# Patient Record
Sex: Female | Born: 1940
Health system: Southern US, Community
[De-identification: ages and names within clinical notes are randomized; demographics above are authoritative.]

## PROBLEM LIST (undated history)

## (undated) DIAGNOSIS — K219 Gastro-esophageal reflux disease without esophagitis: Secondary | ICD-10-CM

## (undated) DIAGNOSIS — J189 Pneumonia, unspecified organism: Secondary | ICD-10-CM

## (undated) DIAGNOSIS — E785 Hyperlipidemia, unspecified: Secondary | ICD-10-CM

## (undated) DIAGNOSIS — M1990 Unspecified osteoarthritis, unspecified site: Secondary | ICD-10-CM

## (undated) DIAGNOSIS — I639 Cerebral infarction, unspecified: Secondary | ICD-10-CM

## (undated) DIAGNOSIS — G4730 Sleep apnea, unspecified: Secondary | ICD-10-CM

## (undated) DIAGNOSIS — R110 Nausea: Secondary | ICD-10-CM

## (undated) DIAGNOSIS — D649 Anemia, unspecified: Secondary | ICD-10-CM

## (undated) DIAGNOSIS — Z87442 Personal history of urinary calculi: Secondary | ICD-10-CM

## (undated) DIAGNOSIS — R1310 Dysphagia, unspecified: Secondary | ICD-10-CM

## (undated) DIAGNOSIS — Z8719 Personal history of other diseases of the digestive system: Secondary | ICD-10-CM

## (undated) DIAGNOSIS — R609 Edema, unspecified: Secondary | ICD-10-CM

## (undated) DIAGNOSIS — I10 Essential (primary) hypertension: Secondary | ICD-10-CM

## (undated) HISTORY — PX: CHOLECYSTECTOMY: SHX55

## (undated) HISTORY — PX: BREAST SURGERY: SHX581

## (undated) HISTORY — PX: CARPAL TUNNEL RELEASE: SHX101

## (undated) HISTORY — PX: ABDOMINAL HYSTERECTOMY: SHX81

## (undated) HISTORY — PX: APPENDECTOMY: SHX54

## (undated) HISTORY — PX: KYPHOPLASTY: SHX5884

## (undated) HISTORY — PX: BREAST BIOPSY: SHX20

## (undated) HISTORY — PX: REDUCTION MAMMAPLASTY: SUR839

## (undated) SURGERY — Surgical Case
Anesthesia: *Unknown

---

## 1998-12-24 ENCOUNTER — Other Ambulatory Visit: Admission: RE | Admit: 1998-12-24 | Discharge: 1998-12-24 | Payer: Self-pay | Admitting: *Deleted

## 1999-01-22 ENCOUNTER — Ambulatory Visit (HOSPITAL_COMMUNITY): Admission: RE | Admit: 1999-01-22 | Discharge: 1999-01-22 | Payer: Self-pay | Admitting: *Deleted

## 1999-04-17 ENCOUNTER — Ambulatory Visit (HOSPITAL_COMMUNITY): Admission: RE | Admit: 1999-04-17 | Discharge: 1999-04-17 | Payer: Self-pay | Admitting: Obstetrics & Gynecology

## 2000-01-21 ENCOUNTER — Other Ambulatory Visit: Admission: RE | Admit: 2000-01-21 | Discharge: 2000-01-21 | Payer: Self-pay | Admitting: *Deleted

## 2001-01-20 ENCOUNTER — Other Ambulatory Visit: Admission: RE | Admit: 2001-01-20 | Discharge: 2001-01-20 | Payer: Self-pay | Admitting: *Deleted

## 2001-01-23 ENCOUNTER — Encounter: Payer: Self-pay | Admitting: *Deleted

## 2001-01-23 ENCOUNTER — Encounter: Admission: RE | Admit: 2001-01-23 | Discharge: 2001-01-23 | Payer: Self-pay | Admitting: *Deleted

## 2002-01-24 ENCOUNTER — Encounter: Admission: RE | Admit: 2002-01-24 | Discharge: 2002-01-24 | Payer: Self-pay | Admitting: *Deleted

## 2002-01-24 ENCOUNTER — Encounter: Payer: Self-pay | Admitting: *Deleted

## 2002-01-30 ENCOUNTER — Other Ambulatory Visit: Admission: RE | Admit: 2002-01-30 | Discharge: 2002-01-30 | Payer: Self-pay | Admitting: *Deleted

## 2003-02-05 ENCOUNTER — Encounter: Admission: RE | Admit: 2003-02-05 | Discharge: 2003-02-05 | Payer: Self-pay | Admitting: Obstetrics & Gynecology

## 2003-02-05 ENCOUNTER — Encounter: Payer: Self-pay | Admitting: Obstetrics & Gynecology

## 2003-02-26 ENCOUNTER — Other Ambulatory Visit: Admission: RE | Admit: 2003-02-26 | Discharge: 2003-02-26 | Payer: Self-pay | Admitting: Obstetrics & Gynecology

## 2004-02-27 ENCOUNTER — Encounter: Admission: RE | Admit: 2004-02-27 | Discharge: 2004-02-27 | Payer: Self-pay | Admitting: Obstetrics & Gynecology

## 2004-03-06 ENCOUNTER — Other Ambulatory Visit: Admission: RE | Admit: 2004-03-06 | Discharge: 2004-03-06 | Payer: Self-pay | Admitting: Obstetrics & Gynecology

## 2005-03-04 ENCOUNTER — Encounter: Admission: RE | Admit: 2005-03-04 | Discharge: 2005-03-04 | Payer: Self-pay | Admitting: Obstetrics & Gynecology

## 2006-03-16 ENCOUNTER — Encounter: Admission: RE | Admit: 2006-03-16 | Discharge: 2006-03-16 | Payer: Self-pay | Admitting: Obstetrics & Gynecology

## 2006-04-29 ENCOUNTER — Ambulatory Visit: Payer: Self-pay | Admitting: Unknown Physician Specialty

## 2007-03-20 ENCOUNTER — Encounter: Admission: RE | Admit: 2007-03-20 | Discharge: 2007-03-20 | Payer: Self-pay | Admitting: Internal Medicine

## 2007-11-17 ENCOUNTER — Ambulatory Visit: Payer: Self-pay | Admitting: Internal Medicine

## 2007-12-14 ENCOUNTER — Ambulatory Visit: Payer: Self-pay | Admitting: Internal Medicine

## 2007-12-15 ENCOUNTER — Ambulatory Visit: Payer: Self-pay | Admitting: Internal Medicine

## 2008-04-22 ENCOUNTER — Ambulatory Visit: Payer: Self-pay | Admitting: Internal Medicine

## 2008-04-22 ENCOUNTER — Inpatient Hospital Stay: Payer: Self-pay | Admitting: Surgery

## 2008-05-07 ENCOUNTER — Encounter: Admission: RE | Admit: 2008-05-07 | Discharge: 2008-05-07 | Payer: Self-pay | Admitting: Obstetrics & Gynecology

## 2009-04-22 ENCOUNTER — Ambulatory Visit: Payer: Self-pay | Admitting: Specialist

## 2009-05-21 ENCOUNTER — Encounter: Admission: RE | Admit: 2009-05-21 | Discharge: 2009-05-21 | Payer: Self-pay | Admitting: Obstetrics & Gynecology

## 2009-06-06 ENCOUNTER — Ambulatory Visit: Payer: Self-pay | Admitting: Specialist

## 2010-04-03 ENCOUNTER — Ambulatory Visit: Payer: Self-pay | Admitting: Internal Medicine

## 2010-04-20 ENCOUNTER — Ambulatory Visit: Payer: Self-pay | Admitting: Unknown Physician Specialty

## 2010-05-13 ENCOUNTER — Ambulatory Visit: Payer: Self-pay | Admitting: Internal Medicine

## 2010-06-02 ENCOUNTER — Encounter: Admission: RE | Admit: 2010-06-02 | Discharge: 2010-06-02 | Payer: Self-pay | Admitting: Obstetrics & Gynecology

## 2010-06-04 ENCOUNTER — Ambulatory Visit: Payer: Self-pay | Admitting: Internal Medicine

## 2010-10-26 ENCOUNTER — Encounter: Payer: Self-pay | Admitting: Obstetrics & Gynecology

## 2011-03-25 ENCOUNTER — Emergency Department: Payer: Self-pay | Admitting: Internal Medicine

## 2011-04-30 ENCOUNTER — Other Ambulatory Visit: Payer: Self-pay | Admitting: Obstetrics & Gynecology

## 2011-04-30 DIAGNOSIS — Z1231 Encounter for screening mammogram for malignant neoplasm of breast: Secondary | ICD-10-CM

## 2011-06-16 ENCOUNTER — Ambulatory Visit: Payer: Self-pay

## 2011-06-18 ENCOUNTER — Ambulatory Visit: Payer: Self-pay

## 2011-07-07 ENCOUNTER — Other Ambulatory Visit: Payer: Self-pay | Admitting: Obstetrics & Gynecology

## 2011-07-07 DIAGNOSIS — N644 Mastodynia: Secondary | ICD-10-CM

## 2011-07-20 ENCOUNTER — Ambulatory Visit
Admission: RE | Admit: 2011-07-20 | Discharge: 2011-07-20 | Disposition: A | Discharge status: Home / Self Care | Payer: Medicare Other | Source: Ambulatory Visit | Attending: Obstetrics & Gynecology | Admitting: Obstetrics & Gynecology

## 2011-07-20 DIAGNOSIS — N644 Mastodynia: Secondary | ICD-10-CM

## 2012-04-25 ENCOUNTER — Ambulatory Visit: Payer: Self-pay | Admitting: Internal Medicine

## 2012-05-11 ENCOUNTER — Other Ambulatory Visit: Payer: Self-pay | Admitting: Internal Medicine

## 2012-05-11 DIAGNOSIS — N63 Unspecified lump in unspecified breast: Secondary | ICD-10-CM

## 2012-05-11 DIAGNOSIS — N6452 Nipple discharge: Secondary | ICD-10-CM

## 2012-05-22 ENCOUNTER — Other Ambulatory Visit: Payer: Medicare Other

## 2012-05-24 ENCOUNTER — Other Ambulatory Visit: Payer: Medicare Other

## 2012-05-24 ENCOUNTER — Ambulatory Visit
Admission: RE | Admit: 2012-05-24 | Discharge: 2012-05-24 | Disposition: A | Discharge status: Home / Self Care | Payer: Medicare Other | Source: Ambulatory Visit | Attending: Internal Medicine | Admitting: Internal Medicine

## 2012-05-24 DIAGNOSIS — N6452 Nipple discharge: Secondary | ICD-10-CM

## 2012-05-24 DIAGNOSIS — N63 Unspecified lump in unspecified breast: Secondary | ICD-10-CM

## 2012-06-20 ENCOUNTER — Other Ambulatory Visit: Payer: Self-pay | Admitting: Obstetrics & Gynecology

## 2012-06-20 DIAGNOSIS — Z1231 Encounter for screening mammogram for malignant neoplasm of breast: Secondary | ICD-10-CM

## 2012-08-02 ENCOUNTER — Ambulatory Visit
Admission: RE | Admit: 2012-08-02 | Discharge: 2012-08-02 | Disposition: A | Discharge status: Home / Self Care | Payer: Medicare Other | Source: Ambulatory Visit | Attending: Obstetrics & Gynecology | Admitting: Obstetrics & Gynecology

## 2012-08-02 DIAGNOSIS — Z1231 Encounter for screening mammogram for malignant neoplasm of breast: Secondary | ICD-10-CM

## 2012-08-03 ENCOUNTER — Other Ambulatory Visit: Payer: Self-pay | Admitting: Obstetrics & Gynecology

## 2012-08-03 DIAGNOSIS — R234 Changes in skin texture: Secondary | ICD-10-CM

## 2012-08-09 ENCOUNTER — Ambulatory Visit
Admission: RE | Admit: 2012-08-09 | Discharge: 2012-08-09 | Disposition: A | Discharge status: Home / Self Care | Payer: Medicare Other | Source: Ambulatory Visit | Attending: Obstetrics & Gynecology | Admitting: Obstetrics & Gynecology

## 2012-08-09 DIAGNOSIS — R234 Changes in skin texture: Secondary | ICD-10-CM

## 2013-02-17 ENCOUNTER — Emergency Department: Payer: Self-pay | Admitting: Emergency Medicine

## 2013-02-17 LAB — CBC
HGB: 13.2 g/dL (ref 12.0–16.0)
MCHC: 33.6 g/dL (ref 32.0–36.0)
Platelet: 211 10*3/uL (ref 150–440)
RDW: 14 % (ref 11.5–14.5)
WBC: 14.4 10*3/uL — ABNORMAL HIGH (ref 3.6–11.0)

## 2013-02-17 LAB — COMPREHENSIVE METABOLIC PANEL
Albumin: 3.6 g/dL (ref 3.4–5.0)
BUN: 12 mg/dL (ref 7–18)
Bilirubin,Total: 0.7 mg/dL (ref 0.2–1.0)
Co2: 27 mmol/L (ref 21–32)
Creatinine: 0.78 mg/dL (ref 0.60–1.30)
EGFR (African American): >60
Osmolality: 287 (ref 275–301)

## 2013-02-17 LAB — URINALYSIS, COMPLETE
Bilirubin,UR: NEGATIVE
Glucose,UR: NEGATIVE mg/dL (ref 0–75)
Protein: NEGATIVE
RBC,UR: 1 /HPF (ref 0–5)
Specific Gravity: 1.004 (ref 1.003–1.030)

## 2013-02-17 LAB — TROPONIN I
Troponin-I: <0.02 ng/mL
Troponin-I: <0.02 ng/mL

## 2013-07-10 ENCOUNTER — Other Ambulatory Visit: Payer: Self-pay

## 2013-07-10 DIAGNOSIS — Z1231 Encounter for screening mammogram for malignant neoplasm of breast: Secondary | ICD-10-CM

## 2013-08-13 ENCOUNTER — Ambulatory Visit
Admission: RE | Admit: 2013-08-13 | Discharge: 2013-08-13 | Disposition: A | Discharge status: Home / Self Care | Payer: Medicare Other | Source: Ambulatory Visit

## 2013-08-13 DIAGNOSIS — Z1231 Encounter for screening mammogram for malignant neoplasm of breast: Secondary | ICD-10-CM

## 2013-12-13 ENCOUNTER — Ambulatory Visit: Payer: Self-pay | Admitting: Internal Medicine

## 2014-01-28 ENCOUNTER — Ambulatory Visit: Payer: Self-pay | Admitting: Unknown Physician Specialty

## 2014-01-30 LAB — PATHOLOGY REPORT

## 2014-05-30 DIAGNOSIS — M810 Age-related osteoporosis without current pathological fracture: Secondary | ICD-10-CM | POA: Insufficient documentation

## 2014-10-21 ENCOUNTER — Other Ambulatory Visit: Payer: Self-pay

## 2014-10-21 DIAGNOSIS — Z1231 Encounter for screening mammogram for malignant neoplasm of breast: Secondary | ICD-10-CM

## 2014-10-25 ENCOUNTER — Ambulatory Visit: Payer: Medicare Other

## 2015-08-11 ENCOUNTER — Other Ambulatory Visit: Payer: Self-pay | Admitting: Internal Medicine

## 2015-08-11 DIAGNOSIS — D500 Iron deficiency anemia secondary to blood loss (chronic): Secondary | ICD-10-CM

## 2015-08-11 DIAGNOSIS — R222 Localized swelling, mass and lump, trunk: Secondary | ICD-10-CM

## 2015-08-14 ENCOUNTER — Ambulatory Visit
Admission: RE | Admit: 2015-08-14 | Discharge: 2015-08-14 | Disposition: A | Discharge status: Home / Self Care | Payer: Medicare Other | Source: Ambulatory Visit | Attending: Internal Medicine | Admitting: Internal Medicine

## 2015-08-14 DIAGNOSIS — D500 Iron deficiency anemia secondary to blood loss (chronic): Secondary | ICD-10-CM | POA: Diagnosis present

## 2015-08-14 DIAGNOSIS — R222 Localized swelling, mass and lump, trunk: Secondary | ICD-10-CM | POA: Diagnosis present

## 2015-08-14 DIAGNOSIS — K449 Diaphragmatic hernia without obstruction or gangrene: Secondary | ICD-10-CM | POA: Insufficient documentation

## 2015-08-14 DIAGNOSIS — I728 Aneurysm of other specified arteries: Secondary | ICD-10-CM | POA: Insufficient documentation

## 2015-08-14 DIAGNOSIS — I2510 Atherosclerotic heart disease of native coronary artery without angina pectoris: Secondary | ICD-10-CM | POA: Insufficient documentation

## 2015-08-14 HISTORY — DX: Essential (primary) hypertension: I10

## 2015-08-14 MED ORDER — IOHEXOL 300 MG/ML  SOLN
75.0000 mL | Freq: Once | INTRAMUSCULAR | Status: AC | PRN
  Administered 2015-08-14: 75 mL via INTRAVENOUS

## 2015-08-27 ENCOUNTER — Encounter: Payer: Self-pay | Admitting: *Deleted

## 2015-09-01 ENCOUNTER — Encounter
Admission: RE | Disposition: A | Discharge status: Home / Self Care | Payer: Self-pay | Source: Ambulatory Visit | Attending: Unknown Physician Specialty

## 2015-09-01 ENCOUNTER — Encounter: Payer: Self-pay | Admitting: *Deleted

## 2015-09-01 ENCOUNTER — Ambulatory Visit
Admission: RE | Admit: 2015-09-01 | Discharge: 2015-09-01 | Disposition: A | Discharge status: Home / Self Care | Payer: Medicare Other | Source: Ambulatory Visit | Attending: Unknown Physician Specialty | Admitting: Unknown Physician Specialty

## 2015-09-01 ENCOUNTER — Ambulatory Visit: Payer: Medicare Other | Admitting: Anesthesiology

## 2015-09-01 DIAGNOSIS — Z823 Family history of stroke: Secondary | ICD-10-CM | POA: Diagnosis not present

## 2015-09-01 DIAGNOSIS — M1710 Unilateral primary osteoarthritis, unspecified knee: Secondary | ICD-10-CM | POA: Insufficient documentation

## 2015-09-01 DIAGNOSIS — K2900 Acute gastritis without bleeding: Secondary | ICD-10-CM | POA: Insufficient documentation

## 2015-09-01 DIAGNOSIS — Z87442 Personal history of urinary calculi: Secondary | ICD-10-CM | POA: Diagnosis not present

## 2015-09-01 DIAGNOSIS — M810 Age-related osteoporosis without current pathological fracture: Secondary | ICD-10-CM | POA: Insufficient documentation

## 2015-09-01 DIAGNOSIS — D509 Iron deficiency anemia, unspecified: Secondary | ICD-10-CM | POA: Diagnosis present

## 2015-09-01 DIAGNOSIS — K210 Gastro-esophageal reflux disease with esophagitis: Secondary | ICD-10-CM | POA: Insufficient documentation

## 2015-09-01 DIAGNOSIS — Z880 Allergy status to penicillin: Secondary | ICD-10-CM | POA: Diagnosis not present

## 2015-09-01 DIAGNOSIS — Z818 Family history of other mental and behavioral disorders: Secondary | ICD-10-CM | POA: Insufficient documentation

## 2015-09-01 DIAGNOSIS — R1310 Dysphagia, unspecified: Secondary | ICD-10-CM | POA: Diagnosis present

## 2015-09-01 DIAGNOSIS — E785 Hyperlipidemia, unspecified: Secondary | ICD-10-CM | POA: Insufficient documentation

## 2015-09-01 DIAGNOSIS — Z9049 Acquired absence of other specified parts of digestive tract: Secondary | ICD-10-CM | POA: Insufficient documentation

## 2015-09-01 DIAGNOSIS — Z79899 Other long term (current) drug therapy: Secondary | ICD-10-CM | POA: Diagnosis not present

## 2015-09-01 DIAGNOSIS — K2950 Unspecified chronic gastritis without bleeding: Secondary | ICD-10-CM | POA: Diagnosis not present

## 2015-09-01 DIAGNOSIS — Z9889 Other specified postprocedural states: Secondary | ICD-10-CM | POA: Diagnosis not present

## 2015-09-01 DIAGNOSIS — K449 Diaphragmatic hernia without obstruction or gangrene: Secondary | ICD-10-CM | POA: Insufficient documentation

## 2015-09-01 DIAGNOSIS — Z888 Allergy status to other drugs, medicaments and biological substances status: Secondary | ICD-10-CM | POA: Diagnosis not present

## 2015-09-01 DIAGNOSIS — K222 Esophageal obstruction: Secondary | ICD-10-CM | POA: Insufficient documentation

## 2015-09-01 DIAGNOSIS — Z8042 Family history of malignant neoplasm of prostate: Secondary | ICD-10-CM | POA: Diagnosis not present

## 2015-09-01 DIAGNOSIS — Z8262 Family history of osteoporosis: Secondary | ICD-10-CM | POA: Diagnosis not present

## 2015-09-01 DIAGNOSIS — Z90710 Acquired absence of both cervix and uterus: Secondary | ICD-10-CM | POA: Diagnosis not present

## 2015-09-01 DIAGNOSIS — Z882 Allergy status to sulfonamides status: Secondary | ICD-10-CM | POA: Insufficient documentation

## 2015-09-01 DIAGNOSIS — M1990 Unspecified osteoarthritis, unspecified site: Secondary | ICD-10-CM | POA: Diagnosis not present

## 2015-09-01 DIAGNOSIS — I10 Essential (primary) hypertension: Secondary | ICD-10-CM | POA: Diagnosis not present

## 2015-09-01 DIAGNOSIS — J302 Other seasonal allergic rhinitis: Secondary | ICD-10-CM | POA: Diagnosis not present

## 2015-09-01 DIAGNOSIS — R110 Nausea: Secondary | ICD-10-CM | POA: Diagnosis present

## 2015-09-01 HISTORY — DX: Nausea: R11.0

## 2015-09-01 HISTORY — DX: Unspecified osteoarthritis, unspecified site: M19.90

## 2015-09-01 HISTORY — DX: Personal history of other diseases of the digestive system: Z87.19

## 2015-09-01 HISTORY — DX: Gastro-esophageal reflux disease without esophagitis: K21.9

## 2015-09-01 HISTORY — DX: Dysphagia, unspecified: R13.10

## 2015-09-01 HISTORY — DX: Hyperlipidemia, unspecified: E78.5

## 2015-09-01 HISTORY — PX: COLONOSCOPY WITH PROPOFOL: SHX5780

## 2015-09-01 HISTORY — PX: SAVORY DILATION: SHX5439

## 2015-09-01 SURGERY — EGD, WITH DILATION USING SAVARY-GILLIARD DILATOR OVER GUIDEWIRE
Anesthesia: General

## 2015-09-01 MED ORDER — FENTANYL CITRATE (PF) 100 MCG/2ML IJ SOLN
INTRAMUSCULAR | Status: DC | PRN
  Administered 2015-09-01: 50 ug via INTRAVENOUS

## 2015-09-01 MED ORDER — SODIUM CHLORIDE 0.9 % IV SOLN: INTRAVENOUS | Status: DC

## 2015-09-01 MED ORDER — PROPOFOL 500 MG/50ML IV EMUL
INTRAVENOUS | Status: DC | PRN
  Administered 2015-09-01: 125 ug/kg/min via INTRAVENOUS

## 2015-09-01 MED ORDER — GLYCOPYRROLATE 0.2 MG/ML IJ SOLN
INTRAMUSCULAR | Status: DC | PRN
  Administered 2015-09-01: 0.2 mg via INTRAVENOUS

## 2015-09-01 MED ORDER — SODIUM CHLORIDE 0.9 % IV SOLN
INTRAVENOUS | Status: DC
  Administered 2015-09-01: 1000 mL via INTRAVENOUS

## 2015-09-01 MED ORDER — BUTAMBEN-TETRACAINE-BENZOCAINE 2-2-14 % EX AERO
INHALATION_SPRAY | CUTANEOUS | Status: DC | PRN
  Administered 2015-09-01: 1 via TOPICAL

## 2015-09-01 MED ORDER — PROPOFOL 10 MG/ML IV BOLUS
INTRAVENOUS | Status: DC | PRN
  Administered 2015-09-01: 80 mg via INTRAVENOUS

## 2015-09-01 NOTE — Op Note (Signed)
[facility] Patient Name: Cheryl CarwinBeatrice Montee Procedure Date: 09/01/2015 10:48 AM MRN: [id number] Account #: [id number] Date of Birth: 01/22/1941 Admit Type: Outpatient Age: [age] [facility]: [facility] Gender: Female Note Status: Finalized Procedure:         Upper GI endoscopy Indications:       Dysphagia Providers:         Scot Junobert T. Sylvan Sookdeo, MD Referring MD:      Danella PentonMark F. Miller, MD (Referring MD) Medicines:         Propofol per Anesthesia Complications:     No immediate complications. Procedure:         Pre-Anesthesia Assessment:                    - After reviewing the risks and benefits, the patient was                     deemed in satisfactory condition to undergo the procedure.                    After obtaining informed consent, the endoscope was passed                     under direct vision. Throughout the procedure, the                     patient's blood pressure, pulse, and oxygen saturations                     were monitored continuously. The Endoscope was introduced                     through the mouth, and advanced to the second part of                     duodenum. The upper GI endoscopy was accomplished without                     difficulty. The patient tolerated the procedure well. Findings:      A mild Schatzki ring (acquired) was found at the gastroesophageal       junction. A guidewire was placed and the scope was withdrawn. Dilation       was performed with a Savary dilator with mild resistance at 16 mm. GEJ       33cm.      A medium-sized hiatus hernia was present.      Patchy mild inflammation characterized by erythema and granularity was       found in the gastric antrum. Biopsies were taken with a cold forceps for       histology. Biopsies were taken with a cold forceps for Helicobacter       pylori testing.      The examined duodenum was normal. Impression:        - Mild Schatzki ring. Dilated.                    -  Medium-sized hiatus hernia.                    - Gastritis. Biopsied.                    - Normal examined duodenum. Recommendation:    - Await pathology results.                    -  soft food for 3 days, eat slowly, chew well, take small                     bites Scot Jun, MD 09/01/2015 11:01:14 AM This report has been signed electronically. Number of Addenda: 0 Note Initiated On: 09/01/2015 10:48 AM      [facility]

## 2015-09-01 NOTE — Anesthesia Preprocedure Evaluation (Signed)
Anesthesia Evaluation  Patient identified by MRN, date of birth, ID band Patient awake    Reviewed: Allergy & Precautions, H&P , NPO status , Patient's Chart, lab work & pertinent test results  History of Anesthesia Complications Negative for: history of anesthetic complications  Airway Mallampati: II  TM Distance: >3 FB Neck ROM: full    Dental no notable dental hx. (+) Teeth Intact   Pulmonary neg pulmonary ROS, neg shortness of breath,    Pulmonary exam normal breath sounds clear to auscultation       Cardiovascular Exercise Tolerance: Good hypertension, (-) angina(-) Past MI and (-) DOE Normal cardiovascular exam Rhythm:regular Rate:Normal     Neuro/Psych negative neurological ROS  negative psych ROS   GI/Hepatic Neg liver ROS, hiatal hernia, GERD  Controlled,  Endo/Other  negative endocrine ROS  Renal/GU negative Renal ROS  negative genitourinary   Musculoskeletal   Abdominal   Peds  Hematology negative hematology ROS (+)   Anesthesia Other Findings Past Medical History:   Hypertension                                                 Dysphagia                                                    Nausea                                                       GERD (gastroesophageal reflux disease)                       Hyperlipidemia                                               Arthritis                                                    History of hiatal hernia                                    Past Surgical History:   ABDOMINAL HYSTERECTOMY                                        APPENDECTOMY                                                  CHOLECYSTECTOMY  BREAST SURGERY                                                CARPAL TUNNEL RELEASE                                        BMI    Body Mass Index   34.31 kg/m 2    Patient reports that she does that think  that any food or pills are stuck in her throat at this time.   Signs and symptoms suggestive of sleep apnea    Reproductive/Obstetrics negative OB ROS                             Anesthesia Physical Anesthesia Plan  ASA: III  Anesthesia Plan: General   Post-op Pain Management:    Induction:   Airway Management Planned:   Additional Equipment:   Intra-op Plan:   Post-operative Plan:   Informed Consent: I have reviewed the patients History and Physical, chart, labs and discussed the procedure including the risks, benefits and alternatives for the proposed anesthesia with the patient or authorized representative who has indicated his/her understanding and acceptance.   Dental Advisory Given  Plan Discussed with: Anesthesiologist, CRNA and Surgeon  Anesthesia Plan Comments:         Anesthesia Quick Evaluation

## 2015-09-01 NOTE — Transfer of Care (Signed)
Immediate Anesthesia Transfer of Care Note  Patient: Cheryl Goodman  Procedure(s) Performed: Procedure(s): SAVORY DILATION (N/A) COLONOSCOPY WITH PROPOFOL (N/A)  Patient Location: PACU  Anesthesia Type:General  Level of Consciousness: awake, alert , oriented and patient cooperative  Airway & Oxygen Therapy: Patient Spontanous Breathing and Patient connected to nasal cannula oxygen  Post-op Assessment: Report given to RN, Post -op Vital signs reviewed and stable and Patient moving all extremities  Post vital signs: Reviewed and stable  Last Vitals:  Filed Vitals:   09/01/15 1013 09/01/15 1105  BP: 144/89 127/76  Pulse: 77 78  Temp: 36.6 C 36.4 C  Resp: 18 20    Complications: No apparent anesthesia complications

## 2015-09-01 NOTE — H&P (Signed)
   Primary Care Physician:  MILLER,MARK F., MD Primary Gastroenterologist:  Dr. Mechele CollDanella PentoninElliott  Pre-Procedure History & Physical: HPI:  Cheryl Goodman is a 74 y.o. female is here for an endoscopy.   Past Medical History  Diagnosis Date  . Hypertension   . Dysphagia   . Nausea   . GERD (gastroesophageal reflux disease)   . Hyperlipidemia   . Arthritis   . History of hiatal hernia     Past Surgical History  Procedure Laterality Date  . Abdominal hysterectomy    . Appendectomy    . Cholecystectomy    . Breast surgery    . Carpal tunnel release      Prior to Admission medications   Medication Sig Start Date End Date Taking? Authorizing Provider  Biotin 1000 MCG tablet Take 1,000 mcg by mouth daily.   Yes Historical Provider, MD  cyanocobalamin 1000 MCG tablet Take 3,000 mcg by mouth daily.   Yes Historical Provider, MD  ferrous fumarate (HEMOCYTE - 106 MG FE) 325 (106 FE) MG TABS tablet Take 1 tablet by mouth daily.   Yes Historical Provider, MD  lisinopril-hydrochlorothiazide (PRINZIDE,ZESTORETIC) 20-12.5 MG tablet Take 1 tablet by mouth daily.   Yes Historical Provider, MD  pantoprazole (PROTONIX) 40 MG tablet Take 40 mg by mouth daily.   Yes Historical Provider, MD  ranitidine (ZANTAC) 150 MG capsule Take 150 mg by mouth daily.   Yes Historical Provider, MD  traMADol (ULTRAM) 50 MG tablet Take 50 mg by mouth 2 (two) times daily as needed.   Yes Historical Provider, MD  zoledronic acid (RECLAST) 5 MG/100ML SOLN injection Inject 5 mg into the vein See admin instructions.   Yes Historical Provider, MD    Allergies as of 08/03/2015  . (No Known Allergies)    History reviewed. No pertinent family history.  Social History   Social History  . Marital Status: Married    Spouse Name: N/A  . Number of Children: N/A  . Years of Education: N/A   Occupational History  . Not on file.   Social History Main Topics  . Smoking status: Never Smoker   . Smokeless tobacco: Never Used   . Alcohol Use: No  . Drug Use: No  . Sexual Activity: Not on file   Other Topics Concern  . Not on file   Social History Narrative    Review of Systems: See HPI, otherwise negative ROS  Physical Exam: BP 144/89 mmHg  Pulse 77  Temp(Src) 97.8 F (36.6 C) (Oral)  Resp 18  Ht 4\' 11"  (1.499 m)  Wt 77.111 kg (170 lb)  BMI 34.32 kg/m2  SpO2 99% General:   Alert,  pleasant and cooperative in NAD Head:  Normocephalic and atraumatic. Neck:  Supple; no masses or thyromegaly. Lungs:  Clear throughout to auscultation.    Heart:  Regular rate and rhythm. Abdomen:  Soft, nontender and nondistended. Normal bowel sounds, without guarding, and without rebound.   Neurologic:  Alert and  oriented x4;  grossly normal neurologically.  Impression/Plan: Cheryl Goodman is here for an endoscopy to be performed for dysphagia  Risks, benefits, limitations, and alternatives regarding  endoscopy have been reviewed with the patient.  Questions have been answered.  All parties agreeable.   Lynnae PrudeELLIOTT, Jemimah Cressy, MD  09/01/2015, 10:44 AM

## 2015-09-01 NOTE — Anesthesia Postprocedure Evaluation (Signed)
Anesthesia Post Note  Patient: Cheryl Goodman  Procedure(s) Performed: Procedure(s) (LRB): SAVORY DILATION (N/A) COLONOSCOPY WITH PROPOFOL (N/A)  Patient location during evaluation: Endoscopy Anesthesia Type: General Level of consciousness: awake and alert Pain management: pain level controlled Vital Signs Assessment: post-procedure vital signs reviewed and stable Respiratory status: spontaneous breathing, nonlabored ventilation, respiratory function stable and patient connected to nasal cannula oxygen Cardiovascular status: blood pressure returned to baseline and stable Postop Assessment: No signs of nausea or vomiting Anesthetic complications: no    Last Vitals:  Filed Vitals:   09/01/15 1125 09/01/15 1135  BP: 137/94 132/79  Pulse: 100 80  Temp:    Resp: 14 17    Last Pain: There were no vitals filed for this visit.               Cleda MccreedyJoseph K Piscitello

## 2015-09-02 LAB — SURGICAL PATHOLOGY

## 2015-09-03 ENCOUNTER — Encounter: Payer: Self-pay | Admitting: Unknown Physician Specialty

## 2016-02-26 ENCOUNTER — Other Ambulatory Visit: Payer: Self-pay | Admitting: Obstetrics & Gynecology

## 2016-02-26 DIAGNOSIS — N644 Mastodynia: Secondary | ICD-10-CM

## 2016-02-26 DIAGNOSIS — N63 Unspecified lump in unspecified breast: Secondary | ICD-10-CM

## 2016-03-04 ENCOUNTER — Ambulatory Visit
Admission: RE | Admit: 2016-03-04 | Discharge: 2016-03-04 | Disposition: A | Discharge status: Home / Self Care | Payer: Medicare Other | Source: Ambulatory Visit | Attending: Obstetrics & Gynecology | Admitting: Obstetrics & Gynecology

## 2016-03-04 DIAGNOSIS — N644 Mastodynia: Secondary | ICD-10-CM

## 2016-03-04 DIAGNOSIS — N63 Unspecified lump in unspecified breast: Secondary | ICD-10-CM

## 2016-06-03 DIAGNOSIS — D500 Iron deficiency anemia secondary to blood loss (chronic): Secondary | ICD-10-CM | POA: Insufficient documentation

## 2017-05-12 IMAGING — CT CT CHEST W/ CM
2 of 3 series · 15 of 36 positions shown, 18 images · IV contrast (omnipaque)
Comparison: Chest radiograph - 02/17/2013

CLINICAL DATA: Left supraclavicular mass for the past 1-2 months.
Patient's palpable area of concern marked with a vitamin tablet.

EXAM:
CT CHEST WITH CONTRAST
TECHNIQUE: Multidetector CT imaging of the chest was performed during
intravenous contrast administration.
CONTRAST:  75mL OMNIPAQUE IOHEXOL 300 MG/ML  SOLN

[Series 2: routine chest with · axial · 0.67mm/px · z∈[-688,-403]mm · 12 of 67 slices shown, 15 images]
[im 5/67  mediastinal]
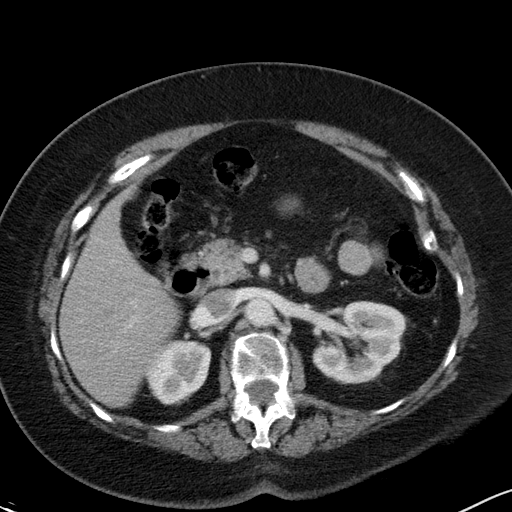
[im 5/67  lung]
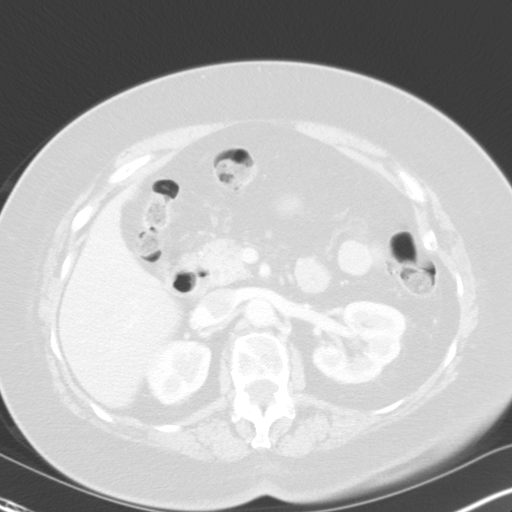
[im 10/67  lung]
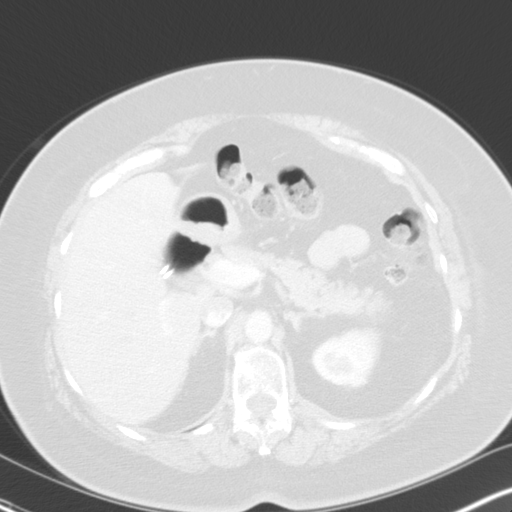
[im 15/67  lung]
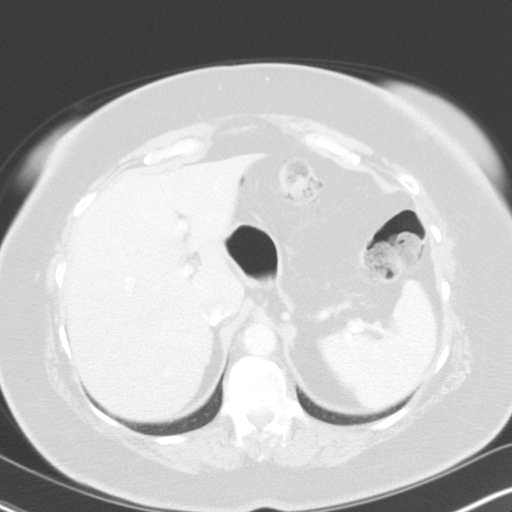
[im 20/67  lung]
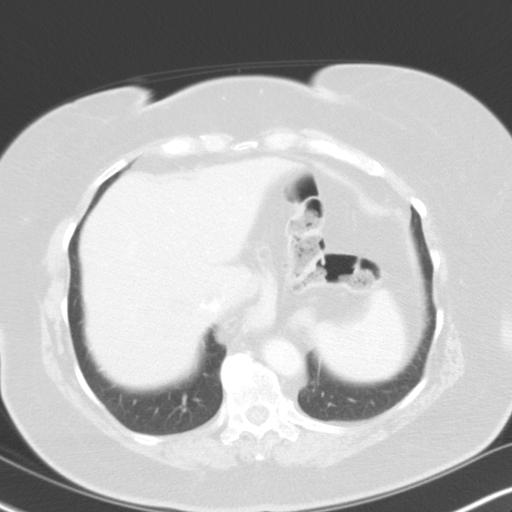
[im 25/67  mediastinal]
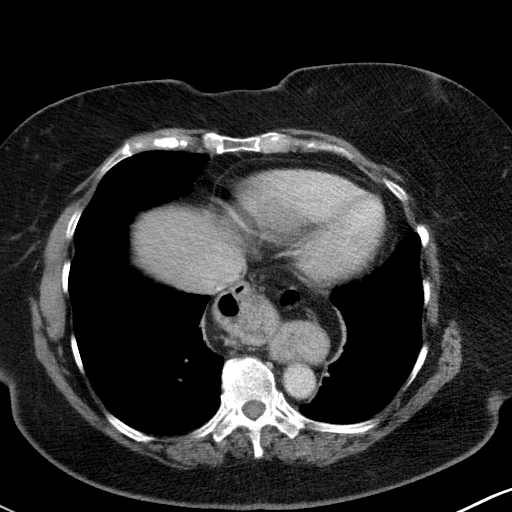
[im 25/67  lung]
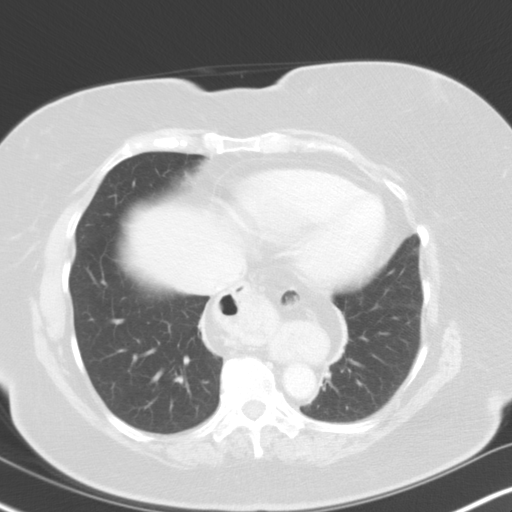
[im 30/67  lung]
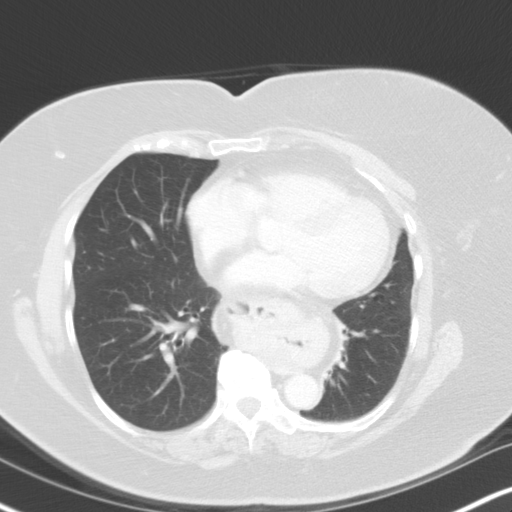
[im 37/67  lung]
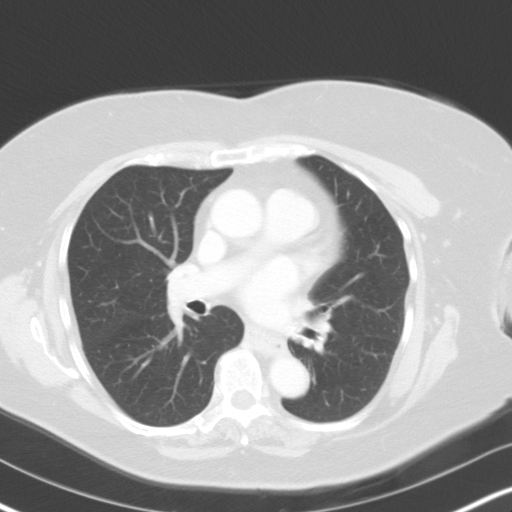
[im 42/67  lung]
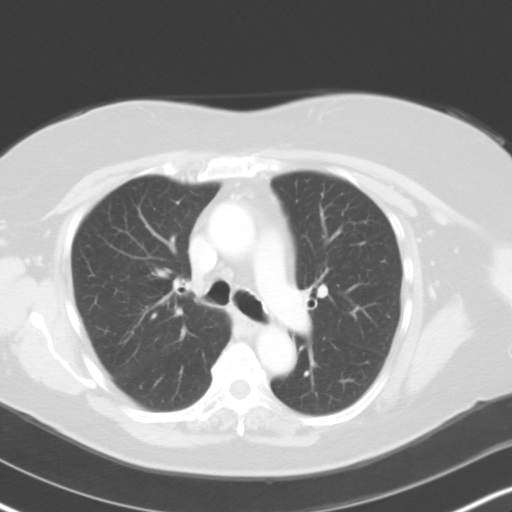
[im 47/67  mediastinal]
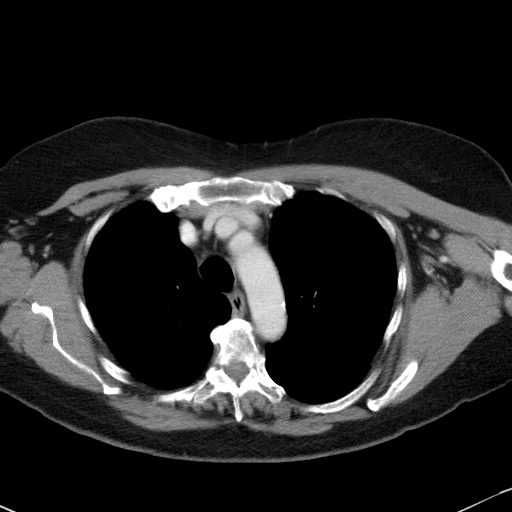
[im 47/67  lung]
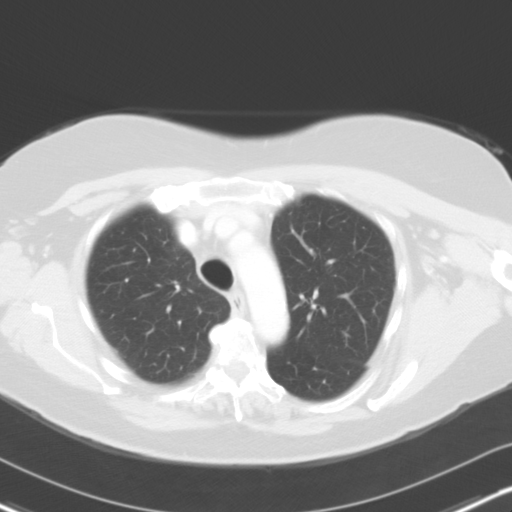
[im 52/67  lung]
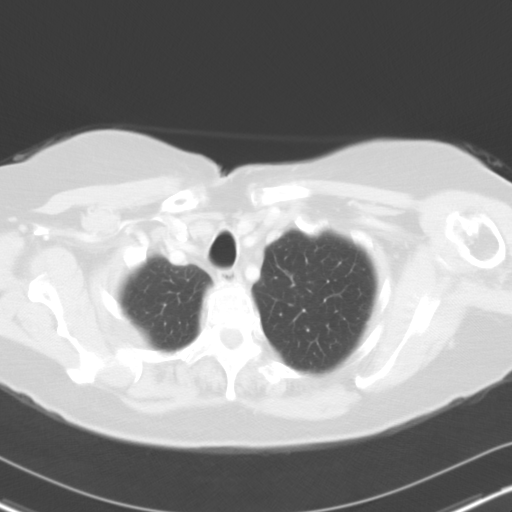
[im 57/67  lung]
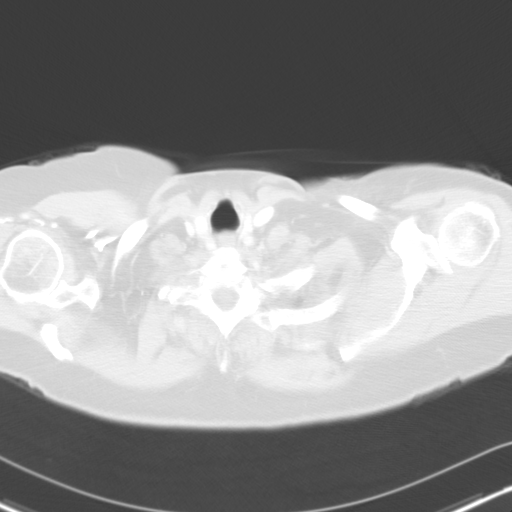
[im 62/67  lung]
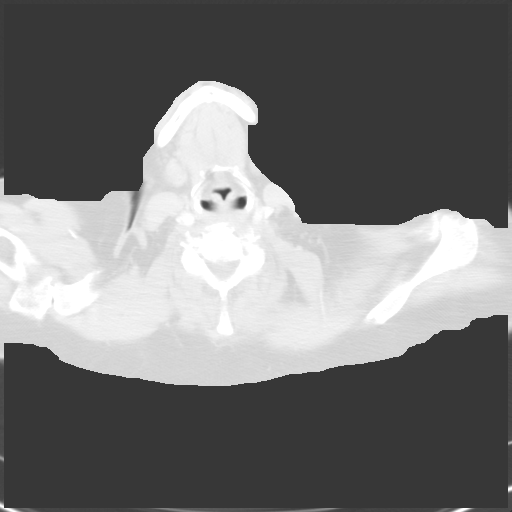

[Series 5: cor routine chest with · coronal · 0.66mm/px · 3 of 147 slices shown]
[im 30/147  lung]
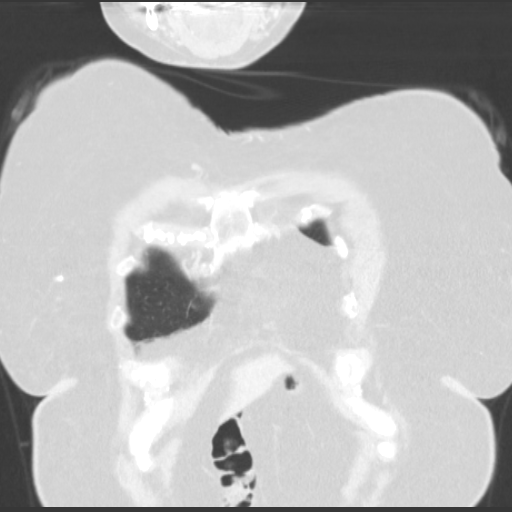
[im 59/147  lung]
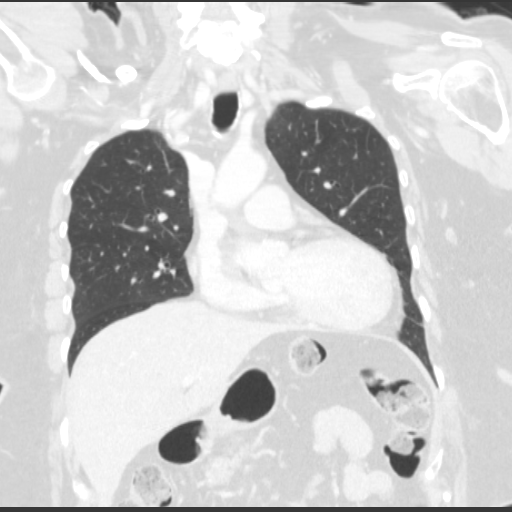
[im 88/147  lung]
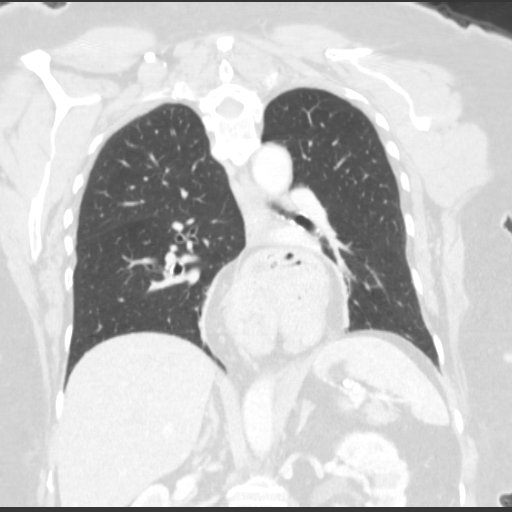

[15 of 36 positions shown; findings below may reference images not displayed]

FINDINGS: There is no CT correlate for patient's palpable area of concern
involving the left supraclavicular fossa with special attention paid
to the area demarcated by the vitamin tablet (representative image
2, series 2). Note is made of several scattered benign-appearing
adjacent left supraclavicular lymph nodes none of which are enlarged
by size criteria with index supraclavicular lymph node measuring
cm in greatest short axis diameter (image 4, series 2).

Regional soft tissues are normal. Normal appearance of the thyroid
gland.

No focal airspace opacities. No pleural effusion or pneumothorax.
The central pulmonary airways are widely patent.

No discrete pulmonary nodule. Scattered shotty mediastinal lymph
nodes are individually not enlarged by size criteria with index
precarinal lymph node measuring 0.6 cm in greatest short axis
diameter (image 27, series 2). No bulky mediastinal, hilar axillary
lymphadenopathy.

Normal heart size.  Coronary artery calcifications.

Although this examination was not tailored for the evaluation the
pulmonary arteries, there are no discrete filling defects within the
central pulmonary arterial tree to suggest central pulmonary
embolism. Normal caliber the main pulmonary artery.

Scattered atherosclerotic plaque within a normal caliber thoracic
aorta. Conventional configuration of the aortic arch.

Moderate to large-sized hiatal hernia. Note is made of an
approximately 1.1 x 1.0 cm peripherally calcified structure about
the splenic hilum (coronal image 78, series 5) which is unchanged
since the [DATE] abdominal CT and again favored to represent a
splenic artery aneurysm which given stability is of doubtful
clinical concern in this postmenopausal patient. Post
cholecystectomy.

No acute or aggressive osseous abnormalities. Moderate
(approximately 50%) compression deformity involving the superior
endplate of the T12 vertebral body with associated focal kyphosis at
this location and mild (approximately 5 mm) of retropulsion
involving the superior endplate.
IMPRESSION: 1. No CT correlate for patient's palpable area of concern involving
the left supraclavicular fossa with special attention paid to the
area demarcated by the vitamin tablet.
2. Coronary artery calcifications.
3. Unchanged approximately 1.1 cm peripherally calcified splenic
artery aneurysm, stable since the [DATE] abdominal CT and of
doubtful clinical concern in this postmenopausal patient.
4. Unchanged moderate (approximately 50%) compression deformity
involving superior endplate of the T12 vertebral body.
5. Moderate to large sized hiatal hernia.

## 2017-06-23 ENCOUNTER — Other Ambulatory Visit: Payer: Self-pay | Admitting: Internal Medicine

## 2017-06-23 DIAGNOSIS — I6523 Occlusion and stenosis of bilateral carotid arteries: Secondary | ICD-10-CM

## 2017-06-29 ENCOUNTER — Ambulatory Visit
Admission: RE | Admit: 2017-06-29 | Discharge: 2017-06-29 | Disposition: A | Discharge status: Home / Self Care | Payer: Medicare Other | Source: Ambulatory Visit | Attending: Internal Medicine | Admitting: Internal Medicine

## 2017-06-29 DIAGNOSIS — I6523 Occlusion and stenosis of bilateral carotid arteries: Secondary | ICD-10-CM | POA: Insufficient documentation

## 2017-09-11 ENCOUNTER — Ambulatory Visit: Payer: Self-pay | Admitting: Orthopedic Surgery

## 2017-10-18 DIAGNOSIS — M1711 Unilateral primary osteoarthritis, right knee: Secondary | ICD-10-CM | POA: Insufficient documentation

## 2017-10-20 ENCOUNTER — Other Ambulatory Visit (HOSPITAL_COMMUNITY): Payer: Self-pay | Admitting: Emergency Medicine

## 2017-10-20 NOTE — Patient Instructions (Addendum)
Cheryl Goodman  10/20/2017   Your procedure is scheduled on: 10-31-17    Report to [facility] Main  Entrance    Report to admitting at 10:00AM   Call this number if you have problems the morning of surgery [phone]     Remember: Do not eat food or drink liquids :After Midnight.     Take these medicines the morning of surgery with A SIP OF WATER: carafate (sucralfate)                                 You may not have any metal on your body including hair pins and              piercings  Do not wear jewelry, make-up, lotions, powders or perfumes, deodorant             Do not wear nail polish.  Do not shave  48 hours prior to surgery.              Do not bring valuables to the hospital. [facility] IS NOT             RESPONSIBLE   FOR VALUABLES.  Contacts, dentures or bridgework may not be worn into surgery.  Leave suitcase in the car. After surgery it may be brought to your room.                 Please read over the following fact sheets you were given: _____________________________________________________________________             [facility] - Preparing for [facility] Before surgery, you can play an important role.  Because skin is not sterile, your skin needs to be as free of germs as possible.  You can reduce the number of germs on your skin by washing with CHG (chlorahexidine gluconate) soap before surgery.  CHG is an antiseptic cleaner which kills germs and bonds with the skin to continue killing germs even after washing. Please DO NOT use if you have an allergy to CHG or antibacterial soaps.  If your skin becomes reddened/irritated stop using the CHG and inform your nurse when you arrive at Short Stay. Do not shave (including legs and underarms) for at least 48 hours prior to the first CHG shower.  You may shave your face/neck. Please follow these instructions carefully:  1.  Shower with CHG Soap the night before surgery and the  morning of  Surgery.  2.  If you choose to wash your hair, wash your hair first as usual with your  normal  shampoo.  3.  After you shampoo, rinse your hair and body thoroughly to remove the  shampoo.                           4.  Use CHG as you would any other liquid soap.  You can apply chg directly  to the skin and wash                       Gently with a scrungie or clean washcloth.  5.  Apply the CHG Soap to your body ONLY FROM THE NECK DOWN.   Do not use on face/ open  Wound or open sores. Avoid contact with eyes, ears mouth and genitals (private parts).                       Wash face,  Genitals (private parts) with your normal soap.             6.  Wash thoroughly, paying special attention to the area where your surgery  will be performed.  7.  Thoroughly rinse your body with warm water from the neck down.  8.  DO NOT shower/wash with your normal soap after using and rinsing off  the CHG Soap.                9.  Pat yourself dry with a clean towel.            10.  Wear clean pajamas.            11.  Place clean sheets on your bed the night of your first shower and do not  sleep with pets. Day of Surgery : Do not apply any lotions/deodorants the morning of surgery.  Please wear clean clothes to the hospital/surgery center.  FAILURE TO FOLLOW THESE INSTRUCTIONS MAY RESULT IN THE CANCELLATION OF YOUR SURGERY PATIENT SIGNATURE_________________________________  NURSE SIGNATURE__________________________________  ________________________________________________________________________   Adam Phenix  An incentive spirometer is a tool that can help keep your lungs clear and active. This tool measures how well you are filling your lungs with each breath. Taking long deep breaths may help reverse or decrease the chance of developing breathing (pulmonary) problems (especially infection) following:  A long period of time when you are unable to move or be active. BEFORE  THE PROCEDURE   If the spirometer includes an indicator to show your best effort, your nurse or respiratory therapist will set it to a desired goal.  If possible, sit up straight or lean slightly forward. Try not to slouch.  Hold the incentive spirometer in an upright position. INSTRUCTIONS FOR USE  1. Sit on the edge of your bed if possible, or sit up as far as you can in bed or on a chair. 2. Hold the incentive spirometer in an upright position. 3. Breathe out normally. 4. Place the mouthpiece in your mouth and seal your lips tightly around it. 5. Breathe in slowly and as deeply as possible, raising the piston or the ball toward the top of the column. 6. Hold your breath for 3-5 seconds or for as long as possible. Allow the piston or ball to fall to the bottom of the column. 7. Remove the mouthpiece from your mouth and breathe out normally. 8. Rest for a few seconds and repeat Steps 1 through 7 at least 10 times every 1-2 hours when you are awake. Take your time and take a few normal breaths between deep breaths. 9. The spirometer may include an indicator to show your best effort. Use the indicator as a goal to work toward during each repetition. 10. After each set of 10 deep breaths, practice coughing to be sure your lungs are clear. If you have an incision (the cut made at the time of surgery), support your incision when coughing by placing a pillow or rolled up towels firmly against it. Once you are able to get out of bed, walk around indoors and cough well. You may stop using the incentive spirometer when instructed by your caregiver.  RISKS AND COMPLICATIONS  Take your time so you do not get  dizzy or light-headed.  If you are in pain, you may need to take or ask for pain medication before doing incentive spirometry. It is harder to take a deep breath if you are having pain. AFTER USE  Rest and breathe slowly and easily.  It can be helpful to keep track of a log of your progress.  Your caregiver can provide you with a simple table to help with this. If you are using the spirometer at home, follow these instructions: [place] IF:   You are having difficultly using the spirometer.  You have trouble using the spirometer as often as instructed.  Your pain medication is not giving enough relief while using the spirometer.  You develop fever of 100.5 F (38.1 C) or higher. SEEK IMMEDIATE MEDICAL CARE IF:   You cough up bloody sputum that had not been present before.  You develop fever of 102 F (38.9 C) or greater.  You develop worsening pain at or near the incision site. MAKE SURE YOU:   Understand these instructions.  Will watch your condition.  Will get help right away if you are not doing well or get worse. Document Released: 01/31/2007 Document Revised: 12/13/2011 Document Reviewed: 04/03/2007 ExitCare Patient Information 2014 Exit[place], [place].   ________________________________________________________________________  WHAT IS A BLOOD TRANSFUSION? Blood Transfusion Information  A transfusion is the replacement of blood or some of its parts. Blood is made up of multiple cells which provide different functions.  Red blood cells carry oxygen and are used for blood loss replacement.  White blood cells fight against infection.  Platelets control bleeding.  Plasma helps clot blood.  Other blood products are available for specialized needs, such as hemophilia or other clotting disorders. BEFORE THE TRANSFUSION  Who gives blood for transfusions?   Healthy volunteers who are fully evaluated to make sure their blood is safe. This is blood bank blood. Transfusion therapy is the safest it has ever been in the practice of medicine. Before blood is taken from a donor, a complete history is taken to make sure that person has no history of diseases nor engages in risky social behavior (examples are intravenous drug use or sexual activity with multiple  partners). The donor's travel history is screened to minimize risk of transmitting infections, such as malaria. The donated blood is tested for signs of infectious diseases, such as HIV and hepatitis. The blood is then tested to be sure it is compatible with you in order to minimize the chance of a transfusion reaction. If you or a relative donates blood, this is often done in anticipation of surgery and is not appropriate for emergency situations. It takes many days to process the donated blood. RISKS AND COMPLICATIONS Although transfusion therapy is very safe and saves many lives, the main dangers of transfusion include:   Getting an infectious disease.  Developing a transfusion reaction. This is an allergic reaction to something in the blood you were given. Every precaution is taken to prevent this. The decision to have a blood transfusion has been considered carefully by your caregiver before blood is given. Blood is not given unless the benefits outweigh the risks. AFTER THE TRANSFUSION  Right after receiving a blood transfusion, you will usually feel much better and more energetic. This is especially true if your red blood cells have gotten low (anemic). The transfusion raises the level of the red blood cells which carry oxygen, and this usually causes an energy increase.  The nurse administering the transfusion will  monitor you carefully for complications. HOME CARE INSTRUCTIONS  No special instructions are needed after a transfusion. You may find your energy is better. Speak with your caregiver about any limitations on activity for underlying diseases you may have. SEEK MEDICAL CARE IF:   Your condition is not improving after your transfusion.  You develop redness or irritation at the intravenous (IV) site. SEEK IMMEDIATE MEDICAL CARE IF:  Any of the following symptoms occur over the next 12 hours:  Shaking chills.  You have a temperature by mouth above 102 F (38.9 C), not  controlled by medicine.  Chest, back, or muscle pain.  People around you feel you are not acting correctly or are confused.  Shortness of breath or difficulty breathing.  Dizziness and fainting.  You get a rash or develop hives.  You have a decrease in urine output.  Your urine turns a dark color or changes to pink, red, or brown. Any of the following symptoms occur over the next 10 days:  You have a temperature by mouth above 102 F (38.9 C), not controlled by medicine.  Shortness of breath.  Weakness after normal activity.  The white part of the eye turns yellow (jaundice).  You have a decrease in the amount of urine or are urinating less often.  Your urine turns a dark color or changes to pink, red, or brown. Document Released: 09/17/2000 Document Revised: 12/13/2011 Document Reviewed: 05/06/2008 [facility] Patient Information 2014 [place], [place].  _______________________________________________________________________

## 2017-10-20 NOTE — Progress Notes (Signed)
LOV Dr Hyacinth MeekerMiller 06-23-17 epic

## 2017-10-24 ENCOUNTER — Encounter (INDEPENDENT_AMBULATORY_CARE_PROVIDER_SITE_OTHER): Payer: Self-pay

## 2017-10-24 ENCOUNTER — Other Ambulatory Visit: Payer: Self-pay

## 2017-10-24 ENCOUNTER — Encounter (HOSPITAL_COMMUNITY): Payer: Self-pay

## 2017-10-24 ENCOUNTER — Encounter (HOSPITAL_COMMUNITY)
Admission: RE | Admit: 2017-10-24 | Discharge: 2017-10-24 | Disposition: A | Discharge status: Home / Self Care | Payer: Medicare Other | Source: Ambulatory Visit | Attending: Orthopedic Surgery | Admitting: Orthopedic Surgery

## 2017-10-24 DIAGNOSIS — I10 Essential (primary) hypertension: Secondary | ICD-10-CM | POA: Insufficient documentation

## 2017-10-24 DIAGNOSIS — M1711 Unilateral primary osteoarthritis, right knee: Secondary | ICD-10-CM | POA: Diagnosis not present

## 2017-10-24 DIAGNOSIS — Z01818 Encounter for other preprocedural examination: Secondary | ICD-10-CM | POA: Insufficient documentation

## 2017-10-24 HISTORY — DX: Personal history of urinary calculi: Z87.442

## 2017-10-24 LAB — COMPREHENSIVE METABOLIC PANEL
ALBUMIN: 3.7 g/dL (ref 3.5–5.0)
ALK PHOS: 67 U/L (ref 38–126)
ALT: 23 U/L (ref 14–54)
AST: 25 U/L (ref 15–41)
Anion gap: 5 (ref 5–15)
BILIRUBIN TOTAL: 0.6 mg/dL (ref 0.3–1.2)
BUN: 19 mg/dL (ref 6–20)
CALCIUM: 9.2 mg/dL (ref 8.9–10.3)
CO2: 29 mmol/L (ref 22–32)
Chloride: 108 mmol/L (ref 101–111)
Creatinine, Ser: 0.85 mg/dL (ref 0.44–1.00)
GFR calc Af Amer: >60 mL/min (ref >60)
GFR calc non Af Amer: >60 mL/min (ref >60)
GLUCOSE: 132 mg/dL — AB (ref 65–99)
POTASSIUM: 4.1 mmol/L (ref 3.5–5.1)
Sodium: 142 mmol/L (ref 135–145)
TOTAL PROTEIN: 6.7 g/dL (ref 6.5–8.1)

## 2017-10-24 LAB — CBC
HCT: 39.4 % (ref 36.0–46.0)
HEMOGLOBIN: 12.5 g/dL (ref 12.0–15.0)
MCH: 29.5 pg (ref 26.0–34.0)
MCHC: 31.7 g/dL (ref 30.0–36.0)
MCV: 92.9 fL (ref 78.0–100.0)
Platelets: 247 10*3/uL (ref 150–400)
RBC: 4.24 MIL/uL (ref 3.87–5.11)
RDW: 13.5 % (ref 11.5–15.5)
WBC: 8.8 10*3/uL (ref 4.0–10.5)

## 2017-10-24 LAB — SURGICAL PCR SCREEN
MRSA, PCR: NEGATIVE
Staphylococcus aureus: NEGATIVE

## 2017-10-24 LAB — PROTIME-INR
INR: 1.04
Prothrombin Time: 13.5 seconds (ref 11.4–15.2)

## 2017-10-24 LAB — ABO/RH: ABO/RH(D): B NEG

## 2017-10-24 LAB — APTT: APTT: 26 s (ref 24–36)

## 2017-10-24 NOTE — Progress Notes (Signed)
E[facility] fax [facility]

## 2017-10-26 NOTE — Progress Notes (Signed)
EKG 09-06-17 on chart [facility]

## 2017-10-30 ENCOUNTER — Ambulatory Visit: Payer: Self-pay | Admitting: Orthopedic Surgery

## 2017-10-30 MED ORDER — BUPIVACAINE LIPOSOME 1.3 % IJ SUSP
20.0000 mL | INTRAMUSCULAR | Status: DC
  Filled 2017-10-30: qty 20

## 2017-10-30 NOTE — H&P (View-Only) (Signed)
Print Cheryl Goodman, Cheryl Goodman (77yo, F)  DOB 02-Aug-1941     Chief Complaint Right Knee Pain H&P Right Total Knee Replacement  Patient's Care Team Primary Care Provider: Bethann Punches MD: [address], [place], [place] [address], Ph [phone], Fax [phone] NPI: [id number] Patient's Pharmacies [id number]): [address], [place] [phone], Ph [phone], Fax [phone]    Vitals 10/21/2017 12:12 pm Ht: 4 ft 11 in  Wt: 185 lbs  BMI: 37.4 Pain Scale: 8 Pain Scale Type: Numeric   Allergies Reviewed Allergies PENICILLAMINE  SULFA (SULFONAMIDE ANTIBIOTICS)      Medications Reviewed Medications Aleve 10/21/17   entered Cheryl Crittendon, PA-C benzonatate 200 mg capsule 08/30/17   filled [facility] biotin 10/21/17   entered Cheryl Logan, PA-C citalopram 20 mg tablet 05/18/17   filled [facility] montelukast 10 mg tablet 10/17/17   filled [facility] pantoprazole 40 mg tablet,delayed release 08/30/17   filled [facility] sucralfate 1 gram tablet 08/30/17   filled [facility] Vitamin B12 10/21/17   entered Cheryl Quackenbush, PA-C Vitamin D 10/21/17   entered Cheryl Totino, PA-C    Problems Reviewed Problems Osteoarthritis of right knee joint - Onset: 10/18/2017   Family History Reviewed Family History    Social History Smoking Status: Never smoker Chewing tobacco: none Alcohol intake: None Hand Dominance: Right Work related injury?: N Advance directive: Y Medical Power of Attorney: Y    Surgical History Reviewed Surgical History Carpal Tunnel Release - 10/04/2008 Cholecystectomy - 10/05/2007 Hysterectomy - 10/04/1978 Appendectomy - 10/04/1966    Past Medical History Reviewed Past Medical History Anemia: Y - Iron Deficiency GERD/Reflux: Y Joint Pain: Y Osteoarthritis: Y Previous Cortisone Injection(s): Y Notes: History of IBS, History  of Kidney Stones, Osteoporosis     HPI Patient is a 77 year old female scheduled for a right total knee per Dr. Lequita Halt on 10/31/2017 at [facility]. The patient is a 76 year old female who has been seen for their knee. The patient is being followed for their bilateral knee pain and osteoarthritis. Symptoms reported include: pain. The patient feels that they are doing poorly.  Cheryl Goodman has severe pain in the RIGHT worse than LEFT knee. It has been going on for a long time but has been much worse in the past several months. She has had injections in the past without benefit. The knee hurts her at all times. It limits what she can and cannot do. She is even having pain at night now. Radiographs-AP and lateral of both knees are reviewed and she has bone-on-bone arthritis medial and patellofemoral compartments of both knees. The changes are slightly worse on the LEFT. It is felt that she would benefit from undergoing a total knee arthroplasty on the right. Risk and benefits have been discussed with the patient and she elects to proceed with surgery.   ROS Constitutional: Constitutional: no significant weight gain or loss and no fever.  HEENT: Eyes: no irritation, dry eyes, vision change, or sore throat.  Cardiovascular: Cardiovascular: no palpitations or chest pain.  Respiratory: Respiratory: no shortness of breath and No COPD; chronic cough.  Gastrointestinal: Gastrointestinal: no vomiting or diarrhea and not vomiting blood.  Musculoskeletal: Musculoskeletal: no swelling in Joints and Joint Pain.  Neurologic: Neurologic: no numbness, seizures, dizziness, or difficulty with balance.    Physical Exam Patient is a 77 year old female.  General Mental Status - Alert, cooperative and good historian.  General Appearance - pleasant, Not in acute distress. Orientation - Oriented X3. Build & Nutrition - Well nourished and Well developed.    Head and Neck Head - normocephalic,  atraumatic . Neck Global Assessment - supple, no bruit auscultated on the right, no bruit auscultated on the left.    Eye Pupil - Bilateral - PERR. Motion - Bilateral - EOMI.    Chest and Lung Exam Auscultation Breath sounds - clear at anterior chest wall and clear at posterior chest wall. Adventitious sounds - No Adventitious sounds.    Cardiovascular Auscultation Rhythm - Regular rate and rhythm. Heart Sounds - S1 WNL and S2 WNL. Murmurs & Other Heart Sounds - Auscultation of the heart reveals - No Murmurs.    Abdomen Palpation/Percussion Tenderness - Abdomen is non-tender to palpation. Abdomen is soft. Slightly round. Auscultation Auscultation of the abdomen reveals - Bowel sounds normal.    Female Genitourinary Note: Not done, not pertinent to present illness   Musculoskeletal Her LEFT knee shows no effusion. There is marked crepitus on range of motion. She is tender medial greater than lateral with no instability. Range is 5-125. RIGHT knee shows no effusion. There is marked crepitus on range of motion with slight varus deformity. She is tender medial greater than lateral with no instability noted. Pulses, sensation and motor are intact both lower extremities. She has a significantly antalgic gait pattern.  Radiographs-AP and lateral of both knees are reviewed and she has bone-on-bone arthritis medial and patellofemoral compartments of both knees. The changes are slightly worse on the LEFT.   Assessment / Plan 1. Osteoarthritis of right knee joint M17.11: Unilateral primary osteoarthritis, right knee  Patient Instructions Surgical Plans: Right Total Knee Replacement  Disposition: Home  PCP: Dr. Bethann PunchesMark Miller  IV TXA  Anesthesia Issues: None  Patient was instructed on what medications to stop prior to surgery.  Return to Office Trudie ReedMICHELE ERICKSON, DPT for [facility] on 11/04/2017 at 11:00 AM Ollen GrossFrank Aluisio, MD for  Post-Op at [facility] on 11/15/2017 at 03:15 PM  Encounter signed-off by Patrica DuelALEXZANDREW Aylana Hirschfeld, PA-C

## 2017-10-30 NOTE — H&P (Signed)
Print Cheryl Goodman, Cheryl Goodman (77yo, F)  DOB 02-Aug-1941     Chief Complaint Right Knee Pain H&P Right Total Knee Replacement  Patient's Care Team Primary Care Provider: Bethann Punches MD: [address], [place], [place] [address], Ph [phone], Fax [phone] NPI: [id number] Patient's Pharmacies [id number]): [address], [place] [phone], Ph [phone], Fax [phone]    Vitals 10/21/2017 12:12 pm Ht: 4 ft 11 in  Wt: 185 lbs  BMI: 37.4 Pain Scale: 8 Pain Scale Type: Numeric   Allergies Reviewed Allergies PENICILLAMINE  SULFA (SULFONAMIDE ANTIBIOTICS)      Medications Reviewed Medications Aleve 10/21/17   entered ALEXZANDREW PERKINS, PA-C benzonatate 200 mg capsule 08/30/17   filled [facility] biotin 10/21/17   entered ALEXZANDREW PERKINS, PA-C citalopram 20 mg tablet 05/18/17   filled [facility] montelukast 10 mg tablet 10/17/17   filled [facility] pantoprazole 40 mg tablet,delayed release 08/30/17   filled [facility] sucralfate 1 gram tablet 08/30/17   filled [facility] Vitamin B12 10/21/17   entered ALEXZANDREW PERKINS, PA-C Vitamin D 10/21/17   entered ALEXZANDREW PERKINS, PA-C    Problems Reviewed Problems Osteoarthritis of right knee joint - Onset: 10/18/2017   Family History Reviewed Family History    Social History Smoking Status: Never smoker Chewing tobacco: none Alcohol intake: None Hand Dominance: Right Work related injury?: N Advance directive: Y Medical Power of Attorney: Y    Surgical History Reviewed Surgical History Carpal Tunnel Release - 10/04/2008 Cholecystectomy - 10/05/2007 Hysterectomy - 10/04/1978 Appendectomy - 10/04/1966    Past Medical History Reviewed Past Medical History Anemia: Y - Iron Deficiency GERD/Reflux: Y Joint Pain: Y Osteoarthritis: Y Previous Cortisone Injection(s): Y Notes: History of IBS, History  of Kidney Stones, Osteoporosis     HPI Patient is a 77 year old female scheduled for a right total knee per Dr. Lequita Halt on 10/31/2017 at [facility]. The patient is a 76 year old female who has been seen for their knee. The patient is being followed for their bilateral knee pain and osteoarthritis. Symptoms reported include: pain. The patient feels that they are doing poorly.  [name] has severe pain in the RIGHT worse than LEFT knee. It has been going on for a long time but has been much worse in the past several months. She has had injections in the past without benefit. The knee hurts her at all times. It limits what she can and cannot do. She is even having pain at night now. Radiographs-AP and lateral of both knees are reviewed and she has bone-on-bone arthritis medial and patellofemoral compartments of both knees. The changes are slightly worse on the LEFT. It is felt that she would benefit from undergoing a total knee arthroplasty on the right. Risk and benefits have been discussed with the patient and she elects to proceed with surgery.   ROS Constitutional: Constitutional: no significant weight gain or loss and no fever.  HEENT: Eyes: no irritation, dry eyes, vision change, or sore throat.  Cardiovascular: Cardiovascular: no palpitations or chest pain.  Respiratory: Respiratory: no shortness of breath and No COPD; chronic cough.  Gastrointestinal: Gastrointestinal: no vomiting or diarrhea and not vomiting blood.  Musculoskeletal: Musculoskeletal: no swelling in Joints and Joint Pain.  Neurologic: Neurologic: no numbness, seizures, dizziness, or difficulty with balance.    Physical Exam Patient is a 77 year old female.  General Mental Status - Alert, cooperative and good historian.  General Appearance - pleasant, Not in acute distress. Orientation - Oriented X3. Build & Nutrition - Well nourished and Well developed.    Head and Neck Head - normocephalic,  atraumatic . Neck Global Assessment - supple, no bruit auscultated on the right, no bruit auscultated on the left.    Eye Pupil - Bilateral - PERR. Motion - Bilateral - EOMI.    Chest and Lung Exam Auscultation Breath sounds - clear at anterior chest wall and clear at posterior chest wall. Adventitious sounds - No Adventitious sounds.    Cardiovascular Auscultation Rhythm - Regular rate and rhythm. Heart Sounds - S1 WNL and S2 WNL. Murmurs & Other Heart Sounds - Auscultation of the heart reveals - No Murmurs.    Abdomen Palpation/Percussion Tenderness - Abdomen is non-tender to palpation. Abdomen is soft. Slightly round. Auscultation Auscultation of the abdomen reveals - Bowel sounds normal.    Female Genitourinary Note: Not done, not pertinent to present illness   Musculoskeletal Her LEFT knee shows no effusion. There is marked crepitus on range of motion. She is tender medial greater than lateral with no instability. Range is 5-125. RIGHT knee shows no effusion. There is marked crepitus on range of motion with slight varus deformity. She is tender medial greater than lateral with no instability noted. Pulses, sensation and motor are intact both lower extremities. She has a significantly antalgic gait pattern.  Radiographs-AP and lateral of both knees are reviewed and she has bone-on-bone arthritis medial and patellofemoral compartments of both knees. The changes are slightly worse on the LEFT.   Assessment / Plan 1. Osteoarthritis of right knee joint M17.11: Unilateral primary osteoarthritis, right knee  Patient Instructions Surgical Plans: Right Total Knee Replacement  Disposition: Home  PCP: Dr. Bethann PunchesMark Miller  IV TXA  Anesthesia Issues: None  Patient was instructed on what medications to stop prior to surgery.  Return to Office Trudie ReedMICHELE ERICKSON, DPT for Physical [facility] on 11/04/2017 at 11:00 AM Ollen GrossFrank Aluisio, MD for  Post-Op at [facility] on 11/15/2017 at 03:15 PM  Encounter signed-off by Patrica DuelALEXZANDREW PERKINS, PA-C

## 2017-10-31 ENCOUNTER — Encounter (HOSPITAL_COMMUNITY)
Admission: RE | Disposition: A | Discharge status: Home / Self Care | Payer: Self-pay | Source: Ambulatory Visit | Attending: Orthopedic Surgery

## 2017-10-31 ENCOUNTER — Inpatient Hospital Stay (HOSPITAL_COMMUNITY): Payer: Medicare Other | Admitting: Certified Registered"

## 2017-10-31 ENCOUNTER — Inpatient Hospital Stay (HOSPITAL_COMMUNITY)
Admission: RE | Admit: 2017-10-31 | Discharge: 2017-11-02 | DRG: 470 | Disposition: A | Discharge status: Home / Self Care | Payer: Medicare Other | Source: Ambulatory Visit | Attending: Orthopedic Surgery | Admitting: Orthopedic Surgery

## 2017-10-31 ENCOUNTER — Encounter (HOSPITAL_COMMUNITY): Payer: Self-pay | Admitting: *Deleted

## 2017-10-31 ENCOUNTER — Other Ambulatory Visit: Payer: Self-pay

## 2017-10-31 DIAGNOSIS — M1710 Unilateral primary osteoarthritis, unspecified knee: Secondary | ICD-10-CM | POA: Diagnosis present

## 2017-10-31 DIAGNOSIS — J449 Chronic obstructive pulmonary disease, unspecified: Secondary | ICD-10-CM | POA: Diagnosis present

## 2017-10-31 DIAGNOSIS — M1711 Unilateral primary osteoarthritis, right knee: Principal | ICD-10-CM | POA: Diagnosis present

## 2017-10-31 DIAGNOSIS — Z79899 Other long term (current) drug therapy: Secondary | ICD-10-CM | POA: Diagnosis not present

## 2017-10-31 DIAGNOSIS — I10 Essential (primary) hypertension: Secondary | ICD-10-CM | POA: Diagnosis present

## 2017-10-31 DIAGNOSIS — Z6836 Body mass index (BMI) 36.0-36.9, adult: Secondary | ICD-10-CM

## 2017-10-31 DIAGNOSIS — M179 Osteoarthritis of knee, unspecified: Secondary | ICD-10-CM

## 2017-10-31 DIAGNOSIS — D509 Iron deficiency anemia, unspecified: Secondary | ICD-10-CM | POA: Diagnosis present

## 2017-10-31 DIAGNOSIS — Z90710 Acquired absence of both cervix and uterus: Secondary | ICD-10-CM

## 2017-10-31 DIAGNOSIS — K219 Gastro-esophageal reflux disease without esophagitis: Secondary | ICD-10-CM | POA: Diagnosis present

## 2017-10-31 HISTORY — DX: Osteoarthritis of knee, unspecified: M17.9

## 2017-10-31 HISTORY — PX: TOTAL KNEE ARTHROPLASTY: SHX125

## 2017-10-31 LAB — TYPE AND SCREEN
ABO/RH(D): B NEG
Antibody Screen: NEGATIVE

## 2017-10-31 SURGERY — ARTHROPLASTY, KNEE, TOTAL
Anesthesia: Spinal | Site: Knee | Laterality: Right

## 2017-10-31 MED ORDER — MONTELUKAST SODIUM 10 MG PO TABS
10.0000 mg | ORAL_TABLET | Freq: Every day | ORAL | Status: DC
  Administered 2017-11-01: 10 mg via ORAL
  Filled 2017-10-31: qty 1

## 2017-10-31 MED ORDER — SODIUM CHLORIDE 0.9 % IV SOLN
INTRAVENOUS | Status: DC
  Administered 2017-10-31: 18:00:00 via INTRAVENOUS

## 2017-10-31 MED ORDER — METHOCARBAMOL 1000 MG/10ML IJ SOLN
500.0000 mg | Freq: Four times a day (QID) | INTRAVENOUS | Status: DC | PRN
  Filled 2017-10-31: qty 5

## 2017-10-31 MED ORDER — 0.9 % SODIUM CHLORIDE (POUR BTL) OPTIME
TOPICAL | Status: DC | PRN
  Administered 2017-10-31: 1000 mL

## 2017-10-31 MED ORDER — MEPERIDINE HCL 50 MG/ML IJ SOLN: 6.2500 mg | INTRAMUSCULAR | Status: DC | PRN

## 2017-10-31 MED ORDER — DOCUSATE SODIUM 100 MG PO CAPS
100.0000 mg | ORAL_CAPSULE | Freq: Two times a day (BID) | ORAL | Status: DC
  Administered 2017-11-01 – 2017-11-02 (×3): 100 mg via ORAL
  Filled 2017-10-31 (×3): qty 1

## 2017-10-31 MED ORDER — PROPOFOL 10 MG/ML IV BOLUS
INTRAVENOUS | Status: AC
  Filled 2017-10-31: qty 40

## 2017-10-31 MED ORDER — PANTOPRAZOLE SODIUM 40 MG PO TBEC
40.0000 mg | DELAYED_RELEASE_TABLET | Freq: Every day | ORAL | Status: DC
  Administered 2017-11-01: 40 mg via ORAL
  Filled 2017-10-31: qty 1

## 2017-10-31 MED ORDER — BISACODYL 10 MG RE SUPP: 10.0000 mg | Freq: Every day | RECTAL | Status: DC | PRN

## 2017-10-31 MED ORDER — BUPIVACAINE HCL 0.25 % IJ SOLN
INTRAMUSCULAR | Status: DC | PRN
  Administered 2017-10-31: 30 mL

## 2017-10-31 MED ORDER — OXYCODONE HCL 5 MG PO TABS
10.0000 mg | ORAL_TABLET | ORAL | Status: DC | PRN
  Administered 2017-10-31 – 2017-11-02 (×7): 10 mg via ORAL
  Filled 2017-10-31 (×7): qty 2

## 2017-10-31 MED ORDER — RIVAROXABAN 10 MG PO TABS
10.0000 mg | ORAL_TABLET | Freq: Every day | ORAL | Status: DC
  Administered 2017-11-01 – 2017-11-02 (×2): 10 mg via ORAL
  Filled 2017-10-31 (×2): qty 1

## 2017-10-31 MED ORDER — ACETAMINOPHEN 325 MG PO TABS: 650.0000 mg | ORAL_TABLET | ORAL | Status: DC | PRN

## 2017-10-31 MED ORDER — MIDAZOLAM HCL 2 MG/2ML IJ SOLN: 0.5000 mg | Freq: Once | INTRAMUSCULAR | Status: DC | PRN

## 2017-10-31 MED ORDER — MIDAZOLAM HCL 2 MG/2ML IJ SOLN
1.0000 mg | INTRAMUSCULAR | Status: DC
  Administered 2017-10-31: 1 mg via INTRAVENOUS
  Filled 2017-10-31: qty 2

## 2017-10-31 MED ORDER — LACTATED RINGERS IV SOLN
INTRAVENOUS | Status: DC
  Administered 2017-10-31 (×2): via INTRAVENOUS

## 2017-10-31 MED ORDER — ONDANSETRON HCL 4 MG/2ML IJ SOLN
4.0000 mg | Freq: Four times a day (QID) | INTRAMUSCULAR | Status: DC | PRN
  Administered 2017-10-31: 4 mg via INTRAVENOUS
  Filled 2017-10-31 (×2): qty 2

## 2017-10-31 MED ORDER — DEXAMETHASONE SODIUM PHOSPHATE 10 MG/ML IJ SOLN
10.0000 mg | Freq: Once | INTRAMUSCULAR | Status: AC
  Administered 2017-11-01: 10 mg via INTRAVENOUS
  Filled 2017-10-31: qty 1

## 2017-10-31 MED ORDER — ORAL CARE MOUTH RINSE
15.0000 mL | Freq: Two times a day (BID) | OROMUCOSAL | Status: DC
  Administered 2017-11-01: 15 mL via OROMUCOSAL

## 2017-10-31 MED ORDER — TRAMADOL HCL 50 MG PO TABS: 50.0000 mg | ORAL_TABLET | Freq: Four times a day (QID) | ORAL | Status: DC | PRN

## 2017-10-31 MED ORDER — LORATADINE 10 MG PO TABS
10.0000 mg | ORAL_TABLET | Freq: Every evening | ORAL | Status: DC
  Administered 2017-11-01: 10 mg via ORAL
  Filled 2017-10-31: qty 1

## 2017-10-31 MED ORDER — BENZONATATE 100 MG PO CAPS: 200.0000 mg | ORAL_CAPSULE | Freq: Three times a day (TID) | ORAL | Status: DC | PRN

## 2017-10-31 MED ORDER — PHENYLEPHRINE 40 MCG/ML (10ML) SYRINGE FOR IV PUSH (FOR BLOOD PRESSURE SUPPORT)
PREFILLED_SYRINGE | INTRAVENOUS | Status: AC
  Filled 2017-10-31: qty 10

## 2017-10-31 MED ORDER — SODIUM CHLORIDE 0.9 % IJ SOLN
INTRAMUSCULAR | Status: AC
  Filled 2017-10-31: qty 10

## 2017-10-31 MED ORDER — POLYETHYLENE GLYCOL 3350 17 G PO PACK: 17.0000 g | PACK | Freq: Every day | ORAL | Status: DC | PRN

## 2017-10-31 MED ORDER — PROMETHAZINE HCL 25 MG/ML IJ SOLN: 6.2500 mg | INTRAMUSCULAR | Status: DC | PRN

## 2017-10-31 MED ORDER — PROPOFOL 500 MG/50ML IV EMUL
INTRAVENOUS | Status: DC | PRN
  Administered 2017-10-31: 50 ug/kg/min via INTRAVENOUS

## 2017-10-31 MED ORDER — BUPIVACAINE LIPOSOME 1.3 % IJ SUSP
INTRAMUSCULAR | Status: DC | PRN
  Administered 2017-10-31: 20 mL

## 2017-10-31 MED ORDER — FENTANYL CITRATE (PF) 100 MCG/2ML IJ SOLN
50.0000 ug | INTRAMUSCULAR | Status: DC
  Administered 2017-10-31: 50 ug via INTRAVENOUS
  Filled 2017-10-31: qty 2

## 2017-10-31 MED ORDER — ROPIVACAINE HCL 7.5 MG/ML IJ SOLN
INTRAMUSCULAR | Status: DC | PRN
  Administered 2017-10-31: 20 mL via PERINEURAL

## 2017-10-31 MED ORDER — CHLORHEXIDINE GLUCONATE 4 % EX LIQD: 60.0000 mL | Freq: Once | CUTANEOUS | Status: DC

## 2017-10-31 MED ORDER — PHENYLEPHRINE HCL 10 MG/ML IJ SOLN
INTRAMUSCULAR | Status: DC | PRN
  Administered 2017-10-31 (×8): 40 ug via INTRAVENOUS
  Administered 2017-10-31: 100 ug via INTRAVENOUS
  Administered 2017-10-31 (×2): 40 ug via INTRAVENOUS

## 2017-10-31 MED ORDER — HYDROMORPHONE HCL 1 MG/ML IJ SOLN
INTRAMUSCULAR | Status: AC
  Filled 2017-10-31: qty 1

## 2017-10-31 MED ORDER — SODIUM CHLORIDE 0.9 % IJ SOLN
INTRAMUSCULAR | Status: DC | PRN
  Administered 2017-10-31: 60 mL via INTRAVENOUS

## 2017-10-31 MED ORDER — HYDROMORPHONE HCL 1 MG/ML IJ SOLN
0.2500 mg | INTRAMUSCULAR | Status: DC | PRN
  Administered 2017-10-31: 0.5 mg via INTRAVENOUS

## 2017-10-31 MED ORDER — CEFAZOLIN SODIUM-DEXTROSE 2-4 GM/100ML-% IV SOLN
2.0000 g | Freq: Four times a day (QID) | INTRAVENOUS | Status: AC
  Administered 2017-10-31 – 2017-11-01 (×2): 2 g via INTRAVENOUS
  Filled 2017-10-31 (×2): qty 100

## 2017-10-31 MED ORDER — ACETAMINOPHEN 500 MG PO TABS
1000.0000 mg | ORAL_TABLET | Freq: Four times a day (QID) | ORAL | Status: AC
  Administered 2017-10-31 – 2017-11-01 (×4): 1000 mg via ORAL
  Filled 2017-10-31 (×4): qty 2

## 2017-10-31 MED ORDER — DEXAMETHASONE SODIUM PHOSPHATE 10 MG/ML IJ SOLN
INTRAMUSCULAR | Status: AC
  Filled 2017-10-31: qty 1

## 2017-10-31 MED ORDER — SODIUM CHLORIDE 0.9 % IJ SOLN
INTRAMUSCULAR | Status: AC
  Filled 2017-10-31: qty 50

## 2017-10-31 MED ORDER — METOCLOPRAMIDE HCL 5 MG/ML IJ SOLN: 5.0000 mg | Freq: Three times a day (TID) | INTRAMUSCULAR | Status: DC | PRN

## 2017-10-31 MED ORDER — SUCRALFATE 1 G PO TABS
1.0000 g | ORAL_TABLET | Freq: Two times a day (BID) | ORAL | Status: DC
  Administered 2017-11-01 – 2017-11-02 (×3): 1 g via ORAL
  Filled 2017-10-31 (×3): qty 1

## 2017-10-31 MED ORDER — DIPHENHYDRAMINE HCL 12.5 MG/5ML PO ELIX: 12.5000 mg | ORAL_SOLUTION | ORAL | Status: DC | PRN

## 2017-10-31 MED ORDER — TRAMADOL HCL 50 MG PO TABS
50.0000 mg | ORAL_TABLET | Freq: Four times a day (QID) | ORAL | Status: DC | PRN
  Administered 2017-11-02 (×2): 100 mg via ORAL
  Filled 2017-10-31 (×2): qty 2

## 2017-10-31 MED ORDER — BUPIVACAINE IN DEXTROSE 0.75-8.25 % IT SOLN
INTRATHECAL | Status: DC | PRN
  Administered 2017-10-31: 1.6 mL via INTRATHECAL

## 2017-10-31 MED ORDER — METHOCARBAMOL 500 MG PO TABS
500.0000 mg | ORAL_TABLET | Freq: Four times a day (QID) | ORAL | Status: DC | PRN
  Administered 2017-11-01 – 2017-11-02 (×4): 500 mg via ORAL
  Filled 2017-10-31 (×4): qty 1

## 2017-10-31 MED ORDER — CEFAZOLIN SODIUM-DEXTROSE 2-4 GM/100ML-% IV SOLN
2.0000 g | INTRAVENOUS | Status: AC
  Administered 2017-10-31: 2 g via INTRAVENOUS
  Filled 2017-10-31: qty 100

## 2017-10-31 MED ORDER — ACETAMINOPHEN 10 MG/ML IV SOLN
1000.0000 mg | Freq: Once | INTRAVENOUS | Status: AC
  Administered 2017-10-31: 1000 mg via INTRAVENOUS
  Filled 2017-10-31: qty 100

## 2017-10-31 MED ORDER — LIDOCAINE 2% (20 MG/ML) 5 ML SYRINGE
INTRAMUSCULAR | Status: AC
  Filled 2017-10-31: qty 5

## 2017-10-31 MED ORDER — OXYCODONE HCL 5 MG PO TABS
5.0000 mg | ORAL_TABLET | ORAL | Status: DC | PRN
  Administered 2017-11-01 (×3): 5 mg via ORAL
  Filled 2017-10-31 (×3): qty 1

## 2017-10-31 MED ORDER — MENTHOL 3 MG MT LOZG: 1.0000 | LOZENGE | OROMUCOSAL | Status: DC | PRN

## 2017-10-31 MED ORDER — ONDANSETRON HCL 4 MG/2ML IJ SOLN
INTRAMUSCULAR | Status: DC | PRN
  Administered 2017-10-31: 4 mg via INTRAVENOUS

## 2017-10-31 MED ORDER — DEXAMETHASONE SODIUM PHOSPHATE 10 MG/ML IJ SOLN
INTRAMUSCULAR | Status: DC | PRN
  Administered 2017-10-31: 10 mg via INTRAVENOUS

## 2017-10-31 MED ORDER — FLEET ENEMA 7-19 GM/118ML RE ENEM: 1.0000 | ENEMA | Freq: Once | RECTAL | Status: DC | PRN

## 2017-10-31 MED ORDER — TRANEXAMIC ACID 1000 MG/10ML IV SOLN
1000.0000 mg | INTRAVENOUS | Status: AC
  Administered 2017-10-31: 1000 mg via INTRAVENOUS
  Filled 2017-10-31: qty 1100

## 2017-10-31 MED ORDER — ACETAMINOPHEN 650 MG RE SUPP: 650.0000 mg | RECTAL | Status: DC | PRN

## 2017-10-31 MED ORDER — SODIUM CHLORIDE 0.9 % IR SOLN
Status: DC | PRN
Start: 2017-10-31 — End: 2017-10-31
  Administered 2017-10-31: 1000 mL

## 2017-10-31 MED ORDER — PHENOL 1.4 % MT LIQD
1.0000 | OROMUCOSAL | Status: DC | PRN
  Filled 2017-10-31: qty 177

## 2017-10-31 MED ORDER — DEXAMETHASONE SODIUM PHOSPHATE 10 MG/ML IJ SOLN: 10.0000 mg | Freq: Once | INTRAMUSCULAR | Status: DC

## 2017-10-31 MED ORDER — BUPIVACAINE HCL (PF) 0.25 % IJ SOLN
INTRAMUSCULAR | Status: AC
  Filled 2017-10-31: qty 30

## 2017-10-31 MED ORDER — EPHEDRINE 5 MG/ML INJ
INTRAVENOUS | Status: AC
  Filled 2017-10-31: qty 10

## 2017-10-31 MED ORDER — TRANEXAMIC ACID 1000 MG/10ML IV SOLN
1000.0000 mg | Freq: Once | INTRAVENOUS | Status: AC
  Administered 2017-10-31: 1000 mg via INTRAVENOUS
  Filled 2017-10-31: qty 1100

## 2017-10-31 MED ORDER — ONDANSETRON HCL 4 MG/2ML IJ SOLN
INTRAMUSCULAR | Status: AC
Start: 2017-10-31 — End: ?
  Filled 2017-10-31: qty 2

## 2017-10-31 MED ORDER — METOCLOPRAMIDE HCL 5 MG PO TABS
5.0000 mg | ORAL_TABLET | Freq: Three times a day (TID) | ORAL | Status: DC | PRN
  Administered 2017-11-02: 10 mg via ORAL
  Filled 2017-10-31: qty 2

## 2017-10-31 MED ORDER — PROPOFOL 10 MG/ML IV BOLUS
INTRAVENOUS | Status: DC | PRN
  Administered 2017-10-31 (×2): 20 mg via INTRAVENOUS

## 2017-10-31 MED ORDER — LEVOCETIRIZINE DIHYDROCHLORIDE 5 MG PO TABS: 5.0000 mg | ORAL_TABLET | Freq: Every evening | ORAL | Status: DC

## 2017-10-31 MED ORDER — ONDANSETRON HCL 4 MG PO TABS
4.0000 mg | ORAL_TABLET | Freq: Four times a day (QID) | ORAL | Status: DC | PRN
  Administered 2017-11-02: 4 mg via ORAL
  Filled 2017-10-31: qty 1

## 2017-10-31 SURGICAL SUPPLY — 49 items
BAG DECANTER FOR FLEXI CONT (MISCELLANEOUS) ×2 IMPLANT
BAG SPEC THK2 15X12 ZIP CLS (MISCELLANEOUS) ×1
BAG ZIPLOCK 12X15 (MISCELLANEOUS) ×2 IMPLANT
BANDAGE ACE 6X5 VEL STRL LF (GAUZE/BANDAGES/DRESSINGS) ×2 IMPLANT
BLADE SAG 18X100X1.27 (BLADE) ×2 IMPLANT
BLADE SAW SGTL 11.0X1.19X90.0M (BLADE) ×2 IMPLANT
BOWL SMART MIX CTS (DISPOSABLE) ×2 IMPLANT
CAPT KNEE TOTAL 3 ATTUNE ×1 IMPLANT
CEMENT HV SMART SET (Cement) ×4 IMPLANT
COVER SURGICAL LIGHT HANDLE (MISCELLANEOUS) ×2 IMPLANT
CUFF TOURN SGL QUICK 34 (TOURNIQUET CUFF) ×2
CUFF TRNQT CYL 34X4X40X1 (TOURNIQUET CUFF) ×1 IMPLANT
DECANTER SPIKE VIAL GLASS SM (MISCELLANEOUS) ×2 IMPLANT
DRAPE U-SHAPE 47X51 STRL (DRAPES) ×2 IMPLANT
DRSG ADAPTIC 3X8 NADH LF (GAUZE/BANDAGES/DRESSINGS) ×2 IMPLANT
DRSG PAD ABDOMINAL 8X10 ST (GAUZE/BANDAGES/DRESSINGS) ×2 IMPLANT
DURAPREP 26ML APPLICATOR (WOUND CARE) ×2 IMPLANT
ELECT REM PT RETURN 15FT ADLT (MISCELLANEOUS) ×2 IMPLANT
EVACUATOR 1/8 PVC DRAIN (DRAIN) ×2 IMPLANT
GAUZE SPONGE 4X4 12PLY STRL (GAUZE/BANDAGES/DRESSINGS) ×2 IMPLANT
GLOVE BIO SURGEON STRL SZ7.5 (GLOVE) IMPLANT
GLOVE BIO SURGEON STRL SZ8 (GLOVE) ×2 IMPLANT
GLOVE BIOGEL PI IND STRL 6.5 (GLOVE) IMPLANT
GLOVE BIOGEL PI IND STRL 8 (GLOVE) ×1 IMPLANT
GLOVE BIOGEL PI INDICATOR 6.5 (GLOVE)
GLOVE BIOGEL PI INDICATOR 8 (GLOVE) ×1
GLOVE SURG SS PI 6.5 STRL IVOR (GLOVE) IMPLANT
GOWN STRL REUS W/TWL LRG LVL3 (GOWN DISPOSABLE) ×2 IMPLANT
GOWN STRL REUS W/TWL XL LVL3 (GOWN DISPOSABLE) IMPLANT
HANDPIECE INTERPULSE COAX TIP (DISPOSABLE) ×2
IMMOBILIZER KNEE 20 (SOFTGOODS) ×2
IMMOBILIZER KNEE 20 THIGH 36 (SOFTGOODS) ×1 IMPLANT
MANIFOLD NEPTUNE II (INSTRUMENTS) ×2 IMPLANT
NS IRRIG 1000ML POUR BTL (IV SOLUTION) ×2 IMPLANT
PACK TOTAL KNEE CUSTOM (KITS) ×2 IMPLANT
PADDING CAST COTTON 6X4 STRL (CAST SUPPLIES) ×5 IMPLANT
POSITIONER SURGICAL ARM (MISCELLANEOUS) ×2 IMPLANT
SET HNDPC FAN SPRY TIP SCT (DISPOSABLE) ×1 IMPLANT
STRIP CLOSURE SKIN 1/2X4 (GAUZE/BANDAGES/DRESSINGS) ×4 IMPLANT
SUT MNCRL AB 4-0 PS2 18 (SUTURE) ×2 IMPLANT
SUT STRATAFIX 0 PDS 27 VIOLET (SUTURE) ×2
SUT VIC AB 2-0 CT1 27 (SUTURE) ×6
SUT VIC AB 2-0 CT1 TAPERPNT 27 (SUTURE) ×3 IMPLANT
SUTURE STRATFX 0 PDS 27 VIOLET (SUTURE) ×1 IMPLANT
SYR 30ML LL (SYRINGE) ×4 IMPLANT
TRAY FOLEY W/METER SILVER 16FR (SET/KITS/TRAYS/PACK) ×2 IMPLANT
WATER STERILE IRR 1000ML POUR (IV SOLUTION) ×4 IMPLANT
WRAP KNEE MAXI GEL POST OP (GAUZE/BANDAGES/DRESSINGS) ×2 IMPLANT
YANKAUER SUCT BULB TIP 10FT TU (MISCELLANEOUS) ×2 IMPLANT

## 2017-10-31 NOTE — Op Note (Signed)
OPERATIVE REPORT-TOTAL KNEE ARTHROPLASTY   Pre-operative diagnosis- Osteoarthritis  Right knee(s)  Post-operative diagnosis- Osteoarthritis Right knee(s)  Procedure-  Right  Total Knee Arthroplasty  Surgeon- Gus Rankin. Shermika Balthaser, MD  Assistant- Avel Peace, PA-C   Anesthesia-  Adductor canal block and spinal  EBL-25 mL   Drains Hemovac  Tourniquet time-  Total Tourniquet Time Documented: Thigh (Right) - 33 minutes Total: Thigh (Right) - 33 minutes     Complications- None  Condition-PACU - hemodynamically stable.   Brief Clinical Note  Cheryl Goodman is a 77 y.o. year old female with end stage OA of her right knee with progressively worsening pain and dysfunction. She has constant pain, with activity and at rest and significant functional deficits with difficulties even with ADLs. She has had extensive non-op management including analgesics, injections of cortisone and viscosupplements, and home exercise program, but remains in significant pain with significant dysfunction.Radiographs show bone on bone arthritis medial and patellofemoral. She presents now for right Total Knee Arthroplasty.    Procedure in detail---   The patient is brought into the operating room and positioned supine on the operating table. After successful administration of  Adductor canal block and spinal,   a tourniquet is placed high on the  Right thigh(s) and the lower extremity is prepped and draped in the usual sterile fashion. Time out is performed by the operating team and then the  Right lower extremity is wrapped in Esmarch, knee flexed and the tourniquet inflated to 300 mmHg.       A midline incision is made with a ten blade through the subcutaneous tissue to the level of the extensor mechanism. A fresh blade is used to make a medial parapatellar arthrotomy. Soft tissue over the proximal medial tibia is subperiosteally elevated to the joint line with a knife and into the semimembranosus bursa with a  Cobb elevator. Soft tissue over the proximal lateral tibia is elevated with attention being paid to avoiding the patellar tendon on the tibial tubercle. The patella is everted, knee flexed 90 degrees and the ACL and PCL are removed. Findings are bone on bone medial and patellofemoral with large global osteophytes.        The drill is used to create a starting hole in the distal femur and the canal is thoroughly irrigated with sterile saline to remove the fatty contents. The 5 degree Right  valgus alignment guide is placed into the femoral canal and the distal femoral cutting block is pinned to remove 9 mm off the distal femur. Resection is made with an oscillating saw.      The tibia is subluxed forward and the menisci are removed. The extramedullary alignment guide is placed referencing proximally at the medial aspect of the tibial tubercle and distally along the second metatarsal axis and tibial crest. The block is pinned to remove 2mm off the more deficient medial  side. Resection is made with an oscillating saw. Size 3is the most appropriate size for the tibia and the proximal tibia is prepared with the modular drill and keel punch for that size.      The femoral sizing guide is placed and size 4 is most appropriate. Rotation is marked off the epicondylar axis and confirmed by creating a rectangular flexion gap at 90 degrees. The size 4 cutting block is pinned in this rotation and the anterior, posterior and chamfer cuts are made with the oscillating saw. The intercondylar block is then placed and that cut is made.  Trial size 3 tibial component, trial size 4 posterior stabilized femur and a 10  mm posterior stabilized rotating platform insert trial is placed. Full extension is achieved with excellent varus/valgus and anterior/posterior balance throughout full range of motion. The patella is everted and thickness measured to be 22  mm. Free hand resection is taken to 12 mm, a 35 template is placed, lug  holes are drilled, trial patella is placed, and it tracks normally. Osteophytes are removed off the posterior femur with the trial in place. All trials are removed and the cut bone surfaces prepared with pulsatile lavage. Cement is mixed and once ready for implantation, the size 3 tibial implant, size  4 posterior stabilized femoral component, and the size 35 patella are cemented in place and the patella is held with the clamp. The trial insert is placed and the knee held in full extension. The Exparel (20 ml mixed with 60 ml saline) is injected into the extensor mechanism, posterior capsule, medial and lateral gutters and subcutaneous tissues.  All extruded cement is removed and once the cement is hard the permanent 10 mm posterior stabilized rotating platform insert is placed into the tibial tray.      The wound is copiously irrigated with saline solution and the extensor mechanism closed over a hemovac drain with #1 V-loc suture. The tourniquet is released for a total tourniquet time of 33  minutes. Flexion against gravity is 140 degrees and the patella tracks normally. Subcutaneous tissue is closed with 2.0 vicryl and subcuticular with running 4.0 Monocryl. The incision is cleaned and dried and steri-strips and a bulky sterile dressing are applied. The limb is placed into a knee immobilizer and the patient is awakened and transported to recovery in stable condition.      Please note that a surgical assistant was a medical necessity for this procedure in order to perform it in a safe and expeditious manner. Surgical assistant was necessary to retract the ligaments and vital neurovascular structures to prevent injury to them and also necessary for proper positioning of the limb to allow for anatomic placement of the prosthesis.   Gus RankinFrank V. Zaiyden Strozier, MD    10/31/2017, 2:24 PM

## 2017-10-31 NOTE — Discharge Instructions (Addendum)
° °Dr. Frank Aluisio °Total Joint Specialist °[facility] °[address] °[place], [place] [address] °[phone] ° °TOTAL KNEE REPLACEMENT POSTOPERATIVE DIRECTIONS ° °Knee Rehabilitation, Guidelines Following Surgery  °Results after knee surgery are often greatly improved when you follow the exercise, range of motion and muscle strengthening exercises prescribed by your doctor. Safety measures are also important to protect the knee from further injury. Any time any of these exercises cause you to have increased pain or swelling in your knee joint, decrease the amount until you are comfortable again and slowly increase them. If you have problems or questions, call your caregiver or physical therapist for advice.  ° °HOME CARE INSTRUCTIONS  °Remove items at home which could result in a fall. This includes throw rugs or furniture in walking pathways.  °· ICE to the affected knee every three hours for 30 minutes at a time and then as needed for pain and swelling.  Continue to use ice on the knee for pain and swelling from surgery. You may notice swelling that will progress down to the foot and ankle.  This is normal after surgery.  Elevate the leg when you are not up walking on it.   °· Continue to use the breathing machine which will help keep your temperature down.  It is common for your temperature to cycle up and down following surgery, especially at night when you are not up moving around and exerting yourself.  The breathing machine keeps your lungs expanded and your temperature down. °· Do not place pillow under knee, focus on keeping the knee straight while resting ° °DIET °You may resume your previous home diet once your are discharged from the hospital. ° °DRESSING / WOUND CARE / SHOWERING °You may shower 3 days after surgery, but keep the wounds dry during showering.  You may use an occlusive plastic wrap (Press'n Seal for example), NO SOAKING/SUBMERGING IN THE BATHTUB.  If the bandage gets  wet, change with a clean dry gauze.  If the incision gets wet, pat the wound dry with a clean towel. °You may start showering once you are discharged home but do not submerge the incision under water. Just pat the incision dry and apply a dry gauze dressing on daily. °Change the surgical dressing daily and reapply a dry dressing each time. ° °ACTIVITY °Walk with your walker as instructed. °Use walker as long as suggested by your caregivers. °Avoid periods of inactivity such as sitting longer than an hour when not asleep. This helps prevent blood clots.  °You may resume a sexual relationship in one month or when given the OK by your doctor.  °You may return to work once you are cleared by your doctor.  °Do not drive a car for 6 weeks or until released by you surgeon.  °Do not drive while taking narcotics. ° °WEIGHT BEARING °Weight bearing as tolerated with assist device (walker, cane, etc) as directed, use it as long as suggested by your surgeon or therapist, typically at least 4-6 weeks. ° °POSTOPERATIVE CONSTIPATION PROTOCOL °Constipation - defined medically as fewer than three stools per week and severe constipation as less than one stool per week. ° °One of the most common issues patients have following surgery is constipation.  Even if you have a regular bowel pattern at home, your normal regimen is likely to be disrupted due to multiple reasons following surgery.  Combination of anesthesia, postoperative narcotics, change in appetite and fluid intake all can affect your bowels.    In order to avoid complications following surgery, here are some recommendations in order to help you during your recovery period. ° °Colace (docusate) - Pick up an over-the-counter form of Colace or another stool softener and take twice a day as long as you are requiring postoperative pain medications.  Take with a full glass of water daily.  If you experience loose stools or diarrhea, hold the colace until you stool forms back up.  If  your symptoms do not get better within 1 week or if they get worse, check with your doctor. ° °Dulcolax (bisacodyl) - Pick up over-the-counter and take as directed by the product packaging as needed to assist with the movement of your bowels.  Take with a full glass of water.  Use this product as needed if not relieved by Colace only.  ° °MiraLax (polyethylene glycol) - Pick up over-the-counter to have on hand.  MiraLax is a solution that will increase the amount of water in your bowels to assist with bowel movements.  Take as directed and can mix with a glass of water, juice, soda, coffee, or tea.  Take if you go more than two days without a movement. °Do not use MiraLax more than once per day. Call your doctor if you are still constipated or irregular after using this medication for 7 days in a row. ° °If you continue to have problems with postoperative constipation, please contact the office for further assistance and recommendations.  If you experience "the worst abdominal pain ever" or develop nausea or vomiting, please contact the office immediatly for further recommendations for treatment. ° °ITCHING ° If you experience itching with your medications, try taking only a single pain pill, or even half a pain pill at a time.  You can also use Benadryl over the counter for itching or also to help with sleep.  ° °TED HOSE STOCKINGS °Wear the elastic stockings on both legs for three weeks following surgery during the day but you may remove then at night for sleeping. ° °MEDICATIONS °See your medication summary on the “After Visit Summary” that the nursing staff will review with you prior to discharge.  You may have some home medications which will be placed on hold until you complete the course of blood thinner medication.  It is important for you to complete the blood thinner medication as prescribed by your surgeon.  Continue your approved medications as instructed at time of discharge. ° °PRECAUTIONS °If you  experience chest pain or shortness of breath - call 911 immediately for transfer to the hospital emergency department.  °If you develop a fever greater that 101 F, purulent drainage from wound, increased redness or drainage from wound, foul odor from the wound/dressing, or calf pain - CONTACT YOUR SURGEON.   °                                                °FOLLOW-UP APPOINTMENTS °Make sure you keep all of your appointments after your operation with your surgeon and caregivers. You should call the office at the above phone number and make an appointment for approximately two weeks after the date of your surgery or on the date instructed by your surgeon outlined in the "After Visit Summary". ° ° °RANGE OF MOTION AND STRENGTHENING EXERCISES  °Rehabilitation of the knee is important following a knee injury or   an operation. After just a few days of immobilization, the muscles of the thigh which control the knee become weakened and shrink (atrophy). Knee exercises are designed to build up the tone and strength of the thigh muscles and to improve knee motion. Often times heat used for twenty to thirty minutes before working out will loosen up your tissues and help with improving the range of motion but do not use heat for the first two weeks following surgery. These exercises can be done on a training (exercise) mat, on the floor, on a table or on a bed. Use what ever works the best and is most comfortable for you Knee exercises include:  °Leg Lifts - While your knee is still immobilized in a splint or cast, you can do straight leg raises. Lift the leg to 60 degrees, hold for 3 sec, and slowly lower the leg. Repeat 10-20 times 2-3 times daily. Perform this exercise against resistance later as your knee gets better.  °Quad and Hamstring Sets - Tighten up the muscle on the front of the thigh (Quad) and hold for 5-10 sec. Repeat this 10-20 times hourly. Hamstring sets are done by pushing the foot backward against an object  and holding for 5-10 sec. Repeat as with quad sets.  °· Leg Slides: Lying on your back, slowly slide your foot toward your buttocks, bending your knee up off the floor (only go as far as is comfortable). Then slowly slide your foot back down until your leg is flat on the floor again. °· Angel Wings: Lying on your back spread your legs to the side as far apart as you can without causing discomfort.  °A rehabilitation program following serious knee injuries can speed recovery and prevent re-injury in the future due to weakened muscles. Contact your doctor or a physical therapist for more information on knee rehabilitation.  ° °IF YOU ARE TRANSFERRED TO A SKILLED REHAB FACILITY °If the patient is transferred to a skilled rehab facility following release from the hospital, a list of the current medications will be sent to the facility for the patient to continue.  When discharged from the skilled rehab facility, please have the facility set up the patient's Home Health Physical Therapy prior to being released. Also, the skilled facility will be responsible for providing the patient with their medications at time of release from the facility to include their pain medication, the muscle relaxants, and their blood thinner medication. If the patient is still at the rehab facility at time of the two week follow up appointment, the skilled rehab facility will also need to assist the patient in arranging follow up appointment in our office and any transportation needs. ° °MAKE SURE YOU:  °Understand these instructions.  °Get help right away if you are not doing well or get worse.  ° ° °Pick up stool softner and laxative for home use following surgery while on pain medications. °Do not submerge incision under water. °Please use good hand washing techniques while changing dressing each day. °May shower starting three days after surgery. °Please use a clean towel to pat the incision dry following showers. °Continue to use ice for  pain and swelling after surgery. °Do not use any lotions or creams on the incision until instructed by your surgeon. ° °Take Xarelto for two and a half more weeks following discharge from the hospital, then discontinue Xarelto. °Once the patient has completed the blood thinner regimen, then take a Baby 81 mg Aspirin daily for three   more weeks. ° ° ° °Information on my medicine - XARELTO® (Rivaroxaban) ° °Why was Xarelto® prescribed for you? °Xarelto® was prescribed for you to reduce the risk of blood clots forming after orthopedic surgery. The medical term for these abnormal blood clots is venous thromboembolism (VTE). ° °What do you need to know about xarelto® ? °Take your Xarelto® ONCE DAILY at the same time every day. °You may take it either with or without food. ° °If you have difficulty swallowing the tablet whole, you may crush it and mix in applesauce just prior to taking your dose. ° °Take Xarelto® exactly as prescribed by your doctor and DO NOT stop taking Xarelto® without talking to the doctor who prescribed the medication.  Stopping without other VTE prevention medication to take the place of Xarelto® may increase your risk of developing a clot. ° °After discharge, you should have regular check-up appointments with your healthcare provider that is prescribing your Xarelto®.   ° °What do you do if you miss a dose? °If you miss a dose, take it as soon as you remember on the same day then continue your regularly scheduled once daily regimen the next day. Do not take two doses of Xarelto® on the same day.  ° °Important Safety Information °A possible side effect of Xarelto® is bleeding. You should call your healthcare provider right away if you experience any of the following: °? Bleeding from an injury or your nose that does not stop. °? Unusual colored urine (red or dark brown) or unusual colored stools (red or black). °? Unusual bruising for unknown reasons. °? A serious fall or if you hit your head (even  if there is no bleeding). ° °Some medicines may interact with Xarelto® and might increase your risk of bleeding while on Xarelto®. To help avoid this, consult your healthcare provider or pharmacist prior to using any new prescription or non-prescription medications, including herbals, vitamins, non-steroidal anti-inflammatory drugs (NSAIDs) and supplements. ° °This website has more information on Xarelto®: www.xarelto.com. ° ° ° °

## 2017-10-31 NOTE — Anesthesia Procedure Notes (Signed)
Performed by: Wilder GladeWinn, Demareon Coldwell G, CRNA Oxygen Delivery Method: Simple face mask

## 2017-10-31 NOTE — Anesthesia Postprocedure Evaluation (Addendum)
Anesthesia Post Note  Patient: Cheryl Goodman  Procedure(s) Performed: RIGHT TOTAL KNEE ARTHROPLASTY (Right Knee)     Patient location during evaluation: PACU Anesthesia Type: Spinal Level of consciousness: awake and alert, oriented and patient cooperative Pain management: pain level controlled Vital Signs Assessment: post-procedure vital signs reviewed and stable Respiratory status: spontaneous breathing, nonlabored ventilation and respiratory function stable Cardiovascular status: blood pressure returned to baseline and stable Postop Assessment: spinal receding, patient able to bend at knees and no apparent nausea or vomiting Anesthetic complications: no    Last Vitals:  Vitals:   10/31/17 1600 10/31/17 1615  BP: (!) 170/88 (!) 170/89  Pulse: 65 69  Resp: 20 16  Temp: 36.6 C 36.8 C  SpO2: 99% 95%    Last Pain:  Vitals:   10/31/17 1600  TempSrc:   PainSc: 2                  Alante Tolan,E. Zoe Nordin

## 2017-10-31 NOTE — Progress Notes (Signed)
AssistedDr. Carswell Jackson with right, ultrasound guided, adductor canal block. Side rails up, monitors on throughout procedure. See vital signs in flow sheet. Tolerated Procedure well.  

## 2017-10-31 NOTE — Evaluation (Signed)
Physical Therapy Evaluation Patient Details Name: Cheryl Goodman MRN: [id number] DOB: [birth year] Today's Date: 10/31/2017   History of Present Illness  Pt s/p RTKA on 10/31/2017  Clinical Impression  Pt is s/p TKA resulting in the deficits listed below (see PT Problem List). pt will benefit from PT to increase their independence and safety with mobility to allow discharge home with family.      Follow Up Recommendations DC plan and follow up therapy as arranged by surgeon(pt has OPPT set up for 2/1 (see chart) )    Equipment Recommendations  None recommended by PT(pt already went to get her RW (see comment in this PT note) )    Recommendations for Other Services       Precautions / Restrictions Precautions Precautions: Knee Precaution Comments: educated pt and daughter with no pillow under R knee when in bed or resting in recliner Required Braces or Orthoses: Knee Immobilizer - Right Knee Immobilizer - Right: On when out of bed or walking;Discontinue once straight leg raise with < 10 degree lag Restrictions Weight Bearing Restrictions: No      Mobility  Bed Mobility Overal bed mobility: Needs Assistance Bed Mobility: Supine to Sit;Sit to Supine     Supine to sit: Min assist     General bed mobility comments: assist with RLE and increased time to scoot to edge of bed   Transfers Overall transfer level: Needs assistance Equipment used: Rolling walker (2 wheeled) Transfers: Sit to/from Stand Sit to Stand: Min assist         General transfer comment: requires low bed and uses a lot of momentum   Ambulation/Gait Ambulation/Gait assistance: Min assist Ambulation Distance (Feet): 25 Feet Assistive device: Rolling walker (2 wheeled) Gait Pattern/deviations: Step-to pattern Gait velocity: slow    General Gait Details: cues for sequences and safety with RW   Stairs            Wheelchair Mobility    Modified Rankin (Stroke Patients Only)        Balance                                             Pertinent Vitals/Pain Pain Assessment: 0-10 Pain Score: 3  Pain Location: medial and posterior knee with ambulation  Pain Descriptors / Indicators: Burning;Aching Pain Intervention(s): Limited activity within patient's tolerance;Monitored during session;RN gave pain meds during session;Ice applied    Home Living Family/patient expects to be discharged to:: Private residence Living Arrangements: Spouse/significant other Available Help at Discharge: Family Type of Home: House Home Access: Stairs to enter Entrance Stairs-Rails: Right Entrance Stairs-Number of Steps: 4-5 Home Layout: Two level;Able to live on main level with bedroom/bathroom;Full bath on main level(doesn't have to go upstairs for anything) Home Equipment: Walker - 4 wheels Additional Comments: just recieved order for RW and she got a 4 wheeled RW, PLEASE evaluate if patient progressing enough to be safe with the 4 wheeled on, or family will return and get the 2 wheeled RW. ** ALSO bed is very high and requires pt to do a step into her bed.     Prior Function Level of Independence: Independent         Comments: limited with distance in community due to pain in knee. (difficulty shopping etc)      Hand Dominance        Extremity/Trunk Assessment  Lower Extremity Assessment Lower Extremity Assessment: Overall WFL for tasks assessed(was able to do SLR 7 times prior to getting out of bed today. )       Communication   Communication: No difficulties  Cognition Arousal/Alertness: Awake/alert Behavior During Therapy: WFL for tasks assessed/performed Overall Cognitive Status: Within Functional Limits for tasks assessed                                        General Comments      Exercises Total Joint Exercises Ankle Circles/Pumps: AROM;10 reps;Both;Supine Quad Sets: AROM;Right;Supine;5 reps Heel Slides:  AAROM;Right;Supine;5 reps Straight Leg Raises: AROM;Right;Supine;5 reps Goniometric ROM: 5-50   Assessment/Plan    PT Assessment Patient needs continued PT services  PT Problem List Decreased strength;Decreased range of motion;Decreased activity tolerance       PT Treatment Interventions DME instruction;Gait training;Stair training;Functional mobility training;Therapeutic activities;Therapeutic exercise;Patient/family education    PT Goals (Current goals can be found in the Care Plan section)  Acute Rehab PT Goals Patient Stated Goal: I want to be able to walk better and put on my own gown tomorrow  PT Goal Formulation: With patient Time For Goal Achievement: 11/07/17 Potential to Achieve Goals: Good    Frequency 7X/week   Barriers to discharge        Co-evaluation               AM-PAC PT "6 Clicks" Daily Activity  Outcome Measure Difficulty turning over in bed (including adjusting bedclothes, sheets and blankets)?: Unable Difficulty moving from lying on back to sitting on the side of the bed? : Unable Difficulty sitting down on and standing up from a chair with arms (e.g., wheelchair, bedside commode, etc,.)?: Unable Help needed moving to and from a bed to chair (including a wheelchair)?: A Little Help needed walking in hospital room?: A Little Help needed climbing 3-5 steps with a railing? : A Lot 6 Click Score: 11    End of Session Equipment Utilized During Treatment: Gait belt;Right knee immobilizer Activity Tolerance: Patient tolerated treatment well Patient left: in chair;with call bell/phone within reach;with chair alarm set;with family/visitor present Nurse Communication: Mobility status PT Visit Diagnosis: Other abnormalities of gait and mobility (R26.89)    Time: 4098-1191 PT Time Calculation (min) (ACUTE ONLY): 37 min   Charges:   PT Evaluation $PT Eval Low Complexity: 1 Low PT Treatments $Gait Training: 8-22 mins   PT G CodesMarella Goodman, PT Pager: [phone] 10/31/2017   Cheryl Goodman, Cheryl Goodman 10/31/2017, 8:02 PM

## 2017-10-31 NOTE — Interval H&P Note (Signed)
History and Physical Interval Note:  10/31/2017 10:30 AM  Cheryl Goodman  has presented today for surgery, with the diagnosis of Osteoarthritis Right knee  The various methods of treatment have been discussed with the patient and family. After consideration of risks, benefits and other options for treatment, the patient has consented to  Procedure(s): RIGHT TOTAL KNEE ARTHROPLASTY (Right) as a surgical intervention .  The patient's history has been reviewed, patient examined, no change in status, stable for surgery.  I have reviewed the patient's chart and labs.  Questions were answered to the patient's satisfaction.     Homero FellersFrank Jong Rickman

## 2017-10-31 NOTE — Anesthesia Preprocedure Evaluation (Addendum)
Anesthesia Evaluation  Patient identified by MRN, date of birth, ID band Patient awake    Reviewed: Allergy & Precautions, NPO status , Patient's Chart, lab work & pertinent test results  History of Anesthesia Complications Negative for: history of anesthetic complications  Airway Mallampati: II  TM Distance: >3 FB Neck ROM: Full    Dental  (+) Caps, Dental Advisory Given   Pulmonary COPD,  COPD inhaler,    breath sounds clear to auscultation       Cardiovascular hypertension, (-) angina Rhythm:Regular Rate:Normal     Neuro/Psych negative neurological ROS     GI/Hepatic Neg liver ROS, GERD  Medicated and Controlled,  Endo/Other  Morbid obesity  Renal/GU negative Renal ROS     Musculoskeletal  (+) Arthritis , Osteoarthritis,    Abdominal (+) + obese,   Peds  Hematology negative hematology ROS (+)   Anesthesia Other Findings   Reproductive/Obstetrics                            Anesthesia Physical Anesthesia Plan  ASA: II  Anesthesia Plan: Spinal   Post-op Pain Management:  Regional for Post-op pain   Induction:   PONV Risk Score and Plan: 2 and Ondansetron and Dexamethasone  Airway Management Planned: Natural Airway and Simple Face Mask  Additional Equipment:   Intra-op Plan:   Post-operative Plan:   Informed Consent: I have reviewed the patients History and Physical, chart, labs and discussed the procedure including the risks, benefits and alternatives for the proposed anesthesia with the patient or authorized representative who has indicated his/her understanding and acceptance.   Dental advisory given  Plan Discussed with: CRNA and Surgeon  Anesthesia Plan Comments: (Plan routine monitors, SAB with adductor canal block for post op analgesia)        Anesthesia Quick Evaluation

## 2017-10-31 NOTE — Anesthesia Procedure Notes (Addendum)
Spinal  Patient location during procedure: OR Start time: 10/31/2017 1:10 PM End time: 10/31/2017 1:20 PM Staffing Anesthesiologist: Jairo BenJackson, [place], MD Resident/CRNA: Wilder GladeWinn, Malaisha Silliman G, CRNA Performed: resident/CRNA  Preanesthetic Checklist Completed: patient identified, site marked, surgical consent, pre-op evaluation, timeout performed, IV checked, risks and benefits discussed and monitors and equipment checked Spinal Block Patient position: sitting Prep: ChloraPrep Patient monitoring: blood pressure, continuous pulse ox, cardiac monitor and heart rate Approach: midline Location: L4-5 Injection technique: single-shot Needle Needle type: Pencan  Needle gauge: 25 G Needle length: 10 cm Needle insertion depth: 8 cm Assessment Sensory level: T12 Additional Notes Clear CSF return before placement barbotage easily negative paresthesia, note sterile prep with Chloraprep waited until dry and sterile drape sterile glove observing sterile procedure throughout.  One attempt at L4L5 unsuccessful relocalized with lidocaine approx 1 cm distal and successful first attempt there.  Dr. Irven EasterlyJ present for placement

## 2017-10-31 NOTE — Anesthesia Procedure Notes (Signed)
Anesthesia Regional Block: Adductor canal block   Pre-Anesthetic Checklist: ,, timeout performed, Correct Patient, Correct Site, Correct Laterality, Correct Procedure, Correct Position, site marked, Risks and benefits discussed,  Surgical consent,  Pre-op evaluation,  At surgeon's request and post-op pain management  Laterality: Right and Lower  Prep: chloraprep       Needles:  Injection technique: Single-shot  Needle Type: Echogenic Needle     Needle Length: 9cm  Needle Gauge: 21     Additional Needles:   Procedures:,,,, ultrasound used (permanent image in chart),,,,  Narrative:  Start time: 10/31/2017 12:58 PM End time: 10/31/2017 1:04 PM Injection made incrementally with aspirations every 5 mL.  Performed by: Personally  Anesthesiologist: Jairo BenJackson, Lavel Rieman, MD  Additional Notes: Pt identified in Holding room.  Monitors applied. Working IV access confirmed. Sterile prep R thigh.  #21ga ECHOgenic needle into adductor canal with US guidance.  20cc 0.75% Ropivacaine injected incrementally after negative test dose.  Patient asymptomatic, VSS, no heme aspirated, tolerated well.  Sandford Craze Esmeralda Blanford, MD

## 2017-10-31 NOTE — Transfer of Care (Signed)
Immediate Anesthesia Transfer of Care Note  Patient: Cheryl Goodman  Procedure(s) Performed: RIGHT TOTAL KNEE ARTHROPLASTY (Right Knee)  Patient Location: PACU  Anesthesia Type:Spinal  Level of Consciousness: awake, alert  and oriented  Airway & Oxygen Therapy: Patient Spontanous Breathing and Patient connected to face mask oxygen  Post-op Assessment: Report given to RN and Post -op Vital signs reviewed and stable  Post vital signs: Reviewed and stable  Last Vitals:  Vitals:   10/31/17 1305 10/31/17 1306  BP:  (!) 179/87  Pulse: 78 79  Resp: 14   Temp:    SpO2: 97% 98%    Last Pain:  Vitals:   10/31/17 1023  TempSrc: Oral         Complications: No apparent anesthesia complications

## 2017-11-01 ENCOUNTER — Encounter (HOSPITAL_COMMUNITY): Payer: Self-pay | Admitting: Orthopedic Surgery

## 2017-11-01 LAB — CBC
HEMATOCRIT: 34.1 % — AB (ref 36.0–46.0)
HEMOGLOBIN: 11.2 g/dL — AB (ref 12.0–15.0)
MCH: 29.8 pg (ref 26.0–34.0)
MCHC: 32.8 g/dL (ref 30.0–36.0)
MCV: 90.7 fL (ref 78.0–100.0)
Platelets: 236 10*3/uL (ref 150–400)
RBC: 3.76 MIL/uL — AB (ref 3.87–5.11)
RDW: 13.2 % (ref 11.5–15.5)
WBC: 12.6 10*3/uL — ABNORMAL HIGH (ref 4.0–10.5)

## 2017-11-01 LAB — BASIC METABOLIC PANEL
Anion gap: 6 (ref 5–15)
BUN: 13 mg/dL (ref 6–20)
CHLORIDE: 106 mmol/L (ref 101–111)
CO2: 26 mmol/L (ref 22–32)
Calcium: 8.3 mg/dL — ABNORMAL LOW (ref 8.9–10.3)
Creatinine, Ser: 0.77 mg/dL (ref 0.44–1.00)
GFR calc non Af Amer: >60 mL/min (ref >60)
Glucose, Bld: 139 mg/dL — ABNORMAL HIGH (ref 65–99)
POTASSIUM: 4 mmol/L (ref 3.5–5.1)
Sodium: 138 mmol/L (ref 135–145)

## 2017-11-01 MED ORDER — METHOCARBAMOL 500 MG PO TABS: 500.0000 mg | ORAL_TABLET | Freq: Four times a day (QID) | ORAL | 0 refills | Status: DC | PRN

## 2017-11-01 MED ORDER — RIVAROXABAN 10 MG PO TABS: 10.0000 mg | ORAL_TABLET | Freq: Every day | ORAL | 0 refills | Status: DC

## 2017-11-01 MED ORDER — TRAMADOL HCL 50 MG PO TABS: 50.0000 mg | ORAL_TABLET | Freq: Four times a day (QID) | ORAL | 0 refills | Status: DC | PRN

## 2017-11-01 MED ORDER — OXYCODONE HCL 5 MG PO TABS: 5.0000 mg | ORAL_TABLET | ORAL | 0 refills | Status: DC | PRN

## 2017-11-01 NOTE — Progress Notes (Signed)
Physical Therapy Treatment Patient Details Name: Cheryl Goodman MRN: [id number] DOB: 05-13-1941 Today's Date: 11/01/2017    History of Present Illness Pt s/p RTKA on 10/31/2017    PT Comments    POD # 1 pm session Assisted with amb to bathroom then a greater distance in hallway.  Assisted back to bed for CPM. Pt plans to D/C to home tomorrow.   Follow Up Recommendations  DC plan and follow up therapy as arranged by surgeon     Equipment Recommendations  None recommended by PT    Recommendations for Other Services       Precautions / Restrictions Precautions Precautions: Knee Precaution Comments: instructed on KI use for increased support and stairs Required Braces or Orthoses: Knee Immobilizer - Right Knee Immobilizer - Right: On when out of bed or walking;Discontinue once straight leg raise with < 10 degree lag Restrictions Weight Bearing Restrictions: No Other Position/Activity Restrictions: WBAT    Mobility  Bed Mobility           Sit to supine: Min assist   General bed mobility comments: assisted back to bed  Transfers Overall transfer level: Needs assistance Equipment used: Rolling walker (2 wheeled) Transfers: Sit to/from Stand Sit to Stand: Min guard         General transfer comment: asisst to rise and stabilize  Ambulation/Gait Ambulation/Gait assistance: Min assist Ambulation Distance (Feet): 42 Feet Assistive device: Rolling walker (2 wheeled) Gait Pattern/deviations: Step-to pattern Gait velocity: slow    General Gait Details: cues for sequences and safety with RW    Stairs            Wheelchair Mobility    Modified Rankin (Stroke Patients Only)       Balance                                            Cognition Arousal/Alertness: Awake/alert Behavior During Therapy: WFL for tasks assessed/performed Overall Cognitive Status: Within Functional Limits for tasks assessed                                         Exercises      General Comments        Pertinent Vitals/Pain      Home Living                      Prior Function            PT Goals (current goals can now be found in the care plan section) Progress towards PT goals: Progressing toward goals    Frequency    7X/week      PT Plan Current plan remains appropriate    Co-evaluation              AM-PAC PT "6 Clicks" Daily Activity  Outcome Measure  Difficulty turning over in bed (including adjusting bedclothes, sheets and blankets)?: Unable Difficulty moving from lying on back to sitting on the side of the bed? : Unable Difficulty sitting down on and standing up from a chair with arms (e.g., wheelchair, bedside commode, etc,.)?: Unable Help needed moving to and from a bed to chair (including a wheelchair)?: A Little Help needed walking in hospital room?: A Little Help needed climbing 3-5 steps with  a railing? : A Lot 6 Click Score: 11    End of Session Equipment Utilized During Treatment: Gait belt;Right knee immobilizer Activity Tolerance: Patient tolerated treatment well Patient left: in chair;with call bell/phone within reach;with chair alarm set;with family/visitor present Nurse Communication: Mobility status PT Visit Diagnosis: Other abnormalities of gait and mobility (R26.89)     Time: 2725-3664 PT Time Calculation (min) (ACUTE ONLY): 16 min  Charges:  $Gait Training: 8-22 mins                    G Codes:       {Antonio Creswell  PTA [facility]  Acute  Rehab Pager      [phone]

## 2017-11-01 NOTE — Evaluation (Signed)
Occupational Therapy Evaluation Patient Details Name: Cheryl Goodman MRN: [id number] DOB: 13-Feb-1941 Today's Date: 11/01/2017    History of Present Illness Pt s/p RTKA on 10/31/2017   Clinical Impression   This 77 year old female was admitted for the above sx. She will benefit from continued OT in acute setting with min guard level goals for bathroom transfers. She will have assistance for adls as needed    Follow Up Recommendations  Supervision/Assistance - 24 hour    Equipment Recommendations  None recommended by OT    Recommendations for Other Services       Precautions / Restrictions Precautions Precautions: Knee Precaution Comments: Pt doing slrs; did not use ki for transfer to chair Required Braces or Orthoses: Knee Immobilizer - Right Restrictions Weight Bearing Restrictions: No      Mobility Bed Mobility         Supine to sit: Min guard     General bed mobility comments: assist for catheter line  Transfers   Equipment used: Rolling walker (2 wheeled)   Sit to Stand: Min assist         General transfer comment: asisst to rise and stabilize    Balance                                           ADL either performed or assessed with clinical judgement   ADL Overall ADL's : Needs assistance/impaired     Grooming: Set up;Sitting       Lower Body Bathing: Moderate assistance;Sit to/from stand       Lower Body Dressing: Maximal assistance;Sit to/from stand   Toilet Transfer: Minimal assistance;Stand-pivot;RW(to chair)             General ADL Comments: performed ADL from EOB; nauseous briefly when reaching to feet.  She has assist at home for ADLs; also explained use of reacher for pants. Reviewed KI (did not use this session) and precautions     Vision         Perception     Praxis      Pertinent Vitals/Pain Pain Score: 5  Pain Location: R thigh Pain Descriptors / Indicators: Aching Pain Intervention(s):  Limited activity within patient's tolerance;Monitored during session;Premedicated before session;Repositioned;Ice applied     Hand Dominance     Extremity/Trunk Assessment Upper Extremity Assessment Upper Extremity Assessment: Overall WFL for tasks assessed           Communication Communication Communication: No difficulties   Cognition Arousal/Alertness: Awake/alert Behavior During Therapy: WFL for tasks assessed/performed Overall Cognitive Status: Within Functional Limits for tasks assessed                                     General Comments       Exercises     Shoulder Instructions      Home Living Family/patient expects to be discharged to:: Private residence Living Arrangements: Spouse/significant other Available Help at Discharge: Family               Bathroom Shower/Tub: Walk-in Soil scientist Toilet: Handicapped height         Additional Comments: nothing next to toilet to push from.  one has a built in shower seat      Prior Functioning/Environment Level of Independence: Independent  Comments: limited with distance in community due to pain in knee. (difficulty shopping etc)         OT Problem List: Decreased strength;Decreased activity tolerance;Decreased knowledge of use of DME or AE;Pain;Decreased knowledge of precautions      OT Treatment/Interventions: Self-care/ADL training;DME and/or AE instruction;Patient/family education    OT Goals(Current goals can be found in the care plan section) Acute Rehab OT Goals Patient Stated Goal: I want to be able to walk better and put on my own gown tomorrow  OT Goal Formulation: With patient Time For Goal Achievement: 11/08/17 Potential to Achieve Goals: Good ADL Goals Pt Will Transfer to Toilet: with min guard assist;ambulating Pt Will Perform Toileting - Clothing Manipulation and hygiene: with min guard assist;sit to/from stand Pt Will Perform Tub/Shower Transfer:  Shower transfer;ambulating;shower seat;with min guard assist  OT Frequency: Min 2X/week   Barriers to D/C:            Co-evaluation              AM-PAC PT "6 Clicks" Daily Activity     Outcome Measure Help from another person eating meals?: None Help from another person taking care of personal grooming?: A Little Help from another person toileting, which includes using toliet, bedpan, or urinal?: A Little Help from another person bathing (including washing, rinsing, drying)?: A Lot Help from another person to put on and taking off regular upper body clothing?: A Little Help from another person to put on and taking off regular lower body clothing?: A Lot 6 Click Score: 17   End of Session    Activity Tolerance: Patient tolerated treatment well Patient left: in chair;with call bell/phone within reach;with family/visitor present  OT Visit Diagnosis: Pain Pain - Right/Left: Right Pain - part of body: Knee                Time: [id number] OT Time Calculation (min): 26 min Charges:  OT General Charges $OT Visit: 1 Visit OT Evaluation $OT Eval Low Complexity: 1 Low OT Treatments $Self Care/Home Management : 8-22 mins G-Codes:     MuncyMaryellen Mizraim Harmening, OTR/L [phone] 11/01/2017  Kalin Amrhein 11/01/2017, 9:44 AM

## 2017-11-01 NOTE — Progress Notes (Signed)
Physical Therapy Treatment Patient Details Name: Cheryl Goodman MRN: [id number] DOB: November 23, 1940 Today's Date: 11/01/2017    History of Present Illness Pt s/p RTKA on 10/31/2017    PT Comments    POD # 1 am session Applied KI and instructed on use.  Assisted with amb a greater distance.  Performed some TKR TE's followed by ICE. Pt plans to D/C tomorrow.    Follow Up Recommendations  DC plan and follow up therapy as arranged by surgeon     Equipment Recommendations  None recommended by PT    Recommendations for Other Services       Precautions / Restrictions Precautions Precautions: Knee Precaution Comments: instructed on KI use for increased support and stairs Required Braces or Orthoses: Knee Immobilizer - Right Knee Immobilizer - Right: On when out of bed or walking;Discontinue once straight leg raise with < 10 degree lag Restrictions Weight Bearing Restrictions: No Other Position/Activity Restrictions: WBAT    Mobility  Bed Mobility               General bed mobility comments: pt OOB in recliner  Transfers Overall transfer level: Needs assistance Equipment used: Rolling walker (2 wheeled) Transfers: Sit to/from Stand Sit to Stand: Min guard         General transfer comment: asisst to rise and stabilize  Ambulation/Gait Ambulation/Gait assistance: Min assist Ambulation Distance (Feet): 30 Feet Assistive device: Rolling walker (2 wheeled) Gait Pattern/deviations: Step-to pattern Gait velocity: slow    General Gait Details: cues for sequences and safety with RW    Stairs            Wheelchair Mobility    Modified Rankin (Stroke Patients Only)       Balance                                            Cognition Arousal/Alertness: Awake/alert Behavior During Therapy: WFL for tasks assessed/performed Overall Cognitive Status: Within Functional Limits for tasks assessed                                         Exercises   Total Knee Replacement TE's 10 reps B LE ankle pumps 10 reps towel squeezes 10 reps knee presses 10 reps heel slides  10 reps SAQ's 10 reps SLR's 10 reps ABD Followed by ICE    General Comments        Pertinent Vitals/Pain      Home Living                      Prior Function            PT Goals (current goals can now be found in the care plan section) Progress towards PT goals: Progressing toward goals    Frequency    7X/week      PT Plan Current plan remains appropriate    Co-evaluation              AM-PAC PT "6 Clicks" Daily Activity  Outcome Measure  Difficulty turning over in bed (including adjusting bedclothes, sheets and blankets)?: Unable Difficulty moving from lying on back to sitting on the side of the bed? : Unable Difficulty sitting down on and standing up from a chair with arms (e.g.,  wheelchair, bedside commode, etc,.)?: Unable Help needed moving to and from a bed to chair (including a wheelchair)?: A Little Help needed walking in hospital room?: A Little Help needed climbing 3-5 steps with a railing? : A Lot 6 Click Score: 11    End of Session Equipment Utilized During Treatment: Gait belt;Right knee immobilizer Activity Tolerance: Patient tolerated treatment well Patient left: in chair;with call bell/phone within reach;with chair alarm set;with family/visitor present Nurse Communication: Mobility status PT Visit Diagnosis: Other abnormalities of gait and mobility (R26.89)     Time: 1130-1200 PT Time Calculation (min) (ACUTE ONLY): 30 min  Charges:  $Gait Training: 8-22 mins $Therapeutic Exercise: 8-22 mins                    G Codes:       Felecia ShellingLori Eriel Doyon  PTA [facility]  Acute  Rehab Pager      [phone]

## 2017-11-01 NOTE — Discharge Summary (Signed)
Physician Discharge Summary   Patient ID: Cheryl Goodman MRN: [id number] DOB/AGE: 01/30/1941 77 y.o.  Admit date: 10/31/2017 Discharge date: 11/02/2017  Primary Diagnosis:  Osteoarthritis Right knee(s)   Admission Diagnoses:  Past Medical History:  Diagnosis Date  . Arthritis   . Dysphagia   . GERD (gastroesophageal reflux disease)   . History of hiatal hernia   . History of kidney stones    x2 ; passed independently  . Hyperlipidemia   . Hypertension   . Nausea    Discharge Diagnoses:   Principal Problem:   OA (osteoarthritis) of knee  Estimated body mass index is 36.33 kg/m as calculated from the following:   Height as of this encounter: 5' (1.524 m).   Weight as of this encounter: 84.4 kg (186 lb).  Procedure:  Procedure(s) (LRB): RIGHT TOTAL KNEE ARTHROPLASTY (Right)   Consults: None  HPI:  Cheryl Goodman is a 77 y.o. year old female with end stage OA of her right knee with progressively worsening pain and dysfunction. She has constant pain, with activity and at rest and significant functional deficits with difficulties even with ADLs. She has had extensive non-op management including analgesics, injections of cortisone and viscosupplements, and home exercise program, but remains in significant pain with significant dysfunction.Radiographs show bone on bone arthritis medial and patellofemoral. She presents now for right Total Knee Arthroplasty.     Laboratory Data: Admission on 10/31/2017  Component Date Value Ref Range Status  . WBC 11/01/2017 12.6* 4.0 - 10.5 K/uL Final  . RBC 11/01/2017 3.76* 3.87 - 5.11 MIL/uL Final  . Hemoglobin 11/01/2017 11.2* 12.0 - 15.0 g/dL Final  . HCT 11/01/2017 34.1* 36.0 - 46.0 % Final  . MCV 11/01/2017 90.7  78.0 - 100.0 fL Final  . MCH 11/01/2017 29.8  26.0 - 34.0 pg Final  . MCHC 11/01/2017 32.8  30.0 - 36.0 g/dL Final  . RDW 11/01/2017 13.2  11.5 - 15.5 % Final  . Platelets 11/01/2017 236  150 - 400 K/uL Final  .  Sodium 11/01/2017 138  135 - 145 mmol/L Final  . Potassium 11/01/2017 4.0  3.5 - 5.1 mmol/L Final  . Chloride 11/01/2017 106  101 - 111 mmol/L Final  . CO2 11/01/2017 26  22 - 32 mmol/L Final  . Glucose, Bld 11/01/2017 139* 65 - 99 mg/dL Final  . BUN 11/01/2017 13  6 - 20 mg/dL Final  . Creatinine, Ser 11/01/2017 0.77  0.44 - 1.00 mg/dL Final  . Calcium 11/01/2017 8.3* 8.9 - 10.3 mg/dL Final  . GFR calc non Af Amer 11/01/2017 >60  >60 mL/min Final  . GFR calc Af Amer 11/01/2017 >60  >60 mL/min Final   Comment: (NOTE) The eGFR has been calculated using the CKD EPI equation. This calculation has not been validated in all clinical situations. eGFR's persistently <60 mL/min signify possible Chronic Kidney Disease.   Georgiann Hahn gap 11/01/2017 6  5 - 15 Final  Hospital Outpatient Visit on 10/24/2017  Component Date Value Ref Range Status  . MRSA, PCR 10/24/2017 NEGATIVE  NEGATIVE Final  . Staphylococcus aureus 10/24/2017 NEGATIVE  NEGATIVE Final   Comment: (NOTE) The Xpert SA Assay (FDA approved for NASAL specimens in patients 70 years of age and older), is one component of a comprehensive surveillance program. It is not intended to diagnose infection nor to guide or monitor treatment.   Marland Kitchen aPTT 10/24/2017 26  24 - 36 seconds Final  . WBC 10/24/2017 8.8  4.0 - 10.5  K/uL Final  . RBC 10/24/2017 4.24  3.87 - 5.11 MIL/uL Final  . Hemoglobin 10/24/2017 12.5  12.0 - 15.0 g/dL Final  . HCT 10/24/2017 39.4  36.0 - 46.0 % Final  . MCV 10/24/2017 92.9  78.0 - 100.0 fL Final  . MCH 10/24/2017 29.5  26.0 - 34.0 pg Final  . MCHC 10/24/2017 31.7  30.0 - 36.0 g/dL Final  . RDW 10/24/2017 13.5  11.5 - 15.5 % Final  . Platelets 10/24/2017 247  150 - 400 K/uL Final  . Sodium 10/24/2017 142  135 - 145 mmol/L Final  . Potassium 10/24/2017 4.1  3.5 - 5.1 mmol/L Final  . Chloride 10/24/2017 108  101 - 111 mmol/L Final  . CO2 10/24/2017 29  22 - 32 mmol/L Final  . Glucose, Bld 10/24/2017 132* 65 - 99 mg/dL  Final  . BUN 10/24/2017 19  6 - 20 mg/dL Final  . Creatinine, Ser 10/24/2017 0.85  0.44 - 1.00 mg/dL Final  . Calcium 10/24/2017 9.2  8.9 - 10.3 mg/dL Final  . Total Protein 10/24/2017 6.7  6.5 - 8.1 g/dL Final  . Albumin 10/24/2017 3.7  3.5 - 5.0 g/dL Final  . AST 10/24/2017 25  15 - 41 U/L Final  . ALT 10/24/2017 23  14 - 54 U/L Final  . Alkaline Phosphatase 10/24/2017 67  38 - 126 U/L Final  . Total Bilirubin 10/24/2017 0.6  0.3 - 1.2 mg/dL Final  . GFR calc non Af Amer 10/24/2017 >60  >60 mL/min Final  . GFR calc Af Amer 10/24/2017 >60  >60 mL/min Final   Comment: (NOTE) The eGFR has been calculated using the CKD EPI equation. This calculation has not been validated in all clinical situations. eGFR's persistently <60 mL/min signify possible Chronic Kidney Disease.   . Anion gap 10/24/2017 5  5 - 15 Final  . Prothrombin Time 10/24/2017 13.5  11.4 - 15.2 seconds Final  . INR 10/24/2017 1.04   Final  . ABO/RH(D) 10/24/2017 B NEG   Final  . Antibody Screen 10/24/2017 NEG   Final  . Sample Expiration 10/24/2017 11/03/2017   Final  . Extend sample reason 10/24/2017 NO TRANSFUSIONS OR PREGNANCY IN THE PAST 3 MONTHS   Final  . ABO/RH(D) 10/24/2017 B NEG   Final     X-Rays:No results found.  EKG:No orders found for this or any previous visit.   Hospital Course: Cheryl Goodman is a 77 y.o. who was admitted to [facility]. They were brought to the operating room on 10/31/2017 and underwent Procedure(s): RIGHT TOTAL KNEE ARTHROPLASTY.  Patient tolerated the procedure well and was later transferred to the recovery room and then to the orthopaedic floor for postoperative care.  They were given PO and IV analgesics for pain control following their surgery.  They were given 24 hours of postoperative antibiotics of  Anti-infectives (From admission, onward)   Start     Dose/Rate Route Frequency Ordered Stop   10/31/17 1930  ceFAZolin (ANCEF) IVPB 2g/100 mL premix     2 g 200 mL/hr  over 30 Minutes Intravenous Every 6 hours 10/31/17 1645 11/01/17 0504   10/31/17 1015  ceFAZolin (ANCEF) IVPB 2g/100 mL premix     2 g 200 mL/hr over 30 Minutes Intravenous On call to O.R. 10/31/17 1015 10/31/17 1323     and started on DVT prophylaxis in the form of Xarelto.   PT and OT were ordered for total joint protocol.  Discharge planning consulted to  help with postop disposition and equipment needs.  Patient had a decent night on the evening of surgery.  They started to get up OOB with therapy on day one. Hemovac drain was pulled without difficulty.  Continued to work with therapy into day two.  Dressing was changed on day two and the incision was healing well. Patient was seen in rounds and was ready to go home following therapy.   Diet - Cardiac diet Follow up - in 2 weeks Activity - WBAT Disposition - Home Condition Upon Discharge - Stable D/C Meds - See DC Summary DVT Prophylaxis - Xarelto     Discharge Instructions    Call MD / Call 911   Complete by:  As directed    If you experience chest pain or shortness of breath, CALL 911 and be transported to the hospital emergency room.  If you develope a fever above 101 F, pus (white drainage) or increased drainage or redness at the wound, or calf pain, call your surgeon's office.   Change dressing   Complete by:  As directed    Change dressing daily with sterile 4 x 4 inch gauze dressing and apply TED hose. Do not submerge the incision under water.   Constipation Prevention   Complete by:  As directed    Drink plenty of fluids.  Prune juice may be helpful.  You may use a stool softener, such as Colace (over the counter) 100 mg twice a day.  Use MiraLax (over the counter) for constipation as needed.   Diet - low sodium heart healthy   Complete by:  As directed    Discharge instructions   Complete by:  As directed    Take Xarelto for two and a half more weeks, then discontinue Xarelto. Once the patient has completed the blood  thinner regimen, then take a Baby 81 mg Aspirin daily for three more weeks.   Pick up stool softner and laxative for home use following surgery while on pain medications. Do not submerge incision under water. Please use good hand washing techniques while changing dressing each day. May shower starting three days after surgery. Please use a clean towel to pat the incision dry following showers. Continue to use ice for pain and swelling after surgery. Do not use any lotions or creams on the incision until instructed by your surgeon.  Wear both TED hose on both legs during the day every day for three weeks, but may remove the TED hose at night at home.  Postoperative Constipation Protocol  Constipation - defined medically as fewer than three stools per week and severe constipation as less than one stool per week.  One of the most common issues patients have following surgery is constipation.  Even if you have a regular bowel pattern at home, your normal regimen is likely to be disrupted due to multiple reasons following surgery.  Combination of anesthesia, postoperative narcotics, change in appetite and fluid intake all can affect your bowels.  In order to avoid complications following surgery, here are some recommendations in order to help you during your recovery period.  Colace (docusate) - Pick up an over-the-counter form of Colace or another stool softener and take twice a day as long as you are requiring postoperative pain medications.  Take with a full glass of water daily.  If you experience loose stools or diarrhea, hold the colace until you stool forms back up.  If your symptoms do not get better within 1 week or if  they get worse, check with your doctor.  Dulcolax (bisacodyl) - Pick up over-the-counter and take as directed by the product packaging as needed to assist with the movement of your bowels.  Take with a full glass of water.  Use this product as needed if not relieved by Colace  only.   MiraLax (polyethylene glycol) - Pick up over-the-counter to have on hand.  MiraLax is a solution that will increase the amount of water in your bowels to assist with bowel movements.  Take as directed and can mix with a glass of water, juice, soda, coffee, or tea.  Take if you go more than two days without a movement. Do not use MiraLax more than once per day. Call your doctor if you are still constipated or irregular after using this medication for 7 days in a row.  If you continue to have problems with postoperative constipation, please contact the office for further assistance and recommendations.  If you experience "the worst abdominal pain ever" or develop nausea or vomiting, please contact the office immediatly for further recommendations for treatment.   Do not put a pillow under the knee. Place it under the heel.   Complete by:  As directed    Do not sit on low chairs, stoools or toilet seats, as it may be difficult to get up from low surfaces   Complete by:  As directed    Driving restrictions   Complete by:  As directed    No driving until released by the physician.   Increase activity slowly as tolerated   Complete by:  As directed    Lifting restrictions   Complete by:  As directed    No lifting until released by the physician.   Patient may shower   Complete by:  As directed    You may shower without a dressing once there is no drainage.  Do not wash over the wound.  If drainage remains, do not shower until drainage stops.   TED hose   Complete by:  As directed    Use stockings (TED hose) for 3 weeks on both leg(s).  You may remove them at night for sleeping.   Weight bearing as tolerated   Complete by:  As directed    Laterality:  right   Extremity:  Lower     Allergies as of 11/01/2017      Reactions   Penicillins Rash, Other (See Comments)   Has patient had a PCN reaction causing immediate rash, facial/tongue/throat swelling, SOB or lightheadedness with  hypotension: No Has patient had a PCN reaction causing severe rash involving mucus membranes or skin necrosis: No Has patient had a PCN reaction that required hospitalization: No Has patient had a PCN reaction occurring within the last 10 years: No If all of the above answers are "NO", then may proceed with Cephalosporin use.   Ace Inhibitors Cough   Dexilant [dexlansoprazole] Other (See Comments)   syncope   Sulfa Antibiotics Swelling      Medication List    STOP taking these medications   Biotin 1000 MCG tablet   CYANOCOBALAMIN PO   naproxen sodium 220 MG tablet Commonly known as:  ALEVE   VITRON-C PO     TAKE these medications   benzonatate 200 MG capsule Commonly known as:  TESSALON Take 200 mg by mouth 3 (three) times daily as needed for cough.   levocetirizine 5 MG tablet Commonly known as:  XYZAL Take 5 mg by mouth every  evening.   methocarbamol 500 MG tablet Commonly known as:  ROBAXIN Take 1 tablet (500 mg total) by mouth every 6 (six) hours as needed for muscle spasms.   montelukast 10 MG tablet Commonly known as:  SINGULAIR Take 10 mg by mouth at bedtime.   oxyCODONE 5 MG immediate release tablet Commonly known as:  Oxy IR/ROXICODONE Take 1-2 tablets (5-10 mg total) by mouth every 4 (four) hours as needed for moderate pain or severe pain.   pantoprazole 40 MG tablet Commonly known as:  PROTONIX Take 40 mg by mouth at bedtime.   rivaroxaban 10 MG Tabs tablet Commonly known as:  XARELTO Take 1 tablet (10 mg total) by mouth daily with breakfast. Take Xarelto for two and a half more weeks following discharge from the hospital, then discontinue Xarelto. Once the patient has completed the Xarelto, they may resume the 81 mg Aspirin. Start taking on:  11/02/2017   sucralfate 1 g tablet Commonly known as:  CARAFATE Take 1 g by mouth 2 (two) times daily.   traMADol 50 MG tablet Commonly known as:  ULTRAM Take 1-2 tablets (50-100 mg total) by mouth every 6  (six) hours as needed (mild pain). What changed:    how much to take  when to take this  reasons to take this            Discharge Care Instructions  (From admission, onward)        Start     Ordered   11/01/17 0000  Weight bearing as tolerated    Question Answer Comment  Laterality right   Extremity Lower      11/01/17 2212   11/01/17 0000  Change dressing    Comments:  Change dressing daily with sterile 4 x 4 inch gauze dressing and apply TED hose. Do not submerge the incision under water.   11/01/17 2212     Follow-up Information    Gaynelle Arabian, MD. Schedule an appointment as soon as possible for a visit on 11/15/2017.   Specialty:  Orthopedic Surgery Contact information: [address] [place] [address] [phone]           Signed: Arlee Muslim, PA-C Orthopaedic Surgery 11/01/2017, 10:14 PM

## 2017-11-01 NOTE — Progress Notes (Addendum)
   Subjective: 1 Day Post-Op Procedure(s) (LRB): RIGHT TOTAL KNEE ARTHROPLASTY (Right) Patient reports pain as mild and moderate.   Patient seen in rounds for Dr. Lequita HaltAluisio. Patient is well, but has had some minor complaints of pain in the knee, requiring pain medications We will resume therapy today. Probably home tomorrow. Plan is to go Home after hospital stay.  Objective: Vital signs in last 24 hours: Temp:  [97.6 F (36.4 C)-98.5 F (36.9 C)] 97.7 F (36.5 C) (01/29 0927) Pulse Rate:  [63-80] 64 (01/29 0927) Resp:  [12-25] 16 (01/29 0927) BP: (124-179)/(68-95) 148/76 (01/29 0927) SpO2:  [92 %-100 %] 97 % (01/29 0927)  Intake/Output from previous day:  Intake/Output Summary (Last 24 hours) at 11/01/2017 1302 Last data filed at 11/01/2017 0927 Gross per 24 hour  Intake 3698.75 ml  Output 2457 ml  Net 1241.75 ml    Intake/Output this shift: Total I/O In: 240 [P.O.:240] Out: 150 [Urine:150]  Labs: Recent Labs    11/01/17 0542  HGB 11.2*   Recent Labs    11/01/17 0542  WBC 12.6*  RBC 3.76*  HCT 34.1*  PLT 236   Recent Labs    11/01/17 0542  NA 138  K 4.0  CL 106  CO2 26  BUN 13  CREATININE 0.77  GLUCOSE 139*  CALCIUM 8.3*   No results for input(s): LABPT, INR in the last 72 hours.  EXAM General - Patient is Alert and Appropriate Extremity - Neurovascular intact Sensation intact distally Intact pulses distally Dorsiflexion/Plantar flexion intact Dressing - dressing C/D/I Motor Function - intact, moving foot and toes well on exam.  Hemovac pulled without difficulty.  Past Medical History:  Diagnosis Date  . Arthritis   . Dysphagia   . GERD (gastroesophageal reflux disease)   . History of hiatal hernia   . History of kidney stones    x2 ; passed independently  . Hyperlipidemia   . Hypertension   . Nausea     Assessment/Plan: 1 Day Post-Op Procedure(s) (LRB): RIGHT TOTAL KNEE ARTHROPLASTY (Right) Principal Problem:   OA  (osteoarthritis) of knee  Estimated body mass index is 36.33 kg/m as calculated from the following:   Height as of this encounter: 5' (1.524 m).   Weight as of this encounter: 84.4 kg (186 lb). Up with therapy  DVT Prophylaxis - Xarelto Weight-Bearing as tolerated to right leg D/C O2 and Pulse OX and try on Room Air  Avel Peacerew Jermery Caratachea, PA-C Orthopaedic Surgery 11/01/2017, 1:02 PM

## 2017-11-02 LAB — BASIC METABOLIC PANEL
Anion gap: 5 (ref 5–15)
BUN: 15 mg/dL (ref 6–20)
CHLORIDE: 107 mmol/L (ref 101–111)
CO2: 29 mmol/L (ref 22–32)
CREATININE: 0.9 mg/dL (ref 0.44–1.00)
Calcium: 8.5 mg/dL — ABNORMAL LOW (ref 8.9–10.3)
GFR calc non Af Amer: >60 mL/min (ref >60)
GLUCOSE: 102 mg/dL — AB (ref 65–99)
Potassium: 3.9 mmol/L (ref 3.5–5.1)
Sodium: 141 mmol/L (ref 135–145)

## 2017-11-02 LAB — CBC
HEMATOCRIT: 33.7 % — AB (ref 36.0–46.0)
HEMOGLOBIN: 10.8 g/dL — AB (ref 12.0–15.0)
MCH: 29.4 pg (ref 26.0–34.0)
MCHC: 32 g/dL (ref 30.0–36.0)
MCV: 91.8 fL (ref 78.0–100.0)
Platelets: 241 10*3/uL (ref 150–400)
RBC: 3.67 MIL/uL — ABNORMAL LOW (ref 3.87–5.11)
RDW: 13.4 % (ref 11.5–15.5)
WBC: 15.1 10*3/uL — ABNORMAL HIGH (ref 4.0–10.5)

## 2017-11-02 NOTE — Progress Notes (Signed)
Physical Therapy Treatment Patient Details Name: DAUNE DIVIRGILIO MRN: [id number] DOB: [birth year] Today's Date: 11/02/2017    History of Present Illness Pt s/p RTKA on 10/31/2017    PT Comments    POD # 2 am session Pt reports increased pain today.  With family present performed stair training.  Follow Up Recommendations  DC plan and follow up therapy as arranged by surgeon     Equipment Recommendations  None recommended by PT    Recommendations for Other Services       Precautions / Restrictions Precautions Precautions: Knee Precaution Comments: esp for stairs Required Braces or Orthoses: Knee Immobilizer - Right Restrictions Weight Bearing Restrictions: No Other Position/Activity Restrictions: WBAT    Mobility  Bed Mobility               General bed mobility comments: OOB in recliner  Transfers Overall transfer level: Needs assistance Equipment used: Rolling walker (2 wheeled) Transfers: Sit to/from Stand Sit to Stand: Supervision;Min guard         General transfer comment: assist to rise and stabilize  Ambulation/Gait Ambulation/Gait assistance: Supervision;Min guard Ambulation Distance (Feet): 35 Feet Assistive device: Rolling walker (2 wheeled) Gait Pattern/deviations: Step-to pattern;Decreased stance time - right Gait velocity: slow    General Gait Details: cues for sequences and safety with RW    Stairs Stairs: Yes   Stair Management: One rail Left Number of Stairs: 4 General stair comments: with family "hands on" instructed/performed with instruction on proper sequencing and need for KI for increased support.   Wheelchair Mobility    Modified Rankin (Stroke Patients Only)       Balance                                            Cognition Arousal/Alertness: Awake/alert Behavior During Therapy: WFL for tasks assessed/performed Overall Cognitive Status: Within Functional Limits for tasks assessed                                         Exercises      General Comments        Pertinent Vitals/Pain Pain Assessment: 0-10 Pain Score: 8  Pain Location: increased pain today Pain Descriptors / Indicators: Grimacing;Operative site guarding Pain Intervention(s): Monitored during session;Premedicated before session;Repositioned;Ice applied    Home Living                      Prior Function            PT Goals (current goals can now be found in the care plan section) Progress towards PT goals: Progressing toward goals    Frequency    7X/week      PT Plan Current plan remains appropriate    Co-evaluation              AM-PAC PT "6 Clicks" Daily Activity  Outcome Measure  Difficulty turning over in bed (including adjusting bedclothes, sheets and blankets)?: A Little Difficulty moving from lying on back to sitting on the side of the bed? : A Little Difficulty sitting down on and standing up from a chair with arms (e.g., wheelchair, bedside commode, etc,.)?: A Little Help needed moving to and from a bed to chair (including a wheelchair)?: A Little Help needed  walking in hospital room?: A Little Help needed climbing 3-5 steps with a railing? : A Lot 6 Click Score: 17    End of Session Equipment Utilized During Treatment: Gait belt;Right knee immobilizer Activity Tolerance: Patient tolerated treatment well Patient left: in chair;with call bell/phone within reach;with family/visitor present Nurse Communication: (pt ready for D/C to home) PT Visit Diagnosis: Other abnormalities of gait and mobility (R26.89)     Time: 1345-1410 PT Time Calculation (min) (ACUTE ONLY): 25 min  Charges:  $Gait Training: 8-22 mins $Therapeutic Activity: 8-22 mins                    G Codes:       Felecia ShellingLori Alazae Crymes  PTA [facility]  Acute  Rehab Pager      [phone]

## 2017-11-02 NOTE — Progress Notes (Signed)
   Subjective: 2 Days Post-Op Procedure(s) (LRB): RIGHT TOTAL KNEE ARTHROPLASTY (Right) Patient reports pain as mild.   Patient seen in rounds by Dr. Lequita HaltAluisio. Patient is well, but has had some minor complaints of pain in the knee, requiring pain medications Patient is ready to go home  Objective: Vital signs in last 24 hours: Temp:  [97.7 F (36.5 C)-98.3 F (36.8 C)] 98 F (36.7 C) (01/30 0500) Pulse Rate:  [64-74] 67 (01/30 0500) Resp:  [16] 16 (01/30 0500) BP: (142-181)/(59-87) 181/87 (01/30 0500) SpO2:  [95 %-97 %] 95 % (01/30 0500)  Intake/Output from previous day:  Intake/Output Summary (Last 24 hours) at 11/02/2017 0712 Last data filed at 11/02/2017 0619 Gross per 24 hour  Intake 840 ml  Output 1550 ml  Net -710 ml    Intake/Output this shift: No intake/output data recorded.  Labs: Recent Labs    11/01/17 0542 11/02/17 0545  HGB 11.2* 10.8*   Recent Labs    11/01/17 0542 11/02/17 0545  WBC 12.6* 15.1*  RBC 3.76* 3.67*  HCT 34.1* 33.7*  PLT 236 241   Recent Labs    11/01/17 0542  NA 138  K 4.0  CL 106  CO2 26  BUN 13  CREATININE 0.77  GLUCOSE 139*  CALCIUM 8.3*   No results for input(s): LABPT, INR in the last 72 hours.  EXAM: General - Patient is Alert and Appropriate Extremity - Neurovascular intact Sensation intact distally Intact pulses distally Incision - clean, dry Motor Function - intact, moving foot and toes well on exam.   Assessment/Plan: 2 Days Post-Op Procedure(s) (LRB): RIGHT TOTAL KNEE ARTHROPLASTY (Right) Procedure(s) (LRB): RIGHT TOTAL KNEE ARTHROPLASTY (Right) Past Medical History:  Diagnosis Date  . Arthritis   . Dysphagia   . GERD (gastroesophageal reflux disease)   . History of hiatal hernia   . History of kidney stones    x2 ; passed independently  . Hyperlipidemia   . Hypertension   . Nausea    Principal Problem:   OA (osteoarthritis) of knee  Estimated body mass index is 36.33 kg/m as calculated from  the following:   Height as of this encounter: 5' (1.524 m).   Weight as of this encounter: 84.4 kg (186 lb). Up with therapy Diet - Cardiac diet Follow up - in 2 weeks Activity - WBAT Disposition - Home Condition Upon Discharge - Stable D/C Meds - See DC Summary DVT Prophylaxis - Xarelto  Cheryl Peacerew Faelyn Sigler, PA-C Orthopaedic Surgery 11/02/2017, 7:12 AM

## 2017-11-02 NOTE — Progress Notes (Signed)
Occupational Therapy Treatment Patient Details Name: Cheryl Goodman MRN: [id number] DOB: [birth year] Today's Date: 11/02/2017    History of present illness Pt s/p RTKA on 10/31/2017   OT comments  Pt much more painful today and had nausea with movement. Unable to perform SLR.  Used KI for toilet transfer   Follow Up Recommendations  Supervision/Assistance - 24 hour    Equipment Recommendations  None recommended by OT    Recommendations for Other Services      Precautions / Restrictions Precautions Precautions: Knee Precaution Comments: used KI; unable to do SLR today Required Braces or Orthoses: Knee Immobilizer - Right Knee Immobilizer - Right: On when out of bed or walking;Discontinue once straight leg raise with < 10 degree lag Restrictions Other Position/Activity Restrictions: WBAT       Mobility Bed Mobility         Supine to sit: Min assist     General bed mobility comments: for RLE  Transfers   Equipment used: Rolling walker (2 wheeled)   Sit to Stand: Min assist         General transfer comment: assist to rise and stabilize    Balance                                           ADL either performed or assessed with clinical judgement   ADL                           Toilet Transfer: Minimal assistance;Ambulation;BSC;RW   Toileting- Clothing Manipulation and Hygiene: Minimal assistance;Sit to/from stand         General ADL Comments: pt ambulated to bathroom.  Extra time. Supported leg on trash can as foot did not touch floor. KI used this session.  Educated Sports coach on application.  Pt felt nauseous in the bathroom; brought recliner to door. Educated on shower transfer, but pt was not ready to perform yet     Vision       Perception     Praxis      Cognition Arousal/Alertness: Awake/alert Behavior During Therapy: WFL for tasks assessed/performed Overall Cognitive Status: Within Functional Limits for  tasks assessed                                          Exercises     Shoulder Instructions       General Comments      Pertinent Vitals/ Pain       Pain Score: 8  Pain Location: R thigh and knee Pain Descriptors / Indicators: Aching Pain Intervention(s): Limited activity within patient's tolerance;Monitored during session;Premedicated before session;Repositioned;Ice applied  Home Living                                          Prior Functioning/Environment              Frequency           Progress Toward Goals  OT Goals(current goals can now be found in the care plan section)  Progress towards OT goals: Progressing toward goals  Acute Rehab OT Goals Time For Goal Achievement: 11/16/17  Plan      Co-evaluation                 AM-PAC PT "6 Clicks" Daily Activity     Outcome Measure   Help from another person eating meals?: None Help from another person taking care of personal grooming?: A Little Help from another person toileting, which includes using toliet, bedpan, or urinal?: A Little Help from another person bathing (including washing, rinsing, drying)?: A Lot Help from another person to put on and taking off regular upper body clothing?: A Little Help from another person to put on and taking off regular lower body clothing?: A Lot 6 Click Score: 17    End of Session    OT Visit Diagnosis: Pain Pain - Right/Left: Right Pain - part of body: Knee   Activity Tolerance Patient tolerated treatment well   Patient Left in chair;with call bell/phone within reach;with family/visitor present   Nurse Communication (ck L lower leg:  fluid)        Time: [id number] OT Time Calculation (min): 40 min  Charges: OT General Charges $OT Visit: 1 Visit OT Treatments $Self Care/Home Management : 23-37 mins  Cheryl Goodman, OTR/L [phone] 11/02/2017   Cheryl Goodman 11/02/2017, 12:45 PM

## 2017-12-22 ENCOUNTER — Other Ambulatory Visit: Payer: Self-pay | Admitting: Obstetrics & Gynecology

## 2017-12-22 DIAGNOSIS — Z1231 Encounter for screening mammogram for malignant neoplasm of breast: Secondary | ICD-10-CM

## 2018-01-02 ENCOUNTER — Ambulatory Visit
Admission: RE | Admit: 2018-01-02 | Discharge: 2018-01-02 | Disposition: A | Discharge status: Home / Self Care | Payer: Medicare Other | Source: Ambulatory Visit | Attending: Obstetrics & Gynecology | Admitting: Obstetrics & Gynecology

## 2018-01-02 DIAGNOSIS — Z1231 Encounter for screening mammogram for malignant neoplasm of breast: Secondary | ICD-10-CM

## 2018-01-10 ENCOUNTER — Ambulatory Visit: Payer: Medicare Other

## 2018-04-07 ENCOUNTER — Encounter: Payer: Self-pay | Admitting: Obstetrics & Gynecology

## 2018-04-07 ENCOUNTER — Ambulatory Visit (INDEPENDENT_AMBULATORY_CARE_PROVIDER_SITE_OTHER): Payer: Medicare Other | Admitting: Obstetrics & Gynecology

## 2018-04-07 VITALS — BP 118/78 | Ht 59.0 in | Wt 178.0 lb

## 2018-04-07 DIAGNOSIS — Z01419 Encounter for gynecological examination (general) (routine) without abnormal findings: Secondary | ICD-10-CM

## 2018-04-07 DIAGNOSIS — M810 Age-related osteoporosis without current pathological fracture: Secondary | ICD-10-CM

## 2018-04-07 DIAGNOSIS — R3915 Urgency of urination: Secondary | ICD-10-CM | POA: Diagnosis not present

## 2018-04-07 DIAGNOSIS — N816 Rectocele: Secondary | ICD-10-CM

## 2018-04-07 DIAGNOSIS — Z90710 Acquired absence of both cervix and uterus: Secondary | ICD-10-CM

## 2018-04-07 DIAGNOSIS — Z780 Asymptomatic menopausal state: Secondary | ICD-10-CM

## 2018-04-07 NOTE — Patient Instructions (Signed)
1. Well female exam with routine gynecological exam Gynecologic exam status post hysterectomy and menopause.  Pap test negative in March 2016.  No need to repeat Pap test.  Breast exam normal.  Negative screening mammography in April 2019.  Health labs with family physician.  Follow-up and management of osteoporosis with family physician.  Last colonoscopy in 2015.  2. S/P hysterectomy  3. Menopause present Well on no hormone replacement therapy.  4. Urinary urgency Urinary urgency with significant socially embarrassing incontinence.  Counseling done on avoiding caffeine products and increasing water intake.  Also recommended frequent regular micturitions to avoid overfilling the bladder.  Refer to urology for assessment and management.  5. Rectocele Grade 1/3 as symptomatic rectocele.  Treat constipation to avoid pressure and progression of the rectocele.  6. Age-related osteoporosis without current pathological fracture Osteoporosis on Reclast.  Follow-up bone density in September 2019 with family physician.  Will consider Prolia at that time.  Recommend vitamin D supplements, calcium rich nutrition and weightbearing physical activity when her right knee replacement allows.  Diamond NickelBeatrice, it was a pleasure seeing you today!  You will receive a phone call from us to organize your referral to urology.

## 2018-04-07 NOTE — Progress Notes (Signed)
Cheryl Goodman 1941-05-08 [id number]   History:    77 y.o. G5P5L5  Married  RP:  Established patient presenting for annual gyn exam   HPI: Status post total hysterectomy.  Menopause, well without hormone replacement therapy.  No pelvic pain.  Occasionally sexually active.  Complains of significant socially embarrassing urinary urgency with leakage.  No stress urinary incontinence.  Bowel movement with tendency for constipation.  Breasts normal.  Body mass index 35.95.  Right knee replacement this year.  Health labs with family physician.  Osteoporosis also followed by family physician.  Patient has been on Reclast and will repeat a bone density in September 2019.  Colonoscopy 2015.  Past medical history,surgical history, family history and social history were all reviewed and documented in the EPIC chart.  Gynecologic History No LMP recorded. Patient has had a hysterectomy. Contraception: status post hysterectomy Last Pap: 12/2014. Results were: Negative Last mammogram: 01/2018. Results were: Negative Bone Density: 2 yrs ago.  Osteoporosis on Reclast.  F/U with Dr Hyacinth MeekerMiller scheduled 06/2018. Colonoscopy: 2015  Obstetric History OB History  Gravida Para Term Preterm AB Living  5 1     0 5  SAB TAB Ectopic Multiple Live Births      0        # Outcome Date GA Lbr Len/2nd Weight Sex Delivery Anes PTL Lv  5 Gravida           4 Gravida           3 Gravida           2 Gravida           1 Para              ROS: A ROS was performed and pertinent positives and negatives are included in the history.  GENERAL: No fevers or chills. HEENT: No change in vision, no earache, sore throat or sinus congestion. NECK: No pain or stiffness. CARDIOVASCULAR: No chest pain or pressure. No palpitations. PULMONARY: No shortness of breath, cough or wheeze. GASTROINTESTINAL: No abdominal pain, nausea, vomiting or diarrhea, melena or bright red blood per rectum. GENITOURINARY: No urinary frequency, urgency,  hesitancy or dysuria. MUSCULOSKELETAL: No joint or muscle pain, no back pain, no recent trauma. DERMATOLOGIC: No rash, no itching, no lesions. ENDOCRINE: No polyuria, polydipsia, no heat or cold intolerance. No recent change in weight. HEMATOLOGICAL: No anemia or easy bruising or bleeding. NEUROLOGIC: No headache, seizures, numbness, tingling or weakness. PSYCHIATRIC: No depression, no loss of interest in normal activity or change in sleep pattern.     Exam:   BP 118/78 (BP Location: Right Arm, Patient Position: Sitting, Cuff Size: Normal)   Ht 4\' 11"  (1.499 m)   Wt 178 lb (80.7 kg)   BMI 35.95 kg/m   Body mass index is 35.95 kg/m.  General appearance : Well developed well nourished female. No acute distress HEENT: Eyes: no retinal hemorrhage or exudates,  Neck supple, trachea midline, no carotid bruits, no thyroidmegaly Lungs: Clear to auscultation, no rhonchi or wheezes, or rib retractions  Heart: Regular rate and rhythm, no murmurs or gallops Breast:Examined in sitting and supine position were symmetrical in appearance, no palpable masses or tenderness,  no skin retraction, no nipple inversion, no nipple discharge, no skin discoloration, no axillary or supraclavicular lymphadenopathy Abdomen: no palpable masses or tenderness, no rebound or guarding Extremities: no edema or skin discoloration or tenderness  Pelvic: Vulva: Normal  Vagina: No gross lesions or discharge.  Rectocele grade 1/3.  Cervix/Uterus absent  Adnexa  Without masses or tenderness  Anus: Normal   Assessment/Plan:  77 y.o. female for annual exam   1. Well female exam with routine gynecological exam Gynecologic exam status post hysterectomy and menopause.  Pap test negative in March 2016.  No need to repeat Pap test.  Breast exam normal.  Negative screening mammography in April 2019.  Health labs with family physician.  Follow-up and management of osteoporosis with family physician.  Last colonoscopy in  2015.  2. S/P hysterectomy  3. Menopause present Well on no hormone replacement therapy.  4. Urinary urgency Urinary urgency with significant socially embarrassing incontinence.  Counseling done on avoiding caffeine products and increasing water intake.  Also recommended frequent regular micturitions to avoid overfilling the bladder.  Refer to urology for assessment and management.  5. Rectocele Grade 1/3 as symptomatic rectocele.  Treat constipation to avoid pressure and progression of the rectocele.  6. Age-related osteoporosis without current pathological fracture Osteoporosis on Reclast.  Follow-up bone density in September 2019 with family physician.  Will consider Prolia at that time.  Recommend vitamin D supplements, calcium rich nutrition and weightbearing physical activity when her right knee replacement allows.  Counseling on above issues and coordination of care more than 50% for 15 minutes.  Genia Del MD, 9:16 AM 04/07/2018

## 2018-04-10 ENCOUNTER — Telehealth: Payer: Self-pay | Admitting: *Deleted

## 2018-04-10 NOTE — Telephone Encounter (Signed)
Notes faxed to [facility] they will contact patient to schedule.

## 2018-04-10 NOTE — Telephone Encounter (Signed)
-----   Message from Genia DelMarie-Lyne Lavoie, MD sent at 04/07/2018  9:37 AM EDT ----- Regarding: Refer to Urology Urinary Urgency with severe incontinence.

## 2018-04-20 NOTE — Telephone Encounter (Signed)
pateint declined visit at time with urology, states she is better now.

## 2018-08-22 DIAGNOSIS — G4733 Obstructive sleep apnea (adult) (pediatric): Secondary | ICD-10-CM | POA: Insufficient documentation

## 2018-08-22 DIAGNOSIS — K219 Gastro-esophageal reflux disease without esophagitis: Secondary | ICD-10-CM | POA: Insufficient documentation

## 2018-10-19 DIAGNOSIS — G4733 Obstructive sleep apnea (adult) (pediatric): Secondary | ICD-10-CM | POA: Diagnosis not present

## 2018-10-23 DIAGNOSIS — G4733 Obstructive sleep apnea (adult) (pediatric): Secondary | ICD-10-CM | POA: Diagnosis not present

## 2018-10-23 DIAGNOSIS — R05 Cough: Secondary | ICD-10-CM | POA: Diagnosis not present

## 2018-10-23 DIAGNOSIS — K219 Gastro-esophageal reflux disease without esophagitis: Secondary | ICD-10-CM | POA: Diagnosis not present

## 2018-10-31 DIAGNOSIS — M1712 Unilateral primary osteoarthritis, left knee: Secondary | ICD-10-CM | POA: Diagnosis not present

## 2018-10-31 DIAGNOSIS — Z9989 Dependence on other enabling machines and devices: Secondary | ICD-10-CM | POA: Diagnosis not present

## 2018-10-31 DIAGNOSIS — G4733 Obstructive sleep apnea (adult) (pediatric): Secondary | ICD-10-CM | POA: Diagnosis not present

## 2018-10-31 DIAGNOSIS — K219 Gastro-esophageal reflux disease without esophagitis: Secondary | ICD-10-CM | POA: Diagnosis not present

## 2018-11-06 DIAGNOSIS — M5116 Intervertebral disc disorders with radiculopathy, lumbar region: Secondary | ICD-10-CM | POA: Diagnosis not present

## 2018-11-06 DIAGNOSIS — R1084 Generalized abdominal pain: Secondary | ICD-10-CM | POA: Diagnosis not present

## 2018-11-14 ENCOUNTER — Other Ambulatory Visit (HOSPITAL_COMMUNITY): Payer: Medicare Other

## 2018-11-19 DIAGNOSIS — G4733 Obstructive sleep apnea (adult) (pediatric): Secondary | ICD-10-CM | POA: Diagnosis not present

## 2018-11-20 ENCOUNTER — Ambulatory Visit: Admit: 2018-11-20 | Payer: Medicare Other | Admitting: Orthopedic Surgery

## 2018-11-20 SURGERY — ARTHROPLASTY, KNEE, TOTAL
Anesthesia: Choice | Laterality: Left

## 2018-12-18 DIAGNOSIS — G4733 Obstructive sleep apnea (adult) (pediatric): Secondary | ICD-10-CM | POA: Diagnosis not present

## 2019-01-18 DIAGNOSIS — G4733 Obstructive sleep apnea (adult) (pediatric): Secondary | ICD-10-CM | POA: Diagnosis not present

## 2019-02-17 DIAGNOSIS — G4733 Obstructive sleep apnea (adult) (pediatric): Secondary | ICD-10-CM | POA: Diagnosis not present

## 2019-02-28 DIAGNOSIS — H52203 Unspecified astigmatism, bilateral: Secondary | ICD-10-CM | POA: Diagnosis not present

## 2019-02-28 DIAGNOSIS — H43813 Vitreous degeneration, bilateral: Secondary | ICD-10-CM | POA: Diagnosis not present

## 2019-02-28 DIAGNOSIS — H04123 Dry eye syndrome of bilateral lacrimal glands: Secondary | ICD-10-CM | POA: Diagnosis not present

## 2019-02-28 DIAGNOSIS — H2513 Age-related nuclear cataract, bilateral: Secondary | ICD-10-CM | POA: Diagnosis not present

## 2019-03-20 DIAGNOSIS — G4733 Obstructive sleep apnea (adult) (pediatric): Secondary | ICD-10-CM | POA: Diagnosis not present

## 2019-03-22 DIAGNOSIS — D500 Iron deficiency anemia secondary to blood loss (chronic): Secondary | ICD-10-CM | POA: Diagnosis not present

## 2019-03-29 DIAGNOSIS — Z9989 Dependence on other enabling machines and devices: Secondary | ICD-10-CM | POA: Diagnosis not present

## 2019-03-29 DIAGNOSIS — E782 Mixed hyperlipidemia: Secondary | ICD-10-CM | POA: Diagnosis not present

## 2019-03-29 DIAGNOSIS — Z86010 Personal history of colonic polyps: Secondary | ICD-10-CM | POA: Diagnosis not present

## 2019-03-29 DIAGNOSIS — Z0000 Encounter for general adult medical examination without abnormal findings: Secondary | ICD-10-CM | POA: Diagnosis not present

## 2019-03-29 DIAGNOSIS — D500 Iron deficiency anemia secondary to blood loss (chronic): Secondary | ICD-10-CM | POA: Diagnosis not present

## 2019-03-29 DIAGNOSIS — G4733 Obstructive sleep apnea (adult) (pediatric): Secondary | ICD-10-CM | POA: Diagnosis not present

## 2019-04-12 DIAGNOSIS — R195 Other fecal abnormalities: Secondary | ICD-10-CM | POA: Diagnosis not present

## 2019-04-12 DIAGNOSIS — R1010 Upper abdominal pain, unspecified: Secondary | ICD-10-CM | POA: Diagnosis not present

## 2019-04-12 DIAGNOSIS — Z86010 Personal history of colonic polyps: Secondary | ICD-10-CM | POA: Diagnosis not present

## 2019-04-19 DIAGNOSIS — H2512 Age-related nuclear cataract, left eye: Secondary | ICD-10-CM | POA: Diagnosis not present

## 2019-04-19 DIAGNOSIS — H25012 Cortical age-related cataract, left eye: Secondary | ICD-10-CM | POA: Diagnosis not present

## 2019-04-19 DIAGNOSIS — H25812 Combined forms of age-related cataract, left eye: Secondary | ICD-10-CM | POA: Diagnosis not present

## 2019-04-19 DIAGNOSIS — H52222 Regular astigmatism, left eye: Secondary | ICD-10-CM | POA: Diagnosis not present

## 2019-04-19 DIAGNOSIS — G4733 Obstructive sleep apnea (adult) (pediatric): Secondary | ICD-10-CM | POA: Diagnosis not present

## 2019-04-25 DIAGNOSIS — G4733 Obstructive sleep apnea (adult) (pediatric): Secondary | ICD-10-CM | POA: Diagnosis not present

## 2019-05-11 DIAGNOSIS — H35352 Cystoid macular degeneration, left eye: Secondary | ICD-10-CM | POA: Diagnosis not present

## 2019-05-20 DIAGNOSIS — G4733 Obstructive sleep apnea (adult) (pediatric): Secondary | ICD-10-CM | POA: Diagnosis not present

## 2019-06-20 DIAGNOSIS — G4733 Obstructive sleep apnea (adult) (pediatric): Secondary | ICD-10-CM | POA: Diagnosis not present

## 2019-07-16 ENCOUNTER — Other Ambulatory Visit: Payer: Self-pay

## 2019-07-16 ENCOUNTER — Inpatient Hospital Stay
Admission: EM | Admit: 2019-07-16 | Discharge: 2019-07-18 | DRG: 378 | Disposition: A | Discharge status: Home / Self Care | Payer: Medicare HMO | Attending: Internal Medicine | Admitting: Internal Medicine

## 2019-07-16 ENCOUNTER — Encounter: Payer: Self-pay | Admitting: Emergency Medicine

## 2019-07-16 DIAGNOSIS — E669 Obesity, unspecified: Secondary | ICD-10-CM | POA: Diagnosis present

## 2019-07-16 DIAGNOSIS — K254 Chronic or unspecified gastric ulcer with hemorrhage: Principal | ICD-10-CM | POA: Diagnosis present

## 2019-07-16 DIAGNOSIS — K922 Gastrointestinal hemorrhage, unspecified: Secondary | ICD-10-CM | POA: Diagnosis present

## 2019-07-16 DIAGNOSIS — Z20828 Contact with and (suspected) exposure to other viral communicable diseases: Secondary | ICD-10-CM | POA: Diagnosis present

## 2019-07-16 DIAGNOSIS — E785 Hyperlipidemia, unspecified: Secondary | ICD-10-CM | POA: Diagnosis present

## 2019-07-16 DIAGNOSIS — I10 Essential (primary) hypertension: Secondary | ICD-10-CM | POA: Diagnosis not present

## 2019-07-16 DIAGNOSIS — D649 Anemia, unspecified: Secondary | ICD-10-CM

## 2019-07-16 DIAGNOSIS — D62 Acute posthemorrhagic anemia: Secondary | ICD-10-CM | POA: Diagnosis not present

## 2019-07-16 DIAGNOSIS — Z888 Allergy status to other drugs, medicaments and biological substances status: Secondary | ICD-10-CM | POA: Diagnosis not present

## 2019-07-16 DIAGNOSIS — K2960 Other gastritis without bleeding: Secondary | ICD-10-CM | POA: Diagnosis not present

## 2019-07-16 DIAGNOSIS — K921 Melena: Secondary | ICD-10-CM

## 2019-07-16 DIAGNOSIS — K449 Diaphragmatic hernia without obstruction or gangrene: Secondary | ICD-10-CM | POA: Diagnosis not present

## 2019-07-16 DIAGNOSIS — R0600 Dyspnea, unspecified: Secondary | ICD-10-CM | POA: Diagnosis not present

## 2019-07-16 DIAGNOSIS — Z6837 Body mass index (BMI) 37.0-37.9, adult: Secondary | ICD-10-CM | POA: Diagnosis not present

## 2019-07-16 DIAGNOSIS — K3189 Other diseases of stomach and duodenum: Secondary | ICD-10-CM | POA: Diagnosis not present

## 2019-07-16 DIAGNOSIS — R55 Syncope and collapse: Secondary | ICD-10-CM | POA: Diagnosis not present

## 2019-07-16 DIAGNOSIS — T39395A Adverse effect of other nonsteroidal anti-inflammatory drugs [NSAID], initial encounter: Secondary | ICD-10-CM | POA: Diagnosis present

## 2019-07-16 DIAGNOSIS — M1990 Unspecified osteoarthritis, unspecified site: Secondary | ICD-10-CM | POA: Diagnosis not present

## 2019-07-16 DIAGNOSIS — Z882 Allergy status to sulfonamides status: Secondary | ICD-10-CM | POA: Diagnosis not present

## 2019-07-16 DIAGNOSIS — Z880 Allergy status to penicillin: Secondary | ICD-10-CM | POA: Diagnosis not present

## 2019-07-16 DIAGNOSIS — Z803 Family history of malignant neoplasm of breast: Secondary | ICD-10-CM | POA: Diagnosis not present

## 2019-07-16 DIAGNOSIS — Z96651 Presence of right artificial knee joint: Secondary | ICD-10-CM | POA: Diagnosis not present

## 2019-07-16 DIAGNOSIS — K2100 Gastro-esophageal reflux disease with esophagitis, without bleeding: Secondary | ICD-10-CM | POA: Diagnosis present

## 2019-07-16 DIAGNOSIS — K219 Gastro-esophageal reflux disease without esophagitis: Secondary | ICD-10-CM

## 2019-07-16 MED ORDER — SODIUM CHLORIDE 0.9 % IV SOLN
10.0000 mL/h | Freq: Once | INTRAVENOUS | Status: AC
  Administered 2019-07-16: 10 mL/h via INTRAVENOUS

## 2019-07-16 MED ORDER — POLYETHYLENE GLYCOL 3350 17 G PO PACK: 17.0000 g | PACK | Freq: Every day | ORAL | Status: DC | PRN

## 2019-07-16 MED ORDER — ONDANSETRON HCL 4 MG/2ML IJ SOLN
4.0000 mg | Freq: Once | INTRAMUSCULAR | Status: AC
  Administered 2019-07-16: 19:00:00 4 mg via INTRAVENOUS
  Filled 2019-07-16: qty 2

## 2019-07-16 MED ORDER — SODIUM CHLORIDE 0.9 % IV SOLN
8.0000 mg/h | INTRAVENOUS | Status: DC
  Administered 2019-07-16 – 2019-07-17 (×3): 8 mg/h via INTRAVENOUS
  Filled 2019-07-16 (×3): qty 80

## 2019-07-16 MED ORDER — ALBUTEROL SULFATE (2.5 MG/3ML) 0.083% IN NEBU: 2.5000 mg | INHALATION_SOLUTION | RESPIRATORY_TRACT | Status: DC | PRN

## 2019-07-16 MED ORDER — ONDANSETRON HCL 4 MG/2ML IJ SOLN: 4.0000 mg | Freq: Four times a day (QID) | INTRAMUSCULAR | Status: DC | PRN

## 2019-07-16 MED ORDER — SODIUM CHLORIDE 0.9 % IV SOLN
1.0000 g | Freq: Once | INTRAVENOUS | Status: AC
  Administered 2019-07-16: 1 g via INTRAVENOUS
  Filled 2019-07-16: qty 10

## 2019-07-16 MED ORDER — ACETAMINOPHEN 325 MG PO TABS
650.0000 mg | ORAL_TABLET | Freq: Four times a day (QID) | ORAL | Status: DC | PRN
  Administered 2019-07-17: 650 mg via ORAL
  Filled 2019-07-16: qty 2

## 2019-07-16 MED ORDER — ONDANSETRON HCL 4 MG PO TABS: 4.0000 mg | ORAL_TABLET | Freq: Four times a day (QID) | ORAL | Status: DC | PRN

## 2019-07-16 MED ORDER — PANTOPRAZOLE SODIUM 40 MG IV SOLR: 40.0000 mg | Freq: Two times a day (BID) | INTRAVENOUS | Status: DC

## 2019-07-16 MED ORDER — SODIUM CHLORIDE 0.9% FLUSH
3.0000 mL | Freq: Two times a day (BID) | INTRAVENOUS | Status: DC
  Administered 2019-07-16: 23:00:00 3 mL via INTRAVENOUS

## 2019-07-16 MED ORDER — SODIUM CHLORIDE 0.9 % IV SOLN
80.0000 mg | Freq: Once | INTRAVENOUS | Status: AC
  Administered 2019-07-16: 80 mg via INTRAVENOUS
  Filled 2019-07-16: qty 80

## 2019-07-16 MED ORDER — ACETAMINOPHEN 650 MG RE SUPP: 650.0000 mg | Freq: Four times a day (QID) | RECTAL | Status: DC | PRN

## 2019-07-16 NOTE — ED Notes (Signed)
Attempted for IV at R hand.

## 2019-07-16 NOTE — ED Notes (Signed)
Patient states she was not feeling well with dizziness and weakness and went to see PCP. They did blood work this afternoon called patient and stated she needed to come to ER for blood transfusion.

## 2019-07-16 NOTE — ED Notes (Signed)
ED Provider at bedside. 

## 2019-07-16 NOTE — ED Notes (Signed)
Report given to floor RN

## 2019-07-16 NOTE — ED Triage Notes (Signed)
First Nurse Note:  Arrives for ED evaluation of anemia.  Patient states she was sent to ED for a blood transfusion.  Blood work from today from [facility] show HGB:  6.2  HCT:  22.  Patient is AAOx3.  Skin warm and dry. NAD

## 2019-07-16 NOTE — ED Provider Notes (Signed)
[facility] Emergency Department Provider Note   ____________________________________________   I have reviewed the triage vital signs and the nursing notes.   HISTORY  Chief Complaint Weakness  History limited by: Not Limited   HPI Cheryl Goodman is a 78 y.o. female who presents to the emergency department today at the request of her primary care physician because of anemia found on blood work performed today.  She went to primary care today because of concerns for some weakness and shortness of breath.  Symptoms have been ongoing for the past couple of weeks.  During this time she is also noticed intermittent episodes of black tarry stool.  She has had some abdominal discomfort.  She has been taking some ibuprofen.  She states she has had similar symptoms in the past.  She is on antacids.  Denies being on any blood thinners.   Records reviewed. Per medical record review patient has a history of erosive gastritis.   Past Medical History:  Diagnosis Date  . Arthritis   . Dysphagia   . GERD (gastroesophageal reflux disease)   . History of hiatal hernia   . History of kidney stones    x2 ; passed independently  . Hyperlipidemia   . Hypertension   . Nausea     Patient Active Problem List   Diagnosis Date Noted  . OA (osteoarthritis) of knee 10/31/2017    Past Surgical History:  Procedure Laterality Date  . ABDOMINAL HYSTERECTOMY    . APPENDECTOMY    . BREAST BIOPSY Right   . BREAST SURGERY    . CARPAL TUNNEL RELEASE    . CHOLECYSTECTOMY    . COLONOSCOPY WITH PROPOFOL N/A 09/01/2015   Procedure: COLONOSCOPY WITH PROPOFOL;  Surgeon: Manya Silvas, MD;  Location: [facility] ENDOSCOPY;  Service: Endoscopy;  Laterality: N/A;  . REDUCTION MAMMAPLASTY Bilateral   . SAVORY DILATION N/A 09/01/2015   Procedure: SAVORY DILATION;  Surgeon: Manya Silvas, MD;  Location: [facility] ENDOSCOPY;  Service: Endoscopy;  Laterality: N/A;  . TOTAL KNEE ARTHROPLASTY  Right 10/31/2017   Procedure: RIGHT TOTAL KNEE ARTHROPLASTY;  Surgeon: Gaynelle Arabian, MD;  Location: [facility] ORS;  Service: Orthopedics;  Laterality: Right;    Prior to Admission medications   Medication Sig Start Date End Date Taking? Authorizing Provider  levocetirizine (XYZAL) 5 MG tablet Take 5 mg by mouth as needed.  09/08/17   [provider]  montelukast (SINGULAIR) 10 MG tablet Take 10 mg by mouth at bedtime. 07/08/17   [provider]  pantoprazole (PROTONIX) 40 MG tablet Take 40 mg by mouth at bedtime.     [provider]  sucralfate (CARAFATE) 1 g tablet Take 1 g by mouth 2 (two) times daily.    [provider]  traMADol (ULTRAM) 50 MG tablet Take 1-2 tablets (50-100 mg total) by mouth every 6 (six) hours as needed (mild pain). 11/01/17   Perkins, Alexzandrew L, PA-C    Allergies Penicillins, Ace inhibitors, Dexilant [dexlansoprazole], and Sulfa antibiotics  Family History  Problem Relation Age of Onset  . Breast cancer Maternal Grandmother        in 73's    Social History Social History   Tobacco Use  . Smoking status: Never Smoker  . Smokeless tobacco: Never Used  Substance Use Topics  . Alcohol use: No  . Drug use: No    Review of Systems Constitutional: No fever/chills Eyes: No visual changes. ENT: No sore throat. Cardiovascular: Denies chest pain. Respiratory: Positive  for shortness of breath. Gastrointestinal: Positive for abdominal pain. Positive for black tarry stool.  Genitourinary: Negative for dysuria. Musculoskeletal: Negative for back pain. Skin: Negative for rash. Neurological: Negative for headaches, focal weakness or numbness.  ____________________________________________   PHYSICAL EXAM:  VITAL SIGNS: ED Triage Vitals  Enc Vitals Group     BP 07/16/19 1830 (!) 182/81     Pulse Rate 07/16/19 1830 88     Resp 07/16/19 1830 16     Temp 07/16/19 1830 98.4 F (36.9 C)     Temp Source 07/16/19 1830 Oral      SpO2 07/16/19 1830 99 %     Weight 07/16/19 1831 179 lb (81.2 kg)     Height 07/16/19 1831 4\' 11"  (1.499 m)     Head Circumference --      Peak Flow --      Pain Score 07/16/19 1834 0   Constitutional: Alert and oriented.  Eyes: Conjunctivae are normal.  ENT      Head: Normocephalic and atraumatic.      Nose: No congestion/rhinnorhea.      Mouth/Throat: Mucous membranes are moist.      Neck: No stridor. Hematological/Lymphatic/Immunilogical: No cervical lymphadenopathy. Cardiovascular: Normal rate, regular rhythm.  No murmurs, rubs, or gallops.  Respiratory: Normal respiratory effort without tachypnea nor retractions. Breath sounds are clear and equal bilaterally. No wheezes/rales/rhonchi. Gastrointestinal: Soft and non tender. No rebound. No guarding.  Genitourinary: Deferred Musculoskeletal: Normal range of motion in all extremities. No lower extremity edema. Neurologic:  Normal speech and language. No gross focal neurologic deficits are appreciated.  Skin:  Skin is warm, dry and intact. No rash noted. Psychiatric: Mood and affect are normal. Speech and behavior are normal. Patient exhibits appropriate insight and judgment.  ____________________________________________    LABS (pertinent positives/negatives)  Labs performed through PCP reviewed. Hgb 6.2 UA positive nitrite, >50 wbc ____________________________________________   EKG  None  ____________________________________________    RADIOLOGY  None  ____________________________________________   PROCEDURES  Procedures  ____________________________________________   INITIAL IMPRESSION / ASSESSMENT AND PLAN / ED COURSE  Pertinent labs & imaging results that were available during my care of the patient were reviewed by me and considered in my medical decision making (see chart for details).   Patient presented to the emergency department at request of primary care because of concerns for anemia found on  blood work performed today.  On review patient's hemoglobin was 6.2.  Patient does report GI bleed.  Will start patient on Protonix.  Consented and discussed blood transfusion with patient.  Will admit to the hospital service.  ____________________________________________   FINAL CLINICAL IMPRESSION(S) / ED DIAGNOSES  Final diagnoses:  Anemia, unspecified type  Gastrointestinal hemorrhage, unspecified gastrointestinal hemorrhage type     Note: This dictation was prepared with Dragon dictation. Any transcriptional errors that result from this process are unintentional     09/15/19, MD 07/16/19 2024

## 2019-07-16 NOTE — ED Triage Notes (Signed)
Pt sent by PCP for blood transfusion due to HGB of 6.2 Pt reports black tarry stools.

## 2019-07-16 NOTE — ED Notes (Signed)
Pt up to bedside toilet. Will hang antibiotic once pt done urinating.

## 2019-07-16 NOTE — ED Notes (Signed)
Pt given warm blankets. Significant other at bedside. Rail up. Bed locked low. Call bell within reach.

## 2019-07-16 NOTE — Progress Notes (Signed)
Advance care planning  Purpose of Encounter Upper gi bleed, melena, anemia  Parties in Attendance Patient  Patients Decisional capacity Alert and oriented.  Able to make medical decisions.  Husband at bedside is the medical power of attorney  Discussed in detail regarding GI bleed, anemia.  Treatment plan , prognosis discussed.  All questions answered  Code status discussed.  Patient tells me she has a living will.  At this point she is requesting aggressive medical care with CPR/defibrillation/intubation if needed.  Orders entered and CODE STATUS changed  Full code  Time spent - 17 minutes

## 2019-07-16 NOTE — ED Notes (Signed)
2nd pink tube sent to lab as requested. 

## 2019-07-16 NOTE — ED Notes (Signed)
Pt request to speak with MD before she had basics drawn since she had it collected at [facility]

## 2019-07-16 NOTE — ED Notes (Signed)
Patient states she has been having dark color stools, no vomiting but has been nauseated.

## 2019-07-16 NOTE — H&P (Addendum)
[facility] NAME: Cheryl Goodman    MR#:  [id number]  DATE OF BIRTH:  [birth year]  DATE OF ADMISSION:  07/16/2019  PRIMARY CARE PHYSICIAN: Cheryl Aus, MD   REQUESTING/REFERRING PHYSICIAN: Dr. Archie Balboa  CHIEF COMPLAINT:   Chief Complaint  Patient presents with  . Abnormal Lab    HISTORY OF PRESENT ILLNESS:  Cheryl Goodman  is a 78 y.o. female with a known history of gastric ulcer/erosive gastritis, and, hiatal hernia, arthritis presents to the emergency room sent in by her primary care physician after she had dizziness and weakness for 2 weeks and found to have anemia with hemoglobin 6.2.  She has noticed dark tarry stools.  Stool positive for blood.  Patient has arthritis and takes ibuprofen every day.  Had cough with PPIs and stopped.  No bright red blood per rectum.  No shortness of breath or chest pain.  Not on any anticoagulants.  She is scheduled for EGD and colonoscopy in December.  She had erosive gastritis on EGD in the past.  PAST MEDICAL HISTORY:   Past Medical History:  Diagnosis Date  . Arthritis   . Dysphagia   . GERD (gastroesophageal reflux disease)   . History of hiatal hernia   . History of kidney stones    x2 ; passed independently  . Hyperlipidemia   . Hypertension   . Nausea     PAST SURGICAL HISTORY:   Past Surgical History:  Procedure Laterality Date  . ABDOMINAL HYSTERECTOMY    . APPENDECTOMY    . BREAST BIOPSY Right   . BREAST SURGERY    . CARPAL TUNNEL RELEASE    . CHOLECYSTECTOMY    . COLONOSCOPY WITH PROPOFOL N/A 09/01/2015   Procedure: COLONOSCOPY WITH PROPOFOL;  Surgeon: Cheryl Silvas, MD;  Location: [facility] ENDOSCOPY;  Service: Endoscopy;  Laterality: N/A;  . REDUCTION MAMMAPLASTY Bilateral   . SAVORY DILATION N/A 09/01/2015   Procedure: SAVORY DILATION;  Surgeon: Cheryl Silvas, MD;  Location: [facility] ENDOSCOPY;  Service: Endoscopy;  Laterality: N/A;  . TOTAL KNEE ARTHROPLASTY  Right 10/31/2017   Procedure: RIGHT TOTAL KNEE ARTHROPLASTY;  Surgeon: Cheryl Arabian, MD;  Location: [facility] ORS;  Service: Orthopedics;  Laterality: Right;    SOCIAL HISTORY:   Social History   Tobacco Use  . Smoking status: Never Smoker  . Smokeless tobacco: Never Used  Substance Use Topics  . Alcohol use: No    FAMILY HISTORY:   Family History  Problem Relation Age of Onset  . Breast cancer Maternal Grandmother        in 103's    DRUG ALLERGIES:   Allergies  Allergen Reactions  . Penicillins Rash and Other (See Comments)    Has patient had a PCN reaction causing immediate rash, facial/tongue/throat swelling, SOB or lightheadedness with hypotension: No Has patient had a PCN reaction causing severe rash involving mucus membranes or skin necrosis: No Has patient had a PCN reaction that required hospitalization: No Has patient had a PCN reaction occurring within the last 10 years: No If all of the above answers are "NO", then may proceed with Cephalosporin use.   . Ace Inhibitors Cough  . Dexilant [Dexlansoprazole] Other (See Comments)    syncope  . Sulfa Antibiotics Swelling    REVIEW OF SYSTEMS:   Review of Systems  Constitutional: Positive for malaise/fatigue. Negative for chills and fever.  HENT: Negative for sore throat.   Eyes: Negative for blurred vision, double  vision and pain.  Respiratory: Negative for cough, hemoptysis, shortness of breath and wheezing.   Cardiovascular: Negative for chest pain, palpitations, orthopnea and leg swelling.  Gastrointestinal: Positive for melena. Negative for abdominal pain, constipation, diarrhea, heartburn, nausea and vomiting.  Genitourinary: Negative for dysuria and hematuria.  Musculoskeletal: Negative for back pain and joint pain.  Skin: Negative for rash.  Neurological: Positive for dizziness. Negative for sensory change, speech change, focal weakness and headaches.  Endo/Heme/Allergies: Does not bruise/bleed easily.   Psychiatric/Behavioral: Negative for depression. The patient is not nervous/anxious.     MEDICATIONS AT HOME:   Prior to Admission medications   Medication Sig Start Date End Date Taking? Authorizing Provider  levocetirizine (XYZAL) 5 MG tablet Take 5 mg by mouth as needed.  09/08/17   [provider]  montelukast (SINGULAIR) 10 MG tablet Take 10 mg by mouth at bedtime. 07/08/17   [provider]  pantoprazole (PROTONIX) 40 MG tablet Take 40 mg by mouth at bedtime.     [provider]  sucralfate (CARAFATE) 1 g tablet Take 1 g by mouth 2 (two) times daily.    [provider]  traMADol (ULTRAM) 50 MG tablet Take 1-2 tablets (50-100 mg total) by mouth every 6 (six) hours as needed (mild pain). 11/01/17   Perkins, Alexzandrew L, PA-C     VITAL SIGNS:  Blood pressure (!) 182/81, pulse 88, temperature 98.4 F (36.9 C), temperature source Oral, resp. rate 16, height 4\' 11"  (1.499 m), weight 81.2 kg, SpO2 99 %.  PHYSICAL EXAMINATION:  Physical Exam  GENERAL:  78 y.o.-year-old patient lying in the bed with no acute distress.  EYES: Pupils equal, round, reactive to light and accommodation. No scleral icterus. Extraocular muscles intact.  HEENT: Head atraumatic, normocephalic. Oropharynx and nasopharynx clear. No oropharyngeal erythema, moist oral mucosa  NECK:  Supple, no jugular venous distention. No thyroid enlargement, no tenderness.  LUNGS: Normal breath sounds bilaterally, no wheezing, rales, rhonchi. No use of accessory muscles of respiration.  CARDIOVASCULAR: S1, S2 normal. No murmurs, rubs, or gallops.  ABDOMEN: Soft, nontender, nondistended. Bowel sounds present. No organomegaly or mass.  EXTREMITIES: No pedal edema, cyanosis, or clubbing. + 2 pedal & radial pulses b/l.   NEUROLOGIC: Cranial nerves II through XII are intact. No focal Motor or sensory deficits appreciated b/l PSYCHIATRIC: The patient is alert and oriented x 3. Good affect.  SKIN: No  obvious rash, lesion, or ulcer.   LABORATORY PANEL:   CBC No results for input(s): WBC, HGB, HCT, PLT in the last 168 hours. ------------------------------------------------------------------------------------------------------------------  Chemistries  No results for input(s): NA, K, CL, CO2, GLUCOSE, BUN, CREATININE, CALCIUM, MG, AST, ALT, ALKPHOS, BILITOT in the last 168 hours.  Invalid input(s): GFRCGP ------------------------------------------------------------------------------------------------------------------  Cardiac Enzymes No results for input(s): TROPONINI in the last 168 hours. ------------------------------------------------------------------------------------------------------------------  RADIOLOGY:  No results found.   IMPRESSION AND PLAN:   *Upper GI bleed with melena and symptomatic acute blood loss anemia Likely gastritis or gastric ulcer secondary to NSAIDs We will transfuse 1 unit packed RBC stat.  Start Protonix IV. Consult GI.  Sent message to Dr. 70.  Clear liquid diet at this time.  N.p.o. after midnight.  *Hypertension.  Continue home medications.  *Arthritis.  Continue tramadol from home.  *Asymptomatic bacteriuria.  Patient received 1 dose of ceftriaxone.  No dysuria or frequency or hematuria.  Will not continue antibiotics.  All the records are reviewed and case discussed with ED provider. Management plans discussed with the patient, family  and they are in agreement.  CODE STATUS: FULL CODE  TOTAL TIME TAKING CARE OF THIS PATIENT: 40 minutes.   Molinda BailiffSrikar R Madeleyn Schwimmer M.D on 07/16/2019 at 8:06 PM  Between 7am to 6pm - Pager - [phone]  After 6pm go to [email or web address] - password [email or web address]  [facility] [place] [facility]  Office  [phone]  CC: Primary care physician; Danella PentonMiller, Mark F, MD  Note: This dictation was prepared with Dragon dictation along with smaller phrase technology. Any transcriptional errors that result from this  process are unintentional.

## 2019-07-17 ENCOUNTER — Encounter: Payer: Self-pay | Admitting: *Deleted

## 2019-07-17 ENCOUNTER — Inpatient Hospital Stay: Payer: Medicare HMO | Admitting: Anesthesiology

## 2019-07-17 ENCOUNTER — Encounter
Admission: EM | Disposition: A | Discharge status: Home / Self Care | Payer: Self-pay | Source: Home / Self Care | Attending: Internal Medicine

## 2019-07-17 DIAGNOSIS — K449 Diaphragmatic hernia without obstruction or gangrene: Secondary | ICD-10-CM

## 2019-07-17 DIAGNOSIS — K921 Melena: Secondary | ICD-10-CM

## 2019-07-17 DIAGNOSIS — K3189 Other diseases of stomach and duodenum: Secondary | ICD-10-CM

## 2019-07-17 DIAGNOSIS — K2100 Gastro-esophageal reflux disease with esophagitis, without bleeding: Secondary | ICD-10-CM

## 2019-07-17 DIAGNOSIS — K219 Gastro-esophageal reflux disease without esophagitis: Secondary | ICD-10-CM

## 2019-07-17 DIAGNOSIS — D62 Acute posthemorrhagic anemia: Secondary | ICD-10-CM

## 2019-07-17 HISTORY — PX: ESOPHAGOGASTRODUODENOSCOPY: SHX5428

## 2019-07-17 LAB — IRON AND TIBC
Iron: 12 ug/dL — ABNORMAL LOW (ref 28–170)
Saturation Ratios: 3 % — ABNORMAL LOW (ref 10.4–31.8)
TIBC: 415 ug/dL (ref 250–450)
UIBC: 403 ug/dL

## 2019-07-17 LAB — HEMOGLOBIN AND HEMATOCRIT, BLOOD
HCT: 27.4 % — ABNORMAL LOW (ref 36.0–46.0)
Hemoglobin: 8.2 g/dL — ABNORMAL LOW (ref 12.0–15.0)

## 2019-07-17 LAB — BASIC METABOLIC PANEL
Anion gap: 7 (ref 5–15)
BUN: 15 mg/dL (ref 8–23)
CO2: 25 mmol/L (ref 22–32)
Calcium: 8 mg/dL — ABNORMAL LOW (ref 8.9–10.3)
Chloride: 110 mmol/L (ref 98–111)
Creatinine, Ser: 0.79 mg/dL (ref 0.44–1.00)
GFR calc Af Amer: >60 mL/min (ref >60)
GFR calc non Af Amer: >60 mL/min (ref >60)
Glucose, Bld: 103 mg/dL — ABNORMAL HIGH (ref 70–99)
Potassium: 4.1 mmol/L (ref 3.5–5.1)
Sodium: 142 mmol/L (ref 135–145)

## 2019-07-17 LAB — CBC
HCT: 21.9 % — ABNORMAL LOW (ref 36.0–46.0)
Hemoglobin: 6.4 g/dL — ABNORMAL LOW (ref 12.0–15.0)
MCH: 23.2 pg — ABNORMAL LOW (ref 26.0–34.0)
MCHC: 29.2 g/dL — ABNORMAL LOW (ref 30.0–36.0)
MCV: 79.3 fL — ABNORMAL LOW (ref 80.0–100.0)
Platelets: 265 10*3/uL (ref 150–400)
RBC: 2.76 MIL/uL — ABNORMAL LOW (ref 3.87–5.11)
RDW: 17 % — ABNORMAL HIGH (ref 11.5–15.5)
WBC: 7.8 10*3/uL (ref 4.0–10.5)
nRBC: 0 % (ref 0.0–0.2)

## 2019-07-17 LAB — ABO/RH: ABO/RH(D): B NEG

## 2019-07-17 LAB — PREPARE RBC (CROSSMATCH)

## 2019-07-17 LAB — FERRITIN: Ferritin: 3 ng/mL — ABNORMAL LOW (ref 11–307)

## 2019-07-17 LAB — SARS CORONAVIRUS 2 (TAT 6-24 HRS): SARS Coronavirus 2: NEGATIVE

## 2019-07-17 SURGERY — EGD (ESOPHAGOGASTRODUODENOSCOPY)
Anesthesia: General

## 2019-07-17 MED ORDER — LIDOCAINE HCL (CARDIAC) PF 100 MG/5ML IV SOSY
PREFILLED_SYRINGE | INTRAVENOUS | Status: DC | PRN
  Administered 2019-07-17: 100 mg via INTRATRACHEAL

## 2019-07-17 MED ORDER — SODIUM CHLORIDE 0.9 % IV SOLN
INTRAVENOUS | Status: DC
  Administered 2019-07-17: 1000 mL via INTRAVENOUS

## 2019-07-17 MED ORDER — SODIUM CHLORIDE 0.9% IV SOLUTION
Freq: Once | INTRAVENOUS | Status: AC
  Administered 2019-07-17: 10:00:00 via INTRAVENOUS

## 2019-07-17 MED ORDER — PROPOFOL 10 MG/ML IV BOLUS
INTRAVENOUS | Status: DC | PRN
  Administered 2019-07-17: 20 mg via INTRAVENOUS
  Administered 2019-07-17: 40 mg via INTRAVENOUS

## 2019-07-17 MED ORDER — SODIUM CHLORIDE 0.9 % IV SOLN
510.0000 mg | Freq: Once | INTRAVENOUS | Status: AC
  Administered 2019-07-17: 510 mg via INTRAVENOUS
  Filled 2019-07-17: qty 17

## 2019-07-17 MED ORDER — GLYCOPYRROLATE 0.2 MG/ML IJ SOLN
INTRAMUSCULAR | Status: DC | PRN
  Administered 2019-07-17 (×2): 0.2 mg via INTRAVENOUS

## 2019-07-17 MED ORDER — SODIUM CHLORIDE 0.9 % IV SOLN
INTRAVENOUS | Status: DC | PRN
  Administered 2019-07-17: 14:00:00 via INTRAVENOUS

## 2019-07-17 MED ORDER — TRAMADOL HCL 50 MG PO TABS
50.0000 mg | ORAL_TABLET | Freq: Four times a day (QID) | ORAL | Status: DC | PRN
  Administered 2019-07-17: 50 mg via ORAL
  Filled 2019-07-17: qty 1

## 2019-07-17 MED ORDER — GUAIFENESIN-DM 100-10 MG/5ML PO SYRP
5.0000 mL | ORAL_SOLUTION | ORAL | Status: DC | PRN
  Administered 2019-07-17: 5 mL via ORAL
  Filled 2019-07-17: qty 5

## 2019-07-17 MED ORDER — PROPOFOL 500 MG/50ML IV EMUL
INTRAVENOUS | Status: DC | PRN
  Administered 2019-07-17: 140 ug/kg/min via INTRAVENOUS

## 2019-07-17 NOTE — Progress Notes (Signed)
[facility] [place] NAME: Nakyia Dau    MR#:  [id number]  DATE OF BIRTH:  19-Mar-1941  SUBJECTIVE:  CHIEF COMPLAINT:   Chief Complaint  Patient presents with  . Abnormal Lab   No bowel movement since coming to the hospital.  Complains of some headache.  N.p.o. for EGD.  REVIEW OF SYSTEMS:    Review of Systems  Constitutional: Positive for malaise/fatigue. Negative for chills and fever.  HENT: Negative for sore throat.   Eyes: Negative for blurred vision, double vision and pain.  Respiratory: Negative for cough, hemoptysis, shortness of breath and wheezing.   Cardiovascular: Negative for chest pain, palpitations, orthopnea and leg swelling.  Gastrointestinal: Negative for abdominal pain, constipation, diarrhea, heartburn, nausea and vomiting.  Genitourinary: Negative for dysuria and hematuria.  Musculoskeletal: Negative for back pain and joint pain.  Skin: Negative for rash.  Neurological: Positive for dizziness and headaches. Negative for sensory change, speech change and focal weakness.  Endo/Heme/Allergies: Does not bruise/bleed easily.  Psychiatric/Behavioral: Negative for depression. The patient is not nervous/anxious.     DRUG ALLERGIES:   Allergies  Allergen Reactions  . Penicillins Rash and Other (See Comments)    Has patient had a PCN reaction causing immediate rash, facial/tongue/throat swelling, SOB or lightheadedness with hypotension: No Has patient had a PCN reaction causing severe rash involving mucus membranes or skin necrosis: No Has patient had a PCN reaction that required hospitalization: No Has patient had a PCN reaction occurring within the last 10 years: No If all of the above answers are "NO", then may proceed with Cephalosporin use.   . Ace Inhibitors Cough  . Dexilant [Dexlansoprazole] Other (See Comments)    syncope  . Oxycodone Nausea Only  . Sulfa Antibiotics Swelling    VITALS:  Blood pressure  (!) 160/73, pulse 78, temperature 98.2 F (36.8 C), temperature source Oral, resp. rate 16, height 4\' 11"  (1.499 m), weight 84.1 kg, SpO2 93 %.  PHYSICAL EXAMINATION:   Physical Exam  GENERAL:  78 y.o.-year-old patient lying in the bed with no acute distress.  EYES: Pupils equal, round, reactive to light and accommodation. No scleral icterus. Extraocular muscles intact.  HEENT: Head atraumatic, normocephalic. Oropharynx and nasopharynx clear.  NECK:  Supple, no jugular venous distention. No thyroid enlargement, no tenderness.  LUNGS: Normal breath sounds bilaterally, no wheezing, rales, rhonchi. No use of accessory muscles of respiration.  CARDIOVASCULAR: S1, S2 normal. No murmurs, rubs, or gallops.  ABDOMEN: Soft, nontender, nondistended. Bowel sounds present. No organomegaly or mass.  EXTREMITIES: No cyanosis, clubbing or edema b/l.    NEUROLOGIC: Cranial nerves II through XII are intact. No focal Motor or sensory deficits b/l.   PSYCHIATRIC: The patient is alert and oriented x 3.  SKIN: No obvious rash, lesion, or ulcer.   LABORATORY PANEL:   CBC Recent Labs  Lab 07/17/19 0454  WBC 7.8  HGB 6.4*  HCT 21.9*  PLT 265   ------------------------------------------------------------------------------------------------------------------ Chemistries  Recent Labs  Lab 07/17/19 0454  NA 142  K 4.1  CL 110  CO2 25  GLUCOSE 103*  BUN 15  CREATININE 0.79  CALCIUM 8.0*   ------------------------------------------------------------------------------------------------------------------  Cardiac Enzymes No results for input(s): TROPONINI in the last 168 hours. ------------------------------------------------------------------------------------------------------------------  RADIOLOGY:  No results found.   ASSESSMENT AND PLAN:   *Upper GI bleed with melena and symptomatic acute blood loss anemia Transfuse 1 unit packed RBC but hemoglobin has dropped.  Will transfuse 1 more  unit packed RBC stat.  Will need EGD. Waiting for GI to schedule procedure. Likely gastritis or gastric ulcer secondary to NSAIDs We will transfuse 1 unit packed RBC stat.  Start Protonix IV. Sent message to Dr. Maximino Greenland.  N.p.o.  *Hypertension.  Continue home medications.  *Arthritis.  Continue tramadol from home.  *Asymptomatic bacteriuria.  Patient received 1 dose of ceftriaxone in the emergency room.  No dysuria or frequency or hematuria.  Will not continue antibiotics.  All the records are reviewed and case discussed with Care Management/Social Worker Management plans discussed with the patient, family and they are in agreement.  CODE STATUS: Full code  DVT Prophylaxis: SCDs  TOTAL CRITICAL CARE TIME TAKING CARE OF THIS PATIENT: 35 minutes.   POSSIBLE D/C IN 1-2 DAYS, DEPENDING ON CLINICAL CONDITION.  Molinda Bailiff Donaldo Teegarden M.D on 07/17/2019 at 10:49 AM  Between 7am to 6pm - Pager - [phone]  After 6pm go to [email or web address] - password [email or web address]  [facility] [place] [facility]  Office  [phone]  CC: Primary care physician; Danella Penton, MD  Note: This dictation was prepared with Dragon dictation along with smaller phrase technology. Any transcriptional errors that result from this process are unintentional.

## 2019-07-17 NOTE — Anesthesia Preprocedure Evaluation (Signed)
Anesthesia Evaluation  Patient identified by MRN, date of birth, ID band Patient awake    Reviewed: Allergy & Precautions, NPO status , Patient's Chart, lab work & pertinent test results  History of Anesthesia Complications Negative for: history of anesthetic complications  Airway Mallampati: I  TM Distance: >3 FB Neck ROM: Full    Dental  (+) Caps   Pulmonary sleep apnea and Continuous Positive Airway Pressure Ventilation , neg COPD,    breath sounds clear to auscultation- rhonchi (-) wheezing      Cardiovascular hypertension, Pt. on medications (-) CAD, (-) Past MI, (-) Cardiac Stents and (-) CABG  Rhythm:Regular Rate:Normal - Systolic murmurs and - Diastolic murmurs    Neuro/Psych neg Seizures negative neurological ROS  negative psych ROS   GI/Hepatic Neg liver ROS, hiatal hernia, GERD  ,GIB   Endo/Other  negative endocrine ROSneg diabetes  Renal/GU negative Renal ROS     Musculoskeletal  (+) Arthritis ,   Abdominal (+) + obese,   Peds  Hematology negative hematology ROS (+)   Anesthesia Other Findings Past Medical History: No date: Arthritis No date: Dysphagia No date: GERD (gastroesophageal reflux disease) No date: History of hiatal hernia No date: History of kidney stones     Comment:  x2 ; passed independently No date: Hyperlipidemia No date: Hypertension No date: Nausea   Reproductive/Obstetrics                             Anesthesia Physical Anesthesia Plan  ASA: II  Anesthesia Plan: General   Post-op Pain Management:    Induction: Intravenous  PONV Risk Score and Plan: 2 and Propofol infusion  Airway Management Planned: Natural Airway  Additional Equipment:   Intra-op Plan:   Post-operative Plan:   Informed Consent: I have reviewed the patients History and Physical, chart, labs and discussed the procedure including the risks, benefits and alternatives for  the proposed anesthesia with the patient or authorized representative who has indicated his/her understanding and acceptance.     Dental advisory given  Plan Discussed with: CRNA and Anesthesiologist  Anesthesia Plan Comments:         Anesthesia Quick Evaluation

## 2019-07-17 NOTE — Consult Note (Signed)
Vonda Antigua, MD [address] [place], [place], [place], [id number] [place], [place], [place], [address] Phone: [phone]  Fax: [phone]  Consultation  Referring Provider:     Dr. Darvin Neighbours Primary Care Physician:  [name], MD Reason for Consultation:     Melena, anemia  Date of Admission:  07/16/2019 Date of Consultation:  07/17/2019         HPI:   Cheryl Goodman is a 78 y.o. female who presents with black stools at home for 1 to 2 days, associated with anemia.  No abdominal pain.  No nausea or vomiting.  Reports taking ibuprofen daily for over a year.  Xarelto is listed as a previous medication and patient states she has not taken it in 2 years.  Sees [facility] and was last seen in July 2020 and they had recommended an EGD and colonoscopy but patient canceled these procedures due to cataract surgery.    Patient had an EGD in 2016, available in probation, for dysphagia.  Done by Dr. Vira Agar.  Mild Schatzki's ring was reported and dilated to 16 mm with savory dilator.  Gastritis was reported.  Hiatal hernia was reported.  Had a colonoscopy in 2015 that reported diverticulosis.  Please see provation for details of other previous procedures.  Past Medical History:  Diagnosis Date  . Arthritis   . Dysphagia   . GERD (gastroesophageal reflux disease)   . History of hiatal hernia   . History of kidney stones    x2 ; passed independently  . Hyperlipidemia   . Hypertension   . Nausea     Past Surgical History:  Procedure Laterality Date  . ABDOMINAL HYSTERECTOMY    . APPENDECTOMY    . BREAST BIOPSY Right   . BREAST SURGERY    . CARPAL TUNNEL RELEASE    . CHOLECYSTECTOMY    . COLONOSCOPY WITH PROPOFOL N/A 09/01/2015   Procedure: COLONOSCOPY WITH PROPOFOL;  Surgeon: Manya Silvas, MD;  Location: [facility] ENDOSCOPY;  Service: Endoscopy;  Laterality: N/A;  . REDUCTION MAMMAPLASTY Bilateral   . SAVORY DILATION N/A 09/01/2015   Procedure: SAVORY DILATION;  Surgeon: Manya Silvas, MD;  Location: [facility] ENDOSCOPY;  Service: Endoscopy;  Laterality: N/A;  . TOTAL KNEE ARTHROPLASTY Right 10/31/2017   Procedure: RIGHT TOTAL KNEE ARTHROPLASTY;  Surgeon: Gaynelle Arabian, MD;  Location: [facility] ORS;  Service: Orthopedics;  Laterality: Right;    Prior to Admission medications   Medication Sig Start Date End Date Taking? Authorizing Provider  Biotin 1000 MCG tablet Take 1,000 mcg by mouth daily.   Yes [provider]  Cholecalciferol (VITAMIN D3) 125 MCG (5000 UT) TABS Take 5,000 Units by mouth daily.   Yes [provider]  traMADol (ULTRAM) 50 MG tablet Take 50 mg by mouth every 6 (six) hours as needed for moderate pain.   Yes [provider]  vitamin B-12 (CYANOCOBALAMIN) 1000 MCG tablet Take 1,000 mcg by mouth daily.   Yes [provider]  levocetirizine (XYZAL) 5 MG tablet Take 5 mg by mouth as needed.  09/08/17   [provider]  montelukast (SINGULAIR) 10 MG tablet Take 10 mg by mouth at bedtime. 07/08/17   [provider]  pantoprazole (PROTONIX) 40 MG tablet Take 40 mg by mouth at bedtime.     [provider]  rivaroxaban (XARELTO) 10 MG TABS tablet Take 10 mg by mouth daily. 11/02/17   [provider]  sucralfate (CARAFATE) 1  g tablet Take 1 g by mouth 2 (two) times daily.    [provider]    Family History  Problem Relation Age of Onset  . Breast cancer Maternal Grandmother        in 31's     Social History   Tobacco Use  . Smoking status: Never Smoker  . Smokeless tobacco: Never Used  Substance Use Topics  . Alcohol use: No  . Drug use: No    Allergies as of 07/16/2019 - Review Complete 07/16/2019  Allergen Reaction Noted  . Penicillins Rash and Other (See Comments) 08/14/2015  . Ace inhibitors Cough 11/05/2015  . Dexilant [dexlansoprazole] Other (See Comments) 08/14/2015  . Oxycodone Nausea Only 12/19/2017  . Sulfa antibiotics  Swelling 08/14/2015    Review of Systems:    All systems reviewed and negative except where noted in HPI.   Physical Exam:  Vital signs in last 24 hours: Vitals:   07/17/19 1008 07/17/19 1037 07/17/19 1304 07/17/19 1358  BP: (!) 144/98 (!) 160/73 (!) 149/69 (!) 160/78  Pulse: 80 78 75 78  Resp: 20 16 14 20   Temp: 98.6 F (37 C) 98.2 F (36.8 C) 98.5 F (36.9 C) (!) 96.4 F (35.8 C)  TempSrc: Oral Oral Oral Tympanic  SpO2: 91% 93% 91% 96%  Weight:      Height:       Last BM Date: 07/15/19 General:   Pleasant, cooperative in NAD Head:  Normocephalic and atraumatic. Eyes:   No icterus.   Conjunctiva pink. PERRLA. Ears:  Normal auditory acuity. Neck:  Supple; no masses or thyroidomegaly Lungs: Respirations even and unlabored. Lungs clear to auscultation bilaterally.   No wheezes, crackles, or rhonchi.  Abdomen:  Soft, nondistended, nontender. Normal bowel sounds. No appreciable masses or hepatomegaly.  No rebound or guarding.  Neurologic:  Alert and oriented x3;  grossly normal neurologically. Skin:  Intact without significant lesions or rashes. Cervical Nodes:  No significant cervical adenopathy. Psych:  Alert and cooperative. Normal affect.  LAB RESULTS: Recent Labs    07/17/19 0454  WBC 7.8  HGB 6.4*  HCT 21.9*  PLT 265   BMET Recent Labs    07/17/19 0454  NA 142  K 4.1  CL 110  CO2 25  GLUCOSE 103*  BUN 15  CREATININE 0.79  CALCIUM 8.0*   LFT No results for input(s): PROT, ALBUMIN, AST, ALT, ALKPHOS, BILITOT, BILIDIR, IBILI in the last 72 hours. PT/INR No results for input(s): LABPROT, INR in the last 72 hours.  STUDIES: No results found.    Impression / Plan:   Cheryl Goodman is a 78 y.o. y/o female with melena and anemia, in the setting of NSAID use  Can proceed with upper endoscopy today since patient has not taken Xarelto in 2 years  PPI IV twice daily  Continue serial CBCs and transfuse PRN Avoid NSAIDs Maintain 2 large-bore IV  lines Please page GI with any acute hemodynamic changes, or signs of active GI bleeding  Please see procedure report for findings and details after the procedure  Thank you for involving me in the care of this patient.      LOS: 1 day   70, MD  07/17/2019, 2:12 PM

## 2019-07-17 NOTE — Anesthesia Postprocedure Evaluation (Signed)
Anesthesia Post Note  Patient: Cheryl Goodman  Procedure(s) Performed: ESOPHAGOGASTRODUODENOSCOPY (EGD) (N/A )  Patient location during evaluation: Endoscopy Anesthesia Type: General Level of consciousness: awake and alert Pain management: pain level controlled Vital Signs Assessment: post-procedure vital signs reviewed and stable Respiratory status: spontaneous breathing, nonlabored ventilation, respiratory function stable and patient connected to nasal cannula oxygen Cardiovascular status: blood pressure returned to baseline and stable Postop Assessment: no apparent nausea or vomiting Anesthetic complications: no     Last Vitals:  Vitals:   07/17/19 1455 07/17/19 1511  BP: 140/87 (!) 147/73  Pulse: 91 91  Resp: 16 16  Temp:  37.1 C  SpO2: 94% (!) 89%    Last Pain:  Vitals:   07/17/19 1511  TempSrc: Oral  PainSc:                  Precious Haws Piscitello

## 2019-07-17 NOTE — Op Note (Signed)
[facility] Gastroenterology Patient Name: Cheryl CarwinBeatrice Grillo Procedure Date: 07/17/2019 2:11 PM MRN: [id number] Account #: [id number] Date of Birth: 1940/12/23 Admit Type: Inpatient Age: 78 [facility] Gender: Female Note Status: Finalized Procedure:            Upper GI endoscopy Indications:          Melena Providers:            Herminia Warren B. Maximino Greenlandahiliani MD, MD Referring MD:         Danella PentonMark F. Miller, MD (Referring MD) Medicines:            Monitored Anesthesia Care Complications:        No immediate complications. Procedure:            Pre-Anesthesia Assessment:                       - The risks and benefits of the procedure and the                        sedation options and risks were discussed with the                        patient. All questions were answered and informed                        consent was obtained.                       - Patient identification and proposed procedure were                        verified prior to the procedure.                       - ASA Grade Assessment: II - A patient with mild                        systemic disease.                       After obtaining informed consent, the endoscope was                        passed under direct vision. Throughout the procedure,                        the patient's blood pressure, pulse, and oxygen                        saturations were monitored continuously. The Endoscope                        was introduced through the mouth, and advanced to the                        second part of duodenum. The upper GI endoscopy was                        accomplished with ease. The patient tolerated the  procedure well. Findings:      LA Grade A (one or more mucosal breaks less than 5 mm, not extending       between tops of 2 mucosal folds) esophagitis with no bleeding was found       at the gastroesophageal junction.      Multiple, 3 to 7 mm non-bleeding erosions  were found in the gastric       body. There were no stigmata of recent bleeding.      A large hiatal hernia was present.      The examined duodenum was normal. Impression:           - LA Grade A reflux esophagitis.                       - Non-bleeding erosive gastropathy.                       - Large hiatal hernia.                       - Normal examined duodenum.                       - No specimens collected.                       - Patient's erosions are likely from NSAID use and are                        the likely cause of her melena and GI bleed. Recommendation:       - Perform an H. pylori serology today.                       - Avoid NSAIDs except Aspirin if medically indicated by                        PCP                       - Continue Serial CBCs and transfuse PRN                       - Use Protonix (pantoprazole) 40 mg IV BID.                       - Advance diet as tolerated.                       - The findings and recommendations were discussed with                        the patient.                       - Refer to a surgeon for hiatal hernia in 4-6 weeks.                       - Return to GI clinic in 4 weeks. Procedure Code(s):    --- Professional ---                       [phone], Esophagogastroduodenoscopy, flexible, transoral;  diagnostic, including collection of specimen(s) by                        brushing or washing, when performed (separate procedure) Diagnosis Code(s):    --- Professional ---                       K21.0, Gastro-esophageal reflux disease with esophagitis                       K31.89, Other diseases of stomach and duodenum                       K44.9, Diaphragmatic hernia without obstruction or                        gangrene                       K92.1, Melena (includes Hematochezia) CPT copyright 2019 American Medical Association. All rights reserved. The codes documented in this report are preliminary and upon  coder review may  be revised to meet current compliance requirements.  Melodie Bouillon, MD Michel Bickers B. Maximino Greenland MD, MD 07/17/2019 2:47:20 PM This report has been signed electronically. Number of Addenda: 0 Note Initiated On: 07/17/2019 2:11 PM Estimated Blood Loss: Estimated blood loss: none.      [facility]

## 2019-07-17 NOTE — Transfer of Care (Signed)
Immediate Anesthesia Transfer of Care Note  Patient: Cheryl Goodman  Procedure(s) Performed: ESOPHAGOGASTRODUODENOSCOPY (EGD) (N/A )  Patient Location: Endoscopy Unit  Anesthesia Type:General  Level of Consciousness: awake, patient cooperative and responds to stimulation  Airway & Oxygen Therapy: Patient Spontanous Breathing and Patient connected to nasal cannula oxygen  Post-op Assessment: Report given to RN and Post -op Vital signs reviewed and stable  Post vital signs: Reviewed and stable  Last Vitals:  Vitals Value Taken Time  BP    Temp    Pulse    Resp    SpO2      Last Pain:  Vitals:   07/17/19 1358  TempSrc: Tympanic  PainSc: 0-No pain      Patients Stated Pain Goal: 0 (03/00/92 3300)  Complications: No apparent anesthesia complications

## 2019-07-17 NOTE — Anesthesia Post-op Follow-up Note (Signed)
Anesthesia QCDR form completed.        

## 2019-07-18 ENCOUNTER — Encounter: Payer: Self-pay | Admitting: Gastroenterology

## 2019-07-18 LAB — TYPE AND SCREEN
ABO/RH(D): B NEG
Antibody Screen: NEGATIVE
Unit division: 0
Unit division: 0

## 2019-07-18 LAB — BPAM RBC
Blood Product Expiration Date: 202011042359
Blood Product Expiration Date: 202011072359
ISSUE DATE / TIME: 202010122351
ISSUE DATE / TIME: 202010131011
Unit Type and Rh: 1700
Unit Type and Rh: 1700

## 2019-07-18 LAB — HEMOGLOBIN: Hemoglobin: 8.3 g/dL — ABNORMAL LOW (ref 12.0–15.0)

## 2019-07-18 LAB — H PYLORI, IGM, IGG, IGA AB
H Pylori IgG: 0.6 Index Value (ref 0.00–0.79)
H. Pylogi, Iga Abs: <9 units (ref 0.0–8.9)
H. Pylogi, Igm Abs: 15.3 units — ABNORMAL HIGH (ref 0.0–8.9)

## 2019-07-18 MED ORDER — FERROUS SULFATE 325 (65 FE) MG PO TBEC: 325.0000 mg | DELAYED_RELEASE_TABLET | Freq: Two times a day (BID) | ORAL | 0 refills | Status: DC

## 2019-07-18 MED ORDER — PANTOPRAZOLE SODIUM 40 MG PO TBEC: 40.0000 mg | DELAYED_RELEASE_TABLET | Freq: Two times a day (BID) | ORAL | 0 refills | Status: DC

## 2019-07-18 MED ORDER — FUROSEMIDE 40 MG PO TABS
40.0000 mg | ORAL_TABLET | Freq: Once | ORAL | Status: AC
  Administered 2019-07-18: 40 mg via ORAL
  Filled 2019-07-18: qty 1

## 2019-07-18 NOTE — Discharge Instructions (Signed)
Resume diet and activity as before ° ° °

## 2019-07-18 NOTE — Progress Notes (Signed)
Pt discharged per MD order. IV removed. Discharge instructions reviewed with with pt. Pt verbalized understanding. All questions answered to pt satisfaction. Pt taken downstairs in wheelchair by staff.

## 2019-07-20 DIAGNOSIS — K2960 Other gastritis without bleeding: Secondary | ICD-10-CM | POA: Diagnosis not present

## 2019-07-20 DIAGNOSIS — G4733 Obstructive sleep apnea (adult) (pediatric): Secondary | ICD-10-CM | POA: Diagnosis not present

## 2019-07-20 DIAGNOSIS — M79605 Pain in left leg: Secondary | ICD-10-CM | POA: Diagnosis not present

## 2019-07-20 DIAGNOSIS — K922 Gastrointestinal hemorrhage, unspecified: Secondary | ICD-10-CM | POA: Diagnosis not present

## 2019-07-20 DIAGNOSIS — M79604 Pain in right leg: Secondary | ICD-10-CM | POA: Diagnosis not present

## 2019-07-20 NOTE — Discharge Summary (Signed)
[facility] [facility] [place] [facility]   PATIENT NAME: Cheryl Goodman    MR#:  [id number]  DATE OF BIRTH:  1941-03-03  DATE OF ADMISSION:  07/16/2019 ADMITTING PHYSICIAN: Milagros LollSrikar Aubriel Khanna, MD  DATE OF DISCHARGE: 07/18/2019 11:21 AM  PRIMARY CARE PHYSICIAN: Danella PentonMiller, Mark F, MD   ADMISSION DIAGNOSIS:  Gastrointestinal hemorrhage, unspecified gastrointestinal hemorrhage type [K92.2] Anemia, unspecified type [D64.9]  DISCHARGE DIAGNOSIS:  Active Problems:   GI bleed   Gastroesophageal reflux disease with esophagitis without hemorrhage   Stomach irritation   Hiatal hernia   Melena   SECONDARY DIAGNOSIS:   Past Medical History:  Diagnosis Date  . Arthritis   . Dysphagia   . GERD (gastroesophageal reflux disease)   . History of hiatal hernia   . History of kidney stones    x2 ; passed independently  . Hyperlipidemia   . Hypertension   . Nausea      ADMITTING HISTORY  Cheryl Goodman  is a 78 y.o. female with a known history of gastric ulcer/erosive gastritis, and, hiatal hernia, arthritis presents to the emergency room sent in by her primary care physician after she had dizziness and weakness for 2 weeks and found to have anemia with hemoglobin 6.2.  She has noticed dark tarry stools.  Stool positive for blood.  Patient has arthritis and takes ibuprofen every day.  Had cough with PPIs and stopped.  No bright red blood per rectum.  No shortness of breath or chest pain.  Not on any anticoagulants.  She is scheduled for EGD and colonoscopy in December.  She had erosive gastritis on EGD in the past.  HOSPITAL COURSE:   *Upper GI bleed with melena and symptomatic acute blood loss anemia Transfused 2 unit packed RBC. Started on IV Protonix.  EGD showed esophagitis and multiple erosive gastric ulcers likely secondary to NSAID use.  Appreciate GI input. Patient tolerated diet after EGD.  Hemoglobin remained stable.  Discharged home after discontinuing her ibuprofen  from her home medication list.  Started on twice daily PPIs and Carafate.  H. pylori test ordered and pending.  Patient will follow-up with GI.  *Hypertension. Continue home medications.  *Arthritis. Continue tramadol from home.  *Asymptomatic bacteriuria. Patient received 1 dose of ceftriaxone in the emergency room. No dysuria or frequency or hematuria. Will not continue antibiotics.  Stable for discharge home  CONSULTS OBTAINED:    DRUG ALLERGIES:   Allergies  Allergen Reactions  . Penicillins Rash and Other (See Comments)    Has patient had a PCN reaction causing immediate rash, facial/tongue/throat swelling, SOB or lightheadedness with hypotension: No Has patient had a PCN reaction causing severe rash involving mucus membranes or skin necrosis: No Has patient had a PCN reaction that required hospitalization: No Has patient had a PCN reaction occurring within the last 10 years: No If all of the above answers are "NO", then may proceed with Cephalosporin use.   . Ace Inhibitors Cough  . Dexilant [Dexlansoprazole] Other (See Comments)    syncope  . Oxycodone Nausea Only  . Sulfa Antibiotics Swelling    DISCHARGE MEDICATIONS:   Allergies as of 07/18/2019      Reactions   Penicillins Rash, Other (See Comments)   Has patient had a PCN reaction causing immediate rash, facial/tongue/throat swelling, SOB or lightheadedness with hypotension: No Has patient had a PCN reaction causing severe rash involving mucus membranes or skin necrosis: No Has patient had a PCN reaction that required hospitalization: No Has patient had  a PCN reaction occurring within the last 10 years: No If all of the above answers are "NO", then may proceed with Cephalosporin use.   Ace Inhibitors Cough   Dexilant [dexlansoprazole] Other (See Comments)   syncope   Oxycodone Nausea Only   Sulfa Antibiotics Swelling      Medication List    STOP taking these medications   rivaroxaban 10 MG Tabs  tablet Commonly known as: XARELTO     TAKE these medications   Biotin 1000 MCG tablet Take 1,000 mcg by mouth daily.   ferrous sulfate 325 (65 FE) MG EC tablet Take 1 tablet (325 mg total) by mouth 2 (two) times daily with a meal.   levocetirizine 5 MG tablet Commonly known as: XYZAL Take 5 mg by mouth as needed.   montelukast 10 MG tablet Commonly known as: SINGULAIR Take 10 mg by mouth at bedtime.   pantoprazole 40 MG tablet Commonly known as: PROTONIX Take 1 tablet (40 mg total) by mouth 2 (two) times daily before a meal. What changed: when to take this   sucralfate 1 g tablet Commonly known as: CARAFATE Take 1 g by mouth 2 (two) times daily.   traMADol 50 MG tablet Commonly known as: ULTRAM Take 50 mg by mouth every 6 (six) hours as needed for moderate pain.   vitamin B-12 1000 MCG tablet Commonly known as: CYANOCOBALAMIN Take 1,000 mcg by mouth daily.   Vitamin D3 125 MCG (5000 UT) Tabs Take 5,000 Units by mouth daily.       Today   VITAL SIGNS:  Blood pressure (!) 145/64, pulse 79, temperature 98.2 F (36.8 C), temperature source Oral, resp. rate 20, height 4\' 11"  (1.499 m), weight 84.1 kg, SpO2 94 %.  I/O:  No intake or output data in the 24 hours ending 07/20/19 1441  PHYSICAL EXAMINATION:  Physical Exam  GENERAL:  78 y.o.-year-old patient lying in the bed with no acute distress.  LUNGS: Normal breath sounds bilaterally, no wheezing, rales,rhonchi or crepitation. No use of accessory muscles of respiration.  CARDIOVASCULAR: S1, S2 normal. No murmurs, rubs, or gallops.  ABDOMEN: Soft, non-tender, non-distended. Bowel sounds present. No organomegaly or mass.  NEUROLOGIC: Moves all 4 extremities. PSYCHIATRIC: The patient is alert and oriented x 3.  SKIN: No obvious rash, lesion, or ulcer.   DATA REVIEW:   CBC Recent Labs  Lab 07/17/19 0454 07/17/19 1518 07/18/19 0607  WBC 7.8  --   --   HGB 6.4* 8.2* 8.3*  HCT 21.9* 27.4*  --   PLT 265  --    --     Chemistries  Recent Labs  Lab 07/17/19 0454  NA 142  K 4.1  CL 110  CO2 25  GLUCOSE 103*  BUN 15  CREATININE 0.79  CALCIUM 8.0*    Cardiac Enzymes No results for input(s): TROPONINI in the last 168 hours.  Microbiology Results  Results for orders placed or performed during the hospital encounter of 07/16/19  SARS CORONAVIRUS 2 (TAT 6-24 HRS) Nasopharyngeal Nasopharyngeal Swab     Status: None   Collection Time: 07/16/19  9:25 PM   Specimen: Nasopharyngeal Swab  Result Value Ref Range Status   SARS Coronavirus 2 NEGATIVE NEGATIVE Final    Comment: (NOTE) SARS-CoV-2 target nucleic acids are NOT DETECTED. The SARS-CoV-2 RNA is generally detectable in upper and lower respiratory specimens during the acute phase of infection. Negative results do not preclude SARS-CoV-2 infection, do not rule out co-infections with other pathogens, and  should not be used as the sole basis for treatment or other patient management decisions. Negative results must be combined with clinical observations, patient history, and epidemiological information. The expected result is Negative. Fact Sheet for Patients: SugarRoll.be Fact Sheet for Healthcare Providers: https://www.woods-mathews.com/ This test is not yet approved or cleared by the [place] FDA and  has been authorized for detection and/or diagnosis of SARS-CoV-2 by FDA under an Emergency Use Authorization (EUA). This EUA will remain  in effect (meaning this test can be used) for the duration of the COVID-19 declaration under Section 56 4(b)(1) of the Act, 21 U.S.C. section 360bbb-3(b)(1), unless the authorization is terminated or revoked sooner. Performed at [facility], [place] [address] [place], [place] [address]     RADIOLOGY:  No results found.  Follow up with PCP in 1 week.  Management plans discussed with the patient, family and they are in agreement.  CODE  STATUS:  Code Status History    Date Active Date Inactive Code Status Order ID Comments User Context   07/16/2019 2001 07/18/2019 1426 Full Code [id number]  Hillary Bow, MD ED   10/31/2017 1645 11/02/2017 1845 Full Code [id number]  Gaynelle Arabian, MD Inpatient   Advance Care Planning Activity    Advance Directive Documentation     Most Recent Value  Type of Advance Directive  Healthcare Power of Attorney, Living will  Pre-existing out of facility DNR order (yellow form or pink MOST form)  -  "MOST" Form in Place?  -      TOTAL TIME TAKING CARE OF THIS PATIENT ON DAY OF DISCHARGE: more than 30 minutes.   Leia Alf Tariya Morrissette M.D on 07/20/2019 at 2:41 PM  Between 7am to 6pm - Pager - [phone]  After 6pm go to [email or web address] - password [email or web address] [facility]  Office  [phone]  CC: Primary care physician; [name], MD  Note: This dictation was prepared with Dragon dictation along with smaller phrase technology. Any transcriptional errors that result from this process are unintentional.

## 2019-07-24 ENCOUNTER — Other Ambulatory Visit: Payer: Self-pay

## 2019-07-24 NOTE — Patient Outreach (Signed)
[facility]) Care Management  [place]  07/24/2019   Cheryl Goodman [birth year] [id number]  Subjective:  Telephone call to patient for EMMI call. Patient reports having some down feelings due to recent hospitalization and not bouncing back as fast as she thinks she should.  Discussed with patient giving herself time and magnitude of her most recent hospitalization.  She verbalized understanding.  Discussed [place] service and patient agreeable to nurse calls during this time.   Patient states she lives with her spouse who is supportive.  She also has children and grandchildren that she is anxious to get back involved with.  Patient able to complete activities of daily living but acknowledges weakness.  Discussed with patient recent hemoglobin and signs of recurrent GI bleed.  Encouraged patient to pace herself with activities until her body feels she is able to do more.  She verbalized understanding.  Patient experiencing some pain to her back and recently had injection to her right hip.  She reports that pain is worse with standing up. Patient states she does have some diarrhea, which has been caused by her iron pills.  She states it always has caused diarrhea.  Discussed maintaining fluid intake to avoid dehydration and reporting to physician if diarrhea continues to be an issue. She verbalized understanding.       Objective:   Encounter Medications:  Outpatient Encounter Medications as of 07/24/2019  Medication Sig  . Biotin 1000 MCG tablet Take 1,000 mcg by mouth daily.  . Cholecalciferol (VITAMIN D3) 125 MCG (5000 UT) TABS Take 5,000 Units by mouth daily.  . ferrous sulfate 325 (65 FE) MG EC tablet Take 1 tablet (325 mg total) by mouth 2 (two) times daily with a meal.  . levocetirizine (XYZAL) 5 MG tablet Take 5 mg by mouth as needed.   . pantoprazole (PROTONIX) 40 MG tablet Take 1 tablet (40 mg total) by mouth 2 (two) times daily before a meal.  . sucralfate  (CARAFATE) 1 g tablet Take 1 g by mouth 2 (two) times daily.  . traMADol (ULTRAM) 50 MG tablet Take 50 mg by mouth every 6 (six) hours as needed for moderate pain.  . vitamin B-12 (CYANOCOBALAMIN) 1000 MCG tablet Take 1,000 mcg by mouth daily.  . montelukast (SINGULAIR) 10 MG tablet Take 10 mg by mouth at bedtime.   No facility-administered encounter medications on file as of 07/24/2019.     Functional Status:  In your present state of health, do you have any difficulty performing the following activities: 07/24/2019 07/16/2019  Hearing? N N  Vision? N N  Difficulty concentrating or making decisions? N N  Walking or climbing stairs? Y Y  Comment weakness -  Dressing or bathing? N N  Doing errands, shopping? N N  Preparing Food and eating ? N -  Using the Toilet? N -  In the past six months, have you accidently leaked urine? N -  Managing your Medications? N -  Managing your Finances? N -  Housekeeping or managing your Housekeeping? N -  Some recent data might be hidden    Fall/Depression Screening: Fall Risk  07/24/2019  Falls in the past year? 0   PHQ 2/9 Scores 07/24/2019  PHQ - 2 Score 1    Assessment: Patient recent hospitalization for Anemia/GI bleed.  Patient reports some weakness.  Follow up with PCP done on 07/20/2019.    Plan:  [facility] CM Care Plan Problem One     Most Recent Value  Care Plan Problem One  Recent hospitalization Anemia/ GI Bleed  Role Documenting the Problem One  Care Management Telephonic Coordinator  Care Plan for Problem One  Active  THN Long Term Goal   Patient will not readmit to hospital for GI Bleed within 31 days.  THN Long Term Goal Start Date  07/24/19  Interventions for Problem One Long Term Goal  Discussed medication adherence, importance of follow up, and pacing self with activities.  Discussed with patient signs of GI bleed.  THN CM Short Term Goal #1   Patient will follow up with GI physician within 14 days.  THN CM Short Term Goal #1  Start Date  07/24/19  Interventions for Short Term Goal #1  Discussed importance of follow up with physician.  THN CM Short Term Goal #2   Patient will report improvement in ability to perform activites of daily living within 14 days.  THN CM Short Term Goal #2 Start Date  07/24/19  Interventions for Short Term Goal #2  Discussed with patient importance of pacing actitivites allowing for rest.  Patient will verbalize feelings toward limitations.     RN CM will provide ongoing education and support to patient through phone calls.   RN CM will send welcome packet with consent to patient.   RN CM will send initial barriers letter, assessment, and care plan to primary care physician.   RN CM will contact patient in the next two weeks and patient agrees to next contact.    Bary Leriche, RN, MSN [facility] Care Management Care Management Coordinator Direct Line [phone] Cell [phone] Toll Free: [phone]  Fax: [phone]

## 2019-07-31 DIAGNOSIS — D649 Anemia, unspecified: Secondary | ICD-10-CM | POA: Diagnosis not present

## 2019-07-31 DIAGNOSIS — R1013 Epigastric pain: Secondary | ICD-10-CM | POA: Diagnosis not present

## 2019-07-31 DIAGNOSIS — R531 Weakness: Secondary | ICD-10-CM | POA: Diagnosis not present

## 2019-07-31 DIAGNOSIS — D500 Iron deficiency anemia secondary to blood loss (chronic): Secondary | ICD-10-CM | POA: Diagnosis not present

## 2019-08-01 ENCOUNTER — Emergency Department (HOSPITAL_COMMUNITY): Payer: Medicare HMO

## 2019-08-01 ENCOUNTER — Inpatient Hospital Stay (HOSPITAL_COMMUNITY)
Admission: EM | Admit: 2019-08-01 | Discharge: 2019-08-06 | DRG: 175 | Disposition: A | Discharge status: Home Health | Payer: Medicare HMO | Attending: Internal Medicine | Admitting: Internal Medicine

## 2019-08-01 ENCOUNTER — Ambulatory Visit: Payer: Medicare HMO | Admitting: Gastroenterology

## 2019-08-01 ENCOUNTER — Other Ambulatory Visit: Payer: Self-pay

## 2019-08-01 ENCOUNTER — Encounter (HOSPITAL_COMMUNITY): Payer: Self-pay

## 2019-08-01 DIAGNOSIS — M6250 Muscle wasting and atrophy, not elsewhere classified, unspecified site: Secondary | ICD-10-CM | POA: Diagnosis present

## 2019-08-01 DIAGNOSIS — R0602 Shortness of breath: Secondary | ICD-10-CM | POA: Diagnosis not present

## 2019-08-01 DIAGNOSIS — K2960 Other gastritis without bleeding: Secondary | ICD-10-CM | POA: Diagnosis not present

## 2019-08-01 DIAGNOSIS — Z8719 Personal history of other diseases of the digestive system: Secondary | ICD-10-CM

## 2019-08-01 DIAGNOSIS — I82442 Acute embolism and thrombosis of left tibial vein: Secondary | ICD-10-CM | POA: Diagnosis not present

## 2019-08-01 DIAGNOSIS — K449 Diaphragmatic hernia without obstruction or gangrene: Secondary | ICD-10-CM | POA: Diagnosis present

## 2019-08-01 DIAGNOSIS — E669 Obesity, unspecified: Secondary | ICD-10-CM | POA: Diagnosis present

## 2019-08-01 DIAGNOSIS — I2699 Other pulmonary embolism without acute cor pulmonale: Secondary | ICD-10-CM | POA: Diagnosis present

## 2019-08-01 DIAGNOSIS — R778 Other specified abnormalities of plasma proteins: Secondary | ICD-10-CM | POA: Diagnosis not present

## 2019-08-01 DIAGNOSIS — I82432 Acute embolism and thrombosis of left popliteal vein: Secondary | ICD-10-CM | POA: Diagnosis not present

## 2019-08-01 DIAGNOSIS — Z6837 Body mass index (BMI) 37.0-37.9, adult: Secondary | ICD-10-CM | POA: Diagnosis not present

## 2019-08-01 DIAGNOSIS — N3000 Acute cystitis without hematuria: Secondary | ICD-10-CM | POA: Diagnosis present

## 2019-08-01 DIAGNOSIS — I119 Hypertensive heart disease without heart failure: Secondary | ICD-10-CM | POA: Diagnosis present

## 2019-08-01 DIAGNOSIS — Z87442 Personal history of urinary calculi: Secondary | ICD-10-CM

## 2019-08-01 DIAGNOSIS — K5900 Constipation, unspecified: Secondary | ICD-10-CM | POA: Diagnosis present

## 2019-08-01 DIAGNOSIS — Z882 Allergy status to sulfonamides status: Secondary | ICD-10-CM

## 2019-08-01 DIAGNOSIS — R059 Cough, unspecified: Secondary | ICD-10-CM

## 2019-08-01 DIAGNOSIS — M7989 Other specified soft tissue disorders: Secondary | ICD-10-CM

## 2019-08-01 DIAGNOSIS — I728 Aneurysm of other specified arteries: Secondary | ICD-10-CM | POA: Diagnosis present

## 2019-08-01 DIAGNOSIS — I824Y2 Acute embolism and thrombosis of unspecified deep veins of left proximal lower extremity: Secondary | ICD-10-CM | POA: Diagnosis not present

## 2019-08-01 DIAGNOSIS — G4733 Obstructive sleep apnea (adult) (pediatric): Secondary | ICD-10-CM | POA: Diagnosis present

## 2019-08-01 DIAGNOSIS — Z20828 Contact with and (suspected) exposure to other viral communicable diseases: Secondary | ICD-10-CM | POA: Diagnosis present

## 2019-08-01 DIAGNOSIS — Z880 Allergy status to penicillin: Secondary | ICD-10-CM

## 2019-08-01 DIAGNOSIS — Z885 Allergy status to narcotic agent status: Secondary | ICD-10-CM

## 2019-08-01 DIAGNOSIS — K2100 Gastro-esophageal reflux disease with esophagitis, without bleeding: Secondary | ICD-10-CM | POA: Diagnosis not present

## 2019-08-01 DIAGNOSIS — K259 Gastric ulcer, unspecified as acute or chronic, without hemorrhage or perforation: Secondary | ICD-10-CM | POA: Diagnosis present

## 2019-08-01 DIAGNOSIS — R531 Weakness: Secondary | ICD-10-CM

## 2019-08-01 DIAGNOSIS — R0902 Hypoxemia: Secondary | ICD-10-CM | POA: Diagnosis not present

## 2019-08-01 DIAGNOSIS — N390 Urinary tract infection, site not specified: Secondary | ICD-10-CM | POA: Diagnosis not present

## 2019-08-01 DIAGNOSIS — K219 Gastro-esophageal reflux disease without esophagitis: Secondary | ICD-10-CM | POA: Diagnosis present

## 2019-08-01 DIAGNOSIS — M79609 Pain in unspecified limb: Secondary | ICD-10-CM | POA: Diagnosis not present

## 2019-08-01 DIAGNOSIS — Z9049 Acquired absence of other specified parts of digestive tract: Secondary | ICD-10-CM | POA: Diagnosis not present

## 2019-08-01 DIAGNOSIS — Z79899 Other long term (current) drug therapy: Secondary | ICD-10-CM

## 2019-08-01 DIAGNOSIS — I82412 Acute embolism and thrombosis of left femoral vein: Secondary | ICD-10-CM | POA: Diagnosis not present

## 2019-08-01 DIAGNOSIS — I82402 Acute embolism and thrombosis of unspecified deep veins of left lower extremity: Secondary | ICD-10-CM

## 2019-08-01 DIAGNOSIS — I2609 Other pulmonary embolism with acute cor pulmonale: Secondary | ICD-10-CM | POA: Diagnosis not present

## 2019-08-01 DIAGNOSIS — Z96651 Presence of right artificial knee joint: Secondary | ICD-10-CM | POA: Diagnosis present

## 2019-08-01 DIAGNOSIS — I2602 Saddle embolus of pulmonary artery with acute cor pulmonale: Secondary | ICD-10-CM | POA: Diagnosis not present

## 2019-08-01 DIAGNOSIS — R05 Cough: Secondary | ICD-10-CM

## 2019-08-01 DIAGNOSIS — Z79891 Long term (current) use of opiate analgesic: Secondary | ICD-10-CM

## 2019-08-01 DIAGNOSIS — B9689 Other specified bacterial agents as the cause of diseases classified elsewhere: Secondary | ICD-10-CM | POA: Diagnosis present

## 2019-08-01 DIAGNOSIS — N179 Acute kidney failure, unspecified: Secondary | ICD-10-CM | POA: Diagnosis present

## 2019-08-01 DIAGNOSIS — Z90710 Acquired absence of both cervix and uterus: Secondary | ICD-10-CM

## 2019-08-01 DIAGNOSIS — Z888 Allergy status to other drugs, medicaments and biological substances status: Secondary | ICD-10-CM

## 2019-08-01 DIAGNOSIS — I248 Other forms of acute ischemic heart disease: Secondary | ICD-10-CM | POA: Diagnosis not present

## 2019-08-01 DIAGNOSIS — R7989 Other specified abnormal findings of blood chemistry: Secondary | ICD-10-CM

## 2019-08-01 LAB — TROPONIN I (HIGH SENSITIVITY)
Troponin I (High Sensitivity): 81 ng/L — ABNORMAL HIGH (ref <18)
Troponin I (High Sensitivity): 73 ng/L — ABNORMAL HIGH (ref <18)

## 2019-08-01 LAB — COMPREHENSIVE METABOLIC PANEL
ALT: 21 U/L (ref 0–44)
AST: 20 U/L (ref 15–41)
Albumin: 3.4 g/dL — ABNORMAL LOW (ref 3.5–5.0)
Alkaline Phosphatase: 66 U/L (ref 38–126)
Anion gap: 13 (ref 5–15)
BUN: 20 mg/dL (ref 8–23)
CO2: 21 mmol/L — ABNORMAL LOW (ref 22–32)
Calcium: 9.1 mg/dL (ref 8.9–10.3)
Chloride: 105 mmol/L (ref 98–111)
Creatinine, Ser: 1.08 mg/dL — ABNORMAL HIGH (ref 0.44–1.00)
GFR calc Af Amer: 57 mL/min — ABNORMAL LOW (ref >60)
GFR calc non Af Amer: 49 mL/min — ABNORMAL LOW (ref >60)
Glucose, Bld: 135 mg/dL — ABNORMAL HIGH (ref 70–99)
Potassium: 3.8 mmol/L (ref 3.5–5.1)
Sodium: 139 mmol/L (ref 135–145)
Total Bilirubin: 0.7 mg/dL (ref 0.3–1.2)
Total Protein: 6.8 g/dL (ref 6.5–8.1)

## 2019-08-01 LAB — D-DIMER, QUANTITATIVE: D-Dimer, Quant: 14.29 ug/mL-FEU — ABNORMAL HIGH (ref 0.00–0.50)

## 2019-08-01 LAB — CBC WITH DIFFERENTIAL/PLATELET
Abs Immature Granulocytes: 0.11 10*3/uL — ABNORMAL HIGH (ref 0.00–0.07)
Basophils Absolute: 0.1 10*3/uL (ref 0.0–0.1)
Basophils Relative: 1 %
Eosinophils Absolute: 0 10*3/uL (ref 0.0–0.5)
Eosinophils Relative: 0 %
HCT: 39.1 % (ref 36.0–46.0)
Hemoglobin: 11.5 g/dL — ABNORMAL LOW (ref 12.0–15.0)
Immature Granulocytes: 1 %
Lymphocytes Relative: 11 %
Lymphs Abs: 1.5 10*3/uL (ref 0.7–4.0)
MCH: 25.1 pg — ABNORMAL LOW (ref 26.0–34.0)
MCHC: 29.4 g/dL — ABNORMAL LOW (ref 30.0–36.0)
MCV: 85.4 fL (ref 80.0–100.0)
Monocytes Absolute: 1.2 10*3/uL — ABNORMAL HIGH (ref 0.1–1.0)
Monocytes Relative: 9 %
Neutro Abs: 10.8 10*3/uL — ABNORMAL HIGH (ref 1.7–7.7)
Neutrophils Relative %: 78 %
Platelets: 306 10*3/uL (ref 150–400)
RBC: 4.58 MIL/uL (ref 3.87–5.11)
RDW: 22.5 % — ABNORMAL HIGH (ref 11.5–15.5)
WBC: 13.7 10*3/uL — ABNORMAL HIGH (ref 4.0–10.5)
nRBC: 0 % (ref 0.0–0.2)

## 2019-08-01 LAB — PROTIME-INR
INR: 1.1 (ref 0.8–1.2)
Prothrombin Time: 14.1 seconds (ref 11.4–15.2)

## 2019-08-01 LAB — APTT: aPTT: 29 seconds (ref 24–36)

## 2019-08-01 LAB — LIPASE, BLOOD: Lipase: 28 U/L (ref 11–51)

## 2019-08-01 LAB — BRAIN NATRIURETIC PEPTIDE: B Natriuretic Peptide: 1062.5 pg/mL — ABNORMAL HIGH (ref 0.0–100.0)

## 2019-08-01 MED ORDER — HEPARIN (PORCINE) 25000 UT/250ML-% IV SOLN
1000.0000 [IU]/h | INTRAVENOUS | Status: DC
  Administered 2019-08-01: 1000 [IU]/h via INTRAVENOUS
  Filled 2019-08-01: qty 250

## 2019-08-01 MED ORDER — HEPARIN BOLUS VIA INFUSION
5000.0000 [IU] | Freq: Once | INTRAVENOUS | Status: AC
  Administered 2019-08-01: 5000 [IU] via INTRAVENOUS
  Filled 2019-08-01: qty 5000

## 2019-08-01 MED ORDER — IOHEXOL 350 MG/ML SOLN
100.0000 mL | Freq: Once | INTRAVENOUS | Status: AC | PRN
  Administered 2019-08-01: 100 mL via INTRAVENOUS

## 2019-08-01 MED ORDER — SODIUM CHLORIDE 0.9% FLUSH
3.0000 mL | Freq: Two times a day (BID) | INTRAVENOUS | Status: DC
  Administered 2019-08-02 – 2019-08-06 (×6): 3 mL via INTRAVENOUS

## 2019-08-01 MED ORDER — HEPARIN (PORCINE) 25000 UT/250ML-% IV SOLN
1100.0000 [IU]/h | INTRAVENOUS | Status: DC
  Administered 2019-08-03: 1100 [IU]/h via INTRAVENOUS
  Filled 2019-08-01 (×2): qty 250

## 2019-08-01 MED ORDER — ORAL CARE MOUTH RINSE
15.0000 mL | Freq: Two times a day (BID) | OROMUCOSAL | Status: DC
  Administered 2019-08-02 – 2019-08-06 (×8): 15 mL via OROMUCOSAL

## 2019-08-01 MED ORDER — PANTOPRAZOLE SODIUM 40 MG IV SOLR
40.0000 mg | Freq: Two times a day (BID) | INTRAVENOUS | Status: DC
  Administered 2019-08-01 – 2019-08-05 (×8): 40 mg via INTRAVENOUS
  Filled 2019-08-01 (×8): qty 40

## 2019-08-01 MED ORDER — TRAMADOL HCL 50 MG PO TABS
50.0000 mg | ORAL_TABLET | Freq: Four times a day (QID) | ORAL | Status: DC | PRN
  Administered 2019-08-01 – 2019-08-06 (×7): 50 mg via ORAL
  Filled 2019-08-01 (×7): qty 1

## 2019-08-01 MED ORDER — ACETAMINOPHEN 325 MG PO TABS
650.0000 mg | ORAL_TABLET | Freq: Four times a day (QID) | ORAL | Status: DC | PRN
  Administered 2019-08-02: 650 mg via ORAL
  Filled 2019-08-01: qty 2

## 2019-08-01 MED ORDER — ACETAMINOPHEN 650 MG RE SUPP: 650.0000 mg | Freq: Four times a day (QID) | RECTAL | Status: DC | PRN

## 2019-08-01 NOTE — ED Triage Notes (Addendum)
Pt BIB EMS from home. Pt reports generalized weakness. Pt went to PCP yesterday for possible UTI and started taking Cipro today. A&O x4. Denies fever, chills, and body aches. Pt reports slight increase of SHOB over the last week.  95% RA 122/70 HR 99 CBG 171

## 2019-08-01 NOTE — ED Provider Notes (Signed)
  Provider Note MRN:  [id number]  Arrival date & time: 08/01/19    ED Course and Medical Decision Making  Assumed care from PA Venter at shift change.  Given patient's symptoms and known DVT and suspected PE, heparin was empirically started.  This is a particularly challenging management case given her recent GI bleed.  Dosing was discussed with pharmacy and we decided upon a heparin infusion while holding off on a heparin bolus given her otherwise favorable hemodynamic status.  6:40 PM update: CT appears concerning for bilateral segmental pulmonary embolism, will await radiology read to determine if there is evidence of heart strain.  Patient continues to have reassuring vital signs and is on heparin.  CT confirms bilateral PE with heart strain, will admit to intensivist service.  .Critical Care Performed by: Maudie Flakes, MD Authorized by: Maudie Flakes, MD   Critical care provider statement:    Critical care time (minutes):  35   Critical care time was exclusive of:  Separately billable procedures and treating other patients   Critical care was necessary to treat or prevent imminent or life-threatening deterioration of the following conditions:  Circulatory failure (Pulmonary embolism with right heart strain)   Critical care was time spent personally by me on the following activities:  Discussions with consultants, evaluation of patient's response to treatment, examination of patient, ordering and performing treatments and interventions, ordering and review of laboratory studies, ordering and review of radiographic studies, pulse oximetry, re-evaluation of patient's condition, obtaining history from patient or surrogate and review of old charts   I assumed direction of critical care for this patient from another provider in my specialty: yes       Final Clinical Impressions(s) / ED Diagnoses     ICD-10-CM   1. Generalized weakness  R53.1   2. SOB (shortness of breath)  R06.02    3. AKI (acute kidney injury) ([place])  N17.9   4. Acute deep vein thrombosis (DVT) of proximal vein of left lower extremity (HCC)  I82.4Y2   5. Other acute pulmonary embolism with acute cor pulmonale ([place])  I26.09     ED Discharge Orders    None      Discharge Instructions   None     Barth Kirks. [place] Small, [place] [email or web address]    Maudie Flakes, MD 08/01/19 Lurena Nida

## 2019-08-01 NOTE — ED Provider Notes (Signed)
[facility]-EMERGENCY DEPT Provider Note   CSN: [id number] Arrival date & time: 08/01/19  1512     History   Chief Complaint Chief Complaint  Patient presents with  . Weakness    HPI Cheryl Goodman is a 78 y.o. female with PMHx HTN, HLD, GERD, GI bleed who presents to the ED today with gradual onset, constant, worsening, generalized weakness s/p being discharged from the hospital on 10/14 after GI bleed with requirement of 2 units PRBC.   Per chart review:  [facility] ED 10/12 for anemia found on bloodwork at PCP's office with Hgb 6.2. Initially had gone to PCP for complaint of dizziness and generalized weakness and dark tarry stools. Admitted for GI bleed. Received 2 units PRBCs.  EGD done while in the hospital which showed esophagitis and multiple erosive gastric ulcers. Started on twice daily PPIs and Carafate. She did have H pylori test done which returned positive - had appt scheduled with GI today for follow up but cancelled and came to the ED instead for continued weakness.   Pt also complains of worsening shortness of breath for the past 2-3 days. She reports she is more short of breath with exertion. She does endorse an occasional cough but states she normally always has a cough; unchanged from baseline.  Also reports that for the past several days her left leg has been bothering her.  Reports it appears more swollen around the ankle compared to her right leg.  No history of DVT/PE.  Patient is not anticoagulated.  No hemoptysis.   She denies fever, chills, chest pain, abdominal pain, nausea, vomiting, diarrhea, blood in stool, melena, diarrhea, urinary frequency, any other associated symptoms.      The history is provided by the patient and medical records.    Past Medical History:  Diagnosis Date  . Arthritis   . Dysphagia   . GERD (gastroesophageal reflux disease)   . History of hiatal hernia   . History of kidney stones    x2 ; passed  independently  . Hyperlipidemia   . Hypertension   . Nausea     Patient Active Problem List   Diagnosis Date Noted  . Gastroesophageal reflux disease with esophagitis without hemorrhage   . Stomach irritation   . Hiatal hernia   . Melena   . GI bleed 07/16/2019  . OA (osteoarthritis) of knee 10/31/2017    Past Surgical History:  Procedure Laterality Date  . ABDOMINAL HYSTERECTOMY    . APPENDECTOMY    . BREAST BIOPSY Right   . BREAST SURGERY    . CARPAL TUNNEL RELEASE    . CHOLECYSTECTOMY    . COLONOSCOPY WITH PROPOFOL N/A 09/01/2015   Procedure: COLONOSCOPY WITH PROPOFOL;  Surgeon: Scot Jun, MD;  Location: [facility] ENDOSCOPY;  Service: Endoscopy;  Laterality: N/A;  . ESOPHAGOGASTRODUODENOSCOPY N/A 07/17/2019   Procedure: ESOPHAGOGASTRODUODENOSCOPY (EGD);  Surgeon: Pasty Spillers, MD;  Location: [facility] ENDOSCOPY;  Service: Endoscopy;  Laterality: N/A;  . REDUCTION MAMMAPLASTY Bilateral   . SAVORY DILATION N/A 09/01/2015   Procedure: SAVORY DILATION;  Surgeon: Scot Jun, MD;  Location: [facility] ENDOSCOPY;  Service: Endoscopy;  Laterality: N/A;  . TOTAL KNEE ARTHROPLASTY Right 10/31/2017   Procedure: RIGHT TOTAL KNEE ARTHROPLASTY;  Surgeon: Ollen Gross, MD;  Location: [facility] ORS;  Service: Orthopedics;  Laterality: Right;     OB History    Gravida  5   Para  1   Term      Preterm  AB  0   Living  5     SAB      TAB      Ectopic  0   Multiple      Live Births               Home Medications    Prior to Admission medications   Medication Sig Start Date End Date Taking? Authorizing Provider  Biotin 1000 MCG tablet Take 1,000 mcg by mouth daily.    [provider]  Cholecalciferol (VITAMIN D3) 125 MCG (5000 UT) TABS Take 5,000 Units by mouth daily.    [provider]  ferrous sulfate 325 (65 FE) MG EC tablet Take 1 tablet (325 mg total) by mouth 2 (two) times daily with a meal. 07/18/19 08/17/19  Sudini, Alveta Heimlich, MD   levocetirizine (XYZAL) 5 MG tablet Take 5 mg by mouth as needed.  09/08/17   [provider]  montelukast (SINGULAIR) 10 MG tablet Take 10 mg by mouth at bedtime. 07/08/17   [provider]  pantoprazole (PROTONIX) 40 MG tablet Take 1 tablet (40 mg total) by mouth 2 (two) times daily before a meal. 07/18/19   Sudini, Srikar, MD  sucralfate (CARAFATE) 1 g tablet Take 1 g by mouth 2 (two) times daily.    [provider]  traMADol (ULTRAM) 50 MG tablet Take 50 mg by mouth every 6 (six) hours as needed for moderate pain.    [provider]  vitamin B-12 (CYANOCOBALAMIN) 1000 MCG tablet Take 1,000 mcg by mouth daily.    [provider]    Family History Family History  Problem Relation Age of Onset  . Breast cancer Maternal Grandmother        in 62's    Social History Social History   Tobacco Use  . Smoking status: Never Smoker  . Smokeless tobacco: Never Used  Substance Use Topics  . Alcohol use: No  . Drug use: No     Allergies   Penicillins, Ace inhibitors, Dexilant [dexlansoprazole], Oxycodone, and Sulfa antibiotics   Review of Systems Review of Systems  Constitutional: Negative for chills and fever.  HENT: Negative for congestion.   Eyes: Negative for visual disturbance.  Respiratory: Positive for shortness of breath.   Cardiovascular: Positive for leg swelling. Negative for chest pain and palpitations.  Gastrointestinal: Negative for abdominal pain, constipation, diarrhea, nausea and vomiting.  Genitourinary: Negative for difficulty urinating, dysuria, flank pain and frequency.  Musculoskeletal: Negative for myalgias.  Skin: Negative for rash.  Neurological: Negative for headaches.     Physical Exam Updated Vital Signs BP 138/89   Pulse 98   Temp 98.9 F (37.2 C) (Oral)   Resp 16   Ht 4\' 11"  (1.499 m)   Wt 84.1 kg   SpO2 94%   BMI 37.45 kg/m   Physical Exam Vitals signs and nursing note reviewed.   Constitutional:      Appearance: She is not ill-appearing or diaphoretic.     Comments: Elderly female  HENT:     Head: Normocephalic and atraumatic.  Eyes:     Conjunctiva/sclera: Conjunctivae normal.  Neck:     Musculoskeletal: Neck supple.  Cardiovascular:     Rate and Rhythm: Normal rate and regular rhythm.     Pulses: Normal pulses.  Pulmonary:     Effort: Pulmonary effort is normal.     Breath sounds: Normal breath sounds. No wheezing, rhonchi or rales.     Comments: Occasional cough. Appears to  be working slightly harder to breathe. No retractions. Placed on 2L [place] after oxygen saturation dipped to 90% on RA. Satting 96% on 2L [place].  Abdominal:     Palpations: Abdomen is soft.     Tenderness: There is no abdominal tenderness. There is no right CVA tenderness, left CVA tenderness, guarding or rebound.  Musculoskeletal:     Left lower leg: Edema present.     Comments: Left ankle and distal lower extremity appears slightly more swollen compared to RLE. Mild tenderness to the posterior aspect of the LLE. ROM intact to ankle and knee. No tenderness to RLE. Strength and sensation intact throughout BLEs. 2+ DP and PT pulses.   Skin:    General: Skin is warm and dry.  Neurological:     Mental Status: She is alert.      ED Treatments / Results  Labs (all labs ordered are listed, but only abnormal results are displayed) Labs Reviewed  CBC WITH DIFFERENTIAL/PLATELET - Abnormal; Notable for the following components:      Result Value   WBC 13.7 (*)    Hemoglobin 11.5 (*)    MCH 25.1 (*)    MCHC 29.4 (*)    RDW 22.5 (*)    All other components within normal limits  BRAIN NATRIURETIC PEPTIDE - Abnormal; Notable for the following components:   B Natriuretic Peptide 1,062.5 (*)    All other components within normal limits  D-DIMER, QUANTITATIVE (NOT AT [facility]) - Abnormal; Notable for the following components:   D-Dimer, Quant 14.29 (*)    All other components within normal limits   COMPREHENSIVE METABOLIC PANEL - Abnormal; Notable for the following components:   CO2 21 (*)    Glucose, Bld 135 (*)    Creatinine, Ser 1.08 (*)    Albumin 3.4 (*)    GFR calc non Af Amer 49 (*)    GFR calc Af Amer 57 (*)    All other components within normal limits  TROPONIN I (HIGH SENSITIVITY) - Abnormal; Notable for the following components:   Troponin I (High Sensitivity) 81 (*)    All other components within normal limits  SARS CORONAVIRUS 2 (TAT 6-24 HRS)  LIPASE, BLOOD  APTT  PROTIME-INR  URINALYSIS, ROUTINE W REFLEX MICROSCOPIC  CBC  HEPARIN LEVEL (UNFRACTIONATED)  TROPONIN I (HIGH SENSITIVITY)    EKG EKG Interpretation  Date/Time:  Wednesday August 01 2019 16:34:16 EDT Ventricular Rate:  93 PR Interval:    QRS Duration: 107 QT Interval:  378 QTC Calculation: 471 R Axis:   117 Text Interpretation: Sinus rhythm Low voltage, precordial leads Probable right ventricular hypertrophy Nonspecific T abnormalities, diffuse leads Confirmed by Kennis Carina [phone]) on 08/01/2019 4:51:48 PM   Radiology Dg Chest Port 1 View  Result Date: 08/01/2019 CLINICAL DATA:  78 year old female with history of shortness of breath and generalized weakness. EXAM: PORTABLE CHEST 1 VIEW COMPARISON:  Chest x-ray 02/17/2013. FINDINGS: Lung volumes are normal. No consolidative airspace disease. No pleural effusions. No evidence of pulmonary edema. No pneumothorax. No definite suspicious appearing pulmonary nodules or masses are noted. Moderate cardiomegaly. Upper mediastinal contours are within normal limits. Aortic atherosclerosis. Large hiatal hernia. IMPRESSION: 1. No radiographic evidence of acute cardiopulmonary disease. 2. Moderate cardiomegaly. 3. Aortic atherosclerosis. 4. Large hiatal hernia. Electronically Signed   By: Trudie Reed M.D.   On: 08/01/2019 16:48   Vas [place] Lower Extremity Venous (dvt) (mc And Wl 7a-7p)  Result Date: 08/01/2019  Lower Venous Study Indications:  Swelling, and  Pain.  Comparison Study: no prior Performing Technologist: Blanch Media RVS  Examination Guidelines: A complete evaluation includes B-mode imaging, spectral Doppler, color Doppler, and power Doppler as needed of all accessible portions of each vessel. Bilateral testing is considered an integral part of a complete examination. Limited examinations for reoccurring indications may be performed as noted.  +---------+---------------+---------+-----------+----------+--------------+ LEFT     CompressibilityPhasicitySpontaneityPropertiesThrombus Aging +---------+---------------+---------+-----------+----------+--------------+ CFV      None           No       No                   Acute          +---------+---------------+---------+-----------+----------+--------------+ SFJ      None                                         Acute          +---------+---------------+---------+-----------+----------+--------------+ FV Prox  None                                         Acute          +---------+---------------+---------+-----------+----------+--------------+ FV Mid   None                                         Acute          +---------+---------------+---------+-----------+----------+--------------+ FV DistalNone                                         Acute          +---------+---------------+---------+-----------+----------+--------------+ PFV      None                                         Acute          +---------+---------------+---------+-----------+----------+--------------+ POP      None           No       No                   Acute          +---------+---------------+---------+-----------+----------+--------------+ PTV      None                                         Acute          +---------+---------------+---------+-----------+----------+--------------+ PERO                                                  Not visualized  +---------+---------------+---------+-----------+----------+--------------+     Summary: Left: Findings consistent with acute deep vein thrombosis involving the left common femoral vein, left femoral vein, left proximal profunda vein, left popliteal vein, and left posterior tibial  veins. No cystic structure found in the popliteal fossa.  *See table(s) above for measurements and observations.    Preliminary     Procedures Procedures (including critical care time)  Medications Ordered in ED Medications  heparin ADULT infusion 100 units/mL (25000 units/22250mL sodium chloride 0.45%) (has no administration in time range)     Initial Impression / Assessment and Plan / ED Course  I have reviewed the triage vital signs and the nursing notes.  Pertinent labs & imaging results that were available during my care of the patient were reviewed by me and considered in my medical decision making (see chart for details).    78 year old female who presents to the ED today with worsening generalized weakness and shortness of breath status post being discharged from the hospital approximately 2 weeks ago for GI bleed requiring 2 units packed red blood cells.  Also been complaining of some left lower leg pain and slight swelling.  Her ankle does appear slightly swollen on exam compared to right.  Given this as well as shortness of breath there is concern for DVT and PE.  Will obtain DVT study and and addition of D-dimer.  If blood clot found on DVT study or D-dimer elevated will proceed with CTA.  Also obtain screening labs to ensure that hemoglobin is at baseline.  Patient is denying any return of bloody stools.  Also appears stable today.  She is afebrile.  Her pulse is in the high 90s.  She does not require oxygen at home but initially dipped down to 90% for a prolonged period of time while examining her.  I have placed patient on 2 L with increase in oxygen saturation.  Patient may require admission.   Received  call from nursing staff who states that ultrasound tech told her that patient's whole left leg is "clotted."  Preliminary result with DVT in several deep veins. Have added on CTA at this time. Very strong suspicion for PE at this time. Heparin per pharmacy consult ordered per Dr. Pilar PlateBero.   Dimer significantly elevated at 14.29.  CBC with stable hemoglobin 11.5.  This is significantly increased from previous.  White blood cells mildly elevated at 13.7 although chest x-ray without concerning for pneumonia.  It does appear that patient was started on Cipro today with concern for UTI from PCP.  Awaiting urinalysis here.   Discussed case with pharmacist; given recent GI bleed will give heparin infusion and hold off on bolus until CTA definitely returns with findings of PE. Along with elevated d dimer, trop elevated at 81, and BNP > 1,000.   5:52 PM At shift change case signed out to attending physician Dr. Pilar PlateBero; plan is to follow up on CTA and get patient admitted.    Final Clinical Impressions(s) / ED Diagnoses   Final diagnoses:  Generalized weakness  SOB (shortness of breath)  AKI (acute kidney injury) (HCC)  Acute deep vein thrombosis (DVT) of proximal vein of left lower extremity [facility](HCC)    ED Discharge Orders    None       Tanda RockersVenter, Jusiah Aguayo, PA-C 08/01/19 1755    Sabas SousBero, Michael M, MD 08/01/19 [phone]

## 2019-08-01 NOTE — Progress Notes (Addendum)
[place] for IV heparin Indication: DVT, possible PE  Allergies  Allergen Reactions  . Penicillins Rash and Other (See Comments)    Has patient had a PCN reaction causing immediate rash, facial/tongue/throat swelling, SOB or lightheadedness with hypotension: No Has patient had a PCN reaction causing severe rash involving mucus membranes or skin necrosis: No Has patient had a PCN reaction that required hospitalization: No Has patient had a PCN reaction occurring within the last 10 years: No If all of the above answers are "NO", then may proceed with Cephalosporin use.   . Ace Inhibitors Cough  . Dexilant [Dexlansoprazole] Other (See Comments)    syncope  . Oxycodone Nausea Only  . Sulfa Antibiotics Swelling    Patient Measurements: Height: 4\' 11"  (149.9 cm) Weight: 185 lb 6.5 oz (84.1 kg) IBW/kg (Calculated) : 43.2 Heparin Dosing Weight: 63 kg  Vital Signs: Temp: 98.9 F (37.2 C) (10/28 1531) Temp Source: Oral (10/28 1531) BP: 138/89 (10/28 1600) Pulse Rate: 98 (10/28 1600)  Labs: Recent Labs    08/01/19 1607  HGB 11.5*  HCT 39.1  PLT 306  APTT 29  LABPROT 14.1  INR 1.1  CREATININE 1.08*  TROPONINIHS 81*    Estimated Creatinine Clearance: 40.4 mL/min (A) (by C-G formula based on SCr of 1.08 mg/dL (H)).   Medical History: Past Medical History:  Diagnosis Date  . Arthritis   . Dysphagia   . GERD (gastroesophageal reflux disease)   . History of hiatal hernia   . History of kidney stones    x2 ; passed independently  . Hyperlipidemia   . Hypertension   . Nausea     Medications:  (Not in a hospital admission)  Scheduled:   Assessment: 56 yoF with PMH HTN, HLD, GERD, recent admission for GIB 2 wks ago requiring transfusion, presents with weakness, worsening SOB x 2-3 days, and LLE swelling. D-dimer markedly elevated and extensive LLE DVT found on [place]; strong suspicion for PE. Pharmacy consulted to dose IV heparin  infusion.  Discussed concerns over recent GIB with EDP. GIB determined to be d/t multiple gastric ulcers; Hgb resolved after PRBC transfusion x 2 and remains stable. Patient reports no further hematochezia. However, EDP OK to dose more conservatively pending results of CTA (or if patient decompensates)   Baseline INR, aPTT: WNL  Prior anticoagulation: none  Significant events:  Today, 08/01/2019:  CBC: Hgb slightly low but stable, improved from prior admission; Plt stable WNL  No bleeding or infusion issues per nursing  Goal of Therapy: Heparin level 0.3-0.7 units/ml Monitor platelets by anticoagulation protocol: Yes  Plan:  Hold off on bolus for now   Heparin 1000 units/hr IV infusion (16 units/kg)  If CTA shows submassive or massive PE (or patient otherwise destabilizes), would bolus 5000 units and increase rate to 1100 units/hr  Check heparin level 8 hrs after start  Daily CBC, daily heparin level once stable  Monitor for signs of bleeding or thrombosis, especially new GIB   Reuel Boom, PharmD, BCPS [phone] 08/01/2019, 5:50 PM    Addendum  __________________________________________________________________________   A/P: Note per CT, patient with bilateral PE with evidence of right heart strain (RV/LV Ratio = 1.33) consistent with at least submassive (intermediate risk) PE. Per RPh note above, will give 5000 unit IV heparin bolus then increase rate from 1000 to 1100 units/hr. Will Check heparin level 8 hours after bolus and increase in rate  Adrian Saran, PharmD, BCPS 08/01/2019 8:14 PM

## 2019-08-01 NOTE — ED Notes (Signed)
Patient transported to CT 

## 2019-08-01 NOTE — Progress Notes (Signed)
Lower extremity venous has been completed.   Preliminary results in CV Proc.   Results given to RN.  Abram Sander 08/01/2019 4:29 PM

## 2019-08-01 NOTE — Progress Notes (Signed)
Nocturnal CPAP held at this time. Pending COVID results. RT will continue to follow.

## 2019-08-01 NOTE — H&P (Addendum)
History and Physical    Cheryl GuysBeatrice R Channing ZOX:[id number] DOB: 29-Dec-1940 DOA: 08/01/2019  PCP: Danella PentonMiller, Mark F, MD  Patient coming from: Home  I have personally briefly reviewed patient's [facility]  Chief Complaint: Dyspnea  HPI: Cheryl GuysBeatrice R Goodman is a 78 y.o. female with medical history significant for erosive gastritis with recent upper GI bleeding, hypertension, hyperlipidemia, and OSA who presents to the ED for evaluation of dyspnea and left leg pain.  Patient was [facility] from 07/16/2019-07/18/2019 for upper GI bleed due to erosive gastritis seen on EGD 07/17/2019.  She required transfusion of 2 units PRBCs and was discharged on oral PPI therapy..  Patient states that since discharge from the hospital she has been having some pain in her legs.  Over the last week she has developed dyspnea on exertion with progressive lethargy and weakness.  She denies any associated chest pain or palpitations.  She reports a chronic cough which she attributes to her reflux.  Cough is nonproductive and she denies any hemoptysis.  She denies any other recent obvious bleeding since discharge from the hospital including epistaxis, hematemesis, coffee-ground emesis, vaginal bleeding, hematuria, hematochezia, or melena.  ED Course:  Initial vitals showed BP 130/89, pulse 98, RR 16, temp 98.9 Fahrenheit, SPO2 94% on room air.  Labs notable for WBC 13.7, hemoglobin 11.5, platelets 306,000, sodium 139, potassium 3.8, BUN 20, creatinine 1.08, lipase 28, BNP 1062.l5, high-sensitivity troponin I 81 then 73, D-dimer 14.29.  SARS-CoV-2 test was obtained and pending.  Venous ultrasound left lower extremity preliminary findings showed acute DVT of the left common femoral vein, left femoral vein, left proximal profunda vein, left popliteal vein, and left posterior tibial veins.  CTA chest PE studies showed extensive bilateral pulmonary emboli in the distal  central, lobar and segmental arteries throughout both lungs with CT evidence of right heart strain suggestive of submassive PE.  A 1.1 cm splenic artery aneurysm was seen, unchanged from 2016 study.  EDP discussed the case with PCCM who recommended hospitalist admission and discussion with IR for IVC filter placement.  EDP discussed with IR who are aware of case and holding off on IVC filter placement as patient currently on anticoagulation.  The hospitalist service was consulted admit for further evaluation and management.  Review of Systems: All systems reviewed and are negative except as documented in history of present illness above.   Past Medical History:  Diagnosis Date  . Arthritis   . Dysphagia   . GERD (gastroesophageal reflux disease)   . History of hiatal hernia   . History of kidney stones    x2 ; passed independently  . Hyperlipidemia   . Hypertension   . Nausea     Past Surgical History:  Procedure Laterality Date  . ABDOMINAL HYSTERECTOMY    . APPENDECTOMY    . BREAST BIOPSY Right   . BREAST SURGERY    . CARPAL TUNNEL RELEASE    . CHOLECYSTECTOMY    . COLONOSCOPY WITH PROPOFOL N/A 09/01/2015   Procedure: COLONOSCOPY WITH PROPOFOL;  Surgeon: Scot Junobert T Elliott, MD;  [facility];  Service: Endoscopy;  Laterality: N/A;  . ESOPHAGOGASTRODUODENOSCOPY N/A 07/17/2019   Procedure: ESOPHAGOGASTRODUODENOSCOPY (EGD);  Surgeon: Pasty Spillersahiliani, Varnita B, MD;  [facility];  Service: Endoscopy;  Laterality: N/A;  . REDUCTION MAMMAPLASTY Bilateral   . SAVORY DILATION N/A 09/01/2015   Procedure: SAVORY DILATION;  Surgeon: Scot Junobert T Elliott, MD;  [facility];  Service: Endoscopy;  Laterality: N/A;  . TOTAL KNEE ARTHROPLASTY Right 10/31/2017   Procedure: RIGHT TOTAL KNEE ARTHROPLASTY;  Surgeon: Gaynelle Arabian, MD;  Location: [facility] ORS;  Service: Orthopedics;  Laterality: Right;    Social History:  reports that she has never smoked. She has never used  smokeless tobacco. She reports that she does not drink alcohol or use drugs.  Allergies  Allergen Reactions  . Penicillins Rash and Other (See Comments)    Has patient had a PCN reaction causing immediate rash, facial/tongue/throat swelling, SOB or lightheadedness with hypotension: No Has patient had a PCN reaction causing severe rash involving mucus membranes or skin necrosis: No Has patient had a PCN reaction that required hospitalization: No Has patient had a PCN reaction occurring within the last 10 years: No If all of the above answers are "NO", then may proceed with Cephalosporin use.   . Ace Inhibitors Cough  . Dexilant [Dexlansoprazole] Other (See Comments)    syncope  . Oxycodone Nausea Only  . Sulfa Antibiotics Swelling    Family History  Problem Relation Age of Onset  . Breast cancer Maternal Grandmother        in 3's     Prior to Admission medications   Medication Sig Start Date End Date Taking? Authorizing Provider  Biotin 1000 MCG tablet Take 1,000 mcg by mouth daily.   Yes [provider]  Cholecalciferol (VITAMIN D3) 125 MCG (5000 UT) TABS Take 5,000 Units by mouth daily.   Yes [provider]  ciprofloxacin (CIPRO) 500 MG tablet Take 500 mg by mouth 2 (two) times daily. 07/31/19  Yes [provider]  Iron-Vitamin C (VITRON-C) 65-125 MG TABS Take 1 tablet by mouth daily.   Yes [provider]  pantoprazole (PROTONIX) 40 MG tablet Take 1 tablet (40 mg total) by mouth 2 (two) times daily before a meal. 07/18/19  Yes Sudini, Srikar, MD  sucralfate (CARAFATE) 1 g tablet Take 1 g by mouth 2 (two) times daily.   Yes [provider]  traMADol (ULTRAM) 50 MG tablet Take 50 mg by mouth every 6 (six) hours as needed for moderate pain.   Yes [provider]  ferrous sulfate 325 (65 FE) MG EC tablet Take 1 tablet (325 mg total) by mouth 2 (two) times daily with a meal. Patient not taking: Reported on 08/01/2019 07/18/19  08/17/19  Hillary Bow, MD    Physical Exam: Vitals:   08/01/19 1815 08/01/19 1830 08/01/19 1910 08/01/19 2100  BP: 139/89 (!) 127/95 (!) 139/91 125/82  Pulse: 88  96 90  Resp: (!) 24 (!) 30 (!) 25 (!) 22  Temp:      TempSrc:      SpO2: 100%  96% 95%  Weight:      Height:        Constitutional: Obese woman resting supine in bed, NAD, calm, comfortable Eyes: PERRL, lids and conjunctivae normal ENMT: Mucous membranes are moist. Posterior pharynx clear of any exudate or lesions.Normal dentition.  Neck: normal, supple, no masses. Respiratory: clear to auscultation bilaterally, no wheezing, no crackles. Normal respiratory effort. No accessory muscle use.  Cardiovascular: Regular rate and rhythm, no murmurs / rubs / gallops. No extremity edema. 2+ pedal pulses. Abdomen: no tenderness, no masses palpated. No hepatosplenomegaly. Bowel sounds positive.  Musculoskeletal: no clubbing / cyanosis. No joint deformity upper and lower extremities. Good ROM, no contractures. Normal muscle tone.  Skin: no rashes, lesions, ulcers. No induration Neurologic: CN 2-12 grossly intact. Sensation intact, Strength 5/5 in  all 4.  Psychiatric: Normal judgment and insight. Alert and oriented x 3. Normal mood.     Labs on Admission: I have personally reviewed following labs and imaging studies  CBC: Recent Labs  Lab 08/01/19 1607  WBC 13.7*  NEUTROABS 10.8*  HGB 11.5*  HCT 39.1  MCV 85.4  PLT 306   Basic Metabolic Panel: Recent Labs  Lab 08/01/19 1607  NA 139  K 3.8  CL 105  CO2 21*  GLUCOSE 135*  BUN 20  CREATININE 1.08*  CALCIUM 9.1   GFR: Estimated Creatinine Clearance: 40.4 mL/min (A) (by C-G formula based on SCr of 1.08 mg/dL (H)). Liver Function Tests: Recent Labs  Lab 08/01/19 1607  AST 20  ALT 21  ALKPHOS 66  BILITOT 0.7  PROT 6.8  ALBUMIN 3.4*   Recent Labs  Lab 08/01/19 1607  LIPASE 28   No results for input(s): AMMONIA in the last 168 hours. Coagulation  Profile: Recent Labs  Lab 08/01/19 1607  INR 1.1   Cardiac Enzymes: No results for input(s): CKTOTAL, CKMB, CKMBINDEX, TROPONINI in the last 168 hours. BNP (last 3 results) No results for input(s): PROBNP in the last 8760 hours. HbA1C: No results for input(s): HGBA1C in the last 72 hours. CBG: No results for input(s): GLUCAP in the last 168 hours. Lipid Profile: No results for input(s): CHOL, HDL, LDLCALC, TRIG, CHOLHDL, LDLDIRECT in the last 72 hours. Thyroid Function Tests: No results for input(s): TSH, T4TOTAL, FREET4, T3FREE, THYROIDAB in the last 72 hours. Anemia Panel: No results for input(s): VITAMINB12, FOLATE, FERRITIN, TIBC, IRON, RETICCTPCT in the last 72 hours. Urine analysis:    Component Value Date/Time   COLORURINE Straw 02/17/2013 1708   APPEARANCEUR Clear 02/17/2013 1708   LABSPEC 1.004 02/17/2013 1708   PHURINE 6.0 02/17/2013 1708   GLUCOSEU Negative 02/17/2013 1708   HGBUR Negative 02/17/2013 1708   BILIRUBINUR Negative 02/17/2013 1708   KETONESUR Negative 02/17/2013 1708   PROTEINUR Negative 02/17/2013 1708   NITRITE Negative 02/17/2013 1708   LEUKOCYTESUR 1+ 02/17/2013 1708    Radiological Exams on Admission: Ct Angio Chest Pe W/cm &/or Wo Cm  Result Date: 08/01/2019 CLINICAL DATA:  Shortness of breath, PE suspected EXAM: CT ANGIOGRAPHY CHEST WITH CONTRAST TECHNIQUE: Multidetector CT imaging of the chest was performed using the standard protocol during bolus administration of intravenous contrast. Multiplanar CT image reconstructions and MIPs were obtained to evaluate the vascular anatomy. CONTRAST:  OMNIPAQUE IOHEXOL 350 MG/ML SOLN COMPARISON:  CT 08/14/2015 FINDINGS: Cardiovascular: Satisfactory opacification of the pulmonary arteries to the segmental level. There are extensive bilateral pulmonary emboli in the distal central, lobar and segmental arteries throughout both lungs. There is associated right heart strain with flattening of the  intraventricular septum and elevation of the RV/LV ratio (1.33). There is right heart enlargement and reflux of contrast into the hepatic veins. Atherosclerotic calcifications of the normal caliber thoracic aorta. Normal branching of the aortic arch. Mediastinum/Nodes: No enlarged mediastinal, hilar or axillary lymph nodes. Thyroid gland and thoracic inlet are unremarkable. No acute abnormality of the trachea side from posterior bowing which is seen with imaging during exhalation. There is a large hiatal hernia. Lungs/Pleura: Mosaic attenuation of the lungs can be seen in the setting of imaging during exhalation. There are diminished lung volumes with basilar areas of bandlike opacity likely reflecting atelectasis and/or scarring. No consolidation, features of edema, pneumothorax, or effusion. No suspicious pulmonary nodules or masses. Upper Abdomen: Patient is post cholecystectomy. There is a calcified 1.1  cm splenic artery aneurysm which is unchanged since 2016. No acute abnormalities present in the visualized portions of the upper abdomen. Scattered colonic diverticula without focal pericolonic inflammation to suggest diverticulitis. Musculoskeletal: Stable compression deformity and retropulsion of the T12 vertebral body no acute or suspicious osseous lesions. No concerning chest wall lesions. Review of the MIP images confirms the above findings. IMPRESSION: 1. Extensive bilateral pulmonary emboli in the distal central, lobar and segmental arteries throughout both lungs with evidence of right heart strain. Positive for acute PE with CT evidence of right heart strain (RV/LV Ratio = 1.33) consistent with at least submassive (intermediate risk) PE. The presence of right heart strain has been associated with an increased risk of morbidity and mortality. Please activate Code PE by paging [phone]. 2. Large hiatal hernia. 3. Aortic Atherosclerosis (ICD10-I70.0). 4. 1.1 cm splenic artery aneurysm. Finding is  unchanged since 2016 and has a low overall risk of rupture. Could consider surveillance imaging on an annual basis. Recommendation is based on the consensus guidelines: Managing incidental findings on abdominal and pelvic CT and MRI, Part 2: white paper of the ACR Incidental Findings Committee II on vascular findings. J Am Coll Radiol. 2013;10 (10): 789-94. doi:10.1016/j.jacr.2013.05.021 Critical Value/emergent results were called by telephone at the time of interpretation on 08/01/2019 at 7:17 pm to providerDr Pilar Plate, who verbally acknowledged these results. Electronically Signed   By: Kreg Shropshire M.D.   On: 08/01/2019 19:19   Dg Chest Port 1 View  Result Date: 08/01/2019 CLINICAL DATA:  78 year old female with history of shortness of breath and generalized weakness. EXAM: PORTABLE CHEST 1 VIEW COMPARISON:  Chest x-ray 02/17/2013. FINDINGS: Lung volumes are normal. No consolidative airspace disease. No pleural effusions. No evidence of pulmonary edema. No pneumothorax. No definite suspicious appearing pulmonary nodules or masses are noted. Moderate cardiomegaly. Upper mediastinal contours are within normal limits. Aortic atherosclerosis. Large hiatal hernia. IMPRESSION: 1. No radiographic evidence of acute cardiopulmonary disease. 2. Moderate cardiomegaly. 3. Aortic atherosclerosis. 4. Large hiatal hernia. Electronically Signed   By: Trudie Reed M.D.   On: 08/01/2019 16:48   Vas [place] Lower Extremity Venous (dvt) (mc And Wl 7a-7p)  Result Date: 08/01/2019  Lower Venous Study Indications: Swelling, and Pain.  Comparison Study: no prior Performing Technologist: Blanch Media RVS  Examination Guidelines: A complete evaluation includes B-mode imaging, spectral Doppler, color Doppler, and power Doppler as needed of all accessible portions of each vessel. Bilateral testing is considered an integral part of a complete examination. Limited examinations for reoccurring indications may be performed as noted.   +---------+---------------+---------+-----------+----------+--------------+ LEFT     CompressibilityPhasicitySpontaneityPropertiesThrombus Aging +---------+---------------+---------+-----------+----------+--------------+ CFV      None           No       No                   Acute          +---------+---------------+---------+-----------+----------+--------------+ SFJ      None                                         Acute          +---------+---------------+---------+-----------+----------+--------------+ FV Prox  None  Acute          +---------+---------------+---------+-----------+----------+--------------+ FV Mid   None                                         Acute          +---------+---------------+---------+-----------+----------+--------------+ FV DistalNone                                         Acute          +---------+---------------+---------+-----------+----------+--------------+ PFV      None                                         Acute          +---------+---------------+---------+-----------+----------+--------------+ POP      None           No       No                   Acute          +---------+---------------+---------+-----------+----------+--------------+ PTV      None                                         Acute          +---------+---------------+---------+-----------+----------+--------------+ PERO                                                  Not visualized +---------+---------------+---------+-----------+----------+--------------+     Summary: Left: Findings consistent with acute deep vein thrombosis involving the left common femoral vein, left femoral vein, left proximal profunda vein, left popliteal vein, and left posterior tibial veins. No cystic structure found in the popliteal fossa.  *See table(s) above for measurements and observations.    Preliminary     EKG:  Independently reviewed. Sinus rhythm with low voltage, nonspecific T wave changes in all leads.  Assessment/Plan Principal Problem:   Bilateral pulmonary embolism (HCC) Active Problems:   Gastroesophageal reflux disease with esophagitis without hemorrhage   Acute deep vein thrombosis (DVT) of left lower extremity (HCC)  WILMA MICHAELSON is a 78 y.o. female with medical history significant for erosive gastritis with recent upper GI bleeding, hypertension, hyperlipidemia, and OSA who is admitted with multiple bilateral PE and LLE DVT.   Multiple bilateral pulmonary emboli with extensive LLE DVT: Currently hemodynamically stable.  Started on IV heparin anticoagulation in the ED.  Need to monitor closely for bleeding given her recent upper GI bleed.  If we are unable to continue anticoagulation we will need to contact IR again for possible IVC filter placement. -Continue IV heparin per pharmacy -Obtain echocardiogram -Monitor closely for signs/symptoms of bleeding -Admit to stepdown unit  Erosive gastritis with recent upper GI bleeding: Admitted at [facility] from 07/16/2019-07/18/2019.  Hemoglobin currently stable however need to monitor closely as above now that she is requiring anticoagulation. -Will use IV PPI twice daily for now for upper GI protection  OSA: Continue CPAP at night.  DVT prophylaxis: Heparin anticoagulation Code Status: Full code, confirmed with patient Family Communication: Discussed with patient, she has updated her husband Disposition Plan: Pending clinical progress Consults called: EDP discussed with PCCM, IR aware of case Admission status: Inpatient for management of bilateral PE, extensive LLE DVT as she is high risk for decompensation given recent admission for upper GI bleeding in the setting of erosive gastritis.   Darreld Mclean MD [facility]  If 7PM-7AM, please contact night-coverage [email or web address]  08/01/2019, 9:37 PM

## 2019-08-01 NOTE — Progress Notes (Signed)
Patient ID: Cheryl Goodman, female   DOB: [birth year], 78 y.o.   MRN: [id number]   Interventional Radiology consulted about emergent IVC filter placement for a patient with submassive pulmonary embolism, left lower extremity DVT and recent history of GI bleeding.  According to Critical Care, patient is currently hemodynamically stable and not considering thrombolytic therapy at this time.  Critical care is concerned that patient will need a filter if she starts to have recurrent GI bleeding.  Currently the patient is on IV heparin and does not need a filter but IR is aware of the situation and can provide filter placement if the anticoagulation needs to be stopped.

## 2019-08-01 NOTE — ED Notes (Signed)
ED TO INPATIENT HANDOFF REPORT  Name/Age/Gender Cheryl Goodman 78 y.o. female  Code Status    Code Status Orders  (From admission, onward)         Start     Ordered   08/01/19 2132  Full code  Continuous     08/01/19 2132        Code Status History    Date Active Date Inactive Code Status Order ID Comments User Context   07/16/2019 2001 07/18/2019 1426 Full Code [id number]  Milagros Loll, MD ED   10/31/2017 1645 11/02/2017 1845 Full Code [id number]  Ollen Gross, MD Inpatient   Advance Care Planning Activity    Advance Directive Documentation     Most Recent Value  Type of Advance Directive  Healthcare Power of Attorney, Living will  Pre-existing out of facility DNR order (yellow form or pink MOST form)  -  "MOST" Form in Place?  -      Home/SNF/Other Home  Chief Complaint Weakness  Level of Care/Admitting Diagnosis ED Disposition    ED Disposition Condition Comment   Admit  Hospital Area: [facility] [[id number]]  Level of Care: Stepdown [14]  Admit to SDU based on following criteria: Severe physiological/psychological symptoms:  Any diagnosis requiring assessment & intervention at least every 4 hours on an ongoing basis to obtain desired patient outcomes including stability and rehabilitation  Covid Evaluation: Asymptomatic Screening Protocol (No Symptoms)  Diagnosis: Bilateral pulmonary embolism [facility]) [[id number]]  Admitting Physician: Charlsie Quest [[id number]]  Attending Physician: Charlsie Quest [[id number]]  Estimated length of stay: past midnight tomorrow  Certification:: I certify this patient will need inpatient services for at least 2 midnights  PT Class (Do Not Modify): Inpatient [101]  PT Acc Code (Do Not Modify): Private [1]       Medical History Past Medical History:  Diagnosis Date  . Arthritis   . Dysphagia   . GERD (gastroesophageal reflux disease)   . History of hiatal hernia   . History of kidney stones    x2 ; passed  independently  . Hyperlipidemia   . Hypertension   . Nausea     Allergies Allergies  Allergen Reactions  . Penicillins Rash and Other (See Comments)    Has patient had a PCN reaction causing immediate rash, facial/tongue/throat swelling, SOB or lightheadedness with hypotension: No Has patient had a PCN reaction causing severe rash involving mucus membranes or skin necrosis: No Has patient had a PCN reaction that required hospitalization: No Has patient had a PCN reaction occurring within the last 10 years: No If all of the above answers are "NO", then may proceed with Cephalosporin use.   . Ace Inhibitors Cough  . Dexilant [Dexlansoprazole] Other (See Comments)    syncope  . Oxycodone Nausea Only  . Sulfa Antibiotics Swelling    IV Location/Drains/Wounds Patient Lines/Drains/Airways Status   Active Line/Drains/Airways    Name:   Placement date:   Placement time:   Site:   Days:   Peripheral IV 08/01/19 Right Antecubital   08/01/19    1619    Antecubital   less than 1          Labs/Imaging Results for orders placed or performed during the hospital encounter of 08/01/19 (from the past 48 hour(s))  CBC with Differential     Status: Abnormal   Collection Time: 08/01/19  4:07 PM  Result Value Ref Range   WBC 13.7 (H) 4.0 - 10.5 K/uL   RBC  4.58 3.87 - 5.11 MIL/uL   Hemoglobin 11.5 (L) 12.0 - 15.0 g/dL   HCT 39.1 36.0 - 46.0 %   MCV 85.4 80.0 - 100.0 fL   MCH 25.1 (L) 26.0 - 34.0 pg   MCHC 29.4 (L) 30.0 - 36.0 g/dL   RDW 22.5 (H) 11.5 - 15.5 %   Platelets 306 150 - 400 K/uL   nRBC 0.0 0.0 - 0.2 %   Neutrophils Relative % 78 %   Neutro Abs 10.8 (H) 1.7 - 7.7 K/uL   Lymphocytes Relative 11 %   Lymphs Abs 1.5 0.7 - 4.0 K/uL   Monocytes Relative 9 %   Monocytes Absolute 1.2 (H) 0.1 - 1.0 K/uL   Eosinophils Relative 0 %   Eosinophils Absolute 0.0 0.0 - 0.5 K/uL   Basophils Relative 1 %   Basophils Absolute 0.1 0.0 - 0.1 K/uL   Immature Granulocytes 1 %   Abs Immature  Granulocytes 0.11 (H) 0.00 - 0.07 K/uL   Acanthocytes PRESENT    Tear Drop Cells PRESENT    Burr Cells PRESENT    Polychromasia PRESENT    Ovalocytes PRESENT     Comment: Performed at [facility], [place] [address], [place], [place] [address]  Troponin I (High Sensitivity)     Status: Abnormal   Collection Time: 08/01/19  4:07 PM  Result Value Ref Range   Troponin I (High Sensitivity) 81 (H) <18 ng/L    Comment: (NOTE) Elevated high sensitivity troponin I (hsTnI) values and significant  changes across serial measurements may suggest ACS but many other  chronic and acute conditions are known to elevate hsTnI results.  Refer to the "Links" section for chest pain algorithms and additional  guidance. Performed at [facility], [place] [address] [place], [place] [address]   D-dimer, quantitative     Status: Abnormal   Collection Time: 08/01/19  4:07 PM  Result Value Ref Range   D-Dimer, Quant 14.29 (H) 0.00 - 0.50 ug/mL-FEU    Comment: (NOTE) At the manufacturer cut-off of 0.50 ug/mL FEU, this assay has been documented to exclude PE with a sensitivity and negative predictive value of 97 to 99%.  At this time, this assay has not been approved by the FDA to exclude DVT/VTE. Results should be correlated with clinical presentation. Performed at [facility], [place] [address] [place], [place] [address]   Comprehensive metabolic panel     Status: Abnormal   Collection Time: 08/01/19  4:07 PM  Result Value Ref Range   Sodium 139 135 - 145 mmol/L   Potassium 3.8 3.5 - 5.1 mmol/L   Chloride 105 98 - 111 mmol/L   CO2 21 (L) 22 - 32 mmol/L   Glucose, Bld 135 (H) 70 - 99 mg/dL   BUN 20 8 - 23 mg/dL   Creatinine, Ser 1.08 (H) 0.44 - 1.00 mg/dL   Calcium 9.1 8.9 - 10.3 mg/dL   Total Protein 6.8 6.5 - 8.1 g/dL   Albumin 3.4 (L) 3.5 - 5.0 g/dL   AST 20 15 - 41 U/L   ALT 21 0 - 44 U/L   Alkaline Phosphatase 66 38 - 126 U/L   Total  Bilirubin 0.7 0.3 - 1.2 mg/dL   GFR calc non Af Amer 49 (L) >60 mL/min   GFR calc Af Amer 57 (L) >60 mL/min   Anion gap 13 5 - 15    Comment: Performed at [facility], [place]  [address] [place], [place] [address]  Lipase, blood     Status: None   Collection Time: 08/01/19  4:07 PM  Result Value Ref Range   Lipase 28 11 - 51 U/L    Comment: Performed at [facility], [address], [place], [place] [address]  APTT     Status: None   Collection Time: 08/01/19  4:07 PM  Result Value Ref Range   aPTT 29 24 - 36 seconds    Comment: Performed at [facility], [address] [place], [place] [address]  Protime-INR     Status: None   Collection Time: 08/01/19  4:07 PM  Result Value Ref Range   Prothrombin Time 14.1 11.4 - 15.2 seconds   INR 1.1 0.8 - 1.2    Comment: (NOTE) INR goal varies based on device and disease states. Performed at [facility], [address] [place], [place] [address]   Brain natriuretic peptide     Status: Abnormal   Collection Time: 08/01/19  4:08 PM  Result Value Ref Range   B Natriuretic Peptide 1,062.5 (H) 0.0 - 100.0 pg/mL    Comment: Performed at [facility], [address] [place], [place] [address]  Troponin I (High Sensitivity)     Status: Abnormal   Collection Time: 08/01/19  6:08 PM  Result Value Ref Range   Troponin I (High Sensitivity) 73 (H) <18 ng/L    Comment: (NOTE) Elevated high sensitivity troponin I (hsTnI) values and significant  changes across serial measurements may suggest ACS but many other  chronic and acute conditions are known to elevate hsTnI results.  Refer to the "Links" section for chest pain algorithms and additional  guidance. Performed at [facility], [address] [place], [place] [address]    Ct Angio Chest Pe W/cm &/or Wo Cm  Result Date: 08/01/2019 CLINICAL DATA:  Shortness of breath, PE  suspected EXAM: CT ANGIOGRAPHY CHEST WITH CONTRAST TECHNIQUE: Multidetector CT imaging of the chest was performed using the standard protocol during bolus administration of intravenous contrast. Multiplanar CT image reconstructions and MIPs were obtained to evaluate the vascular anatomy. CONTRAST:  OMNIPAQUE IOHEXOL 350 MG/ML SOLN COMPARISON:  CT 08/14/2015 FINDINGS: Cardiovascular: Satisfactory opacification of the pulmonary arteries to the segmental level. There are extensive bilateral pulmonary emboli in the distal central, lobar and segmental arteries throughout both lungs. There is associated right heart strain with flattening of the intraventricular septum and elevation of the RV/LV ratio (1.33). There is right heart enlargement and reflux of contrast into the hepatic veins. Atherosclerotic calcifications of the normal caliber thoracic aorta. Normal branching of the aortic arch. Mediastinum/Nodes: No enlarged mediastinal, hilar or axillary lymph nodes. Thyroid gland and thoracic inlet are unremarkable. No acute abnormality of the trachea side from posterior bowing which is seen with imaging during exhalation. There is a large hiatal hernia. Lungs/Pleura: Mosaic attenuation of the lungs can be seen in the setting of imaging during exhalation. There are diminished lung volumes with basilar areas of bandlike opacity likely reflecting atelectasis and/or scarring. No consolidation, features of edema, pneumothorax, or effusion. No suspicious pulmonary nodules or masses. Upper Abdomen: Patient is post cholecystectomy. There is a calcified 1.1 cm splenic artery aneurysm which is unchanged since 2016. No acute abnormalities present in the visualized portions of the upper abdomen. Scattered colonic diverticula without focal pericolonic inflammation to suggest diverticulitis. Musculoskeletal: Stable compression deformity and retropulsion of the T12 vertebral body no acute or  suspicious osseous lesions. No  concerning chest wall lesions. Review of the MIP images confirms the above findings. IMPRESSION: 1. Extensive bilateral pulmonary emboli in the distal central, lobar and segmental arteries throughout both lungs with evidence of right heart strain. Positive for acute PE with CT evidence of right heart strain (RV/LV Ratio = 1.33) consistent with at least submassive (intermediate risk) PE. The presence of right heart strain has been associated with an increased risk of morbidity and mortality. Please activate Code PE by paging [phone]. 2. Large hiatal hernia. 3. Aortic Atherosclerosis (ICD10-I70.0). 4. 1.1 cm splenic artery aneurysm. Finding is unchanged since 2016 and has a low overall risk of rupture. Could consider surveillance imaging on an annual basis. Recommendation is based on the consensus guidelines: Managing incidental findings on abdominal and pelvic CT and MRI, Part 2: white paper of the ACR Incidental Findings Committee II on vascular findings. J Am Coll Radiol. 2013;10 (10): 789-94. doi:10.1016/j.jacr.2013.05.021 Critical Value/emergent results were called by telephone at the time of interpretation on 08/01/2019 at 7:17 pm to providerDr Pilar Plate, who verbally acknowledged these results. Electronically Signed   By: Kreg Shropshire M.D.   On: 08/01/2019 19:19   Dg Chest Port 1 View  Result Date: 08/01/2019 CLINICAL DATA:  78 year old female with history of shortness of breath and generalized weakness. EXAM: PORTABLE CHEST 1 VIEW COMPARISON:  Chest x-ray 02/17/2013. FINDINGS: Lung volumes are normal. No consolidative airspace disease. No pleural effusions. No evidence of pulmonary edema. No pneumothorax. No definite suspicious appearing pulmonary nodules or masses are noted. Moderate cardiomegaly. Upper mediastinal contours are within normal limits. Aortic atherosclerosis. Large hiatal hernia. IMPRESSION: 1. No radiographic evidence of acute cardiopulmonary disease. 2. Moderate cardiomegaly. 3. Aortic  atherosclerosis. 4. Large hiatal hernia. Electronically Signed   By: Trudie Reed M.D.   On: 08/01/2019 16:48   Vas [place] Lower Extremity Venous (dvt) (mc And Wl 7a-7p)  Result Date: 08/01/2019  Lower Venous Study Indications: Swelling, and Pain.  Comparison Study: no prior Performing Technologist: Blanch Media RVS  Examination Guidelines: A complete evaluation includes B-mode imaging, spectral Doppler, color Doppler, and power Doppler as needed of all accessible portions of each vessel. Bilateral testing is considered an integral part of a complete examination. Limited examinations for reoccurring indications may be performed as noted.  +---------+---------------+---------+-----------+----------+--------------+ LEFT     CompressibilityPhasicitySpontaneityPropertiesThrombus Aging +---------+---------------+---------+-----------+----------+--------------+ CFV      None           No       No                   Acute          +---------+---------------+---------+-----------+----------+--------------+ SFJ      None                                         Acute          +---------+---------------+---------+-----------+----------+--------------+ FV Prox  None                                         Acute          +---------+---------------+---------+-----------+----------+--------------+ FV Mid   None  Acute          +---------+---------------+---------+-----------+----------+--------------+ FV DistalNone                                         Acute          +---------+---------------+---------+-----------+----------+--------------+ PFV      None                                         Acute          +---------+---------------+---------+-----------+----------+--------------+ POP      None           No       No                   Acute          +---------+---------------+---------+-----------+----------+--------------+ PTV       None                                         Acute          +---------+---------------+---------+-----------+----------+--------------+ PERO                                                  Not visualized +---------+---------------+---------+-----------+----------+--------------+     Summary: Left: Findings consistent with acute deep vein thrombosis involving the left common femoral vein, left femoral vein, left proximal profunda vein, left popliteal vein, and left posterior tibial veins. No cystic structure found in the popliteal fossa.  *See table(s) above for measurements and observations.    Preliminary     Pending Labs Unresulted Labs (From admission, onward)    Start     Ordered   08/02/19 0500  CBC  Daily,   R     08/01/19 1749   08/02/19 0500  Heparin level (unfractionated)  Once-Timed,   STAT     08/01/19 2015   08/02/19 0500  Basic metabolic panel  Tomorrow morning,   R     08/01/19 2132   08/01/19 1658  SARS CORONAVIRUS 2 (TAT 6-24 HRS) Nasopharyngeal Nasopharyngeal Swab  (Asymptomatic/Tier 2 Patients Labs)  Once,   STAT    Question Answer Comment  Is this test for diagnosis or screening Screening   Symptomatic for COVID-19 as defined by CDC No   Hospitalized for COVID-19 No   Admitted to ICU for COVID-19 No   Previously tested for COVID-19 Yes   Resident in a congregate (group) care setting No   Employed in healthcare setting No   Pregnant No      08/01/19 1657   08/01/19 1608  Urinalysis, Routine w reflex microscopic  ONCE - STAT,   STAT     08/01/19 1608          Vitals/Pain Today's Vitals   08/01/19 1830 08/01/19 1910 08/01/19 1935 08/01/19 2100  BP: (!) 127/95 (!) 139/91  125/82  Pulse:  96  90  Resp: (!) 30 (!) 25  (!) 22  Temp:      TempSrc:      SpO2:  96%  95%  Weight:      Height:      PainSc:   7      Isolation Precautions No active isolations  Medications Medications  heparin ADULT infusion 100 units/mL (25000 units/24750mL  sodium chloride 0.45%) (1,100 Units/hr Intravenous Rate/Dose Change 08/01/19 2040)  sodium chloride flush (NS) 0.9 % injection 3 mL (has no administration in time range)  acetaminophen (TYLENOL) tablet 650 mg (has no administration in time range)    Or  acetaminophen (TYLENOL) suppository 650 mg (has no administration in time range)  traMADol (ULTRAM) tablet 50 mg (has no administration in time range)  pantoprazole (PROTONIX) injection 40 mg (has no administration in time range)  iohexol (OMNIPAQUE) 350 MG/ML injection 100 mL (100 mLs Intravenous Contrast Given 08/01/19 1834)  heparin bolus via infusion 5,000 Units (5,000 Units Intravenous Bolus from Bag 08/01/19 2040)    Mobility walks

## 2019-08-02 ENCOUNTER — Inpatient Hospital Stay (HOSPITAL_COMMUNITY): Payer: Medicare HMO

## 2019-08-02 DIAGNOSIS — I2699 Other pulmonary embolism without acute cor pulmonale: Secondary | ICD-10-CM

## 2019-08-02 DIAGNOSIS — R778 Other specified abnormalities of plasma proteins: Secondary | ICD-10-CM

## 2019-08-02 DIAGNOSIS — K2960 Other gastritis without bleeding: Secondary | ICD-10-CM

## 2019-08-02 DIAGNOSIS — K2100 Gastro-esophageal reflux disease with esophagitis, without bleeding: Secondary | ICD-10-CM

## 2019-08-02 DIAGNOSIS — I2602 Saddle embolus of pulmonary artery with acute cor pulmonale: Secondary | ICD-10-CM | POA: Diagnosis not present

## 2019-08-02 DIAGNOSIS — R7989 Other specified abnormal findings of blood chemistry: Secondary | ICD-10-CM

## 2019-08-02 DIAGNOSIS — I824Y2 Acute embolism and thrombosis of unspecified deep veins of left proximal lower extremity: Secondary | ICD-10-CM

## 2019-08-02 LAB — URINALYSIS, ROUTINE W REFLEX MICROSCOPIC
Bilirubin Urine: NEGATIVE
Glucose, UA: NEGATIVE mg/dL
Hgb urine dipstick: NEGATIVE
Ketones, ur: 5 mg/dL — AB
Nitrite: NEGATIVE
Protein, ur: 30 mg/dL — AB
Specific Gravity, Urine: >1.046 — ABNORMAL HIGH (ref 1.005–1.030)
pH: 5 (ref 5.0–8.0)

## 2019-08-02 LAB — HEPARIN LEVEL (UNFRACTIONATED)
Heparin Unfractionated: 0.64 IU/mL (ref 0.30–0.70)
Heparin Unfractionated: 0.41 IU/mL (ref 0.30–0.70)

## 2019-08-02 LAB — BASIC METABOLIC PANEL
Anion gap: 12 (ref 5–15)
BUN: 21 mg/dL (ref 8–23)
CO2: 19 mmol/L — ABNORMAL LOW (ref 22–32)
Calcium: 8.9 mg/dL (ref 8.9–10.3)
Chloride: 107 mmol/L (ref 98–111)
Creatinine, Ser: 1 mg/dL (ref 0.44–1.00)
GFR calc Af Amer: >60 mL/min (ref >60)
GFR calc non Af Amer: 54 mL/min — ABNORMAL LOW (ref >60)
Glucose, Bld: 139 mg/dL — ABNORMAL HIGH (ref 70–99)
Potassium: 3.7 mmol/L (ref 3.5–5.1)
Sodium: 138 mmol/L (ref 135–145)

## 2019-08-02 LAB — SARS CORONAVIRUS 2 (TAT 6-24 HRS): SARS Coronavirus 2: NEGATIVE

## 2019-08-02 LAB — CBC
HCT: 37.8 % (ref 36.0–46.0)
Hemoglobin: 11 g/dL — ABNORMAL LOW (ref 12.0–15.0)
MCH: 25.1 pg — ABNORMAL LOW (ref 26.0–34.0)
MCHC: 29.1 g/dL — ABNORMAL LOW (ref 30.0–36.0)
MCV: 86.3 fL (ref 80.0–100.0)
Platelets: 279 10*3/uL (ref 150–400)
RBC: 4.38 MIL/uL (ref 3.87–5.11)
RDW: 22.5 % — ABNORMAL HIGH (ref 11.5–15.5)
WBC: 13.9 10*3/uL — ABNORMAL HIGH (ref 4.0–10.5)
nRBC: 0 % (ref 0.0–0.2)

## 2019-08-02 LAB — ECHOCARDIOGRAM COMPLETE
Height: 59 in
Weight: 2966.51 oz

## 2019-08-02 LAB — MRSA PCR SCREENING: MRSA by PCR: NEGATIVE

## 2019-08-02 MED ORDER — CIPROFLOXACIN HCL 500 MG PO TABS
500.0000 mg | ORAL_TABLET | Freq: Two times a day (BID) | ORAL | Status: DC
  Administered 2019-08-02: 500 mg via ORAL
  Filled 2019-08-02: qty 1

## 2019-08-02 MED ORDER — CHLORHEXIDINE GLUCONATE CLOTH 2 % EX PADS
6.0000 | MEDICATED_PAD | Freq: Every day | CUTANEOUS | Status: DC
  Administered 2019-08-02 – 2019-08-06 (×5): 6 via TOPICAL

## 2019-08-02 MED ORDER — SENNOSIDES-DOCUSATE SODIUM 8.6-50 MG PO TABS
1.0000 | ORAL_TABLET | Freq: Two times a day (BID) | ORAL | Status: DC
  Administered 2019-08-02 – 2019-08-06 (×6): 1 via ORAL
  Filled 2019-08-02 (×9): qty 1

## 2019-08-02 NOTE — Progress Notes (Signed)
PROGRESS NOTE    GAGANDEEP KOSSMAN  BMW:[id number] DOB: 03-26-1941 DOA: 08/01/2019 PCP: Danella Penton, MD    Brief Narrative:  HPI per Dr. Haskel Khan is a 78 y.o. female with medical history significant for erosive gastritis with recent upper GI bleeding, hypertension, hyperlipidemia, and OSA who presents to the ED for evaluation of dyspnea and left leg pain.  Patient was recently hospitalized at [facility] hospital from 07/16/2019-07/18/2019 for upper GI bleed due to erosive gastritis seen on EGD 07/17/2019.  She required transfusion of 2 units PRBCs and was discharged on oral PPI therapy..  Patient states that since discharge from the hospital she has been having some pain in her legs.  Over the last week she has developed dyspnea on exertion with progressive lethargy and weakness.  She denies any associated chest pain or palpitations.  She reports a chronic cough which she attributes to her reflux.  Cough is nonproductive and she denies any hemoptysis.  She denies any other recent obvious bleeding since discharge from the hospital including epistaxis, hematemesis, coffee-ground emesis, vaginal bleeding, hematuria, hematochezia, or melena.  ED Course:  Initial vitals showed BP 130/89, pulse 98, RR 16, temp 98.9 Fahrenheit, SPO2 94% on room air.  Labs notable for WBC 13.7, hemoglobin 11.5, platelets 306,000, sodium 139, potassium 3.8, BUN 20, creatinine 1.08, lipase 28, BNP 1062.l5, high-sensitivity troponin I 81 then 73, D-dimer 14.29.  SARS-CoV-2 test was obtained and pending.  Venous ultrasound left lower extremity preliminary findings showed acute DVT of the left common femoral vein, left femoral vein, left proximal profunda vein, left popliteal vein, and left posterior tibial veins.  CTA chest PE studies showed extensive bilateral pulmonary emboli in the distal central, lobar and segmental arteries throughout both lungs with CT evidence of right heart strain suggestive  of submassive PE.  A 1.1 cm splenic artery aneurysm was seen, unchanged from 2016 study.  EDP discussed the case with PCCM who recommended hospitalist admission and discussion with IR for IVC filter placement.  EDP discussed with IR who are aware of case and holding off on IVC filter placement as patient currently on anticoagulation.  The hospitalist service was consulted admit for further evaluation and management.  Assessment & Plan:   Principal Problem:   Bilateral pulmonary embolism (HCC) Active Problems:   Gastroesophageal reflux disease with esophagitis without hemorrhage   Acute deep vein thrombosis (DVT) of left lower extremity (HCC)   Erosive gastritis   Elevated troponin   1 submassive bilateral pulmonary emboli/extensive left lower extremity DVT(left common femoral vein, left femoral vein, left proximal profunda vein, left popliteal vein, left posterior tibial veins.) Questionable etiology.  Patient presented with worsening shortness of breath, left lower extremity pain and swelling.  Left lower extremity Dopplers done consistent with extensive left lower extremity DVT (left common femoral vein, left femoral vein, left proximal profunda vein, left popliteal vein, left posterior tibial veins).  CT angiogram of chest consistent with extensive bilateral pulmonary emboli in the distal central, lobar and segmental arteries throughout both lungs with CT evidence of right heart strain suggestive of submassive PE). Patient noted to have recently been hospitalized at [facility] from 07/16/2019 to 07/18/2019 for upper GI bleed secondary to erosive gastritis seen on EGD of 07/17/2019.  Patient on PPI.  Patient denies any overt bleeding since being started on IV heparin.  2D echo pending.  Patient with a elevated BNP and elevated troponin likely due to right heart strain.  Continue IV heparin.  IR  was consulted about emergent IVC filter placement however.  IR note critical care fellow patient  hemodynamically stable and not considering thrombolytic therapy at this time.  It is felt that patient may need a filter if she starts to have recurrent GI bleed.  Patient with some hemodynamic stability at this point in time.  Due to patient's increased propensity to decompensate will consult with critical care medicine for further evaluation and management.  2.  Erosive gastritis with recent upper GI bleed Patient admitted at [facility] from 07/16/2019 to 07/18/2019.  Hemoglobin currently stable.  Patient denies any overt bleeding.  Continue IV PPI twice daily for now for upper GI protection.  H. pylori serology was done and [facility] and pending at the time of discharge.  Monitor closely for bleeding as patient on IV heparin.  3.  Elevated troponin Likely secondary to demand ischemia secondary to problem #1 from probable right ventricular strain.  Troponin was 81 and trended down to 73.  Patient with no overt chest pain.  2D echo pending.  Continue treatment as in problem #1.  Follow.  4.  OSA CPAP nightly.  DVT prophylaxis: Full dose heparin Code Status: Full Family Communication: Updated patient.  No family at bedside. Disposition Plan: Remain in stepdown unit.   Consultants:   PCCM pending   Interventional radiology: Dr. Lowella Dandy 08/01/2019  Procedures:   2D echo pending  CT angiogram chest 08/01/2019  Chest x-ray 08/01/2019  Lower extremity Doppler left leg 08/01/2019  Antimicrobials:  None   Subjective: Patient laying in bed.  States some improvement with shortness of breath since admission however currently not at baseline.  Denies any ongoing chest pain.  Feels left lower extremity swelling has improved however still with some pain.  Denies any overt bleeding.  No melanotic stools.  No hematemesis.  Objective: Vitals:   08/02/19 0700 08/02/19 0800 08/02/19 0819 08/02/19 1151  BP: 103/69 128/85    Pulse: 81 86    Resp: (!) 23 (!) 21    Temp:   98.9 F (37.2 C) 98 F (36.7  C)  TempSrc:   Axillary Oral  SpO2: 92% 96%    Weight:      Height:        Intake/Output Summary (Last 24 hours) at 08/02/2019 1509 Last data filed at 08/02/2019 0918 Gross per 24 hour  Intake 47 ml  Output 150 ml  Net -103 ml   Filed Weights   08/01/19 1527  Weight: 84.1 kg    Examination:  General exam: Appears calm and comfortable  Respiratory system: Clear to auscultation. Respiratory effort normal. Cardiovascular system: S1 & S2 heard, RRR. No JVD, murmurs, rubs, gallops or clicks. No pedal edema. Gastrointestinal system: Abdomen is nondistended, soft and nontender. No organomegaly or masses felt. Normal bowel sounds heard. Central nervous system: Alert and oriented. No focal neurological deficits. Extremities: Left lower extremity with some tenderness to palpation.  Skin: No rashes, lesions or ulcers Psychiatry: Judgement and insight appear normal. Mood & affect appropriate.     Data Reviewed: I have personally reviewed following labs and imaging studies  CBC: Recent Labs  Lab 08/01/19 1607 08/02/19 0521  WBC 13.7* 13.9*  NEUTROABS 10.8*  --   HGB 11.5* 11.0*  HCT 39.1 37.8  MCV 85.4 86.3  PLT 306 279   Basic Metabolic Panel: Recent Labs  Lab 08/01/19 1607 08/02/19 0521  NA 139 138  K 3.8 3.7  CL 105 107  CO2 21* 19*  GLUCOSE 135* 139*  BUN 20 21  CREATININE 1.08* 1.00  CALCIUM 9.1 8.9   GFR: Estimated Creatinine Clearance: 43.6 mL/min (by C-G formula based on SCr of 1 mg/dL). Liver Function Tests: Recent Labs  Lab 08/01/19 1607  AST 20  ALT 21  ALKPHOS 66  BILITOT 0.7  PROT 6.8  ALBUMIN 3.4*   Recent Labs  Lab 08/01/19 1607  LIPASE 28   No results for input(s): AMMONIA in the last 168 hours. Coagulation Profile: Recent Labs  Lab 08/01/19 1607  INR 1.1   Cardiac Enzymes: No results for input(s): CKTOTAL, CKMB, CKMBINDEX, TROPONINI in the last 168 hours. BNP (last 3 results) No results for input(s): PROBNP in the last 8760  hours. HbA1C: No results for input(s): HGBA1C in the last 72 hours. CBG: No results for input(s): GLUCAP in the last 168 hours. Lipid Profile: No results for input(s): CHOL, HDL, LDLCALC, TRIG, CHOLHDL, LDLDIRECT in the last 72 hours. Thyroid Function Tests: No results for input(s): TSH, T4TOTAL, FREET4, T3FREE, THYROIDAB in the last 72 hours. Anemia Panel: No results for input(s): VITAMINB12, FOLATE, FERRITIN, TIBC, IRON, RETICCTPCT in the last 72 hours. Sepsis Labs: No results for input(s): PROCALCITON, LATICACIDVEN in the last 168 hours.  Recent Results (from the past 240 hour(s))  SARS CORONAVIRUS 2 (TAT 6-24 HRS) Nasopharyngeal Nasopharyngeal Swab     Status: None   Collection Time: 08/01/19  5:31 PM   Specimen: Nasopharyngeal Swab  Result Value Ref Range Status   SARS Coronavirus 2 NEGATIVE NEGATIVE Final    Comment: (NOTE) SARS-CoV-2 target nucleic acids are NOT DETECTED. The SARS-CoV-2 RNA is generally detectable in upper and lower respiratory specimens during the acute phase of infection. Negative results do not preclude SARS-CoV-2 infection, do not rule out co-infections with other pathogens, and should not be used as the sole basis for treatment or other patient management decisions. Negative results must be combined with clinical observations, patient history, and epidemiological information. The expected result is Negative. Fact Sheet for Patients: SugarRoll.be Fact Sheet for Healthcare Providers: https://www.woods-mathews.com/ This test is not yet approved or cleared by the [place] FDA and  has been authorized for detection and/or diagnosis of SARS-CoV-2 by FDA under an Emergency Use Authorization (EUA). This EUA will remain  in effect (meaning this test can be used) for the duration of the COVID-19 declaration under Section 56 4(b)(1) of the Act, 21 U.S.C. section 360bbb-3(b)(1), unless the authorization is  terminated or revoked sooner. Performed at [facility], [place] [address], [place], [place] [address]   MRSA PCR Screening     Status: None   Collection Time: 08/01/19 10:45 PM   Specimen: Nasal Mucosa; Nasopharyngeal  Result Value Ref Range Status   MRSA by PCR NEGATIVE NEGATIVE Final    Comment:        The GeneXpert MRSA Assay (FDA approved for NASAL specimens only), is one component of a comprehensive MRSA colonization surveillance program. It is not intended to diagnose MRSA infection nor to guide or monitor treatment for MRSA infections. Performed at [facility], [place] [address] [place], [place] [address]          Radiology Studies: Ct Angio Chest Pe W/cm &/or Wo Cm  Result Date: 08/01/2019 CLINICAL DATA:  Shortness of breath, PE suspected EXAM: CT ANGIOGRAPHY CHEST WITH CONTRAST TECHNIQUE: Multidetector CT imaging of the chest was performed using the standard protocol during bolus administration of intravenous contrast. Multiplanar CT image reconstructions and MIPs were obtained to evaluate the vascular  anatomy. CONTRAST:  OMNIPAQUE IOHEXOL 350 MG/ML SOLN COMPARISON:  CT 08/14/2015 FINDINGS: Cardiovascular: Satisfactory opacification of the pulmonary arteries to the segmental level. There are extensive bilateral pulmonary emboli in the distal central, lobar and segmental arteries throughout both lungs. There is associated right heart strain with flattening of the intraventricular septum and elevation of the RV/LV ratio (1.33). There is right heart enlargement and reflux of contrast into the hepatic veins. Atherosclerotic calcifications of the normal caliber thoracic aorta. Normal branching of the aortic arch. Mediastinum/Nodes: No enlarged mediastinal, hilar or axillary lymph nodes. Thyroid gland and thoracic inlet are unremarkable. No acute abnormality of the trachea side from posterior bowing which is seen with imaging during exhalation. There is  a large hiatal hernia. Lungs/Pleura: Mosaic attenuation of the lungs can be seen in the setting of imaging during exhalation. There are diminished lung volumes with basilar areas of bandlike opacity likely reflecting atelectasis and/or scarring. No consolidation, features of edema, pneumothorax, or effusion. No suspicious pulmonary nodules or masses. Upper Abdomen: Patient is post cholecystectomy. There is a calcified 1.1 cm splenic artery aneurysm which is unchanged since 2016. No acute abnormalities present in the visualized portions of the upper abdomen. Scattered colonic diverticula without focal pericolonic inflammation to suggest diverticulitis. Musculoskeletal: Stable compression deformity and retropulsion of the T12 vertebral body no acute or suspicious osseous lesions. No concerning chest wall lesions. Review of the MIP images confirms the above findings. IMPRESSION: 1. Extensive bilateral pulmonary emboli in the distal central, lobar and segmental arteries throughout both lungs with evidence of right heart strain. Positive for acute PE with CT evidence of right heart strain (RV/LV Ratio = 1.33) consistent with at least submassive (intermediate risk) PE. The presence of right heart strain has been associated with an increased risk of morbidity and mortality. Please activate Code PE by paging [phone]. 2. Large hiatal hernia. 3. Aortic Atherosclerosis (ICD10-I70.0). 4. 1.1 cm splenic artery aneurysm. Finding is unchanged since 2016 and has a low overall risk of rupture. Could consider surveillance imaging on an annual basis. Recommendation is based on the consensus guidelines: Managing incidental findings on abdominal and pelvic CT and MRI, Part 2: white paper of the ACR Incidental Findings Committee II on vascular findings. J Am Coll Radiol. 2013;10 (10): 789-94. doi:10.1016/j.jacr.2013.05.021 Critical Value/emergent results were called by telephone at the time of interpretation on 08/01/2019 at 7:17 pm  to providerDr Pilar Plate, who verbally acknowledged these results. Electronically Signed   By: Kreg Shropshire M.D.   On: 08/01/2019 19:19   Dg Chest Port 1 View  Result Date: 08/01/2019 CLINICAL DATA:  78 year old female with history of shortness of breath and generalized weakness. EXAM: PORTABLE CHEST 1 VIEW COMPARISON:  Chest x-ray 02/17/2013. FINDINGS: Lung volumes are normal. No consolidative airspace disease. No pleural effusions. No evidence of pulmonary edema. No pneumothorax. No definite suspicious appearing pulmonary nodules or masses are noted. Moderate cardiomegaly. Upper mediastinal contours are within normal limits. Aortic atherosclerosis. Large hiatal hernia. IMPRESSION: 1. No radiographic evidence of acute cardiopulmonary disease. 2. Moderate cardiomegaly. 3. Aortic atherosclerosis. 4. Large hiatal hernia. Electronically Signed   By: Trudie Reed M.D.   On: 08/01/2019 16:48   Vas [place] Lower Extremity Venous (dvt) (mc And Wl 7a-7p)  Result Date: 08/01/2019  Lower Venous Study Indications: Swelling, and Pain.  Comparison Study: no prior Performing Technologist: Blanch Media RVS  Examination Guidelines: A complete evaluation includes B-mode imaging, spectral Doppler, color Doppler, and power Doppler as needed of all accessible portions of each  vessel. Bilateral testing is considered an integral part of a complete examination. Limited examinations for reoccurring indications may be performed as noted.  +---------+---------------+---------+-----------+----------+--------------+ LEFT     CompressibilityPhasicitySpontaneityPropertiesThrombus Aging +---------+---------------+---------+-----------+----------+--------------+ CFV      None           No       No                   Acute          +---------+---------------+---------+-----------+----------+--------------+ SFJ      None                                         Acute           +---------+---------------+---------+-----------+----------+--------------+ FV Prox  None                                         Acute          +---------+---------------+---------+-----------+----------+--------------+ FV Mid   None                                         Acute          +---------+---------------+---------+-----------+----------+--------------+ FV DistalNone                                         Acute          +---------+---------------+---------+-----------+----------+--------------+ PFV      None                                         Acute          +---------+---------------+---------+-----------+----------+--------------+ POP      None           No       No                   Acute          +---------+---------------+---------+-----------+----------+--------------+ PTV      None                                         Acute          +---------+---------------+---------+-----------+----------+--------------+ PERO                                                  Not visualized +---------+---------------+---------+-----------+----------+--------------+     Summary: Left: Findings consistent with acute deep vein thrombosis involving the left common femoral vein, left femoral vein, left proximal profunda vein, left popliteal vein, and left posterior tibial veins. No cystic structure found in the popliteal fossa.  *See table(s) above for measurements and observations.    Preliminary         Scheduled Meds: . Chlorhexidine Gluconate Cloth  6 each Topical Daily  . mouth rinse  15 mL Mouth Rinse BID  . pantoprazole (PROTONIX) IV  40 mg Intravenous Q12H  . senna-docusate  1 tablet Oral BID  . sodium chloride flush  3 mL Intravenous Q12H   Continuous Infusions: . heparin 1,100 Units/hr (08/01/19 2040)     LOS: 1 day    Time spent: 40 minutes    Ramiro Harvest, MD [facility]  If 7PM-7AM, please contact  night-coverage [email or web address] 08/02/2019, 3:09 PM

## 2019-08-02 NOTE — Progress Notes (Signed)
[place] for IV heparin Indication: DVT, possible PE  Allergies  Allergen Reactions  . Penicillins Rash and Other (See Comments)    Has patient had a PCN reaction causing immediate rash, facial/tongue/throat swelling, SOB or lightheadedness with hypotension: No Has patient had a PCN reaction causing severe rash involving mucus membranes or skin necrosis: No Has patient had a PCN reaction that required hospitalization: No Has patient had a PCN reaction occurring within the last 10 years: No If all of the above answers are "NO", then may proceed with Cephalosporin use.   . Ace Inhibitors Cough  . Dexilant [Dexlansoprazole] Other (See Comments)    syncope  . Oxycodone Nausea Only  . Sulfa Antibiotics Swelling    Patient Measurements: Height: 4\' 11"  (149.9 cm) Weight: 185 lb 6.5 oz (84.1 kg) IBW/kg (Calculated) : 43.2 Heparin Dosing Weight: 63 kg  Vital Signs: Temp: 97.6 F (36.4 C) (10/29 0324) Temp Source: Oral (10/29 0324) BP: 142/80 (10/29 0400) Pulse Rate: 84 (10/29 0400)  Labs: Recent Labs    08/01/19 1607 08/01/19 1808 08/02/19 0521  HGB 11.5*  --  11.0*  HCT 39.1  --  37.8  PLT 306  --  279  APTT 29  --   --   LABPROT 14.1  --   --   INR 1.1  --   --   HEPARINUNFRC  --   --  0.64  CREATININE 1.08*  --  1.00  TROPONINIHS 81* 73*  --     Estimated Creatinine Clearance: 43.6 mL/min (by C-G formula based on SCr of 1 mg/dL).   Medical History: Past Medical History:  Diagnosis Date  . Arthritis   . Dysphagia   . GERD (gastroesophageal reflux disease)   . History of hiatal hernia   . History of kidney stones    x2 ; passed independently  . Hyperlipidemia   . Hypertension   . Nausea     Medications:  Medications Prior to Admission  Medication Sig Dispense Refill Last Dose  . Biotin 1000 MCG tablet Take 1,000 mcg by mouth daily.     . Cholecalciferol (VITAMIN D3) 125 MCG (5000 UT) TABS Take 5,000 Units by mouth  daily.   07/31/2019 at Unknown time  . ciprofloxacin (CIPRO) 500 MG tablet Take 500 mg by mouth 2 (two) times daily.   07/31/2019 at Unknown time  . Iron-Vitamin C (VITRON-C) 65-125 MG TABS Take 1 tablet by mouth daily.   07/31/2019 at Unknown time  . pantoprazole (PROTONIX) 40 MG tablet Take 1 tablet (40 mg total) by mouth 2 (two) times daily before a meal. 60 tablet 0   . sucralfate (CARAFATE) 1 g tablet Take 1 g by mouth 2 (two) times daily.   07/31/2019 at Unknown time  . traMADol (ULTRAM) 50 MG tablet Take 50 mg by mouth every 6 (six) hours as needed for moderate pain.   07/31/2019 at Unknown time  . ferrous sulfate 325 (65 FE) MG EC tablet Take 1 tablet (325 mg total) by mouth 2 (two) times daily with a meal. (Patient not taking: Reported on 08/01/2019) 60 tablet 0 Not Taking at Unknown time   Scheduled:  . Chlorhexidine Gluconate Cloth  6 each Topical Daily  . mouth rinse  15 mL Mouth Rinse BID  . pantoprazole (PROTONIX) IV  40 mg Intravenous Q12H  . sodium chloride flush  3 mL Intravenous Q12H   Assessment: 60 yoF with PMH HTN, HLD, GERD, recent admission for  GIB 2 wks ago requiring transfusion, presents with weakness, worsening SOB x 2-3 days, and LLE swelling. D-dimer markedly elevated and extensive LLE DVT found on [place]; strong suspicion for PE. Pharmacy consulted to dose IV heparin infusion.  Discussed concerns over recent GIB with EDP. GIB determined to be d/t multiple gastric ulcers; Hgb resolved after PRBC transfusion x 2 and remains stable. Patient reports no further hematochezia. However, EDP OK to dose more conservatively pending results of CTA (or if patient decompensates)   Baseline INR, aPTT: WNL  Prior anticoagulation: none  Significant events:  10/28  CBC: Hgb slightly low but stable, improved from prior admission; Plt stable WNL  No bleeding or infusion issues per nursing Today, 10/29  0521 HL = 0.64 at goal, no bleeding or infusion issues per RN.  H/H =  11/37.8, plts = 279  Goal of Therapy: Heparin level 0.3-0.7 units/ml Monitor platelets by anticoagulation protocol: Yes  Plan: Continue heparin drip at 1100 units/hr  Recheck HL later today to ensure stays in therapeutic range Daily HL/CBC  Thanks Lorenza Evangelist 08/02/2019 6:04 AM

## 2019-08-02 NOTE — Consult Note (Addendum)
   NAME:  Cheryl Goodman, MRN:  [id number], DOB:  Mar 17, 1941, LOS: 1 ADMISSION DATE:  08/01/2019, CONSULTATION DATE:  08/02/19 REFERRING MD:  Grandville Silos, CHIEF COMPLAINT:  SOB   Brief History   78 year old woman with hx of recent GIB presenting with submassive PE.  History of present illness   78 year old woman w/ recent hx of GIB from erosive gastritis (10/12-10/14, needed 2 units pRBCs), HTN, HLD who presents with left leg pain and SOB.  Imaging shows femoral vein clot and submassive PE.  Patient has been started on heparin drip.  PCCM consulted to assist with management.  Currently leg pain better as is breathing.  HD stable, minimal O2 requirement.  Constipated x 2 days.  Admits to being sedentary since recent admission.  Mammogram about 2 years ago.  Colonoscopy not up to date.  ROS Positive Symptoms in bold:  Constitutional fevers, chills, weight loss, fatigue, anorexia, malaise  Eyes decreased vision, double vision, eye irritation  Ears, Nose, Mouth, Throat sore throat, trouble swallowing, sinus congestion  Cardiovascular chest pain, paroxysmal nocturnal dyspnea, lower ext edema, palpitations   Respiratory SOB, cough, DOE, hemoptysis, wheezing  Gastrointestinal nausea, vomiting, diarrhea  Genitourinary burning with urination, trouble urinating  Musculoskeletal joint aches, joint swelling, back pain  Integumentary  rashes, skin lesions  Neurological focal weakness, focal numbness, trouble speaking, headaches  Psychiatric depression, anxiety, confusion  Endocrine polyuria, polydipsia, cold intolerance, heat intolerance  Hematologic abnormal bruising, abnormal bleeding, unexplained nose bleeds  Allergic/Immunologic recurrent infections, hives, swollen lymph nodes     Past Medical History  HTN HLD GERD Arthritis  Significant Hospital Events   10/28 Admitted  Consults:  PCCM  Procedures:  None  Significant Diagnostic Tests:  OSH CTA: Bilateral PE with CT signs of RV  strain Trop 81 BNP 1k  Micro Data:  None  Antimicrobials:  None   Interim history/subjective:  Consulted  Objective   Blood pressure 128/85, pulse 86, temperature 98.9 F (37.2 C), temperature source Axillary, resp. rate (!) 21, height 4\' 11"  (1.499 m), weight 84.1 kg, SpO2 96 %.        Intake/Output Summary (Last 24 hours) at 08/02/2019 1011 Last data filed at 08/02/2019 2297 Gross per 24 hour  Intake 47 ml  Output 150 ml  Net -103 ml   Filed Weights   08/01/19 1527  Weight: 84.1 kg    Examination: GEN: elderly woman in NAD HEENT: MMM, no thrush CV: RRR, ext warm PULM: Clear, no accessory muscle use GI: Soft, hypoactive BS EXT: Trace L edema with more prominent veins, arthritic changes of hands NEURO: Moves all 4 ext to command PSYCH: AOx3, good insight SKIN: No rashes   Resolved Hospital Problem list   NA  Assessment & Plan:  # Submassive PE- recent GIB, age and cardiopulmonary status argue for [facility] alone and no advanced interventions.  Should she not tolerate AC an IVC filter can be considered. # Left femoral clot # Recent UGIB- H/H stable here  - Monitor H/H, continue PPI BID indefinitely for now - Continue heparin gtt, if H/H stable x 48h would transition to NoAC - Bedrest x 1 day then fine for chair - Add bowel regimen - IS, wean O2 - At least 3-6 months AC, would call this provoked, further [facility] depends on risk/benefit discussion - Will follow with you, call if any questions/concerns  Erskine Emery MD PCCM

## 2019-08-02 NOTE — Progress Notes (Signed)
COVID results were negative.  Spoke with pt regarding use of nocturnal cpap, but she declined for tonight.  RN aware.  Pt was encouraged to call should she change her mind.  RT will follow up nightly and assist pt with cpap if desired.

## 2019-08-02 NOTE — Progress Notes (Signed)
ANTICOAGULATION CONSULT NOTE  Pharmacy Consult for IV heparin Indication: DVT, possible PE  Allergies  Allergen Reactions  . Penicillins Rash and Other (See Comments)    Has patient had a PCN reaction causing immediate rash, facial/tongue/throat swelling, SOB or lightheadedness with hypotension: No Has patient had a PCN reaction causing severe rash involving mucus membranes or skin necrosis: No Has patient had a PCN reaction that required hospitalization: No Has patient had a PCN reaction occurring within the last 10 years: No If all of the above answers are "NO", then may proceed with Cephalosporin use.   . Ace Inhibitors Cough  . Dexilant [Dexlansoprazole] Other (See Comments)    syncope  . Oxycodone Nausea Only  . Sulfa Antibiotics Swelling    Patient Measurements: Height: 4\' 11"  (149.9 cm) Weight: 185 lb 6.5 oz (84.1 kg) IBW/kg (Calculated) : 43.2 Heparin Dosing Weight: 63 kg  Vital Signs: Temp: 98 F (36.7 C) (10/29 1151) Temp Source: Oral (10/29 1151) BP: 128/85 (10/29 0800) Pulse Rate: 86 (10/29 0800)  Labs: Recent Labs    08/01/19 1607 08/01/19 1808 08/02/19 0521 08/02/19 1257  HGB 11.5*  --  11.0*  --   HCT 39.1  --  37.8  --   PLT 306  --  279  --   APTT 29  --   --   --   LABPROT 14.1  --   --   --   INR 1.1  --   --   --   HEPARINUNFRC  --   --  0.64 0.41  CREATININE 1.08*  --  1.00  --   TROPONINIHS 81* 73*  --   --     Estimated Creatinine Clearance: 43.6 mL/min (by C-G formula based on SCr of 1 mg/dL).   Medical History: Past Medical History:  Diagnosis Date  . Arthritis   . Dysphagia   . GERD (gastroesophageal reflux disease)   . History of hiatal hernia   . History of kidney stones    x2 ; passed independently  . Hyperlipidemia   . Hypertension   . Nausea     Medications:  Medications Prior to Admission  Medication Sig Dispense Refill Last Dose  . Biotin 1000 MCG tablet Take 1,000 mcg by mouth daily.     . Cholecalciferol  (VITAMIN D3) 125 MCG (5000 UT) TABS Take 5,000 Units by mouth daily.   07/31/2019 at Unknown time  . ciprofloxacin (CIPRO) 500 MG tablet Take 500 mg by mouth 2 (two) times daily.   07/31/2019 at Unknown time  . Iron-Vitamin C (VITRON-C) 65-125 MG TABS Take 1 tablet by mouth daily.   07/31/2019 at Unknown time  . pantoprazole (PROTONIX) 40 MG tablet Take 1 tablet (40 mg total) by mouth 2 (two) times daily before a meal. 60 tablet 0   . sucralfate (CARAFATE) 1 g tablet Take 1 g by mouth 2 (two) times daily.   07/31/2019 at Unknown time  . traMADol (ULTRAM) 50 MG tablet Take 50 mg by mouth every 6 (six) hours as needed for moderate pain.   07/31/2019 at Unknown time  . ferrous sulfate 325 (65 FE) MG EC tablet Take 1 tablet (325 mg total) by mouth 2 (two) times daily with a meal. (Patient not taking: Reported on 08/01/2019) 60 tablet 0 Not Taking at Unknown time   Scheduled:  . Chlorhexidine Gluconate Cloth  6 each Topical Daily  . mouth rinse  15 mL Mouth Rinse BID  . pantoprazole (PROTONIX) IV  40 mg Intravenous Q12H  . senna-docusate  1 tablet Oral BID  . sodium chloride flush  3 mL Intravenous Q12H   Assessment: 59 yoF with PMH HTN, HLD, GERD, recent admission for GIB 2 wks ago requiring transfusion, presents with weakness, worsening SOB x 2-3 days, and LLE swelling. D-dimer markedly elevated and extensive LLE DVT found on [place]; sCT + bilateral PE. Pharmacy consulted to dose IV heparin infusion.  Discussed concerns over recent GIB with EDP. GIB determined to be d/t multiple gastric ulcers; Hgb resolved after PRBC transfusion x 2 and remains stable. Patient reports no further hematochezia.   Baseline INR, aPTT: WNL  Prior anticoagulation: none  Significant events:  08/02/2019:  Heparin level therapeutic (0.41) on heparin infusion at 1100 units/hr  CBC: Hgb slightly low but stable, improved from prior admission; Plt stable WNL  No bleeding or infusion issues per nursing  Goal of  Therapy: Heparin level 0.3-0.7 units/ml Monitor platelets by anticoagulation protocol: Yes  Plan:  Continue heparin drip at 1100 units/hr   Daily HL/CBC while on heparin  F/U plans for long-term anticoagulation  Netta Cedars, PharmD, BCPS 08/02/2019 @ 1:31 PM

## 2019-08-02 NOTE — Progress Notes (Signed)
Pt asked about results from her urinalysis and her echocardiogram.  Randol Kern MD paged and made aware of urinalysis that showed large amounts of leukocytes.  Orders received.  Pt aware that physician will review echo results with her in the morning.

## 2019-08-02 NOTE — Progress Notes (Signed)
Cross cover note: called to review U/A from this AM.  Pt febrile to 99.1. WBC 13.9. U/A with large LE, few bacteria, 21-50 WBC/hpf  Plan - add cipro 500 mb bid x 3 days.  Randol Kern, MD [facility] [phone]

## 2019-08-02 NOTE — Progress Notes (Signed)
Echocardiogram 2D Echocardiogram has been performed.  Oneal Deputy Viktorya Arguijo 08/02/2019, 12:16 PM

## 2019-08-02 NOTE — Progress Notes (Signed)
Pt declined nocturnal cpap tonight.  Pt was encouraged to call should she change her mind.  RN aware.

## 2019-08-03 DIAGNOSIS — B9689 Other specified bacterial agents as the cause of diseases classified elsewhere: Secondary | ICD-10-CM | POA: Diagnosis present

## 2019-08-03 DIAGNOSIS — K5900 Constipation, unspecified: Secondary | ICD-10-CM | POA: Diagnosis present

## 2019-08-03 DIAGNOSIS — N390 Urinary tract infection, site not specified: Secondary | ICD-10-CM | POA: Diagnosis present

## 2019-08-03 DIAGNOSIS — I2699 Other pulmonary embolism without acute cor pulmonale: Secondary | ICD-10-CM | POA: Diagnosis not present

## 2019-08-03 LAB — BASIC METABOLIC PANEL
Anion gap: 9 (ref 5–15)
BUN: 21 mg/dL (ref 8–23)
CO2: 22 mmol/L (ref 22–32)
Calcium: 8.6 mg/dL — ABNORMAL LOW (ref 8.9–10.3)
Chloride: 107 mmol/L (ref 98–111)
Creatinine, Ser: 0.99 mg/dL (ref 0.44–1.00)
GFR calc Af Amer: >60 mL/min (ref >60)
GFR calc non Af Amer: 55 mL/min — ABNORMAL LOW (ref >60)
Glucose, Bld: 109 mg/dL — ABNORMAL HIGH (ref 70–99)
Potassium: 3.9 mmol/L (ref 3.5–5.1)
Sodium: 138 mmol/L (ref 135–145)

## 2019-08-03 LAB — URINE CULTURE: Culture: NO GROWTH

## 2019-08-03 LAB — CBC
HCT: 35.1 % — ABNORMAL LOW (ref 36.0–46.0)
Hemoglobin: 10.3 g/dL — ABNORMAL LOW (ref 12.0–15.0)
MCH: 25.1 pg — ABNORMAL LOW (ref 26.0–34.0)
MCHC: 29.3 g/dL — ABNORMAL LOW (ref 30.0–36.0)
MCV: 85.6 fL (ref 80.0–100.0)
Platelets: 315 10*3/uL (ref 150–400)
RBC: 4.1 MIL/uL (ref 3.87–5.11)
RDW: 22.1 % — ABNORMAL HIGH (ref 11.5–15.5)
WBC: 14.8 10*3/uL — ABNORMAL HIGH (ref 4.0–10.5)
nRBC: 0 % (ref 0.0–0.2)

## 2019-08-03 LAB — HEPARIN LEVEL (UNFRACTIONATED): Heparin Unfractionated: 0.59 IU/mL (ref 0.30–0.70)

## 2019-08-03 MED ORDER — BISACODYL 10 MG RE SUPP: 10.0000 mg | Freq: Once | RECTAL | Status: DC

## 2019-08-03 MED ORDER — SODIUM CHLORIDE 0.9 % IV SOLN
1.0000 g | INTRAVENOUS | Status: AC
  Administered 2019-08-03 – 2019-08-05 (×3): 1 g via INTRAVENOUS
  Filled 2019-08-03 (×3): qty 1

## 2019-08-03 MED ORDER — POLYETHYLENE GLYCOL 3350 17 G PO PACK
17.0000 g | PACK | Freq: Every day | ORAL | Status: DC
  Administered 2019-08-05 – 2019-08-06 (×2): 17 g via ORAL
  Filled 2019-08-03 (×4): qty 1

## 2019-08-03 MED ORDER — ONDANSETRON HCL 4 MG/2ML IJ SOLN
4.0000 mg | Freq: Four times a day (QID) | INTRAMUSCULAR | Status: DC | PRN
  Administered 2019-08-03: 4 mg via INTRAVENOUS
  Filled 2019-08-03: qty 2

## 2019-08-03 NOTE — Progress Notes (Signed)
ANTICOAGULATION CONSULT NOTE - Follow Up Consult  Pharmacy Consult for heparin Indication: acute pulmonary embolus and DVT  Allergies  Allergen Reactions  . Penicillins Rash and Other (See Comments)    Has patient had a PCN reaction causing immediate rash, facial/tongue/throat swelling, SOB or lightheadedness with hypotension: No Has patient had a PCN reaction causing severe rash involving mucus membranes or skin necrosis: No Has patient had a PCN reaction that required hospitalization: No Has patient had a PCN reaction occurring within the last 10 years: No If all of the above answers are "NO", then may proceed with Cephalosporin use.   . Ace Inhibitors Cough  . Dexilant [Dexlansoprazole] Other (See Comments)    syncope  . Oxycodone Nausea Only  . Sulfa Antibiotics Swelling    Patient Measurements: Height: 4\' 11"  (149.9 cm) Weight: 178 lb 9.2 oz (81 kg) IBW/kg (Calculated) : 43.2 Heparin Dosing Weight: 63 kg  Vital Signs: Temp: 98.5 F (36.9 C) (10/30 0800) Temp Source: Oral (10/30 0800) BP: 145/74 (10/30 0500) Pulse Rate: 72 (10/30 0600)  Labs: Recent Labs    08/01/19 1607 08/01/19 1808 08/02/19 0521 08/02/19 1257 08/03/19 0533  HGB 11.5*  --  11.0*  --  10.3*  HCT 39.1  --  37.8  --  35.1*  PLT 306  --  279  --  315  APTT 29  --   --   --   --   LABPROT 14.1  --   --   --   --   INR 1.1  --   --   --   --   HEPARINUNFRC  --   --  0.64 0.41 0.59  CREATININE 1.08*  --  1.00  --  0.99  TROPONINIHS 81* 73*  --   --   --     Estimated Creatinine Clearance: 43.1 mL/min (by C-G formula based on SCr of 0.99 mg/dL).   Assessment: Patient's a 78 y.o F recently hospitalized (07/18/19) for GIB (EGD on 10/13 showed esophagitis, large hiatal hernia and non-bleeding erosive gastropathy , presented to the ED on 10/28 with c/o generalized weakness.  Chest CTA on 10/28 showed extensive bilateral PE with evidence of right heart strain and LE doppler was positive for left LE  DVT. Patient's currently on heparin drip for acute DVT/PE.  Today, 08/03/2019: - heparin level remains therapeutic at 0.59 - hgb down slightly to 10.3, plts relatively stable - no bleeding documented  Goal of Therapy:  Heparin level 0.3-0.7 units/ml Monitor platelets by anticoagulation protocol: Yes   Plan:  - continue heparin drip at 1100 units/hr - daily heparin level - monitor for s/s bleeding  Vilma Will P 08/03/2019,10:23 AM

## 2019-08-03 NOTE — Progress Notes (Signed)
   NAME:  Cheryl Goodman, MRN:  [id number], DOB:  1941-07-25, LOS: 2 ADMISSION DATE:  08/01/2019, CONSULTATION DATE:  08/02/19 REFERRING MD:  Grandville Silos, CHIEF COMPLAINT:  SOB   Brief History   78 year old woman with hx of recent GIB presenting with submassive PE.  History of present illness   78 year old woman w/ recent hx of GIB from erosive gastritis (10/12-10/14, needed 2 units pRBCs), HTN, HLD who presents with left leg pain and SOB.  Imaging shows femoral vein clot and submassive PE.  Patient has been started on heparin drip.  PCCM consulted to assist with management.  Currently leg pain better as is breathing.  HD stable, minimal O2 requirement.  Constipated x 2 days.  Admits to being sedentary since recent admission.  Mammogram about 2 years ago.  Colonoscopy not up to date.    Past Medical History  HTN HLD GERD Arthritis  Significant Hospital Events   10/28 Admitted  Consults:  PCCM  Procedures:  None  Significant Diagnostic Tests:  OSH CTA: Bilateral PE with CT signs of RV strain Trop 81 BNP 1k  Micro Data:  None  Antimicrobials:  None   Interim history/subjective:  No events Feels weak Denies SOB, dizziness, or hemoptysis Echo results noted  Objective   Blood pressure (!) 145/74, pulse 72, temperature 99.1 F (37.3 C), temperature source Oral, resp. rate (!) 25, height 4\' 11"  (1.499 m), weight 81 kg, SpO2 96 %.        Intake/Output Summary (Last 24 hours) at 08/03/2019 0816 Last data filed at 08/03/2019 0700 Gross per 24 hour  Intake 361 ml  Output 200 ml  Net 161 ml   Filed Weights   08/01/19 1527 08/03/19 0600  Weight: 84.1 kg 81 kg    Examination: GEN: elderly woman in NAD HEENT: MMM, no thrush CV: RRR, ext warm PULM: Clear, no accessory muscle use GI: Soft, hypoactive BS EXT: Trace L edema with more prominent veins, arthritic changes of hands NEURO: Moves all 4 ext to command PSYCH: AOx3, good insight SKIN: No rashes  Resolved  Hospital Problem list   NA  Assessment & Plan:  # Submassive PE- recent significant GIB, age and cardiopulmonary status argue for [facility] alone and no advanced interventions.  Should she not tolerate AC an IVC filter can be considered. # Left femoral clot # Recent UGIB- H/H stable here # Cystitis- cipro started 10/29 # Constipation- suppository ordered for this AM # Muscular deconditioning- related to recent hospitalization and sedentary lifestyle in interim  - Monitor H/H, continue PPI BID indefinitely for now - Continue heparin gtt today, switch to NoAC tomorrow - Up to chair, PT consult - Bowel regimen as ordered - IS, wean O2 - At least 3-6 months AC, would call this provoked, further [facility] depends on risk/benefit discussion - Will see tomorrow  Erskine Emery MD PCCM

## 2019-08-03 NOTE — Progress Notes (Signed)
PROGRESS NOTE    Cheryl Goodman  XLK:[id number] DOB: 09/10/1941 DOA: 08/01/2019 PCP: Danella Penton, MD    Brief Narrative:  HPI per Dr. Haskel Khan is a 78 y.o. female with medical history significant for erosive gastritis with recent upper GI bleeding, hypertension, hyperlipidemia, and OSA who presents to the ED for evaluation of dyspnea and left leg pain.  Patient was recently hospitalized at [facility] hospital from 07/16/2019-07/18/2019 for upper GI bleed due to erosive gastritis seen on EGD 07/17/2019.  She required transfusion of 2 units PRBCs and was discharged on oral PPI therapy..  Patient states that since discharge from the hospital she has been having some pain in her legs.  Over the last week she has developed dyspnea on exertion with progressive lethargy and weakness.  She denies any associated chest pain or palpitations.  She reports a chronic cough which she attributes to her reflux.  Cough is nonproductive and she denies any hemoptysis.  She denies any other recent obvious bleeding since discharge from the hospital including epistaxis, hematemesis, coffee-ground emesis, vaginal bleeding, hematuria, hematochezia, or melena.  ED Course:  Initial vitals showed BP 130/89, pulse 98, RR 16, temp 98.9 Fahrenheit, SPO2 94% on room air.  Labs notable for WBC 13.7, hemoglobin 11.5, platelets 306,000, sodium 139, potassium 3.8, BUN 20, creatinine 1.08, lipase 28, BNP 1062.l5, high-sensitivity troponin I 81 then 73, D-dimer 14.29.  SARS-CoV-2 test was obtained and pending.  Venous ultrasound left lower extremity preliminary findings showed acute DVT of the left common femoral vein, left femoral vein, left proximal profunda vein, left popliteal vein, and left posterior tibial veins.  CTA chest PE studies showed extensive bilateral pulmonary emboli in the distal central, lobar and segmental arteries throughout both lungs with CT evidence of right heart strain suggestive  of submassive PE.  A 1.1 cm splenic artery aneurysm was seen, unchanged from 2016 study.  EDP discussed the case with PCCM who recommended hospitalist admission and discussion with IR for IVC filter placement.  EDP discussed with IR who are aware of case and holding off on IVC filter placement as patient currently on anticoagulation.  The hospitalist service was consulted admit for further evaluation and management.  Assessment & Plan:   Principal Problem:   Bilateral pulmonary embolism (HCC) Active Problems:   Gastroesophageal reflux disease with esophagitis without hemorrhage   Acute deep vein thrombosis (DVT) of left lower extremity (HCC)   Erosive gastritis   Elevated troponin   Acute lower UTI   Constipation   1 submassive bilateral pulmonary emboli/extensive left lower extremity DVT(left common femoral vein, left femoral vein, left proximal profunda vein, left popliteal vein, left posterior tibial veins.) Questionable etiology.  Patient presented with worsening shortness of breath, left lower extremity pain and swelling.  Left lower extremity Dopplers done consistent with extensive left lower extremity DVT (left common femoral vein, left femoral vein, left proximal profunda vein, left popliteal vein, left posterior tibial veins).  CT angiogram of chest consistent with extensive bilateral pulmonary emboli in the distal central, lobar and segmental arteries throughout both lungs with CT evidence of right heart strain suggestive of submassive PE). Patient noted to have recently been hospitalized at [facility] from 07/16/2019 to 07/18/2019 for upper GI bleed secondary to erosive gastritis seen on EGD of 07/17/2019.  Patient on PPI.  Patient denies any overt bleeding since being started on IV heparin.  2D echo with severely dilated right ventricle with severely reduced right ventricular function.  Septal flattening  consistent with right ventricular pressure overload.  Right ventricular apical  hypokinesis consistent with McConnell sign which is consistent with acute cor pulmonale in the setting of submassive PE.  Left ventricular EF 60 to 65%.  Borderline left ventricular hypertrophy.  Global right ventricle has severely reduced systolic function.  Right atrial size severely dilated.  Presence of pericardial fat pad. Patient with a elevated BNP and elevated troponin likely due to right heart strain.  Continue IV heparin.  IR was consulted about emergent IVC filter placement however.  IR note critical care fellow patient hemodynamically stable and not considering thrombolytic therapy at this time.  It is felt that patient may need a filter if she starts to have recurrent GI bleed.  Patient with hemodynamic stability at this point in time.  Due to patient's increased propensity to decompensate critical care medicine was consulted and are following.  Per critical care due to patient's recent significant GI bleed, age and cardiopulmonary status are recommending anticoagulation alone with no advanced interventions.  If patient unable to tolerate anticoagulation and IVC filter could be considered.  Continue heparin drip today and per critical care medicine could likely switch to a NOAC tomorrow.  Up in chair.  PT/OT ordered per pulmonary.  Pulmonary recommending at least 3 to 6 months of anticoagulation as would likely call this a provoked incidence.  Appreciate PCCM's input and recommendations.   2.  Erosive gastritis with recent upper GI bleed Patient admitted at [facility] from 07/16/2019 to 07/18/2019.  Hemoglobin currently stable.  Patient denies any overt bleeding.  Continue IV PPI twice daily for now for upper GI protection.  H. pylori serology was done and [facility] and pending at the time of discharge.  Monitor closely for bleeding as patient on IV heparin.  3.  Elevated troponin Likely secondary to demand ischemia secondary to problem #1 from probable right ventricular strain.  Troponin was 81 and trended  down to 73.  Patient with no overt chest pain.  2D echo with severely dilated right ventricle with severely reduced right ventricular function.  Septal flattening consistent with right ventricular pressure overload.  Right ventricular apical hypokinesis consistent with McConnell sign which is consistent with acute cor pulmonale in the setting of submassive PE.  Left ventricular EF 60 to 65%.  Borderline left ventricular hypertrophy.  Global right ventricle has severely reduced systolic function.  Right atrial size severely dilated.  Presence of pericardial fat pad.  Continue treatment as in problem #1.  Follow.  4.  OSA CPAP nightly.  5.  UTI Patient with complaints of dysuria yesterday and today.  Urinalysis on admission with large leukocytes, nitrite negative, 21-50 WBCs.  Urine cultures pending.  Patient was placed on ciprofloxacin last night which we will discontinue.  Placed on IV Rocephin pending urine culture results as patient symptomatic.  6.  Constipation Currently on Senokot-S twice daily.  Placed on MiraLAX daily.  DVT prophylaxis: Full dose heparin Code Status: Full Family Communication: Updated patient.  No family at bedside. Disposition Plan: Remain in stepdown unit.   Consultants:   PCCM: Dr. Katrinka Blazing 08/02/2019  Interventional radiology: Dr. Lowella Dandy 08/01/2019  Procedures:   2D echo 08/02/2019  CT angiogram chest 08/01/2019  Chest x-ray 08/01/2019  Lower extremity Doppler left leg 08/01/2019  Antimicrobials:  IV Rocephin 08/03/2019  Oral ciprofloxacin 10/29/2020x1 dose   Subjective: Patient lying in bed.  Feels shortness of breath slowly improving daily.  Patient however has not ambulated.  Patient denies any chest pain.  Patient  with complaints of dysuria.  Denies any melanotic stools.  No hematemesis.  Complains of constipation.   Objective: Vitals:   08/03/19 0400 08/03/19 0500 08/03/19 0600 08/03/19 0800  BP: 135/63 (!) 145/74    Pulse: 78 78 72   Resp:  (!) 24 (!) 26 (!) 25   Temp:    98.5 F (36.9 C)  TempSrc:    Oral  SpO2: 95% 95% 96%   Weight:   81 kg   Height:        Intake/Output Summary (Last 24 hours) at 08/03/2019 0951 Last data filed at 08/03/2019 0700 Gross per 24 hour  Intake 358 ml  Output 200 ml  Net 158 ml   Filed Weights   08/01/19 1527 08/03/19 0600  Weight: 84.1 kg 81 kg    Examination:  General exam: Appears calm and comfortable  Respiratory system: Lungs clear to auscultation bilaterally.  Some decreased breath sounds in the bases.  No wheezing.  Speaking in full sentences.  Normal respiratory effort.   Cardiovascular system: Regular rate rhythm no murmurs rubs or gallops.  No JVD.  Trace to 1+ bilateral lower extremity edema. Gastrointestinal system: Abdomen is soft, nontender, nondistended, positive bowel sounds.  No rebound.  No guarding.  Central nervous system: Alert and oriented. No focal neurological deficits. Extremities: Left lower extremity with some tenderness to palpation.  Trace to 1+ bilateral lower extremity edema. Skin: No rashes, lesions or ulcers Psychiatry: Judgement and insight appear normal. Mood & affect appropriate.     Data Reviewed: I have personally reviewed following labs and imaging studies  CBC: Recent Labs  Lab 08/01/19 1607 08/02/19 0521 08/03/19 0533  WBC 13.7* 13.9* 14.8*  NEUTROABS 10.8*  --   --   HGB 11.5* 11.0* 10.3*  HCT 39.1 37.8 35.1*  MCV 85.4 86.3 85.6  PLT 306 279 315   Basic Metabolic Panel: Recent Labs  Lab 08/01/19 1607 08/02/19 0521 08/03/19 0533  NA 139 138 138  K 3.8 3.7 3.9  CL 105 107 107  CO2 21* 19* 22  GLUCOSE 135* 139* 109*  BUN 20 21 21   CREATININE 1.08* 1.00 0.99  CALCIUM 9.1 8.9 8.6*   GFR: Estimated Creatinine Clearance: 43.1 mL/min (by C-G formula based on SCr of 0.99 mg/dL). Liver Function Tests: Recent Labs  Lab 08/01/19 1607  AST 20  ALT 21  ALKPHOS 66  BILITOT 0.7  PROT 6.8  ALBUMIN 3.4*   Recent Labs  Lab  08/01/19 1607  LIPASE 28   No results for input(s): AMMONIA in the last 168 hours. Coagulation Profile: Recent Labs  Lab 08/01/19 1607  INR 1.1   Cardiac Enzymes: No results for input(s): CKTOTAL, CKMB, CKMBINDEX, TROPONINI in the last 168 hours. BNP (last 3 results) No results for input(s): PROBNP in the last 8760 hours. HbA1C: No results for input(s): HGBA1C in the last 72 hours. CBG: No results for input(s): GLUCAP in the last 168 hours. Lipid Profile: No results for input(s): CHOL, HDL, LDLCALC, TRIG, CHOLHDL, LDLDIRECT in the last 72 hours. Thyroid Function Tests: No results for input(s): TSH, T4TOTAL, FREET4, T3FREE, THYROIDAB in the last 72 hours. Anemia Panel: No results for input(s): VITAMINB12, FOLATE, FERRITIN, TIBC, IRON, RETICCTPCT in the last 72 hours. Sepsis Labs: No results for input(s): PROCALCITON, LATICACIDVEN in the last 168 hours.  Recent Results (from the past 240 hour(s))  SARS CORONAVIRUS 2 (TAT 6-24 HRS) Nasopharyngeal Nasopharyngeal Swab     Status: None   Collection Time: 08/01/19  5:31 PM   Specimen: Nasopharyngeal Swab  Result Value Ref Range Status   SARS Coronavirus 2 NEGATIVE NEGATIVE Final    Comment: (NOTE) SARS-CoV-2 target nucleic acids are NOT DETECTED. The SARS-CoV-2 RNA is generally detectable in upper and lower respiratory specimens during the acute phase of infection. Negative results do not preclude SARS-CoV-2 infection, do not rule out co-infections with other pathogens, and should not be used as the sole basis for treatment or other patient management decisions. Negative results must be combined with clinical observations, patient history, and epidemiological information. The expected result is Negative. Fact Sheet for Patients: HairSlick.no Fact Sheet for Healthcare Providers: quierodirigir.com This test is not yet approved or cleared by the [place] FDA and  has  been authorized for detection and/or diagnosis of SARS-CoV-2 by FDA under an Emergency Use Authorization (EUA). This EUA will remain  in effect (meaning this test can be used) for the duration of the COVID-19 declaration under Section 56 4(b)(1) of the Act, 21 U.S.C. section 360bbb-3(b)(1), unless the authorization is terminated or revoked sooner. Performed at [facility], [address], [place], [place] [address]   MRSA PCR Screening     Status: None   Collection Time: 08/01/19 10:45 PM   Specimen: Nasal Mucosa; Nasopharyngeal  Result Value Ref Range Status   MRSA by PCR NEGATIVE NEGATIVE Final    Comment:        The GeneXpert MRSA Assay (FDA approved for NASAL specimens only), is one component of a comprehensive MRSA colonization surveillance program. It is not intended to diagnose MRSA infection nor to guide or monitor treatment for MRSA infections. Performed at [facility], [address] [place], [place] [address]          Radiology Studies: Ct Angio Chest Pe W/cm &/or Wo Cm  Result Date: 08/01/2019 CLINICAL DATA:  Shortness of breath, PE suspected EXAM: CT ANGIOGRAPHY CHEST WITH CONTRAST TECHNIQUE: Multidetector CT imaging of the chest was performed using the standard protocol during bolus administration of intravenous contrast. Multiplanar CT image reconstructions and MIPs were obtained to evaluate the vascular anatomy. CONTRAST:  OMNIPAQUE IOHEXOL 350 MG/ML SOLN COMPARISON:  CT 08/14/2015 FINDINGS: Cardiovascular: Satisfactory opacification of the pulmonary arteries to the segmental level. There are extensive bilateral pulmonary emboli in the distal central, lobar and segmental arteries throughout both lungs. There is associated right heart strain with flattening of the intraventricular septum and elevation of the RV/LV ratio (1.33). There is right heart enlargement and reflux of contrast into the hepatic veins. Atherosclerotic  calcifications of the normal caliber thoracic aorta. Normal branching of the aortic arch. Mediastinum/Nodes: No enlarged mediastinal, hilar or axillary lymph nodes. Thyroid gland and thoracic inlet are unremarkable. No acute abnormality of the trachea side from posterior bowing which is seen with imaging during exhalation. There is a large hiatal hernia. Lungs/Pleura: Mosaic attenuation of the lungs can be seen in the setting of imaging during exhalation. There are diminished lung volumes with basilar areas of bandlike opacity likely reflecting atelectasis and/or scarring. No consolidation, features of edema, pneumothorax, or effusion. No suspicious pulmonary nodules or masses. Upper Abdomen: Patient is post cholecystectomy. There is a calcified 1.1 cm splenic artery aneurysm which is unchanged since 2016. No acute abnormalities present in the visualized portions of the upper abdomen. Scattered colonic diverticula without focal pericolonic inflammation to suggest diverticulitis. Musculoskeletal: Stable compression deformity and retropulsion of the T12 vertebral body no acute or suspicious osseous lesions. No concerning chest wall  lesions. Review of the MIP images confirms the above findings. IMPRESSION: 1. Extensive bilateral pulmonary emboli in the distal central, lobar and segmental arteries throughout both lungs with evidence of right heart strain. Positive for acute PE with CT evidence of right heart strain (RV/LV Ratio = 1.33) consistent with at least submassive (intermediate risk) PE. The presence of right heart strain has been associated with an increased risk of morbidity and mortality. Please activate Code PE by paging [phone]. 2. Large hiatal hernia. 3. Aortic Atherosclerosis (ICD10-I70.0). 4. 1.1 cm splenic artery aneurysm. Finding is unchanged since 2016 and has a low overall risk of rupture. Could consider surveillance imaging on an annual basis. Recommendation is based on the consensus guidelines:  Managing incidental findings on abdominal and pelvic CT and MRI, Part 2: white paper of the ACR Incidental Findings Committee II on vascular findings. J Am Coll Radiol. 2013;10 (10): 144-81. doi:10.1016/j.jacr.2013.05.021 Critical Value/emergent results were called by telephone at the time of interpretation on 08/01/2019 at 7:17 pm to providerDr Sedonia Small, who verbally acknowledged these results. Electronically Signed   By: Lovena Le M.D.   On: 08/01/2019 19:19   Dg Chest Port 1 View  Result Date: 08/01/2019 CLINICAL DATA:  78 year old female with history of shortness of breath and generalized weakness. EXAM: PORTABLE CHEST 1 VIEW COMPARISON:  Chest x-ray 02/17/2013. FINDINGS: Lung volumes are normal. No consolidative airspace disease. No pleural effusions. No evidence of pulmonary edema. No pneumothorax. No definite suspicious appearing pulmonary nodules or masses are noted. Moderate cardiomegaly. Upper mediastinal contours are within normal limits. Aortic atherosclerosis. Large hiatal hernia. IMPRESSION: 1. No radiographic evidence of acute cardiopulmonary disease. 2. Moderate cardiomegaly. 3. Aortic atherosclerosis. 4. Large hiatal hernia. Electronically Signed   By: Vinnie Langton M.D.   On: 08/01/2019 16:48   Vas [place] Lower Extremity Venous (dvt) (mc And Wl 7a-7p)  Result Date: 08/02/2019  Lower Venous Study Indications: Swelling, and Pain.  Comparison Study: no prior Performing Technologist: Abram Sander RVS  Examination Guidelines: A complete evaluation includes B-mode imaging, spectral Doppler, color Doppler, and power Doppler as needed of all accessible portions of each vessel. Bilateral testing is considered an integral part of a complete examination. Limited examinations for reoccurring indications may be performed as noted.  +---------+---------------+---------+-----------+----------+--------------+ LEFT     CompressibilityPhasicitySpontaneityPropertiesThrombus Aging  +---------+---------------+---------+-----------+----------+--------------+ CFV      None           No       No                   Acute          +---------+---------------+---------+-----------+----------+--------------+ SFJ      None                                         Acute          +---------+---------------+---------+-----------+----------+--------------+ FV Prox  None                                         Acute          +---------+---------------+---------+-----------+----------+--------------+ FV Mid   None  Acute          +---------+---------------+---------+-----------+----------+--------------+ FV DistalNone                                         Acute          +---------+---------------+---------+-----------+----------+--------------+ PFV      None                                         Acute          +---------+---------------+---------+-----------+----------+--------------+ POP      None           No       No                   Acute          +---------+---------------+---------+-----------+----------+--------------+ PTV      None                                         Acute          +---------+---------------+---------+-----------+----------+--------------+ PERO                                                  Not visualized +---------+---------------+---------+-----------+----------+--------------+     Summary: Left: Findings consistent with acute deep vein thrombosis involving the left common femoral vein, left femoral vein, left proximal profunda vein, left popliteal vein, and left posterior tibial veins. No cystic structure found in the popliteal fossa.  *See table(s) above for measurements and observations. Electronically signed by Lemar LivingsBrandon Cain MD on 08/02/2019 at 4:31:32 PM.    Final         Scheduled Meds: . Chlorhexidine Gluconate Cloth  6 each Topical Daily  . mouth rinse  15  mL Mouth Rinse BID  . pantoprazole (PROTONIX) IV  40 mg Intravenous Q12H  . polyethylene glycol  17 g Oral Daily  . senna-docusate  1 tablet Oral BID  . sodium chloride flush  3 mL Intravenous Q12H   Continuous Infusions: . cefTRIAXone (ROCEPHIN)  IV    . heparin 1,100 Units/hr (08/01/19 2040)     LOS: 2 days    Time spent: 40 minutes    Ramiro Harvestaniel , MD [facility]  If 7PM-7AM, please contact night-coverage [email or web address] 08/03/2019, 9:51 AM

## 2019-08-03 NOTE — Progress Notes (Signed)
Pt ambulated in hall with RN assist. After 64ft, pt desat to 86% on 2L [place], pt sat in chair to rest. Once sat back up to 94%, pt continued back to room. Placed in chair, resting. Will continue to monitor.

## 2019-08-03 NOTE — Plan of Care (Addendum)
  Problem: Clinical Measurements: Goal: Will remain free from infection Outcome: Progressing Goal: Diagnostic test results will improve Outcome: Progressing   

## 2019-08-04 DIAGNOSIS — N390 Urinary tract infection, site not specified: Secondary | ICD-10-CM

## 2019-08-04 DIAGNOSIS — I2699 Other pulmonary embolism without acute cor pulmonale: Secondary | ICD-10-CM | POA: Diagnosis not present

## 2019-08-04 DIAGNOSIS — K2960 Other gastritis without bleeding: Secondary | ICD-10-CM

## 2019-08-04 LAB — BASIC METABOLIC PANEL
Anion gap: 9 (ref 5–15)
BUN: 23 mg/dL (ref 8–23)
CO2: 22 mmol/L (ref 22–32)
Calcium: 8.7 mg/dL — ABNORMAL LOW (ref 8.9–10.3)
Chloride: 107 mmol/L (ref 98–111)
Creatinine, Ser: 1.03 mg/dL — ABNORMAL HIGH (ref 0.44–1.00)
GFR calc Af Amer: >60 mL/min (ref >60)
GFR calc non Af Amer: 52 mL/min — ABNORMAL LOW (ref >60)
Glucose, Bld: 109 mg/dL — ABNORMAL HIGH (ref 70–99)
Potassium: 4.4 mmol/L (ref 3.5–5.1)
Sodium: 138 mmol/L (ref 135–145)

## 2019-08-04 LAB — CBC
HCT: 34.2 % — ABNORMAL LOW (ref 36.0–46.0)
Hemoglobin: 9.9 g/dL — ABNORMAL LOW (ref 12.0–15.0)
MCH: 25.2 pg — ABNORMAL LOW (ref 26.0–34.0)
MCHC: 28.9 g/dL — ABNORMAL LOW (ref 30.0–36.0)
MCV: 87 fL (ref 80.0–100.0)
Platelets: 310 10*3/uL (ref 150–400)
RBC: 3.93 MIL/uL (ref 3.87–5.11)
RDW: 21.4 % — ABNORMAL HIGH (ref 11.5–15.5)
WBC: 11.9 10*3/uL — ABNORMAL HIGH (ref 4.0–10.5)
nRBC: 0 % (ref 0.0–0.2)

## 2019-08-04 LAB — HEPARIN LEVEL (UNFRACTIONATED): Heparin Unfractionated: 0.54 IU/mL (ref 0.30–0.70)

## 2019-08-04 MED ORDER — APIXABAN 5 MG PO TABS
5.0000 mg | ORAL_TABLET | Freq: Two times a day (BID) | ORAL | Status: DC
  Administered 2019-08-04 – 2019-08-06 (×5): 5 mg via ORAL
  Filled 2019-08-04 (×5): qty 1

## 2019-08-04 NOTE — Discharge Instructions (Signed)
Information on my medicine - ELIQUIS (apixaban)  This medication education was reviewed with me or my healthcare representative as part of my discharge preparation.  The pharmacist that spoke with me during my hospital stay was:  Lynelle Doctor, [facility]  Why was Eliquis prescribed for you? Eliquis was prescribed to treat blood clots that may have been found in the veins of your legs (deep vein thrombosis) or in your lungs (pulmonary embolism) and to reduce the risk of them occurring again.  What do You need to know about Eliquis ? Your dose is ONE 5 mg tablet taken TWICE daily.  Eliquis may be taken with or without food.   Try to take the dose about the same time in the morning and in the evening. If you have difficulty swallowing the tablet whole please discuss with your pharmacist how to take the medication safely.  Take Eliquis exactly as prescribed and DO NOT stop taking Eliquis without talking to the doctor who prescribed the medication.  Stopping may increase your risk of developing a new blood clot.  Refill your prescription before you run out.  After discharge, you should have regular check-up appointments with your healthcare provider that is prescribing your Eliquis.    What do you do if you miss a dose? If a dose of ELIQUIS is not taken at the scheduled time, take it as soon as possible on the same day and twice-daily administration should be resumed. The dose should not be doubled to make up for a missed dose.  Important Safety Information A possible side effect of Eliquis is bleeding. You should call your healthcare provider right away if you experience any of the following: ? Bleeding from an injury or your nose that does not stop. ? Unusual colored urine (red or dark brown) or unusual colored stools (red or black). ? Unusual bruising for unknown reasons. ? A serious fall or if you hit your head (even if there is no bleeding).  Some medicines may interact with Eliquis and  might increase your risk of bleeding or clotting while on Eliquis. To help avoid this, consult your healthcare provider or pharmacist prior to using any new prescription or non-prescription medications, including herbals, vitamins, non-steroidal anti-inflammatory drugs (NSAIDs) and supplements.  This website has more information on Eliquis (apixaban): http://www.eliquis.com/eliquis/home

## 2019-08-04 NOTE — Progress Notes (Signed)
   NAME:  Cheryl Goodman, MRN:  [id number], DOB:  [birth year], LOS: 3 ADMISSION DATE:  08/01/2019, CONSULTATION DATE:  08/02/19 REFERRING MD:  Grandville Silos, CHIEF COMPLAINT:  SOB   Brief History   78 year old woman with hx of recent GIB presenting with submassive PE.  History of present illness   78 year old woman w/ recent hx of GIB from erosive gastritis (10/12-10/14, needed 2 units pRBCs), HTN, HLD who presents with left leg pain and SOB.  Imaging shows femoral vein clot and submassive PE.  Patient has been started on heparin drip.  PCCM consulted to assist with management.  Currently leg pain better as is breathing.  HD stable, minimal O2 requirement.  Constipated x 2 days.  Admits to being sedentary since recent admission.  Mammogram about 2 years ago.  Colonoscopy not up to date.    Past Medical History  HTN HLD GERD Arthritis  Significant Hospital Events   10/28 Admitted  Consults:  PCCM  Procedures:  None  Significant Diagnostic Tests:  OSH CTA: Bilateral PE with CT signs of RV strain Trop 81 BNP 1k  Micro Data:  None  Antimicrobials:  None   Interim history/subjective:  No events H/H stable HD stable Needed O2 with ambulation yesterday  Objective   Blood pressure 108/62, pulse 66, temperature 98 F (36.7 C), temperature source Oral, resp. rate 16, height 4\' 11"  (1.499 m), weight 82.4 kg, SpO2 100 %.        Intake/Output Summary (Last 24 hours) at 08/04/2019 0824 Last data filed at 08/04/2019 0700 Gross per 24 hour  Intake 598.7 ml  Output 75 ml  Net 523.7 ml   Filed Weights   08/01/19 1527 08/03/19 0600 08/04/19 0200  Weight: 84.1 kg 81 kg 82.4 kg    Examination: GEN: elderly woman in NAD HEENT: MMM, no thrush CV: RRR, ext warm PULM: Clear, no accessory muscle use GI: Soft, hypoactive BS EXT: Trace L edema with more prominent veins, arthritic changes of hands NEURO: Moves all 4 ext to command PSYCH: AOx3, good insight SKIN: No  rashes  Echo with usual RV strain pattern  Resolved Hospital Problem list   NA  Assessment & Plan:  # Submassive PE- recent significant GIB, age and cardiopulmonary status argue for [facility] alone and no advanced interventions.  Should she not tolerate AC an IVC filter can be considered. # Left femoral clot # Recent UGIB- H/H stable here # Cystitis- ceftriaxone started 10/29 # Constipation- suppository ordered for this AM # Muscular deconditioning- related to recent hospitalization and sedentary lifestyle in interim  - Monitor H/H, continue PPI BID indefinitely for now - Switch to eliquis, no 10mg  intro dosing - Await PT eval, suspect will need home PT - Bowel regimen as ordered - IS, will need 2L with activity and at night for next month or so - At least 6 months AC, would call this provoked, further [facility] depends on risk/benefit discussion - Okay to transfer to tele and home Monday if remains stable - Will sign off, call if questions/concerns  Erskine Emery MD PCCM

## 2019-08-04 NOTE — Progress Notes (Signed)
Pt has arrived to Room 1503. Alert and oriented and without c/o. Spouse at bedside.

## 2019-08-04 NOTE — Progress Notes (Signed)
PROGRESS NOTE    Cheryl Goodman  XLK:[id number] DOB: 09/10/1941 DOA: 08/01/2019 PCP: Danella Penton, MD    Brief Narrative:  HPI per Dr. Haskel Khan is a 78 y.o. female with medical history significant for erosive gastritis with recent upper GI bleeding, hypertension, hyperlipidemia, and OSA who presents to the ED for evaluation of dyspnea and left leg pain.  Patient was recently hospitalized at [facility] hospital from 07/16/2019-07/18/2019 for upper GI bleed due to erosive gastritis seen on EGD 07/17/2019.  She required transfusion of 2 units PRBCs and was discharged on oral PPI therapy..  Patient states that since discharge from the hospital she has been having some pain in her legs.  Over the last week she has developed dyspnea on exertion with progressive lethargy and weakness.  She denies any associated chest pain or palpitations.  She reports a chronic cough which she attributes to her reflux.  Cough is nonproductive and she denies any hemoptysis.  She denies any other recent obvious bleeding since discharge from the hospital including epistaxis, hematemesis, coffee-ground emesis, vaginal bleeding, hematuria, hematochezia, or melena.  ED Course:  Initial vitals showed BP 130/89, pulse 98, RR 16, temp 98.9 Fahrenheit, SPO2 94% on room air.  Labs notable for WBC 13.7, hemoglobin 11.5, platelets 306,000, sodium 139, potassium 3.8, BUN 20, creatinine 1.08, lipase 28, BNP 1062.l5, high-sensitivity troponin I 81 then 73, D-dimer 14.29.  SARS-CoV-2 test was obtained and pending.  Venous ultrasound left lower extremity preliminary findings showed acute DVT of the left common femoral vein, left femoral vein, left proximal profunda vein, left popliteal vein, and left posterior tibial veins.  CTA chest PE studies showed extensive bilateral pulmonary emboli in the distal central, lobar and segmental arteries throughout both lungs with CT evidence of right heart strain suggestive  of submassive PE.  A 1.1 cm splenic artery aneurysm was seen, unchanged from 2016 study.  EDP discussed the case with PCCM who recommended hospitalist admission and discussion with IR for IVC filter placement.  EDP discussed with IR who are aware of case and holding off on IVC filter placement as patient currently on anticoagulation.  The hospitalist service was consulted admit for further evaluation and management.  Assessment & Plan:   Principal Problem:   Bilateral pulmonary embolism (HCC) Active Problems:   Gastroesophageal reflux disease with esophagitis without hemorrhage   Acute deep vein thrombosis (DVT) of left lower extremity (HCC)   Erosive gastritis   Elevated troponin   Acute lower UTI   Constipation   1 submassive bilateral pulmonary emboli/extensive left lower extremity DVT(left common femoral vein, left femoral vein, left proximal profunda vein, left popliteal vein, left posterior tibial veins.) Questionable etiology.  Patient presented with worsening shortness of breath, left lower extremity pain and swelling.  Left lower extremity Dopplers done consistent with extensive left lower extremity DVT (left common femoral vein, left femoral vein, left proximal profunda vein, left popliteal vein, left posterior tibial veins).  CT angiogram of chest consistent with extensive bilateral pulmonary emboli in the distal central, lobar and segmental arteries throughout both lungs with CT evidence of right heart strain suggestive of submassive PE). Patient noted to have recently been hospitalized at [facility] from 07/16/2019 to 07/18/2019 for upper GI bleed secondary to erosive gastritis seen on EGD of 07/17/2019.  Patient on PPI.  Patient denies any overt bleeding since being started on IV heparin.  2D echo with severely dilated right ventricle with severely reduced right ventricular function.  Septal flattening  consistent with right ventricular pressure overload.  Right ventricular apical  hypokinesis consistent with McConnell sign which is consistent with acute cor pulmonale in the setting of submassive PE.  Left ventricular EF 60 to 65%.  Borderline left ventricular hypertrophy.  Global right ventricle has severely reduced systolic function.  Right atrial size severely dilated.  Presence of pericardial fat pad. Patient with a elevated BNP and elevated troponin likely due to right heart strain.  Continue IV heparin.  IR was consulted about emergent IVC filter placement however.  IR note critical care fellow patient hemodynamically stable and not considering thrombolytic therapy at this time.  It is felt that patient may need a filter if she starts to have recurrent GI bleed.  Patient with hemodynamic stability at this point in time.  Due to patient's increased propensity to decompensate critical care medicine was consulted and are following.  Per critical care due to patient's recent significant GI bleed, age and cardiopulmonary status are recommending anticoagulation alone with no advanced interventions.  If patient unable to tolerate anticoagulation and IVC filter could be considered.  Patient has been transitioned to Eliquis from heparin by PCCM.  Patient will need at least 3 to 6 months of anticoagulation as well called is likely a provoked incidents.  PT/OT.  Incentive spirometry.  Patient will likely need 2 L O2 with activity and at night for the next month or so per pulmonary recommendations.  Transfer to telemetry.  Appreciate PCCM's input and recommendations.    2.  Erosive gastritis with recent upper GI bleed Patient admitted at [facility] from 07/16/2019 to 07/18/2019.  Hemoglobin currently stable.  Patient denies any overt bleeding.  Continue IV PPI twice daily for now for upper GI protection.  H. pylori serology was done and [facility] and pending at the time of discharge.  Monitor closely for bleeding as patient on IV heparin.  Outpatient follow-up with GI.  3.  Elevated troponin Likely  secondary to demand ischemia secondary to problem #1 from probable right ventricular strain.  Troponin was 81 and trended down to 73.  Patient with no overt chest pain.  2D echo with severely dilated right ventricle with severely reduced right ventricular function.  Septal flattening consistent with right ventricular pressure overload.  Right ventricular apical hypokinesis consistent with McConnell sign which is consistent with acute cor pulmonale in the setting of submassive PE.  Left ventricular EF 60 to 65%.  Borderline left ventricular hypertrophy.  Global right ventricle has severely reduced systolic function.  Right atrial size severely dilated.  Presence of pericardial fat pad.  Continue treatment as in problem #1.  Follow.  4.  OSA CPAP nightly.  5.  UTI Patient with complaints of dysuria during this hospitalization.  Urinalysis on admission with large leukocytes, nitrite negative, 21-50 WBCs.  Urine cultures pending.  Patient was placed on ciprofloxacin on 08/02/2019 which was changed to IV Rocephin.  Urine cultures negative.  Continue IV Rocephin and treat empirically for 3 days.   6.  Constipation Having bowel movement.  Continue MiraLAX daily and Senokot-S twice daily.   DVT prophylaxis: Full dose heparin Code Status: Full Family Communication: Updated patient.  No family at bedside. Disposition Plan: Transfer to telemetry.    Consultants:   PCCM: Dr. Katrinka Blazing 08/02/2019  Interventional radiology: Dr. Lowella Dandy 08/01/2019  Procedures:   2D echo 08/02/2019  CT angiogram chest 08/01/2019  Chest x-ray 08/01/2019  Lower extremity Doppler left leg 08/01/2019  Antimicrobials:  IV Rocephin 08/03/2019  Oral ciprofloxacin 10/29/2020x1  dose   Subjective: Patient sitting up on bedside commode having a bowel movement.  Patient feels shortness of breath improving daily.  Denies any ongoing chest pain.  No abdominal pain.  No melanotic stools.  No hematemesis.   Objective: Vitals:    08/04/19 0300 08/04/19 0400 08/04/19 0500 08/04/19 0700  BP:  104/69 108/62   Pulse: 68 67 65 66  Resp: (!) 22 (!) 21 19 16   Temp:  98 F (36.7 C)  97.6 F (36.4 C)  TempSrc:  Oral  Oral  SpO2: 94% 93% 95% 100%  Weight:      Height:        Intake/Output Summary (Last 24 hours) at 08/04/2019 0952 Last data filed at 08/04/2019 0700 Gross per 24 hour  Intake 598.7 ml  Output -  Net 598.7 ml   Filed Weights   08/01/19 1527 08/03/19 0600 08/04/19 0200  Weight: 84.1 kg 81 kg 82.4 kg    Examination:  General exam: NAD Respiratory system: CTAB.  No wheezes no crackles no rhonchi.  Decreased breath sounds in the bases.  Speaking in full sentences.  Normal respiratory effort. Cardiovascular system: RRR no murmurs rubs or gallops.  No JVD.  No lower extremity edema.  Gastrointestinal system: Abdomen is nontender, nondistended, soft, positive bowel sounds.  No rebound.  No guarding.  Central nervous system: Alert and oriented. No focal neurological deficits. Extremities: Left lower extremity with decreased tenderness to palpation.  Skin: No rashes, lesions or ulcers Psychiatry: Judgement and insight appear normal. Mood & affect appropriate.     Data Reviewed: I have personally reviewed following labs and imaging studies  CBC: Recent Labs  Lab 08/01/19 1607 08/02/19 0521 08/03/19 0533 08/04/19 0555  WBC 13.7* 13.9* 14.8* 11.9*  NEUTROABS 10.8*  --   --   --   HGB 11.5* 11.0* 10.3* 9.9*  HCT 39.1 37.8 35.1* 34.2*  MCV 85.4 86.3 85.6 87.0  PLT 306 279 315 573   Basic Metabolic Panel: Recent Labs  Lab 08/01/19 1607 08/02/19 0521 08/03/19 0533 08/04/19 0555  NA 139 138 138 138  K 3.8 3.7 3.9 4.4  CL 105 107 107 107  CO2 21* 19* 22 22  GLUCOSE 135* 139* 109* 109*  BUN 20 21 21 23   CREATININE 1.08* 1.00 0.99 1.03*  CALCIUM 9.1 8.9 8.6* 8.7*   GFR: Estimated Creatinine Clearance: 41.9 mL/min (A) (by C-G formula based on SCr of 1.03 mg/dL (H)). Liver Function  Tests: Recent Labs  Lab 08/01/19 1607  AST 20  ALT 21  ALKPHOS 66  BILITOT 0.7  PROT 6.8  ALBUMIN 3.4*   Recent Labs  Lab 08/01/19 1607  LIPASE 28   No results for input(s): AMMONIA in the last 168 hours. Coagulation Profile: Recent Labs  Lab 08/01/19 1607  INR 1.1   Cardiac Enzymes: No results for input(s): CKTOTAL, CKMB, CKMBINDEX, TROPONINI in the last 168 hours. BNP (last 3 results) No results for input(s): PROBNP in the last 8760 hours. HbA1C: No results for input(s): HGBA1C in the last 72 hours. CBG: No results for input(s): GLUCAP in the last 168 hours. Lipid Profile: No results for input(s): CHOL, HDL, LDLCALC, TRIG, CHOLHDL, LDLDIRECT in the last 72 hours. Thyroid Function Tests: No results for input(s): TSH, T4TOTAL, FREET4, T3FREE, THYROIDAB in the last 72 hours. Anemia Panel: No results for input(s): VITAMINB12, FOLATE, FERRITIN, TIBC, IRON, RETICCTPCT in the last 72 hours. Sepsis Labs: No results for input(s): PROCALCITON, LATICACIDVEN in the last 168 hours.  Recent Results (from the past 240 hour(s))  SARS CORONAVIRUS 2 (TAT 6-24 HRS) Nasopharyngeal Nasopharyngeal Swab     Status: None   Collection Time: 08/01/19  5:31 PM   Specimen: Nasopharyngeal Swab  Result Value Ref Range Status   SARS Coronavirus 2 NEGATIVE NEGATIVE Final    Comment: (NOTE) SARS-CoV-2 target nucleic acids are NOT DETECTED. The SARS-CoV-2 RNA is generally detectable in upper and lower respiratory specimens during the acute phase of infection. Negative results do not preclude SARS-CoV-2 infection, do not rule out co-infections with other pathogens, and should not be used as the sole basis for treatment or other patient management decisions. Negative results must be combined with clinical observations, patient history, and epidemiological information. The expected result is Negative. Fact Sheet for Patients: HairSlick.nohttps://www.fda.gov/media/138098/download Fact Sheet for Healthcare  Providers: quierodirigir.comhttps://www.fda.gov/media/138095/download This test is not yet approved or cleared by the [place] FDA and  has been authorized for detection and/or diagnosis of SARS-CoV-2 by FDA under an Emergency Use Authorization (EUA). This EUA will remain  in effect (meaning this test can be used) for the duration of the COVID-19 declaration under Section 56 4(b)(1) of the Act, 21 U.S.C. section 360bbb-3(b)(1), unless the authorization is terminated or revoked sooner. Performed at [facility], [address] [place], [place] [address]   MRSA PCR Screening     Status: None   Collection Time: 08/01/19 10:45 PM   Specimen: Nasal Mucosa; Nasopharyngeal  Result Value Ref Range Status   MRSA by PCR NEGATIVE NEGATIVE Final    Comment:        The GeneXpert MRSA Assay (FDA approved for NASAL specimens only), is one component of a comprehensive MRSA colonization surveillance program. It is not intended to diagnose MRSA infection nor to guide or monitor treatment for MRSA infections. Performed at [facility], [address] [place], [place] [address]   Culture, Urine     Status: None   Collection Time: 08/02/19  5:57 AM   Specimen: Urine, Catheterized  Result Value Ref Range Status   Specimen Description   Final    URINE, CATHETERIZED Performed at [facility], [address] [place], [place] [address]    Special Requests   Final    NONE Performed at [facility], [address] [place], [place] [address]    Culture   Final    NO GROWTH Performed at [facility], [address] [place], [place] [address]    Report Status 08/03/2019 FINAL  Final         Radiology Studies: No results found.      Scheduled Meds: . apixaban  5 mg Oral BID  . Chlorhexidine Gluconate Cloth  6 each Topical Daily  . mouth rinse  15 mL Mouth Rinse BID  . pantoprazole (PROTONIX) IV  40 mg Intravenous Q12H  .  polyethylene glycol  17 g Oral Daily  . senna-docusate  1 tablet Oral BID  . sodium chloride flush  3 mL Intravenous Q12H   Continuous Infusions: . cefTRIAXone (ROCEPHIN)  IV Stopped (08/03/19 1230)     LOS: 3 days    Time spent: 40 minutes    Ramiro Harvestaniel Hairo Garraway, MD [facility]  If 7PM-7AM, please contact night-coverage [email or web address] 08/04/2019, 9:52 AM

## 2019-08-04 NOTE — Evaluation (Signed)
Physical Therapy Evaluation Patient Details Name: Cheryl Goodman MRN: [id number] DOB: 1940-10-30 Today's Date: 08/04/2019   History of Present Illness  77 y.o. female with medical history significant for erosive gastritis with recent upper GI bleeding(hospitalization at [facility]),  hypertension, hyperlipidemia, and OSA . Pt admitted with  dyspnea and left leg pain.  CTA chest PE studies showed extensive bilateral pulmonary emboli in the distal central, lobar and segmental arteries throughout both lungs with CT evidence of right heart strain suggestive of submassive PE  Clinical Impression  Pt admitted with above diagnosis.  Pt amb ~ 160' with RW and min assist, SpO2= 96-100% on 2L, minimal DOE however pt did complain of LE fatigue; improved from amb attempts yesterday--short distance with nursing. Will continue to follow   Pt currently with functional limitations due to the deficits listed below (see PT Problem List). Pt will benefit from skilled PT to increase their independence and safety with mobility to allow discharge to the venue listed below.       Follow Up Recommendations Home health PT    Equipment Recommendations  None recommended by PT    Recommendations for Other Services       Precautions / Restrictions Precautions Precautions: Fall Precaution Comments: monitor O2 sats Restrictions Weight Bearing Restrictions: No      Mobility  Bed Mobility Overal bed mobility: Needs Assistance Bed Mobility: Supine to Sit     Supine to sit: Min guard     General bed mobility comments: for lines and safety  Transfers Overall transfer level: Needs assistance Equipment used: Rolling walker (2 wheeled) Transfers: Sit to/from Stand Sit to Stand: Min assist         General transfer comment: light assist to rise and transition to RW, cues for hand placement  Ambulation/Gait Ambulation/Gait assistance: Min assist;Min guard Gait Distance (Feet): 160 Feet Assistive device:  Rolling walker (2 wheeled) Gait Pattern/deviations: Step-through pattern;Decreased stride length     General Gait Details: cues for trunk extension, breathing. SpO2=96-100% on 2L [place]  Stairs            Wheelchair Mobility    Modified Rankin (Stroke Patients Only)       Balance Overall balance assessment: Needs assistance Sitting-balance support: No upper extremity supported;Feet unsupported Sitting balance-Leahy Scale: Good       Standing balance-Leahy Scale: Fair Standing balance comment: able to briefly stand without UE support                             Pertinent Vitals/Pain Pain Assessment: No/denies pain    Home Living Family/patient expects to be discharged to:: Private residence Living Arrangements: Spouse/significant other Available Help at Discharge: Family   Home Access: Stairs to enter Entrance Stairs-Rails: Right Entrance Stairs-Number of Steps: 4-5 Home Layout: Two level;Able to live on main level with bedroom/bathroom Home Equipment: Walker - 4 wheels;Walker - 2 wheels;Shower seat      Prior Function Level of Independence: Independent               Hand Dominance        Extremity/Trunk Assessment   Upper Extremity Assessment Upper Extremity Assessment: Generalized weakness    Lower Extremity Assessment Lower Extremity Assessment: Generalized weakness       Communication      Cognition Arousal/Alertness: Awake/alert Behavior During Therapy: WFL for tasks assessed/performed Overall Cognitive Status: Within Functional Limits for tasks assessed  General Comments      Exercises     Assessment/Plan    PT Assessment Patient needs continued PT services  PT Problem List Decreased strength;Decreased range of motion;Decreased activity tolerance;Decreased mobility;Decreased balance;Cardiopulmonary status limiting activity;Decreased knowledge of use of DME        PT Treatment Interventions DME instruction;Therapeutic activities;Gait training;Therapeutic exercise;Patient/family education;Functional mobility training    PT Goals (Current goals can be found in the Care Plan section)  Acute Rehab PT Goals PT Goal Formulation: With patient Time For Goal Achievement: 08/05/19 Potential to Achieve Goals: Good    Frequency Min 3X/week   Barriers to discharge        Co-evaluation               AM-PAC PT "6 Clicks" Mobility  Outcome Measure Help needed turning from your back to your side while in a flat bed without using bedrails?: A Little Help needed moving from lying on your back to sitting on the side of a flat bed without using bedrails?: A Little Help needed moving to and from a bed to a chair (including a wheelchair)?: A Little Help needed standing up from a chair using your arms (e.g., wheelchair or bedside chair)?: A Little Help needed to walk in hospital room?: A Little Help needed climbing 3-5 steps with a railing? : A Little 6 Click Score: 18    End of Session Equipment Utilized During Treatment: Gait belt Activity Tolerance: Patient tolerated treatment well Patient left: with call bell/phone within reach;in chair;with family/visitor present Nurse Communication: Mobility status PT Visit Diagnosis: Difficulty in walking, not elsewhere classified (R26.2);Muscle weakness (generalized) (M62.81);Unsteadiness on feet (R26.81)    Time: 0962-8366 PT Time Calculation (min) (ACUTE ONLY): 25 min   Charges:   PT Evaluation $PT Eval Low Complexity: 1 Low PT Treatments $Gait Training: 8-22 mins        Kenyon Ana, PT  Pager: [phone] Acute Rehab Dept [facility]): [phone]   08/04/2019   [facility] 08/04/2019, 1:56 PM

## 2019-08-05 ENCOUNTER — Inpatient Hospital Stay (HOSPITAL_COMMUNITY): Payer: Medicare HMO

## 2019-08-05 LAB — CBC WITH DIFFERENTIAL/PLATELET
Abs Immature Granulocytes: 0.09 10*3/uL — ABNORMAL HIGH (ref 0.00–0.07)
Basophils Absolute: 0.1 10*3/uL (ref 0.0–0.1)
Basophils Relative: 1 %
Eosinophils Absolute: 0.3 10*3/uL (ref 0.0–0.5)
Eosinophils Relative: 3 %
HCT: 32.5 % — ABNORMAL LOW (ref 36.0–46.0)
Hemoglobin: 9.5 g/dL — ABNORMAL LOW (ref 12.0–15.0)
Immature Granulocytes: 1 %
Lymphocytes Relative: 13 %
Lymphs Abs: 1.4 10*3/uL (ref 0.7–4.0)
MCH: 25.2 pg — ABNORMAL LOW (ref 26.0–34.0)
MCHC: 29.2 g/dL — ABNORMAL LOW (ref 30.0–36.0)
MCV: 86.2 fL (ref 80.0–100.0)
Monocytes Absolute: 0.8 10*3/uL (ref 0.1–1.0)
Monocytes Relative: 8 %
Neutro Abs: 7.8 10*3/uL — ABNORMAL HIGH (ref 1.7–7.7)
Neutrophils Relative %: 74 %
Platelets: 319 10*3/uL (ref 150–400)
RBC: 3.77 MIL/uL — ABNORMAL LOW (ref 3.87–5.11)
RDW: 21.2 % — ABNORMAL HIGH (ref 11.5–15.5)
WBC: 10.4 10*3/uL (ref 4.0–10.5)
nRBC: 0 % (ref 0.0–0.2)

## 2019-08-05 LAB — BASIC METABOLIC PANEL
Anion gap: 10 (ref 5–15)
BUN: 21 mg/dL (ref 8–23)
CO2: 22 mmol/L (ref 22–32)
Calcium: 9 mg/dL (ref 8.9–10.3)
Chloride: 108 mmol/L (ref 98–111)
Creatinine, Ser: 0.91 mg/dL (ref 0.44–1.00)
GFR calc Af Amer: >60 mL/min (ref >60)
GFR calc non Af Amer: >60 mL/min (ref >60)
Glucose, Bld: 108 mg/dL — ABNORMAL HIGH (ref 70–99)
Potassium: 4.1 mmol/L (ref 3.5–5.1)
Sodium: 140 mmol/L (ref 135–145)

## 2019-08-05 LAB — MAGNESIUM: Magnesium: 2.3 mg/dL (ref 1.7–2.4)

## 2019-08-05 MED ORDER — BENZONATATE 100 MG PO CAPS
100.0000 mg | ORAL_CAPSULE | Freq: Three times a day (TID) | ORAL | Status: DC | PRN
  Administered 2019-08-05 – 2019-08-06 (×3): 100 mg via ORAL
  Filled 2019-08-05 (×3): qty 1

## 2019-08-05 MED ORDER — LIDOCAINE VISCOUS HCL 2 % MT SOLN: 15.0000 mL | Freq: Four times a day (QID) | OROMUCOSAL | Status: DC | PRN

## 2019-08-05 MED ORDER — ALUM & MAG HYDROXIDE-SIMETH 200-200-20 MG/5ML PO SUSP: 30.0000 mL | Freq: Four times a day (QID) | ORAL | Status: DC | PRN

## 2019-08-05 MED ORDER — PANTOPRAZOLE SODIUM 40 MG PO TBEC
40.0000 mg | DELAYED_RELEASE_TABLET | Freq: Two times a day (BID) | ORAL | Status: DC
  Administered 2019-08-05 – 2019-08-06 (×2): 40 mg via ORAL
  Filled 2019-08-05 (×2): qty 1

## 2019-08-05 NOTE — Progress Notes (Addendum)
PROGRESS NOTE    Cheryl Goodman  XLK:[id number] DOB: 09/10/1941 DOA: 08/01/2019 PCP: Danella Penton, MD    Brief Narrative:  HPI per Dr. Haskel Khan is a 78 y.o. female with medical history significant for erosive gastritis with recent upper GI bleeding, hypertension, hyperlipidemia, and OSA who presents to the ED for evaluation of dyspnea and left leg pain.  Patient was recently hospitalized at [facility] hospital from 07/16/2019-07/18/2019 for upper GI bleed due to erosive gastritis seen on EGD 07/17/2019.  She required transfusion of 2 units PRBCs and was discharged on oral PPI therapy..  Patient states that since discharge from the hospital she has been having some pain in her legs.  Over the last week she has developed dyspnea on exertion with progressive lethargy and weakness.  She denies any associated chest pain or palpitations.  She reports a chronic cough which she attributes to her reflux.  Cough is nonproductive and she denies any hemoptysis.  She denies any other recent obvious bleeding since discharge from the hospital including epistaxis, hematemesis, coffee-ground emesis, vaginal bleeding, hematuria, hematochezia, or melena.  ED Course:  Initial vitals showed BP 130/89, pulse 98, RR 16, temp 98.9 Fahrenheit, SPO2 94% on room air.  Labs notable for WBC 13.7, hemoglobin 11.5, platelets 306,000, sodium 139, potassium 3.8, BUN 20, creatinine 1.08, lipase 28, BNP 1062.l5, high-sensitivity troponin I 81 then 73, D-dimer 14.29.  SARS-CoV-2 test was obtained and pending.  Venous ultrasound left lower extremity preliminary findings showed acute DVT of the left common femoral vein, left femoral vein, left proximal profunda vein, left popliteal vein, and left posterior tibial veins.  CTA chest PE studies showed extensive bilateral pulmonary emboli in the distal central, lobar and segmental arteries throughout both lungs with CT evidence of right heart strain suggestive  of submassive PE.  A 1.1 cm splenic artery aneurysm was seen, unchanged from 2016 study.  EDP discussed the case with PCCM who recommended hospitalist admission and discussion with IR for IVC filter placement.  EDP discussed with IR who are aware of case and holding off on IVC filter placement as patient currently on anticoagulation.  The hospitalist service was consulted admit for further evaluation and management.  Assessment & Plan:   Principal Problem:   Bilateral pulmonary embolism (HCC) Active Problems:   Gastroesophageal reflux disease with esophagitis without hemorrhage   Acute deep vein thrombosis (DVT) of left lower extremity (HCC)   Erosive gastritis   Elevated troponin   Acute lower UTI   Constipation   1 submassive bilateral pulmonary emboli/extensive left lower extremity DVT(left common femoral vein, left femoral vein, left proximal profunda vein, left popliteal vein, left posterior tibial veins.) Questionable etiology.  Patient presented with worsening shortness of breath, left lower extremity pain and swelling.  Left lower extremity Dopplers done consistent with extensive left lower extremity DVT (left common femoral vein, left femoral vein, left proximal profunda vein, left popliteal vein, left posterior tibial veins).  CT angiogram of chest consistent with extensive bilateral pulmonary emboli in the distal central, lobar and segmental arteries throughout both lungs with CT evidence of right heart strain suggestive of submassive PE). Patient noted to have recently been hospitalized at [facility] from 07/16/2019 to 07/18/2019 for upper GI bleed secondary to erosive gastritis seen on EGD of 07/17/2019.  Patient on PPI.  Patient denies any overt bleeding since being started on IV heparin.  2D echo with severely dilated right ventricle with severely reduced right ventricular function.  Septal flattening  consistent with right ventricular pressure overload.  Right ventricular apical  hypokinesis consistent with McConnell sign which is consistent with acute cor pulmonale in the setting of submassive PE.  Left ventricular EF 60 to 65%.  Borderline left ventricular hypertrophy.  Global right ventricle has severely reduced systolic function.  Right atrial size severely dilated.  Presence of pericardial fat pad. Patient with a elevated BNP and elevated troponin likely due to right heart strain.  Continue IV heparin.  IR was consulted about emergent IVC filter placement however.  IR note critical care fellow patient hemodynamically stable and not considering thrombolytic therapy at this time.  It is felt that patient may need a filter if she starts to have recurrent GI bleed.  Patient with hemodynamic stability at this point in time.  Due to patient's increased propensity to decompensate critical care medicine was consulted.  Per critical care due to patient's recent significant GI bleed, age and cardiopulmonary status are recommending anticoagulation alone with no advanced interventions.  If patient unable to tolerate anticoagulation and IVC filter could be considered.  Patient has been transitioned to Eliquis from heparin by PCCM.  Patient will need at least 3 to 6 months of anticoagulation as well as is likely a provoked incidents.  PT/OT.  Incentive spirometry.  Patient will likely need 2 L O2 with activity and at night for the next month or so per pulmonary recommendations.  Check ambulatory sats.  Appreciate PCCM's input and recommendations.    2.  Erosive gastritis with recent upper GI bleed Patient admitted at [facility] from 07/16/2019 to 07/18/2019.  Hemoglobin currently stable.  Patient denies any overt bleeding.  Change IV PPI to oral PPI twice daily. H. pylori serology was done and [facility] and pending at the time of discharge.  Monitor closely for bleeding on anticoagulation.  Outpatient follow-up with GI.   3.  Elevated troponin Likely secondary to demand ischemia secondary to problem #1  from probable right ventricular strain.  Troponin was 81 and trended down to 73.  Patient with no overt chest pain.  2D echo with severely dilated right ventricle with severely reduced right ventricular function.  Septal flattening consistent with right ventricular pressure overload.  Right ventricular apical hypokinesis consistent with McConnell sign which is consistent with acute cor pulmonale in the setting of submassive PE.  Left ventricular EF 60 to 65%.  Borderline left ventricular hypertrophy.  Global right ventricle has severely reduced systolic function.  Right atrial size severely dilated.  Presence of pericardial fat pad.  Continue treatment as in problem #1.  Follow.  4.  OSA CPAP nightly.  5.  UTI Patient with complaints of dysuria during this hospitalization.  Urinalysis on admission with large leukocytes, nitrite negative, 21-50 WBCs.  Urine cultures pending.  Patient was placed on ciprofloxacin on 08/02/2019 which was changed to IV Rocephin.  Urine cultures negative.  IV Rocephin x3 days.   6.  Constipation Improved on current bowel regimen of MiraLAX daily and Senokot-S twice daily.  7.  Cough Check a chest x-ray.  Tessalon Perles as needed.  DVT prophylaxis: Eliquis Code Status: Full Family Communication: Updated patient.  No family at bedside. Disposition Plan: Likely home tomorrow if continued improvement.    Consultants:   PCCM: Dr. Tamala Julian 08/02/2019  Interventional radiology: Dr. Anselm Pancoast 08/01/2019  Procedures:   2D echo 08/02/2019  CT angiogram chest 08/01/2019  Chest x-ray 08/01/2019  Lower extremity Doppler left leg 08/01/2019  Antimicrobials:  IV Rocephin 08/03/2019 >>>> 08/05/2019  Oral  ciprofloxacin 10/29/2020x1 dose   Subjective: Patient sitting up on the side of the bed.  Patient states has a cough and due to her pulmonary status is somewhat concerned.  No significant shortness of breath.  Denies any chest pain.  No signs of bleeding.  Husband at  bedside.   Objective: Vitals:   08/04/19 1646 08/04/19 1712 08/04/19 2037 08/05/19 0546  BP:  117/72 (!) 119/58 137/85  Pulse:  75 76 70  Resp:  Temp:  (!) 97.5 F (36.4 C) 98.5 F (36.9 C) 98 F (36.7 C)  TempSrc:  Oral Oral Oral  SpO2: 100% 98% 96% 93%  Weight:    84.4 kg  Height:        Intake/Output Summary (Last 24 hours) at 08/05/2019 1252 Last data filed at 08/04/2019 2110 Gross per 24 hour  Intake 103 ml  Output -  Net 103 ml   Filed Weights   08/03/19 0600 08/04/19 0200 08/05/19 0546  Weight: 81 kg 82.4 kg 84.4 kg    Examination:  General exam: NAD Respiratory system: Lungs clear to auscultation bilaterally.  No wheezes, no crackles, no rhonchi.  Normal respiratory effort.  Speaking in full sentences. Cardiovascular system: Regular rate and rhythm no murmurs rubs or gallops.  No JVD.  No lower extremity edema. Gastrointestinal system: Abdomen is soft, nontender, nondistended, positive bowel sounds.  No rebound.  No guarding.  Central nervous system: Alert and oriented. No focal neurological deficits. Extremities: 1+ left lower extremity edema.  Trace right lower extremity edema.  Skin: No rashes, lesions or ulcers Psychiatry: Judgement and insight appear normal. Mood & affect appropriate.     Data Reviewed: I have personally reviewed following labs and imaging studies  CBC: Recent Labs  Lab 08/01/19 1607 08/02/19 0521 08/03/19 0533 08/04/19 0555 08/05/19 0536  WBC 13.7* 13.9* 14.8* 11.9* 10.4  NEUTROABS 10.8*  --   --   --  7.8*  HGB 11.5* 11.0* 10.3* 9.9* 9.5*  HCT 39.1 37.8 35.1* 34.2* 32.5*  MCV 85.4 86.3 85.6 87.0 86.2  PLT 306 279 315 310 319   Basic Metabolic Panel: Recent Labs  Lab 08/01/19 1607 08/02/19 0521 08/03/19 0533 08/04/19 0555 08/05/19 0536  NA 139 138 138 138 140  K 3.8 3.7 3.9 4.4 4.1  CL 105 107 107 107 108  CO2 21* 19* GLUCOSE 135* 139* 109* 109* 108*  BUN CREATININE 1.08* 1.00  0.99 1.03* 0.91  CALCIUM 9.1 8.9 8.6* 8.7* 9.0  MG  --   --   --   --  2.3   GFR: Estimated Creatinine Clearance: 48 mL/min (by C-G formula based on SCr of 0.91 mg/dL). Liver Function Tests: Recent Labs  Lab 08/01/19 1607  AST 20  ALT 21  ALKPHOS 66  BILITOT 0.7  PROT 6.8  ALBUMIN 3.4*   Recent Labs  Lab 08/01/19 1607  LIPASE 28   No results for input(s): AMMONIA in the last 168 hours. Coagulation Profile: Recent Labs  Lab 08/01/19 1607  INR 1.1   Cardiac Enzymes: No results for input(s): CKTOTAL, CKMB, CKMBINDEX, TROPONINI in the last 168 hours. BNP (last 3 results) No results for input(s): PROBNP in the last 8760 hours. HbA1C: No results for input(s): HGBA1C in the last 72 hours. CBG: No results for input(s): GLUCAP in the last 168 hours. Lipid Profile: No results for input(s): CHOL, HDL, LDLCALC, TRIG, CHOLHDL, LDLDIRECT in the last 72  hours. Thyroid Function Tests: No results for input(s): TSH, T4TOTAL, FREET4, T3FREE, THYROIDAB in the last 72 hours. Anemia Panel: No results for input(s): VITAMINB12, FOLATE, FERRITIN, TIBC, IRON, RETICCTPCT in the last 72 hours. Sepsis Labs: No results for input(s): PROCALCITON, LATICACIDVEN in the last 168 hours.  Recent Results (from the past 240 hour(s))  SARS CORONAVIRUS 2 (TAT 6-24 HRS) Nasopharyngeal Nasopharyngeal Swab     Status: None   Collection Time: 08/01/19  5:31 PM   Specimen: Nasopharyngeal Swab  Result Value Ref Range Status   SARS Coronavirus 2 NEGATIVE NEGATIVE Final    Comment: (NOTE) SARS-CoV-2 target nucleic acids are NOT DETECTED. The SARS-CoV-2 RNA is generally detectable in upper and lower respiratory specimens during the acute phase of infection. Negative results do not preclude SARS-CoV-2 infection, do not rule out co-infections with other pathogens, and should not be used as the sole basis for treatment or other patient management decisions. Negative results must be combined with clinical  observations, patient history, and epidemiological information. The expected result is Negative. Fact Sheet for Patients: HairSlick.nohttps://www.fda.gov/media/138098/download Fact Sheet for Healthcare Providers: quierodirigir.comhttps://www.fda.gov/media/138095/download This test is not yet approved or cleared by the [place] FDA and  has been authorized for detection and/or diagnosis of SARS-CoV-2 by FDA under an Emergency Use Authorization (EUA). This EUA will remain  in effect (meaning this test can be used) for the duration of the COVID-19 declaration under Section 56 4(b)(1) of the Act, 21 U.S.C. section 360bbb-3(b)(1), unless the authorization is terminated or revoked sooner. Performed at [facility], [address] [place], [place] [address]   MRSA PCR Screening     Status: None   Collection Time: 08/01/19 10:45 PM   Specimen: Nasal Mucosa; Nasopharyngeal  Result Value Ref Range Status   MRSA by PCR NEGATIVE NEGATIVE Final    Comment:        The GeneXpert MRSA Assay (FDA approved for NASAL specimens only), is one component of a comprehensive MRSA colonization surveillance program. It is not intended to diagnose MRSA infection nor to guide or monitor treatment for MRSA infections. Performed at [facility], [address] [place], [place] [address]   Culture, Urine     Status: None   Collection Time: 08/02/19  5:57 AM   Specimen: Urine, Catheterized  Result Value Ref Range Status   Specimen Description   Final    URINE, CATHETERIZED Performed at [facility], [address] [place], [place] [address]    Special Requests   Final    NONE Performed at [facility], [address] [place], [place] [address]    Culture   Final    NO GROWTH Performed at [facility], [address] [place], [place] [address]    Report Status 08/03/2019 FINAL  Final         Radiology Studies: No results  found.      Scheduled Meds: . apixaban  5 mg Oral BID  . Chlorhexidine Gluconate Cloth  6 each Topical Daily  . mouth rinse  15 mL Mouth Rinse BID  . pantoprazole (PROTONIX) IV  40 mg Intravenous Q12H  . polyethylene glycol  17 g Oral Daily  . senna-docusate  1 tablet Oral BID  . sodium chloride flush  3 mL Intravenous Q12H   Continuous Infusions:    LOS: 4 days    Time spent: 40 minutes    Ramiro Harvestaniel Mirha Brucato, MD [facility]  If 7PM-7AM, please  contact night-coverage [email or web address] 08/05/2019, 12:52 PM

## 2019-08-05 NOTE — Progress Notes (Signed)
Occupational Therapy Evaluation Patient Details Name: Cheryl Goodman MRN: [id number] DOB: [birth year] Today's Date: 08/05/2019    History of Present Illness 78 y.o. female with medical history significant for erosive gastritis with recent upper GI bleeding(hospitalization at [facility]),  hypertension, hyperlipidemia, and OSA . Pt admitted with  dyspnea and left leg pain.  CTA chest PE studies showed extensive bilateral pulmonary emboli in the distal central, lobar and segmental arteries throughout both lungs with CT evidence of right heart strain suggestive of submassive PE   Clinical Impression   PATIENT HAS DECREASED I AND SAFETY WITH ADLS AND ADL TRANSFERS. PATIENT REQUIRES FURTHER ED ON DME AND AE TO INCREASE I AND SAFETY. PATIENT EASILY BECOMES SOB WITH MINIMAL ACTIVITY. PATIENT IS REQUIRING MOD A WITH LE ADLS AND S/SETUP WITH UE ADLS. PNT IS MIN A WITH TRANSFER TO BSC. PATIENT TO BE FOLLOWED BY ACUTE OT AND RECOMMEND HHOT AT D/C.     Follow Up Recommendations  Home health OT    Equipment Recommendations  None recommended by OT    Recommendations for Other Services       Precautions / Restrictions Precautions Precautions: Fall Precaution Comments: monitor O2 sats Restrictions Weight Bearing Restrictions: No      Mobility Bed Mobility         Supine to sit: Supervision        Transfers       Sit to Stand: Min assist         General transfer comment: HAND HELD ASSIST TO TRANSFER TO COMMODE    Balance                                           ADL either performed or assessed with clinical judgement   ADL Overall ADL's : Needs assistance/impaired Eating/Feeding: Independent   Grooming: Wash/dry hands;Wash/dry face;Supervision/safety;Standing   Upper Body Bathing: Supervision/ safety;Set up;Sitting   Lower Body Bathing: Moderate assistance;Sit to/from stand   Upper Body Dressing : Supervision/safety;Set up;Sitting   Lower Body  Dressing: Moderate assistance;Sit to/from stand   Toilet Transfer: Minimal assistance   Toileting- Clothing Manipulation and Hygiene: Minimal assistance       Functional mobility during ADLs: Minimal assistance General ADL Comments: PATIENT IS HAVING DIFFICULTY WITH LE DRESSING AND NEEDS FURTHER ED ON USE OF AE.      Vision Baseline Vision/History: Wears glasses Wears Glasses: Reading only       Perception     Praxis      Pertinent Vitals/Pain Pain Assessment: No/denies pain     Hand Dominance Right   Extremity/Trunk Assessment Upper Extremity Assessment Upper Extremity Assessment: Generalized weakness           Communication Communication Communication: No difficulties   Cognition Arousal/Alertness: Awake/alert Behavior During Therapy: WFL for tasks assessed/performed Overall Cognitive Status: Within Functional Limits for tasks assessed                                     General Comments       Exercises     Shoulder Instructions      Home Living Family/patient expects to be discharged to:: Private residence Living Arrangements: Spouse/significant other Available Help at Discharge: Family   Home Access: Stairs to enter Technical brewer of Steps: 4-5 Entrance Stairs-Rails: Right Home Layout: Two level;Able  to live on main level with bedroom/bathroom               Home Equipment: Walker - 4 wheels;Walker - 2 wheels;Shower seat;Bedside commode          Prior Functioning/Environment Level of Independence: Independent                 OT Problem List: Decreased strength;Decreased activity tolerance;Decreased knowledge of use of DME or AE      OT Treatment/Interventions: Self-care/ADL training;DME and/or AE instruction;Therapeutic activities;Patient/family education    OT Goals(Current goals can be found in the care plan section) Acute Rehab OT Goals Patient Stated Goal: GO HOME OT Goal Formulation: With  patient Time For Goal Achievement: 08/19/19 Potential to Achieve Goals: Good  OT Frequency: Min 2X/week   Barriers to D/C:            Co-evaluation              AM-PAC OT "6 Clicks" Daily Activity     Outcome Measure Help from another person eating meals?: None Help from another person taking care of personal grooming?: A Little Help from another person toileting, which includes using toliet, bedpan, or urinal?: A Little Help from another person bathing (including washing, rinsing, drying)?: A Lot Help from another person to put on and taking off regular upper body clothing?: A Little Help from another person to put on and taking off regular lower body clothing?: A Lot 6 Click Score: 17   End of Session Nurse Communication: (OK THERAPY)  Activity Tolerance: Patient limited by fatigue Patient left: in bed;with call bell/phone within reach  OT Visit Diagnosis: Unsteadiness on feet (R26.81)                Time: 8502-7741 OT Time Calculation (min): 44 min Charges:  OT General Charges $OT Visit: 1 Visit OT Evaluation $OT Eval Low Complexity: 1 Low OT Treatments $Self Care/Home Management : 8-22 mins 6 CLICKS  Keola Heninger 08/05/2019, 9:45 AM

## 2019-08-05 NOTE — Plan of Care (Signed)
PATIENT HAS DECREASED I AND SAFETY WITH ADLS AND ADL TRANSFERS. PATIENT REQUIRES FURTHER ED ON DME AND AE TO INCREASE I AND SAFETY. PATIENT EASILY BECOMES SOB WITH MINIMAL ACTIVITY. PATIENT IS REQUIRING MOD A WITH LE ADLS AND S/SETUP WITH UE ADLS. PNT IS MIN A WITH TRANSFER TO BSC. PATIENT TO BE FOLLOWED BY ACUTE OT AND RECOMMEND HHOT AT D/C.

## 2019-08-05 NOTE — Progress Notes (Signed)
SATURATION QUALIFICATIONS: (This note is used to comply with regulatory documentation for home oxygen)  Patient Saturations on Room Air at Rest = 95%  Patient Saturations on Room Air while Ambulating = 98%  Patient Saturations on 0 Liters of oxygen while Ambulating = 98%  Please briefly explain why patient needs home oxygen: 

## 2019-08-06 ENCOUNTER — Ambulatory Visit: Payer: Self-pay

## 2019-08-06 DIAGNOSIS — R05 Cough: Secondary | ICD-10-CM

## 2019-08-06 DIAGNOSIS — R059 Cough, unspecified: Secondary | ICD-10-CM

## 2019-08-06 DIAGNOSIS — I82412 Acute embolism and thrombosis of left femoral vein: Secondary | ICD-10-CM

## 2019-08-06 LAB — CBC
HCT: 35.8 % — ABNORMAL LOW (ref 36.0–46.0)
Hemoglobin: 10.4 g/dL — ABNORMAL LOW (ref 12.0–15.0)
MCH: 24.9 pg — ABNORMAL LOW (ref 26.0–34.0)
MCHC: 29.1 g/dL — ABNORMAL LOW (ref 30.0–36.0)
MCV: 85.6 fL (ref 80.0–100.0)
Platelets: 352 10*3/uL (ref 150–400)
RBC: 4.18 MIL/uL (ref 3.87–5.11)
RDW: 21.2 % — ABNORMAL HIGH (ref 11.5–15.5)
WBC: 9.4 10*3/uL (ref 4.0–10.5)
nRBC: 0 % (ref 0.0–0.2)

## 2019-08-06 LAB — BASIC METABOLIC PANEL
Anion gap: 10 (ref 5–15)
BUN: 19 mg/dL (ref 8–23)
CO2: 24 mmol/L (ref 22–32)
Calcium: 9.2 mg/dL (ref 8.9–10.3)
Chloride: 105 mmol/L (ref 98–111)
Creatinine, Ser: 0.92 mg/dL (ref 0.44–1.00)
GFR calc Af Amer: >60 mL/min (ref >60)
GFR calc non Af Amer: 60 mL/min — ABNORMAL LOW (ref >60)
Glucose, Bld: 105 mg/dL — ABNORMAL HIGH (ref 70–99)
Potassium: 4.1 mmol/L (ref 3.5–5.1)
Sodium: 139 mmol/L (ref 135–145)

## 2019-08-06 MED ORDER — POLYETHYLENE GLYCOL 3350 17 G PO PACK: 17.0000 g | PACK | Freq: Every day | ORAL | 0 refills | Status: DC

## 2019-08-06 MED ORDER — PANTOPRAZOLE SODIUM 40 MG PO TBEC: 40.0000 mg | DELAYED_RELEASE_TABLET | Freq: Two times a day (BID) | ORAL | 1 refills | Status: DC

## 2019-08-06 MED ORDER — BENZONATATE 100 MG PO CAPS: 100.0000 mg | ORAL_CAPSULE | Freq: Three times a day (TID) | ORAL | 0 refills | Status: DC | PRN

## 2019-08-06 MED ORDER — SENNOSIDES-DOCUSATE SODIUM 8.6-50 MG PO TABS: 1.0000 | ORAL_TABLET | Freq: Two times a day (BID) | ORAL | Status: DC

## 2019-08-06 MED ORDER — APIXABAN 5 MG PO TABS: 5.0000 mg | ORAL_TABLET | Freq: Two times a day (BID) | ORAL | 1 refills | Status: DC

## 2019-08-06 NOTE — Progress Notes (Signed)
Physical Therapy Treatment Patient Details Name: Cheryl Goodman MRN: [id number] DOB: 1941-04-09 Today's Date: 08/06/2019    History of Present Illness 78 y.o. female with medical history significant for erosive gastritis with recent upper GI bleeding(hospitalization at [facility]),  hypertension, hyperlipidemia, and OSA . Pt admitted with  dyspnea and left leg pain.  CTA chest PE studies showed extensive bilateral pulmonary emboli in the distal central, lobar and segmental arteries throughout both lungs with CT evidence of right heart strain suggestive of submassive PE    PT Comments    Progressing well with mobility. O2 92% on RA during session. Pt is much more efficient and steady with RW use-recommend she continue using RW for now.   Follow Up Recommendations  Home health PT     Equipment Recommendations  None recommended by PT    Recommendations for Other Services       Precautions / Restrictions Precautions Precautions: Fall Precaution Comments: monitor O2 sats Restrictions Weight Bearing Restrictions: No    Mobility  Bed Mobility               General bed mobility comments: oob in recliner  Transfers Overall transfer level: Needs assistance Equipment used: None Transfers: Sit to/from Stand Sit to Stand: Min guard         General transfer comment: for safety.  Ambulation/Gait Ambulation/Gait assistance: Min guard;Supervision Gait Distance (Feet): 75 Feet(15 feet without UE support) Assistive device: Rolling walker (2 wheeled);None Gait Pattern/deviations: Step-through pattern;Decreased stride length     General Gait Details: Began walk without RW/UE support, pt was Min guard assist but less efficient and steady. Switched over to RW use, pt was Supv assist and much more efficient. O2 92% on RA, dyspnea 2/4 during ambulation   Stairs             Wheelchair Mobility    Modified Rankin (Stroke Patients Only)       Balance Overall balance  assessment: Needs assistance           Standing balance-Leahy Scale: Fair Standing balance comment: able to stand and ambulate a few steps without UE support. Much more steady and efficient with UE support                            Cognition Arousal/Alertness: Awake/alert Behavior During Therapy: WFL for tasks assessed/performed Overall Cognitive Status: Within Functional Limits for tasks assessed                                        Exercises      General Comments        Pertinent Vitals/Pain Pain Assessment: No/denies pain    Home Living                      Prior Function            PT Goals (current goals can now be found in the care plan section) Progress towards PT goals: Progressing toward goals    Frequency    Min 3X/week      PT Plan Current plan remains appropriate    Co-evaluation              AM-PAC PT "6 Clicks" Mobility   Outcome Measure  Help needed turning from your back to your side while in a flat bed  without using bedrails?: A Little Help needed moving from lying on your back to sitting on the side of a flat bed without using bedrails?: A Little Help needed moving to and from a bed to a chair (including a wheelchair)?: A Little Help needed standing up from a chair using your arms (e.g., wheelchair or bedside chair)?: A Little Help needed to walk in hospital room?: A Little Help needed climbing 3-5 steps with a railing? : A Little 6 Click Score: 18    End of Session Equipment Utilized During Treatment: Gait belt Activity Tolerance: Patient tolerated treatment well Patient left: in chair;with call bell/phone within reach;with family/visitor present   PT Visit Diagnosis: Difficulty in walking, not elsewhere classified (R26.2)     Time: 0952-1000 PT Time Calculation (min) (ACUTE ONLY): 8 min  Charges:  $Gait Training: 8-22 mins                       Weston Anna, [place] Pager: [phone] Office: [phone]

## 2019-08-06 NOTE — Progress Notes (Signed)
Occupational Therapy Treatment Patient Details Name: Cheryl Goodman MRN: [id number] DOB: 01/22/1941 Today's Date: 08/06/2019    History of present illness 78 y.o. female with medical history significant for erosive gastritis with recent upper GI bleeding(hospitalization at [facility]),  hypertension, hyperlipidemia, and OSA . Pt admitted with  dyspnea and left leg pain.  CTA chest PE studies showed extensive bilateral pulmonary emboli in the distal central, lobar and segmental arteries throughout both lungs with CT evidence of right heart strain suggestive of submassive PE   OT comments  Pt seen for OT tx this date to f/u re: safety with seated and standing ADLs, ADL mobility, and fxl transfers. Pt's husband present throughout tx session. Pt demos G static standing balance with no AD and F dynamic balance. Pt advised re: use of grab bar in restroom for commode transfer and performs with good safety awareness at CGA-Supv level. Pt stands sink-side to complete hand washing and brush hair with G standing balance, stable O2 sats (>92% throughout). Pt improved in all aspects assessed. Pt with good family support and adequate balance to be safe at home with 24/7 supervision. No HHOT appears to be indicated.    Follow Up Recommendations  No OT follow up;Supervision/Assistance - 24 hour    Equipment Recommendations  None recommended by OT    Recommendations for Other Services      Precautions / Restrictions Precautions Precautions: Fall Precaution Comments: monitor O2 sats Restrictions Weight Bearing Restrictions: No       Mobility Bed Mobility Overal bed mobility: Needs Assistance Bed Mobility: Supine to Sit     Supine to sit: Supervision        Transfers Overall transfer level: Needs assistance Equipment used: None Transfers: Sit to/from Stand Sit to Stand: Supervision;Min guard         General transfer comment: pt with no overt LOB, CGA-Supv provided for safety    Balance  Overall balance assessment: Needs assistance   Sitting balance-Leahy Scale: Normal       Standing balance-Leahy Scale: Fair Standing balance comment: Pt with G static standing balance with no AD and F standing balance with more dyanmic mvmt or fxl mobiltiy, more steady with RW, but overall no LOB.                           ADL either performed or assessed with clinical judgement   ADL Overall ADL's : Needs assistance/impaired     Grooming: Wash/dry hands;Brushing hair;Standing;Supervision/safety                   Toilet Transfer: Supervision/safety;Grab Ship broker- Clothing Manipulation and Hygiene: Supervision/safety;Min guard;Sit to/from stand       Functional mobility during ADLs: Supervision/safety;Min guard       Vision Patient Visual Report: No change from baseline     Perception     Praxis      Cognition Arousal/Alertness: Awake/alert Behavior During Therapy: WFL for tasks assessed/performed Overall Cognitive Status: Within Functional Limits for tasks assessed                                          Exercises Other Exercises Other Exercises: OT facilitates education with pt and husband who is present throughout treatment re: general safety and fall prevention strategies including use of grab bar in restroom. Pt and husband  verbalize understanding and ask appropriate questions.   Shoulder Instructions       General Comments      Pertinent Vitals/ Pain       Pain Assessment: No/denies pain  Home Living                                          Prior Functioning/Environment              Frequency  Min 1X/week        Progress Toward Goals  OT Goals(current goals can now be found in the care plan section)  Progress towards OT goals: Progressing toward goals  Acute Rehab OT Goals Patient Stated Goal: GO HOME OT Goal Formulation: With patient Time For Goal  Achievement: 08/19/19 Potential to Achieve Goals: Good  Plan Discharge plan needs to be updated    Co-evaluation                 AM-PAC OT "6 Clicks" Daily Activity     Outcome Measure   Help from another person eating meals?: None Help from another person taking care of personal grooming?: A Little Help from another person toileting, which includes using toliet, bedpan, or urinal?: A Little Help from another person bathing (including washing, rinsing, drying)?: A Little Help from another person to put on and taking off regular upper body clothing?: None Help from another person to put on and taking off regular lower body clothing?: A Little 6 Click Score: 20    End of Session Equipment Utilized During Treatment: Gait belt  OT Visit Diagnosis: Unsteadiness on feet (R26.81)   Activity Tolerance     Patient Left in bed;with call bell/phone within reach;with family/visitor present   Nurse Communication          Time: 1236-1300 OT Time Calculation (min): 24 min  Charges: OT General Charges $OT Visit: 1 Visit OT Treatments $Self Care/Home Management : 8-22 mins $Therapeutic Activity: 8-22 mins    Sharren Bridge  Pager [phone] 08/06/2019, 2:37 PM

## 2019-08-06 NOTE — Care Management Important Message (Signed)
Important Message  Patient Details IM Letter given to Sharren Bridge SW to present to the Patient Name: Cheryl Goodman MRN: [id number] Date of Birth: Sep 17, 1941   Medicare Important Message Given:  Yes     Kerin Salen 08/06/2019, 10:52 AM

## 2019-08-06 NOTE — TOC Initial Note (Signed)
Transition of Care [facility]) - Initial/Assessment Note    Patient Details  Name: Cheryl Goodman MRN: [id number] Date of Birth: 1941/08/31  [facility] of [facility]) CM/SW Contact:    Nelwyn Salisbury, LCSW Phone Number: [phone] 08/06/2019, 10:52 AM  Clinical Narrative:     Pt admitted with DVT and GI bleed, from home where she resides with her husband.  Has RW, rollater, shower chair, and shower handle set up at home.  Discussed HH therapy recommendations- pt in agreement but requests PT only. Husband in agreement, states pt completed  ADL tasks this morning fairly independently and he will be available for assist once home. Discussed agency options and referred to [facility]- accepted for HHPT.            Expected Discharge Plan: Home w Home Health Services Barriers to Discharge: No Barriers Identified   Patient Goals and CMS Choice Patient states their goals for this hospitalization and ongoing recovery are:: "ready to get back home and keep doing therapy" CMS Medicare.gov Compare Post Acute Care list provided to:: Patient Choice offered to / list presented to : Patient, Spouse  Expected Discharge Plan and Services Expected Discharge Plan: Home w Home Health Services   Discharge Planning Services: CM Consult Post Acute Care Choice: Home Health Living arrangements for the past 2 months: Single Family Home                           HH Arranged: PT HH Agency: [facility] (formerly [facility] Date HH Agency Contacted: 08/06/19 Time HH Agency Contacted: 1038 Representative spoke with at [facility] Agency: Kathlene November  Prior Living Arrangements/Services Living arrangements for the past 2 months: Single Family Home Lives with:: Spouse Patient language and need for interpreter reviewed:: Yes Do you feel safe going back to the place where you live?: Yes      Need for Family Participation in Patient Care: Yes (Comment)(husband) Care giver support system in place?: Yes  (comment) Current home services: DME Criminal Activity/Legal Involvement Pertinent to Current Situation/Hospitalization: No - Comment as needed  Activities of Daily Living Home Assistive Devices/Equipment: Blood pressure cuff ADL Screening (condition at time of admission) Patient's cognitive ability adequate to safely complete daily activities?: Yes Is the patient deaf or have difficulty hearing?: No Does the patient have difficulty seeing, even when wearing glasses/contacts?: No Does the patient have difficulty concentrating, remembering, or making decisions?: No Patient able to express need for assistance with ADLs?: Yes Does the patient have difficulty dressing or bathing?: No Independently performs ADLs?: Yes (appropriate for developmental age) Does the patient have difficulty walking or climbing stairs?: Yes(weakness) Weakness of Legs: Both Weakness of Arms/Hands: None  Permission Sought/Granted Permission sought to share information with : Family Supports Permission granted to share information with : Yes, Verbal Permission Granted  Share Information with NAME: [phone] husband Ervon at bedside           Emotional Assessment Appearance:: Appears stated age Attitude/Demeanor/Rapport: Gracious, Self-Confident, Engaged Affect (typically observed): Adaptable, Hopeful, Pleasant Orientation: : Oriented to Self, Oriented to Place, Oriented to  Time, Oriented to Situation Alcohol / Substance Use: Not Applicable Psych Involvement: No (comment)  Admission diagnosis:  SOB (shortness of breath) [R06.02] Generalized weakness [R53.1] AKI (acute kidney injury) (HCC) [N17.9] Acute deep vein thrombosis (DVT) of proximal vein of left lower extremity (HCC) [I82.4Y2] Other acute pulmonary embolism with acute cor pulmonale (HCC) [I26.09] Patient Active Problem List   Diagnosis  Date Noted  . Acute lower UTI 08/03/2019  . Constipation 08/03/2019  . Erosive gastritis   . Elevated  troponin   . Bilateral pulmonary embolism ([place]) 08/01/2019  . Acute deep vein thrombosis (DVT) of left lower extremity ([place]) 08/01/2019  . Gastroesophageal reflux disease with esophagitis without hemorrhage   . Stomach irritation   . Hiatal hernia   . Melena   . GI bleed 07/16/2019  . OA (osteoarthritis) of knee 10/31/2017   PCP:  [name], MD Pharmacy:   [id number] - , [place] [address] Phone: [phone] Fax: [phone]

## 2019-08-06 NOTE — Discharge Summary (Signed)
Physician Discharge Summary  EDOM SCHMUHL ZOX:[id number] DOB: [birth year] DOA: 08/01/2019  PCP: Danella Penton, MD  Admit date: 08/01/2019 Discharge date: 08/06/2019  Time spent: 60 minutes  Recommendations for Outpatient Follow-up:  1. Follow-up with Danella Penton, MD as scheduled this week.  On follow-up patient will need a CBC done to follow-up on H&H.  Patient also need a basic metabolic profile done to follow-up on electrolytes and renal function. 2. Follow-up with gastroenterology in 1 week for follow-up on prior hospitalization and biopsies done during that hospitalization.   Discharge Diagnoses:  Principal Problem:   Bilateral pulmonary embolism (HCC) Active Problems:   Gastroesophageal reflux disease with esophagitis without hemorrhage   Acute deep vein thrombosis (DVT) of left lower extremity (HCC)   Erosive gastritis   Elevated troponin   Acute lower UTI   Constipation   Discharge Condition: Stable and improved  Diet recommendation: Heart healthy  Filed Weights   08/04/19 0200 08/05/19 0546 08/06/19 0552  Weight: 82.4 kg 84.4 kg 84.1 kg    History of present illness:  HPI per Dr. Haskel Khan is a 78 y.o. female with medical history significant for erosive gastritis with recent upper GI bleeding, hypertension, hyperlipidemia, and OSA who presented to the ED for evaluation of dyspnea and left leg pain.  Patient was recently hospitalized at [facility] hospital from 07/16/2019-07/18/2019 for upper GI bleed due to erosive gastritis seen on EGD 07/17/2019.  She required transfusion of 2 units PRBCs and was discharged on oral PPI therapy..  Patient stated that since discharge from the hospital she had been having some pain in her legs.  Over the last week she had developed dyspnea on exertion with progressive lethargy and weakness.  She denied any associated chest pain or palpitations.  She reported a chronic cough which she attributed to her reflux.  Cough  was nonproductive and she denied any hemoptysis.  She denied any other recent obvious bleeding since discharge from the hospital including epistaxis, hematemesis, coffee-ground emesis, vaginal bleeding, hematuria, hematochezia, or melena.  ED Course:  Initial vitals showed BP 130/89, pulse 98, RR 16, temp 98.9 Fahrenheit, SPO2 94% on room air.  Labs notable for WBC 13.7, hemoglobin 11.5, platelets 306,000, sodium 139, potassium 3.8, BUN 20, creatinine 1.08, lipase 28, BNP 1062.l5, high-sensitivity troponin I 81 then 73, D-dimer 14.29.  SARS-CoV-2 test was obtained and pending.  Venous ultrasound left lower extremity preliminary findings showed acute DVT of the left common femoral vein, left femoral vein, left proximal profunda vein, left popliteal vein, and left posterior tibial veins.  CTA chest PE studies showed extensive bilateral pulmonary emboli in the distal central, lobar and segmental arteries throughout both lungs with CT evidence of right heart strain suggestive of submassive PE.  A 1.1 cm splenic artery aneurysm was seen, unchanged from 2016 study.  EDP discussed the case with PCCM who recommended hospitalist admission and discussion with IR for IVC filter placement.  EDP discussed with IR who are aware of case and holding off on IVC filter placement as patient currently on anticoagulation.  The hospitalist service was consulted admit for further evaluation and management.  Hospital Course:  1 submassive bilateral pulmonary emboli/extensive left lower extremity DVT(left common femoral vein, left femoral vein, left proximal profunda vein, left popliteal vein, left posterior tibial veins.) Questionable etiology.  Patient presented with worsening shortness of breath, left lower extremity pain and swelling.  Left lower extremity Dopplers done consistent with extensive left lower extremity DVT (left  common femoral vein, left femoral vein, left proximal profunda vein, left popliteal vein,  left posterior tibial veins).  CT angiogram of chest consistent with extensive bilateral pulmonary emboli in the distal central, lobar and segmental arteries throughout both lungs with CT evidence of right heart strain suggestive of submassive PE). Patient noted to have recently been hospitalized at [facility] from 07/16/2019 to 07/18/2019 for upper GI bleed secondary to erosive gastritis seen on EGD of 07/17/2019.  Patient was maintained on PPI during this hospitalization. Patient denied any overt bleeding since being started on IV heparin.  2D echo with severely dilated right ventricle with severely reduced right ventricular function.  Septal flattening consistent with right ventricular pressure overload.  Right ventricular apical hypokinesis consistent with McConnell sign which is consistent with acute cor pulmonale in the setting of submassive PE.  Left ventricular EF 60 to 65%.  Borderline left ventricular hypertrophy.  Global right ventricle has severely reduced systolic function.  Right atrial size severely dilated.  Presence of pericardial fat pad. Patient with a elevated BNP and elevated troponin likely due to right heart strain. IR was consulted about emergent IVC filter placement however.  IR noted critical care felt patient was hemodynamically stable and not considering thrombolytic therapy at this time.  It was felt that patient may need a filter if she started to have recurrent GI bleed.  Patient remained hemodynamically stable during the hospitalization. Due to patient's increased propensity to decompensate critical care medicine was consulted.  Per critical care due to patient's recent significant GI bleed, age and cardiopulmonary status are recommended anticoagulation alone with no advanced interventions.  If patient unable to tolerate anticoagulation and IVC filter could be considered.  Patient improved clinically and subsequently transition to Eliquis from heparin by PCCM.  It is noted that patient  will need at least 6 months of anticoagulation as likely a provoked incident.  Patient was seen by PT OT.  Patient improved clinically and did not require home O2 on discharge.  Patient with sats of 95% on room air by day of discharge.  Patient will discharge home with home health therapies.  Outpatient follow-up with PCP.    2.  Erosive gastritis with recent upper GI bleed Patient admitted at [facility] from 07/16/2019 to 07/18/2019.  Hemoglobin remained stable throughout the hospitalization.  Patient denied any overt bleeding.  Patient was initially placed on IV PPI twice daily and subsequently transition to oral PPI twice daily which she will be discharged home on. H. pylori serology was done at [facility] and pending at the time of discharge. Outpatient follow-up with GI.   3.  Elevated troponin Likely secondary to demand ischemia secondary to problem #1 from probable right ventricular strain.  Troponin was 81 and trended down to 73.  Patient with no overt chest pain.  2D echo with severely dilated right ventricle with severely reduced right ventricular function.  Septal flattening consistent with right ventricular pressure overload.  Right ventricular apical hypokinesis consistent with McConnell sign which was consistent with acute cor pulmonale in the setting of submassive PE.  Left ventricular EF 60 to 65%.  Borderline left ventricular hypertrophy.  Global right ventricle has severely reduced systolic function.  Right atrial size severely dilated.  Presence of pericardial fat pad.    Patient was treated with anticoagulation with IV heparin and subsequently transition to oral Eliquis.  Patient remained chest pain-free.  No further cardiac work-up needed in-house.  Outpatient follow-up.   4.  OSA CPAP nightly.  5.  UTI Patient with complaints of dysuria during this hospitalization.  Urinalysis on admission with large leukocytes, nitrite negative, 21-50 WBCs.  Urine cultures were negative. Patient was  initially placed on ciprofloxacin on 08/02/2019 which was changed to IV Rocephin.  Patient received 3 days of IV antibiotics during the hospitalization.  No further antibiotics needed on discharge.  Outpatient follow-up.   6.  Constipation Improved on bowel regimen of MiraLAX daily and Senokot-S twice daily.  Outpatient follow-up.  7.  Cough Chest x-ray done was unremarkable.  Patient improved clinically.  Patient remained afebrile.  Patient was placed on Tessalon Perles as needed.  Outpatient follow-up.    Procedures:  2D echo 08/02/2019  CT angiogram chest 08/01/2019  Chest x-ray 08/01/2019  Lower extremity Doppler left leg 08/01/2019   Consultations:  PCCM: Dr. Katrinka Blazing 08/02/2019  Interventional radiology: Dr. Lowella Dandy 08/01/2019  Discharge Exam: Vitals:   08/06/19 0552 08/06/19 1315  BP: (!) 168/80 140/77  Pulse: 71 76  Resp: 16 18  Temp: 98.1 F (36.7 C) 98.7 F (37.1 C)  SpO2: 94% 95%    General: NAD Cardiovascular: RRR Respiratory: CTAB  Discharge Instructions   Discharge Instructions    Diet - low sodium heart healthy   Complete by: As directed    Increase activity slowly   Complete by: As directed      Allergies as of 08/06/2019      Reactions   Penicillins Rash, Other (See Comments)   Has patient had a PCN reaction causing immediate rash, facial/tongue/throat swelling, SOB or lightheadedness with hypotension: No Has patient had a PCN reaction causing severe rash involving mucus membranes or skin necrosis: No Has patient had a PCN reaction that required hospitalization: No Has patient had a PCN reaction occurring within the last 10 years: No If all of the above answers are "NO", then may proceed with Cephalosporin use.   Ace Inhibitors Cough   Dexilant [dexlansoprazole] Other (See Comments)   syncope   Oxycodone Nausea Only   Sulfa Antibiotics Swelling      Medication List    STOP taking these medications   ciprofloxacin 500 MG  tablet Commonly known as: CIPRO   ferrous sulfate 325 (65 FE) MG EC tablet     TAKE these medications   apixaban 5 MG Tabs tablet Commonly known as: ELIQUIS Take 1 tablet (5 mg total) by mouth 2 (two) times daily.   benzonatate 100 MG capsule Commonly known as: TESSALON Take 1 capsule (100 mg total) by mouth 3 (three) times daily as needed for cough.   Biotin 1000 MCG tablet Take 1,000 mcg by mouth daily.   pantoprazole 40 MG tablet Commonly known as: PROTONIX Take 1 tablet (40 mg total) by mouth 2 (two) times daily before a meal.   polyethylene glycol 17 g packet Commonly known as: MIRALAX / GLYCOLAX Take 17 g by mouth daily. Start taking on: August 07, 2019   senna-docusate 8.6-50 MG tablet Commonly known as: Senokot-S Take 1 tablet by mouth 2 (two) times daily.   sucralfate 1 g tablet Commonly known as: CARAFATE Take 1 g by mouth 2 (two) times daily.   traMADol 50 MG tablet Commonly known as: ULTRAM Take 50 mg by mouth every 6 (six) hours as needed for moderate pain.   Vitamin D3 125 MCG (5000 UT) Tabs Take 5,000 Units by mouth daily.   Vitron-C 65-125 MG Tabs Generic drug: Iron-Vitamin C Take 1 tablet by mouth daily.      Allergies  Allergen  Reactions  . Penicillins Rash and Other (See Comments)    Has patient had a PCN reaction causing immediate rash, facial/tongue/throat swelling, SOB or lightheadedness with hypotension: No Has patient had a PCN reaction causing severe rash involving mucus membranes or skin necrosis: No Has patient had a PCN reaction that required hospitalization: No Has patient had a PCN reaction occurring within the last 10 years: No If all of the above answers are "NO", then may proceed with Cephalosporin use.   . Ace Inhibitors Cough  . Dexilant [Dexlansoprazole] Other (See Comments)    syncope  . Oxycodone Nausea Only  . Sulfa Antibiotics Swelling   Follow-up Information    Danella Penton, MD Follow up.   Specialty:  Internal Medicine Why: f/u as scheduled. Contact information: [address] [facility] [place] [address] [phone]        Gastroenterology. Schedule an appointment as soon as possible for a visit in 1 week(s).   Why: f/u in 1 week.           The results of significant diagnostics from this hospitalization (including imaging, microbiology, ancillary and laboratory) are listed below for reference.    Significant Diagnostic Studies: Ct Angio Chest Pe W/cm &/or Wo Cm  Result Date: 08/01/2019 CLINICAL DATA:  Shortness of breath, PE suspected EXAM: CT ANGIOGRAPHY CHEST WITH CONTRAST TECHNIQUE: Multidetector CT imaging of the chest was performed using the standard protocol during bolus administration of intravenous contrast. Multiplanar CT image reconstructions and MIPs were obtained to evaluate the vascular anatomy. CONTRAST:  OMNIPAQUE IOHEXOL 350 MG/ML SOLN COMPARISON:  CT 08/14/2015 FINDINGS: Cardiovascular: Satisfactory opacification of the pulmonary arteries to the segmental level. There are extensive bilateral pulmonary emboli in the distal central, lobar and segmental arteries throughout both lungs. There is associated right heart strain with flattening of the intraventricular septum and elevation of the RV/LV ratio (1.33). There is right heart enlargement and reflux of contrast into the hepatic veins. Atherosclerotic calcifications of the normal caliber thoracic aorta. Normal branching of the aortic arch. Mediastinum/Nodes: No enlarged mediastinal, hilar or axillary lymph nodes. Thyroid gland and thoracic inlet are unremarkable. No acute abnormality of the trachea side from posterior bowing which is seen with imaging during exhalation. There is a large hiatal hernia. Lungs/Pleura: Mosaic attenuation of the lungs can be seen in the setting of imaging during exhalation. There are diminished lung volumes with basilar areas of bandlike opacity likely  reflecting atelectasis and/or scarring. No consolidation, features of edema, pneumothorax, or effusion. No suspicious pulmonary nodules or masses. Upper Abdomen: Patient is post cholecystectomy. There is a calcified 1.1 cm splenic artery aneurysm which is unchanged since 2016. No acute abnormalities present in the visualized portions of the upper abdomen. Scattered colonic diverticula without focal pericolonic inflammation to suggest diverticulitis. Musculoskeletal: Stable compression deformity and retropulsion of the T12 vertebral body no acute or suspicious osseous lesions. No concerning chest wall lesions. Review of the MIP images confirms the above findings. IMPRESSION: 1. Extensive bilateral pulmonary emboli in the distal central, lobar and segmental arteries throughout both lungs with evidence of right heart strain. Positive for acute PE with CT evidence of right heart strain (RV/LV Ratio = 1.33) consistent with at least submassive (intermediate risk) PE. The presence of right heart strain has been associated with an increased risk of morbidity and mortality. Please activate Code PE by paging [phone]. 2. Large hiatal hernia. 3. Aortic Atherosclerosis (ICD10-I70.0). 4. 1.1 cm splenic artery aneurysm. Finding is unchanged  since 2016 and has a low overall risk of rupture. Could consider surveillance imaging on an annual basis. Recommendation is based on the consensus guidelines: Managing incidental findings on abdominal and pelvic CT and MRI, Part 2: white paper of the ACR Incidental Findings Committee II on vascular findings. J Am Coll Radiol. 2013;10 (10): 789-94. doi:10.1016/j.jacr.2013.05.021 Critical Value/emergent results were called by telephone at the time of interpretation on 08/01/2019 at 7:17 pm to providerDr Pilar Plate, who verbally acknowledged these results. Electronically Signed   By: Kreg Shropshire M.D.   On: 08/01/2019 19:19   Dg Chest Port 1 View  Result Date: 08/05/2019 CLINICAL DATA:  Cough  and shortness of breath. EXAM: PORTABLE CHEST 1 VIEW COMPARISON:  08/01/2019 FINDINGS: Stable mild cardiomegaly. A large hiatal hernia is seen. Both lungs are clear. IMPRESSION: Stable mild cardiomegaly. No active lung disease. Large hiatal hernia. Electronically Signed   By: Danae Orleans M.D.   On: 08/05/2019 17:17   Dg Chest Port 1 View  Result Date: 08/01/2019 CLINICAL DATA:  78 year old female with history of shortness of breath and generalized weakness. EXAM: PORTABLE CHEST 1 VIEW COMPARISON:  Chest x-ray 02/17/2013. FINDINGS: Lung volumes are normal. No consolidative airspace disease. No pleural effusions. No evidence of pulmonary edema. No pneumothorax. No definite suspicious appearing pulmonary nodules or masses are noted. Moderate cardiomegaly. Upper mediastinal contours are within normal limits. Aortic atherosclerosis. Large hiatal hernia. IMPRESSION: 1. No radiographic evidence of acute cardiopulmonary disease. 2. Moderate cardiomegaly. 3. Aortic atherosclerosis. 4. Large hiatal hernia. Electronically Signed   By: Trudie Reed M.D.   On: 08/01/2019 16:48   Vas [place] Lower Extremity Venous (dvt) (mc And Wl 7a-7p)  Result Date: 08/02/2019  Lower Venous Study Indications: Swelling, and Pain.  Comparison Study: no prior Performing Technologist: Blanch Media RVS  Examination Guidelines: A complete evaluation includes B-mode imaging, spectral Doppler, color Doppler, and power Doppler as needed of all accessible portions of each vessel. Bilateral testing is considered an integral part of a complete examination. Limited examinations for reoccurring indications may be performed as noted.  +---------+---------------+---------+-----------+----------+--------------+ LEFT     CompressibilityPhasicitySpontaneityPropertiesThrombus Aging +---------+---------------+---------+-----------+----------+--------------+ CFV      None           No       No                   Acute           +---------+---------------+---------+-----------+----------+--------------+ SFJ      None                                         Acute          +---------+---------------+---------+-----------+----------+--------------+ FV Prox  None                                         Acute          +---------+---------------+---------+-----------+----------+--------------+ FV Mid   None                                         Acute          +---------+---------------+---------+-----------+----------+--------------+ FV DistalNone  Acute          +---------+---------------+---------+-----------+----------+--------------+ PFV      None                                         Acute          +---------+---------------+---------+-----------+----------+--------------+ POP      None           No       No                   Acute          +---------+---------------+---------+-----------+----------+--------------+ PTV      None                                         Acute          +---------+---------------+---------+-----------+----------+--------------+ PERO                                                  Not visualized +---------+---------------+---------+-----------+----------+--------------+     Summary: Left: Findings consistent with acute deep vein thrombosis involving the left common femoral vein, left femoral vein, left proximal profunda vein, left popliteal vein, and left posterior tibial veins. No cystic structure found in the popliteal fossa.  *See table(s) above for measurements and observations. Electronically signed by Lemar LivingsBrandon Cain MD on 08/02/2019 at 4:31:32 PM.    Final     Microbiology: Recent Results (from the past 240 hour(s))  SARS CORONAVIRUS 2 (TAT 6-24 HRS) Nasopharyngeal Nasopharyngeal Swab     Status: None   Collection Time: 08/01/19  5:31 PM   Specimen: Nasopharyngeal Swab  Result Value Ref Range Status    SARS Coronavirus 2 NEGATIVE NEGATIVE Final    Comment: (NOTE) SARS-CoV-2 target nucleic acids are NOT DETECTED. The SARS-CoV-2 RNA is generally detectable in upper and lower respiratory specimens during the acute phase of infection. Negative results do not preclude SARS-CoV-2 infection, do not rule out co-infections with other pathogens, and should not be used as the sole basis for treatment or other patient management decisions. Negative results must be combined with clinical observations, patient history, and epidemiological information. The expected result is Negative. Fact Sheet for Patients: HairSlick.nohttps://www.fda.gov/media/138098/download Fact Sheet for Healthcare Providers: quierodirigir.comhttps://www.fda.gov/media/138095/download This test is not yet approved or cleared by the [place] FDA and  has been authorized for detection and/or diagnosis of SARS-CoV-2 by FDA under an Emergency Use Authorization (EUA). This EUA will remain  in effect (meaning this test can be used) for the duration of the COVID-19 declaration under Section 56 4(b)(1) of the Act, 21 U.S.C. section 360bbb-3(b)(1), unless the authorization is terminated or revoked sooner. Performed at [facility], [address] [place], [place] [address]   MRSA PCR Screening     Status: None   Collection Time: 08/01/19 10:45 PM   Specimen: Nasal Mucosa; Nasopharyngeal  Result Value Ref Range Status   MRSA by PCR NEGATIVE NEGATIVE Final    Comment:        The GeneXpert MRSA Assay (FDA approved for NASAL specimens only), is one component of a comprehensive MRSA colonization surveillance program. It is  not intended to diagnose MRSA infection nor to guide or monitor treatment for MRSA infections. Performed at [facility], [place] [address] [place], [place] [address]   Culture, Urine     Status: None   Collection Time: 08/02/19  5:57 AM   Specimen: Urine, Catheterized  Result Value Ref Range Status    Specimen Description   Final    URINE, CATHETERIZED Performed at [facility] [address], [place], [place] [address]    Special Requests   Final    NONE Performed at [facility], [place] [address], [place], [place] [address]    Culture   Final    NO GROWTH Performed at [facility], [place] [address] [place], [place] [address]    Report Status 08/03/2019 FINAL  Final     Labs: Basic Metabolic Panel: Recent Labs  Lab 08/02/19 0521 08/03/19 0533 08/04/19 0555 08/05/19 0536 08/06/19 0521  NA 138 138 138 140 139  K 3.7 3.9 4.4 4.1 4.1  CL 107 107 107 108 105  CO2 19* 22 22 22 24   GLUCOSE 139* 109* 109* 108* 105*  BUN 21 21 23 21 19   CREATININE 1.00 0.99 1.03* 0.91 0.92  CALCIUM 8.9 8.6* 8.7* 9.0 9.2  MG  --   --   --  2.3  --    Liver Function Tests: Recent Labs  Lab 08/01/19 1607  AST 20  ALT 21  ALKPHOS 66  BILITOT 0.7  PROT 6.8  ALBUMIN 3.4*   Recent Labs  Lab 08/01/19 1607  LIPASE 28   No results for input(s): AMMONIA in the last 168 hours. CBC: Recent Labs  Lab 08/01/19 1607 08/02/19 0521 08/03/19 0533 08/04/19 0555 08/05/19 0536 08/06/19 0521  WBC 13.7* 13.9* 14.8* 11.9* 10.4 9.4  NEUTROABS 10.8*  --   --   --  7.8*  --   HGB 11.5* 11.0* 10.3* 9.9* 9.5* 10.4*  HCT 39.1 37.8 35.1* 34.2* 32.5* 35.8*  MCV 85.4 86.3 85.6 87.0 86.2 85.6  PLT 306 279 315 310 319 352   Cardiac Enzymes: No results for input(s): CKTOTAL, CKMB, CKMBINDEX, TROPONINI in the last 168 hours. BNP: BNP (last 3 results) Recent Labs    08/01/19 1608  BNP 1,062.5*    ProBNP (last 3 results) No results for input(s): PROBNP in the last 8760 hours.  CBG: No results for input(s): GLUCAP in the last 168 hours.     Signed:  Irine Seal MD.  [facility] 08/06/2019, 3:46 PM

## 2019-08-07 ENCOUNTER — Other Ambulatory Visit: Payer: Self-pay

## 2019-08-07 NOTE — Patient Outreach (Addendum)
[facility]) Care Management  [place]  08/07/2019   Cheryl Goodman 07-18-1941 [id number]  Subjective: Telephone call to patient for follow after re-hospitalization for DVT on 08-01-2019 to 08-06-2019. Spoke with her and her daughter.  Patient states ok to speak with her daughter.  She states she has had an ordeal with her recent hospitalization. Patient grateful to be home. Patient has care and support of her family.  Patient now on eliquis as a blood thinner. No problems affording medications.  Discussed signs to watch for clots as well as signs of active bleeding.  Reviewed medications and corrections made.  Patient to follow up with PCP on Friday and will be making an appointment with GI physician.  Discussed importance of follow ups.    Objective:   Encounter Medications:  Outpatient Encounter Medications as of 08/07/2019  Medication Sig  . apixaban (ELIQUIS) 5 MG TABS tablet Take 1 tablet (5 mg total) by mouth 2 (two) times daily.  . benzonatate (TESSALON) 100 MG capsule Take 1 capsule (100 mg total) by mouth 3 (three) times daily as needed for cough.  . Biotin 1000 MCG tablet Take 1,000 mcg by mouth daily.  . Cholecalciferol (VITAMIN D3) 125 MCG (5000 UT) TABS Take 5,000 Units by mouth daily.  . Iron-Vitamin C (VITRON-C) 65-125 MG TABS Take 1 tablet by mouth daily.  . polyethylene glycol (MIRALAX / GLYCOLAX) 17 g packet Take 17 g by mouth daily.  Marland Kitchen senna-docusate (SENOKOT-S) 8.6-50 MG tablet Take 1 tablet by mouth 2 (two) times daily.  . sucralfate (CARAFATE) 1 g tablet Take 1 g by mouth 2 (two) times daily.  . traMADol (ULTRAM) 50 MG tablet Take 50 mg by mouth every 6 (six) hours as needed for moderate pain.  . pantoprazole (PROTONIX) 40 MG tablet Take 1 tablet (40 mg total) by mouth 2 (two) times daily before a meal. (Patient not taking: Reported on 08/07/2019)   No facility-administered encounter medications on file as of 08/07/2019.     Functional  Status:  In your present state of health, do you have any difficulty performing the following activities: 08/01/2019 07/24/2019  Hearing? N N  Vision? N N  Difficulty concentrating or making decisions? N N  Walking or climbing stairs? Y Y  Comment weakness weakness  Dressing or bathing? N N  Doing errands, shopping? N N  Conservation officer, nature and eating ? - N  Using the Toilet? - N  In the past six months, have you accidently leaked urine? - N  Managing your Medications? - N  Managing your Finances? - N  Housekeeping or managing your Housekeeping? - N  Some recent data might be hidden    Fall/Depression Screening: Fall Risk  07/24/2019  Falls in the past year? 0   PHQ 2/9 Scores 07/24/2019  PHQ - 2 Score 1    Assessment: Patient home from readmit. Patient continues to recover with support of family.    Plan:  [facility] CM Care Plan Problem One     Most Recent Value  Care Plan Problem One  Recent hospitalization Anemia/ GI Bleed and re-hospitalization related to DVT.    Role Documenting the Problem One  Care Management Telephonic [place] for Problem One  Active  [facility] Long Term Goal   Patient will not readmit to hospital for GI Bleed or DVT within 31 days.  [facility] Long Term Goal Start Date  08/07/19  Interventions for Problem One Long Term Goal  Reviewed  signs of active GI bleed and DVT with daughter.  Discussed importance of follow up with physicians.    [facility] CM Short Term Goal #1   Patient will schedule follow up with GI physician within 14 days.  [facility] CM Short Term Goal #1 Start Date  07/24/19  Interventions for Short Term Goal #1  Patient to make appointment  [facility] CM Short Term Goal #2   Patient will report improvement in ability to perform activites of daily living within 14 days.  [facility] CM Short Term Goal #2 Start Date  08/07/19  Interventions for Short Term Goal #2  Reiterated pacing self with activities and encouragement to verbalize feelings.       RN CM will outreach  again in the next two weeks and patient agreeable.   Bary Leriche, RN, MSN [facility] Care Management Care Management Coordinator Direct Line [phone] Cell [phone] Toll Free: [phone]  Fax: [phone]

## 2019-08-08 DIAGNOSIS — I82412 Acute embolism and thrombosis of left femoral vein: Secondary | ICD-10-CM | POA: Diagnosis not present

## 2019-08-08 DIAGNOSIS — K449 Diaphragmatic hernia without obstruction or gangrene: Secondary | ICD-10-CM | POA: Diagnosis not present

## 2019-08-08 DIAGNOSIS — I82442 Acute embolism and thrombosis of left tibial vein: Secondary | ICD-10-CM | POA: Diagnosis not present

## 2019-08-08 DIAGNOSIS — I700 Atherosclerosis of aorta: Secondary | ICD-10-CM | POA: Diagnosis not present

## 2019-08-08 DIAGNOSIS — K2100 Gastro-esophageal reflux disease with esophagitis, without bleeding: Secondary | ICD-10-CM | POA: Diagnosis not present

## 2019-08-08 DIAGNOSIS — I119 Hypertensive heart disease without heart failure: Secondary | ICD-10-CM | POA: Diagnosis not present

## 2019-08-08 DIAGNOSIS — I82432 Acute embolism and thrombosis of left popliteal vein: Secondary | ICD-10-CM | POA: Diagnosis not present

## 2019-08-08 DIAGNOSIS — I2699 Other pulmonary embolism without acute cor pulmonale: Secondary | ICD-10-CM | POA: Diagnosis not present

## 2019-08-08 DIAGNOSIS — I2510 Atherosclerotic heart disease of native coronary artery without angina pectoris: Secondary | ICD-10-CM | POA: Diagnosis not present

## 2019-08-09 DIAGNOSIS — I700 Atherosclerosis of aorta: Secondary | ICD-10-CM | POA: Diagnosis not present

## 2019-08-09 DIAGNOSIS — I2699 Other pulmonary embolism without acute cor pulmonale: Secondary | ICD-10-CM | POA: Diagnosis not present

## 2019-08-09 DIAGNOSIS — I2510 Atherosclerotic heart disease of native coronary artery without angina pectoris: Secondary | ICD-10-CM | POA: Diagnosis not present

## 2019-08-09 DIAGNOSIS — I82432 Acute embolism and thrombosis of left popliteal vein: Secondary | ICD-10-CM | POA: Diagnosis not present

## 2019-08-09 DIAGNOSIS — K2100 Gastro-esophageal reflux disease with esophagitis, without bleeding: Secondary | ICD-10-CM | POA: Diagnosis not present

## 2019-08-09 DIAGNOSIS — I119 Hypertensive heart disease without heart failure: Secondary | ICD-10-CM | POA: Diagnosis not present

## 2019-08-09 DIAGNOSIS — K449 Diaphragmatic hernia without obstruction or gangrene: Secondary | ICD-10-CM | POA: Diagnosis not present

## 2019-08-09 DIAGNOSIS — I82442 Acute embolism and thrombosis of left tibial vein: Secondary | ICD-10-CM | POA: Diagnosis not present

## 2019-08-09 DIAGNOSIS — I82412 Acute embolism and thrombosis of left femoral vein: Secondary | ICD-10-CM | POA: Diagnosis not present

## 2019-08-10 ENCOUNTER — Other Ambulatory Visit: Payer: Self-pay

## 2019-08-10 DIAGNOSIS — Z7901 Long term (current) use of anticoagulants: Secondary | ICD-10-CM | POA: Diagnosis not present

## 2019-08-10 DIAGNOSIS — I2609 Other pulmonary embolism with acute cor pulmonale: Secondary | ICD-10-CM

## 2019-08-10 DIAGNOSIS — I2782 Chronic pulmonary embolism: Secondary | ICD-10-CM | POA: Diagnosis not present

## 2019-08-10 DIAGNOSIS — I2601 Septic pulmonary embolism with acute cor pulmonale: Secondary | ICD-10-CM | POA: Diagnosis not present

## 2019-08-10 DIAGNOSIS — R0602 Shortness of breath: Secondary | ICD-10-CM | POA: Diagnosis not present

## 2019-08-10 DIAGNOSIS — K2960 Other gastritis without bleeding: Secondary | ICD-10-CM | POA: Diagnosis not present

## 2019-08-10 HISTORY — DX: Other pulmonary embolism with acute cor pulmonale: I26.09

## 2019-08-10 NOTE — Patient Outreach (Signed)
[facility]) Care Management  08/10/2019  Cheryl Goodman 1941/06/18 [id number]   Date of Review: 08/10/2019 Reason:  Readmission  PCP: Dr. Emily Filbert Insurance: [facility]  Medical Info: 78 y.o. female with a known history of gastric ulcer/erosive gastritis, and, hiatal hernia, arthritis. PPI were discontinued previously due to patient cough.      Admissions: Patient presented to ED on 07/16/2019 presents to the emergency room sent in by her primary care physician after she had dizziness and weakness for 2 weeks and found to have anemia with hemoglobin 6.2.  She has noticed dark tarry stools.  GI bleed found due to erosive gastritis seen on EGD 07/17/2019.  She required transfusion of 2 units PRBCs and was discharged on oral PPI therapy.   Patient admitted on 08/01/2019 leg pain and shortness of breath with progressive fatigue and weakness for cellulitis of left leg.  Venous ultrasound showed multiple DVT's in left leg and CT scan showed bilateral Pulmonary Embolism.  Initially treated with heparin and closely monitoring for bleeding with consideration of IVC filter. Patient transitioned to Eliquis and discharged home.   Disposition: Patient currently home with family support.  Patient main goal is for recovery.  PCP follow up 08/10/2019   No new recommendations from team.    Plan: RN CM will continue to outreach patient telephonically for education and support.    Jone Baseman, RN, MSN [place] Management Care Management Coordinator Direct Line [phone] Cell [phone] Toll Free: [phone]  Fax: [phone]

## 2019-08-14 ENCOUNTER — Encounter: Payer: Self-pay | Admitting: Gastroenterology

## 2019-08-14 ENCOUNTER — Other Ambulatory Visit: Payer: Self-pay

## 2019-08-14 ENCOUNTER — Ambulatory Visit (INDEPENDENT_AMBULATORY_CARE_PROVIDER_SITE_OTHER): Payer: Medicare HMO | Admitting: Gastroenterology

## 2019-08-14 VITALS — BP 121/81 | HR 83 | Temp 97.0°F | Wt 173.2 lb

## 2019-08-14 DIAGNOSIS — K2970 Gastritis, unspecified, without bleeding: Secondary | ICD-10-CM

## 2019-08-14 DIAGNOSIS — K5900 Constipation, unspecified: Secondary | ICD-10-CM | POA: Diagnosis not present

## 2019-08-14 DIAGNOSIS — B9681 Helicobacter pylori [H. pylori] as the cause of diseases classified elsewhere: Secondary | ICD-10-CM | POA: Diagnosis not present

## 2019-08-14 MED ORDER — OMEPRAZOLE 40 MG PO CPDR: 40.0000 mg | DELAYED_RELEASE_CAPSULE | Freq: Two times a day (BID) | ORAL | 0 refills | Status: DC

## 2019-08-14 MED ORDER — CLARITHROMYCIN 250 MG PO TABS: 500.0000 mg | ORAL_TABLET | Freq: Two times a day (BID) | ORAL | 0 refills | Status: AC

## 2019-08-14 MED ORDER — METRONIDAZOLE 500 MG PO TABS: 500.0000 mg | ORAL_TABLET | Freq: Three times a day (TID) | ORAL | 0 refills | Status: AC

## 2019-08-14 NOTE — Patient Instructions (Signed)
Please follow up with [facility] GI to get retested in 14 days

## 2019-08-14 NOTE — Progress Notes (Signed)
Cheryl Bouillon, MD [address]  [address]  [place], [place] [address]  Main: [phone]  Fax: [phone]   Primary Care Physician: Danella Penton, MD   Chief Complaint  Patient presents with   New Patient (Initial Visit)   Hospitalization Follow-up    Patient was seen in for the hospital for anemia and gastro hemorrage. Patient has had some abdominal pain in the morning that wakes her up     HPI: Cheryl Goodman is a 78 y.o. female here for follow-up from hospitalization for anemia and melena.  Patient was seen in GI consult in October 2020 for melena.  EGD showed grade a reflux esophagitis, gastric erosions, large hiatal hernia.  H. pylori serology showed IgM positive.  The H. pylori serology resulted after her hospitalization and she has not been treated for it yet.  Since that hospital stay, she was readmitted for shortness of breath and was found to have DVT and PEs.  She is now on Eliquis.  She has not had any further melena since the initial hospitalization.  Her hemoglobin has been improving with latest hemoglobin on 08/10/2019 being above 11.  Patient denies any NSAID use since hospitalization.  Patient is only here to get started on treatment for H. pylori as she is establishing care with GI in [place] since it takes her an hour to get to our location.  Patient has previously been seen at [facility] clinic GI, last in 2016.  Patient had an EGD in 2016, available in probation, for dysphagia.  Done by Dr. Mechele Collin.  Mild Schatzki's ring was reported and dilated to 16 mm with savory dilator.  Gastritis was reported.  Hiatal hernia was reported.  Gastric biopsies at the time did not show H. Pylori.  A pathology report from 2015 under labs showed mild chronic inactive gastritis.  No H. pylori.  Had a colonoscopy in 2015 that reported diverticulosis.  Please see provation for details of other previous procedures.  As per [facility] clinic GI notes, she has had  previous history of colon polyps  2011 pathology under labs as follows  Part A: CECUM POLYP COLD BIOPSY:  - TUBULAR ADENOMA.  - NEGATIVE FOR HIGH GRADE DYSPLASIA AND MALIGNANCY.  .  Part B: CECUM POLYP X 2 COLD SNARE:  - HYPERPLASTIC POLYP (SMALL FRAGMENTS).  - NEGATIVE FOR DYSPLASIA AND MALIGNANCY.  .  Part C: PROXIMAL STOMACH COLD BIOPSY:  - OXYNTIC MUCOSA WITH MILD CHRONIC GASTRITIS.  - NEGATIVE FOR H.PYLORI, DYSPLASIA AND MALIGNANCY.  .  Comment  A Diff Quick stain was examined on part C and is negative for  H.pylori. Controls worked appropriately.   Patient reports lower quadrant abdominal pain in the mornings only before bowel movement and the pain resolves after a bowel movement.  States is having 1-2 formed bowel movements a day without straining.  Was previously on MiraLAX and a stool softener but this was causing diarrhea and she has stopped it.  However, she says the pain is mild and since it is relieved after a bowel movement she is not bothered or concerned by it.  Describes it as to be dull, 3/10 pain at the time it occurs.  Past Medical History:  Diagnosis Date   Arthritis    Dysphagia    GERD (gastroesophageal reflux disease)    History of hiatal hernia    History of kidney stones    x2 ; passed independently   Hyperlipidemia    Hypertension  Nausea      Current Outpatient Medications  Medication Sig Dispense Refill   apixaban (ELIQUIS) 5 MG TABS tablet Take 1 tablet (5 mg total) by mouth 2 (two) times daily. 60 tablet 1   Biotin 1000 MCG tablet Take 1,000 mcg by mouth daily.     Cholecalciferol (VITAMIN D3) 125 MCG (5000 UT) TABS Take 5,000 Units by mouth daily.     Iron-Vitamin C (VITRON-C) 65-125 MG TABS Take 1 tablet by mouth daily.     omeprazole (PRILOSEC) 40 MG capsule Take 40 mg by mouth 2 (two) times daily.     polyethylene glycol (MIRALAX / GLYCOLAX) 17 g packet Take 17 g by mouth daily. 30 each 0   sucralfate (CARAFATE) 1 g  tablet Take 1 g by mouth 2 (two) times daily.     traMADol (ULTRAM) 50 MG tablet Take 50 mg by mouth every 6 (six) hours as needed for moderate pain.     clarithromycin (BIAXIN) 250 MG tablet Take 2 tablets (500 mg total) by mouth 2 (two) times daily for 14 days. 56 tablet 0   metroNIDAZOLE (FLAGYL) 500 MG tablet Take 1 tablet (500 mg total) by mouth 3 (three) times daily for 14 days. 42 tablet 0   omeprazole (PRILOSEC) 40 MG capsule Take 1 capsule (40 mg total) by mouth 2 (two) times daily for 14 days. 28 capsule 0   No current facility-administered medications for this visit.     Allergies as of 08/14/2019 - Review Complete 08/14/2019  Allergen Reaction Noted   Penicillins Rash and Other (See Comments) 08/14/2015   Ace inhibitors Cough 11/05/2015   Dexilant [dexlansoprazole] Other (See Comments) 08/14/2015   Oxycodone Nausea Only 12/19/2017   Sulfa antibiotics Swelling 08/14/2015    ROS:  General: Negative for anorexia, weight loss, fever, chills, fatigue, weakness. ENT: Negative for hoarseness, difficulty swallowing , nasal congestion. CV: Negative for chest pain, angina, palpitations, dyspnea on exertion, peripheral edema.  Respiratory: Negative for dyspnea at rest, dyspnea on exertion, cough, sputum, wheezing.  GI: See history of present illness. GU:  Negative for dysuria, hematuria, urinary incontinence, urinary frequency, nocturnal urination.  Endo: Negative for unusual weight change.    Physical Examination:   BP 121/81 (BP Location: Left Arm, Patient Position: Sitting, Cuff Size: Normal)    Pulse 83    Temp (!) 97 F (36.1 C) (Oral)    Wt 173 lb 4 oz (78.6 kg)    BMI 34.99 kg/m   General: Well-nourished, well-developed in no acute distress.  Eyes: No icterus. Conjunctivae pink. Mouth: Oropharyngeal mucosa moist and pink , no lesions erythema or exudate. Neck: Supple, Trachea midline Abdomen: Bowel sounds are normal, nontender, nondistended, no  hepatosplenomegaly or masses, no abdominal bruits or hernia , no rebound or guarding.   Extremities: No lower extremity edema. No clubbing or deformities. Neuro: Alert and oriented x 3.  Grossly intact. Skin: Warm and dry, no jaundice.   Psych: Alert and cooperative, normal mood and affect.   Labs: CMP     Component Value Date/Time   NA 139 08/06/2019 0521   NA 142 02/17/2013 1708   K 4.1 08/06/2019 0521   K 3.7 02/17/2013 1708   CL 105 08/06/2019 0521   CL 106 02/17/2013 1708   CO2 24 08/06/2019 0521   CO2 27 02/17/2013 1708   GLUCOSE 105 (H) 08/06/2019 0521   GLUCOSE 164 (H) 02/17/2013 1708   BUN 19 08/06/2019 0521   BUN 12 02/17/2013 1708  CREATININE 0.92 08/06/2019 0521   CREATININE 0.78 02/17/2013 1708   CALCIUM 9.2 08/06/2019 0521   CALCIUM 8.7 02/17/2013 1708   PROT 6.8 08/01/2019 1607   PROT 7.1 02/17/2013 1708   ALBUMIN 3.4 (L) 08/01/2019 1607   ALBUMIN 3.6 02/17/2013 1708   AST 20 08/01/2019 1607   AST 27 02/17/2013 1708   ALT 21 08/01/2019 1607   ALT 23 02/17/2013 1708   ALKPHOS 66 08/01/2019 1607   ALKPHOS 78 02/17/2013 1708   BILITOT 0.7 08/01/2019 1607   BILITOT 0.7 02/17/2013 1708   GFRNONAA 60 (L) 08/06/2019 0521   GFRNONAA >60 02/17/2013 1708   GFRAA >60 08/06/2019 0521   GFRAA >60 02/17/2013 1708   Lab Results  Component Value Date   WBC 9.4 08/06/2019   HGB 10.4 (L) 08/06/2019   HCT 35.8 (L) 08/06/2019   MCV 85.6 08/06/2019   PLT 352 08/06/2019    Imaging Studies: Ct Angio Chest Pe W/cm &/or Wo Cm  Result Date: 08/01/2019 CLINICAL DATA:  Shortness of breath, PE suspected EXAM: CT ANGIOGRAPHY CHEST WITH CONTRAST TECHNIQUE: Multidetector CT imaging of the chest was performed using the standard protocol during bolus administration of intravenous contrast. Multiplanar CT image reconstructions and MIPs were obtained to evaluate the vascular anatomy. CONTRAST:  OMNIPAQUE IOHEXOL 350 MG/ML SOLN COMPARISON:  CT 08/14/2015 FINDINGS:  Cardiovascular: Satisfactory opacification of the pulmonary arteries to the segmental level. There are extensive bilateral pulmonary emboli in the distal central, lobar and segmental arteries throughout both lungs. There is associated right heart strain with flattening of the intraventricular septum and elevation of the RV/LV ratio (1.33). There is right heart enlargement and reflux of contrast into the hepatic veins. Atherosclerotic calcifications of the normal caliber thoracic aorta. Normal branching of the aortic arch. Mediastinum/Nodes: No enlarged mediastinal, hilar or axillary lymph nodes. Thyroid gland and thoracic inlet are unremarkable. No acute abnormality of the trachea side from posterior bowing which is seen with imaging during exhalation. There is a large hiatal hernia. Lungs/Pleura: Mosaic attenuation of the lungs can be seen in the setting of imaging during exhalation. There are diminished lung volumes with basilar areas of bandlike opacity likely reflecting atelectasis and/or scarring. No consolidation, features of edema, pneumothorax, or effusion. No suspicious pulmonary nodules or masses. Upper Abdomen: Patient is post cholecystectomy. There is a calcified 1.1 cm splenic artery aneurysm which is unchanged since 2016. No acute abnormalities present in the visualized portions of the upper abdomen. Scattered colonic diverticula without focal pericolonic inflammation to suggest diverticulitis. Musculoskeletal: Stable compression deformity and retropulsion of the T12 vertebral body no acute or suspicious osseous lesions. No concerning chest wall lesions. Review of the MIP images confirms the above findings. IMPRESSION: 1. Extensive bilateral pulmonary emboli in the distal central, lobar and segmental arteries throughout both lungs with evidence of right heart strain. Positive for acute PE with CT evidence of right heart strain (RV/LV Ratio = 1.33) consistent with at least submassive (intermediate risk)  PE. The presence of right heart strain has been associated with an increased risk of morbidity and mortality. Please activate Code PE by paging [phone]. 2. Large hiatal hernia. 3. Aortic Atherosclerosis (ICD10-I70.0). 4. 1.1 cm splenic artery aneurysm. Finding is unchanged since 2016 and has a low overall risk of rupture. Could consider surveillance imaging on an annual basis. Recommendation is based on the consensus guidelines: Managing incidental findings on abdominal and pelvic CT and MRI, Part 2: white paper of the ACR Incidental Findings Committee II on vascular  findings. J Am Coll Radiol. 2013;10 (10): 299-37. doi:10.1016/j.jacr.2013.05.021 Critical Value/emergent results were called by telephone at the time of interpretation on 08/01/2019 at 7:17 pm to providerDr Sedonia Small, who verbally acknowledged these results. Electronically Signed   By: Lovena Le M.D.   On: 08/01/2019 19:19   Dg Chest Port 1 View  Result Date: 08/05/2019 CLINICAL DATA:  Cough and shortness of breath. EXAM: PORTABLE CHEST 1 VIEW COMPARISON:  08/01/2019 FINDINGS: Stable mild cardiomegaly. A large hiatal hernia is seen. Both lungs are clear. IMPRESSION: Stable mild cardiomegaly. No active lung disease. Large hiatal hernia. Electronically Signed   By: Marlaine Hind M.D.   On: 08/05/2019 17:17   Dg Chest Port 1 View  Result Date: 08/01/2019 CLINICAL DATA:  78 year old female with history of shortness of breath and generalized weakness. EXAM: PORTABLE CHEST 1 VIEW COMPARISON:  Chest x-ray 02/17/2013. FINDINGS: Lung volumes are normal. No consolidative airspace disease. No pleural effusions. No evidence of pulmonary edema. No pneumothorax. No definite suspicious appearing pulmonary nodules or masses are noted. Moderate cardiomegaly. Upper mediastinal contours are within normal limits. Aortic atherosclerosis. Large hiatal hernia. IMPRESSION: 1. No radiographic evidence of acute cardiopulmonary disease. 2. Moderate cardiomegaly. 3.  Aortic atherosclerosis. 4. Large hiatal hernia. Electronically Signed   By: Vinnie Langton M.D.   On: 08/01/2019 16:48   Vas [place] Lower Extremity Venous (dvt) (mc And Wl 7a-7p)  Result Date: 08/02/2019  Lower Venous Study Indications: Swelling, and Pain.  Comparison Study: no prior Performing Technologist: Abram Sander RVS  Examination Guidelines: A complete evaluation includes B-mode imaging, spectral Doppler, color Doppler, and power Doppler as needed of all accessible portions of each vessel. Bilateral testing is considered an integral part of a complete examination. Limited examinations for reoccurring indications may be performed as noted.  +---------+---------------+---------+-----------+----------+--------------+  LEFT      Compressibility Phasicity Spontaneity Properties Thrombus Aging  +---------+---------------+---------+-----------+----------+--------------+  CFV       None            No        No                     Acute           +---------+---------------+---------+-----------+----------+--------------+  SFJ       None                                             Acute           +---------+---------------+---------+-----------+----------+--------------+  FV Prox   None                                             Acute           +---------+---------------+---------+-----------+----------+--------------+  FV Mid    None                                             Acute           +---------+---------------+---------+-----------+----------+--------------+  FV Distal None  Acute           +---------+---------------+---------+-----------+----------+--------------+  PFV       None                                             Acute           +---------+---------------+---------+-----------+----------+--------------+  POP       None            No        No                     Acute            +---------+---------------+---------+-----------+----------+--------------+  PTV       None                                             Acute           +---------+---------------+---------+-----------+----------+--------------+  PERO                                                       Not visualized  +---------+---------------+---------+-----------+----------+--------------+     Summary: Left: Findings consistent with acute deep vein thrombosis involving the left common femoral vein, left femoral vein, left proximal profunda vein, left popliteal vein, and left posterior tibial veins. No cystic structure found in the popliteal fossa.  *See table(s) above for measurements and observations. Electronically signed by Lemar Livings MD on 08/02/2019 at 4:31:32 PM.    Final     Assessment and Plan:   ADDI PAK is a 78 y.o. y/o female here for posthospitalization follow-up for anemia and melena, and gastric erosions and esophagitis  Treat H. pylori with triple therapy, with substitution of amoxicillin for metronidazole due to penicillin allergy  Patient instructed to follow-up with her [facility] for eradication testing.  Daughter present with her and information discussed with the daughter in detail as well  I have asked them to send [place] a record release request from the new GIs office once they establish care there, so we can send them our records.  They have not chosen a office or a physician in [place] yet, but will be doing so shortly.  No indication for urgent endoscopy at this time, and timeline of any future endoscopies or colonoscopies (for history of polyps) can be determined by their new GI after reviewing all previous records, weighing risks and benefits of the procedures in detail at the time of her clinic visits with them  Her lower abdominal pain is very consistent with bowel movement or constipation pain.  Since she is having soft bowel movements daily, and is about to start  taking antibiotics for H. pylori, we will not start new medications at this time as the antibiotics itself can cause some diarrhea and bowel movement changes.  If pain continues I have asked her to discuss this with her new GI as well.  No alarm features present.  Avoid NSAID use such as Ibuprofen, Aleeve, advil, motrin, BC and Goodie powder, Naproxen, Meloxicam and others.  Over 50% of the time, of 30 minute encounter spent in counseling patient and family about the above   Dr Cheryl Goodman

## 2019-08-17 ENCOUNTER — Telehealth: Payer: Self-pay | Admitting: Gastroenterology

## 2019-08-17 DIAGNOSIS — I82442 Acute embolism and thrombosis of left tibial vein: Secondary | ICD-10-CM | POA: Diagnosis not present

## 2019-08-17 DIAGNOSIS — I119 Hypertensive heart disease without heart failure: Secondary | ICD-10-CM | POA: Diagnosis not present

## 2019-08-17 DIAGNOSIS — I2699 Other pulmonary embolism without acute cor pulmonale: Secondary | ICD-10-CM | POA: Diagnosis not present

## 2019-08-17 DIAGNOSIS — I82412 Acute embolism and thrombosis of left femoral vein: Secondary | ICD-10-CM | POA: Diagnosis not present

## 2019-08-17 DIAGNOSIS — K2100 Gastro-esophageal reflux disease with esophagitis, without bleeding: Secondary | ICD-10-CM | POA: Diagnosis not present

## 2019-08-17 DIAGNOSIS — I700 Atherosclerosis of aorta: Secondary | ICD-10-CM | POA: Diagnosis not present

## 2019-08-17 DIAGNOSIS — I2510 Atherosclerotic heart disease of native coronary artery without angina pectoris: Secondary | ICD-10-CM | POA: Diagnosis not present

## 2019-08-17 DIAGNOSIS — K449 Diaphragmatic hernia without obstruction or gangrene: Secondary | ICD-10-CM | POA: Diagnosis not present

## 2019-08-17 DIAGNOSIS — I82432 Acute embolism and thrombosis of left popliteal vein: Secondary | ICD-10-CM | POA: Diagnosis not present

## 2019-08-17 NOTE — Telephone Encounter (Signed)
Patient called & l/m on v/m stating she was having trouble with the antibiotic prescribed for her. She tries to vomit but is unable to. She is very sick on her stomach & needs something to help.

## 2019-08-17 NOTE — Telephone Encounter (Signed)
Patient states she is nausea with the abdominal pain but feels like she is going to throw up but she can not throw up. Patient is on Flagyl and Biaxin. Patient states she has been eating with the medications

## 2019-08-20 DIAGNOSIS — G4733 Obstructive sleep apnea (adult) (pediatric): Secondary | ICD-10-CM | POA: Diagnosis not present

## 2019-08-20 NOTE — Telephone Encounter (Signed)
Patient states she stopped the medication Friday because she could not stand being so nausea. Patient states she was eating small meals with the medication with each does.

## 2019-08-21 DIAGNOSIS — I2510 Atherosclerotic heart disease of native coronary artery without angina pectoris: Secondary | ICD-10-CM | POA: Diagnosis not present

## 2019-08-21 DIAGNOSIS — K449 Diaphragmatic hernia without obstruction or gangrene: Secondary | ICD-10-CM | POA: Diagnosis not present

## 2019-08-21 DIAGNOSIS — K2100 Gastro-esophageal reflux disease with esophagitis, without bleeding: Secondary | ICD-10-CM | POA: Diagnosis not present

## 2019-08-21 DIAGNOSIS — I82442 Acute embolism and thrombosis of left tibial vein: Secondary | ICD-10-CM | POA: Diagnosis not present

## 2019-08-21 DIAGNOSIS — I82412 Acute embolism and thrombosis of left femoral vein: Secondary | ICD-10-CM | POA: Diagnosis not present

## 2019-08-21 DIAGNOSIS — I2699 Other pulmonary embolism without acute cor pulmonale: Secondary | ICD-10-CM | POA: Diagnosis not present

## 2019-08-21 DIAGNOSIS — I119 Hypertensive heart disease without heart failure: Secondary | ICD-10-CM | POA: Diagnosis not present

## 2019-08-21 DIAGNOSIS — I82432 Acute embolism and thrombosis of left popliteal vein: Secondary | ICD-10-CM | POA: Diagnosis not present

## 2019-08-21 DIAGNOSIS — I700 Atherosclerosis of aorta: Secondary | ICD-10-CM | POA: Diagnosis not present

## 2019-08-22 ENCOUNTER — Other Ambulatory Visit: Payer: Self-pay

## 2019-08-22 NOTE — Patient Outreach (Signed)
[facility]) Care Management  [place]  08/22/2019   Cheryl Goodman Feb 03, 1941 [id number]  Subjective: Telephone call to patient for follow up.  She reports she is doing so much better.  She states that she is getting stronger daily. She reports that PT is involved twice a week right now. Patient H. Pylori test was positive and patient was on H. Pylori medication but state as the medication made her so sick and that she stopped taking the medication and has spoken with the GI physicians assistant on Monday but has not heard anything further from the MD.  Discussed H. Pylori and that is can cause some gastritis and that patient having history of ulcers and recent GI bleed that she probably needs to be on something.  She verbalized understanding.  Patient denies any needs at this time and appreciative of call.    Objective:   Encounter Medications:  Outpatient Encounter Medications as of 08/22/2019  Medication Sig  . apixaban (ELIQUIS) 5 MG TABS tablet Take 1 tablet (5 mg total) by mouth 2 (two) times daily.  . Biotin 1000 MCG tablet Take 1,000 mcg by mouth daily.  . Cholecalciferol (VITAMIN D3) 125 MCG (5000 UT) TABS Take 5,000 Units by mouth daily.  . clarithromycin (BIAXIN) 250 MG tablet Take 2 tablets (500 mg total) by mouth 2 (two) times daily for 14 days.  . Iron-Vitamin C (VITRON-C) 65-125 MG TABS Take 1 tablet by mouth daily.  . metroNIDAZOLE (FLAGYL) 500 MG tablet Take 1 tablet (500 mg total) by mouth 3 (three) times daily for 14 days.  Marland Kitchen omeprazole (PRILOSEC) 40 MG capsule Take 40 mg by mouth 2 (two) times daily.  Marland Kitchen omeprazole (PRILOSEC) 40 MG capsule Take 1 capsule (40 mg total) by mouth 2 (two) times daily for 14 days.  . polyethylene glycol (MIRALAX / GLYCOLAX) 17 g packet Take 17 g by mouth daily.  . sucralfate (CARAFATE) 1 g tablet Take 1 g by mouth 2 (two) times daily.  . traMADol (ULTRAM) 50 MG tablet Take 50 mg by mouth every 6 (six) hours as  needed for moderate pain.   No facility-administered encounter medications on file as of 08/22/2019.     Functional Status:  In your present state of health, do you have any difficulty performing the following activities: 08/01/2019 07/24/2019  Hearing? N N  Vision? N N  Difficulty concentrating or making decisions? N N  Walking or climbing stairs? Y Y  Comment weakness weakness  Dressing or bathing? N N  Doing errands, shopping? N N  Conservation officer, nature and eating ? - N  Using the Toilet? - N  In the past six months, have you accidently leaked urine? - N  Managing your Medications? - N  Managing your Finances? - N  Housekeeping or managing your Housekeeping? - N  Some recent data might be hidden    Fall/Depression Screening: Fall Risk  07/24/2019  Falls in the past year? 0   PHQ 2/9 Scores 07/24/2019  PHQ - 2 Score 1    Assessment: Patient recovering from recent hospitalizations.  Patient going to scheduled appointments and active with PT.    Plan:  [facility] CM Care Plan Problem One     Most Recent Value  Care Plan Problem One  Recent hospitalization Anemia/ GI Bleed and re-hospitalization related to DVT.    Role Documenting the Problem One  Care Management Telephonic [place] for Problem One  Active  [facility] Long  Term Goal   Patient will not readmit to hospital for GI Bleed or DVT within 31 days.  THN Long Term Goal Start Date  08/07/19  Interventions for Problem One Long Term Goal  Patient participating in PT. Patient getting back active. No signs of GI bleed, shortness of breath, or leg pain.    THN CM Short Term Goal #1   Patient will schedule follow up with GI physician within 14 days.  THN CM Short Term Goal #1 Start Date  07/24/19  THN CM Short Term Goal #1 Met Date  08/22/19  THN CM Short Term Goal #2   Patient will report improvement in ability to perform activites of daily living within 14 days.  THN CM Short Term Goal #2 Start Date  08/07/19  Interventions  for Short Term Goal #2  Patient exwercising and building strength daily. Patient working with PT.       RN CM will contact patient again in the next two weeks and patient agreeable.    Jone Baseman, RN, MSN [place] Management Care Management Coordinator Direct Line [phone] Cell [phone] Toll Free: [phone]  Fax: [phone]

## 2019-08-24 DIAGNOSIS — I82412 Acute embolism and thrombosis of left femoral vein: Secondary | ICD-10-CM | POA: Diagnosis not present

## 2019-08-24 DIAGNOSIS — I82432 Acute embolism and thrombosis of left popliteal vein: Secondary | ICD-10-CM | POA: Diagnosis not present

## 2019-08-24 DIAGNOSIS — I2699 Other pulmonary embolism without acute cor pulmonale: Secondary | ICD-10-CM | POA: Diagnosis not present

## 2019-08-24 DIAGNOSIS — K2100 Gastro-esophageal reflux disease with esophagitis, without bleeding: Secondary | ICD-10-CM | POA: Diagnosis not present

## 2019-08-24 DIAGNOSIS — I700 Atherosclerosis of aorta: Secondary | ICD-10-CM | POA: Diagnosis not present

## 2019-08-24 DIAGNOSIS — I82442 Acute embolism and thrombosis of left tibial vein: Secondary | ICD-10-CM | POA: Diagnosis not present

## 2019-08-24 DIAGNOSIS — I119 Hypertensive heart disease without heart failure: Secondary | ICD-10-CM | POA: Diagnosis not present

## 2019-08-24 DIAGNOSIS — K449 Diaphragmatic hernia without obstruction or gangrene: Secondary | ICD-10-CM | POA: Diagnosis not present

## 2019-08-24 DIAGNOSIS — I2510 Atherosclerotic heart disease of native coronary artery without angina pectoris: Secondary | ICD-10-CM | POA: Diagnosis not present

## 2019-08-28 DIAGNOSIS — K2100 Gastro-esophageal reflux disease with esophagitis, without bleeding: Secondary | ICD-10-CM | POA: Diagnosis not present

## 2019-08-28 DIAGNOSIS — I2699 Other pulmonary embolism without acute cor pulmonale: Secondary | ICD-10-CM | POA: Diagnosis not present

## 2019-08-28 DIAGNOSIS — I700 Atherosclerosis of aorta: Secondary | ICD-10-CM | POA: Diagnosis not present

## 2019-08-28 DIAGNOSIS — I82432 Acute embolism and thrombosis of left popliteal vein: Secondary | ICD-10-CM | POA: Diagnosis not present

## 2019-08-28 DIAGNOSIS — I82442 Acute embolism and thrombosis of left tibial vein: Secondary | ICD-10-CM | POA: Diagnosis not present

## 2019-08-28 DIAGNOSIS — K449 Diaphragmatic hernia without obstruction or gangrene: Secondary | ICD-10-CM | POA: Diagnosis not present

## 2019-08-28 DIAGNOSIS — I119 Hypertensive heart disease without heart failure: Secondary | ICD-10-CM | POA: Diagnosis not present

## 2019-08-28 DIAGNOSIS — I2510 Atherosclerotic heart disease of native coronary artery without angina pectoris: Secondary | ICD-10-CM | POA: Diagnosis not present

## 2019-08-28 DIAGNOSIS — I82412 Acute embolism and thrombosis of left femoral vein: Secondary | ICD-10-CM | POA: Diagnosis not present

## 2019-08-29 DIAGNOSIS — I2510 Atherosclerotic heart disease of native coronary artery without angina pectoris: Secondary | ICD-10-CM | POA: Diagnosis not present

## 2019-08-29 DIAGNOSIS — I82442 Acute embolism and thrombosis of left tibial vein: Secondary | ICD-10-CM | POA: Diagnosis not present

## 2019-08-29 DIAGNOSIS — I2699 Other pulmonary embolism without acute cor pulmonale: Secondary | ICD-10-CM | POA: Diagnosis not present

## 2019-08-29 DIAGNOSIS — K449 Diaphragmatic hernia without obstruction or gangrene: Secondary | ICD-10-CM | POA: Diagnosis not present

## 2019-08-29 DIAGNOSIS — I700 Atherosclerosis of aorta: Secondary | ICD-10-CM | POA: Diagnosis not present

## 2019-08-29 DIAGNOSIS — K2100 Gastro-esophageal reflux disease with esophagitis, without bleeding: Secondary | ICD-10-CM | POA: Diagnosis not present

## 2019-08-29 DIAGNOSIS — I82432 Acute embolism and thrombosis of left popliteal vein: Secondary | ICD-10-CM | POA: Diagnosis not present

## 2019-08-29 DIAGNOSIS — I119 Hypertensive heart disease without heart failure: Secondary | ICD-10-CM | POA: Diagnosis not present

## 2019-08-29 DIAGNOSIS — I82412 Acute embolism and thrombosis of left femoral vein: Secondary | ICD-10-CM | POA: Diagnosis not present

## 2019-09-03 DIAGNOSIS — Z0000 Encounter for general adult medical examination without abnormal findings: Secondary | ICD-10-CM | POA: Diagnosis not present

## 2019-09-03 DIAGNOSIS — R488 Other symbolic dysfunctions: Secondary | ICD-10-CM | POA: Diagnosis not present

## 2019-09-03 DIAGNOSIS — Z86711 Personal history of pulmonary embolism: Secondary | ICD-10-CM | POA: Diagnosis not present

## 2019-09-03 DIAGNOSIS — K2960 Other gastritis without bleeding: Secondary | ICD-10-CM | POA: Diagnosis not present

## 2019-09-03 DIAGNOSIS — D500 Iron deficiency anemia secondary to blood loss (chronic): Secondary | ICD-10-CM | POA: Diagnosis not present

## 2019-09-03 DIAGNOSIS — M5116 Intervertebral disc disorders with radiculopathy, lumbar region: Secondary | ICD-10-CM | POA: Diagnosis not present

## 2019-09-03 DIAGNOSIS — B9681 Helicobacter pylori [H. pylori] as the cause of diseases classified elsewhere: Secondary | ICD-10-CM | POA: Diagnosis not present

## 2019-09-05 ENCOUNTER — Other Ambulatory Visit: Payer: Self-pay

## 2019-09-05 NOTE — Patient Outreach (Signed)
[facility]) Care Management  [place]  09/05/2019   Cheryl Goodman 04/13/1941 [id number]  Subjective: Telephone call to patient for follow up. Patient states she is doing good.  She reports being back to her normal duties at home and that her Hemoglobin was 12.4.  Discussed with patient GI bleed and pulmonary embolism.  Patient knows symptoms and when to notify physician.  Patient denies any questions or concerns.  Objective:   Encounter Medications:  Outpatient Encounter Medications as of 09/05/2019  Medication Sig  . apixaban (ELIQUIS) 5 MG TABS tablet Take 1 tablet (5 mg total) by mouth 2 (two) times daily.  . Biotin 1000 MCG tablet Take 1,000 mcg by mouth daily.  . Cholecalciferol (VITAMIN D3) 125 MCG (5000 UT) TABS Take 5,000 Units by mouth daily.  . Iron-Vitamin C (VITRON-C) 65-125 MG TABS Take 1 tablet by mouth daily.  Marland Kitchen omeprazole (PRILOSEC) 40 MG capsule Take 40 mg by mouth 2 (two) times daily.  Marland Kitchen omeprazole (PRILOSEC) 40 MG capsule Take 1 capsule (40 mg total) by mouth 2 (two) times daily for 14 days.  . polyethylene glycol (MIRALAX / GLYCOLAX) 17 g packet Take 17 g by mouth daily.  . sucralfate (CARAFATE) 1 g tablet Take 1 g by mouth 2 (two) times daily.  . traMADol (ULTRAM) 50 MG tablet Take 50 mg by mouth every 6 (six) hours as needed for moderate pain.   No facility-administered encounter medications on file as of 09/05/2019.     Functional Status:  In your present state of health, do you have any difficulty performing the following activities: 08/01/2019 07/24/2019  Hearing? N N  Vision? N N  Difficulty concentrating or making decisions? N N  Walking or climbing stairs? Y Y  Comment weakness weakness  Dressing or bathing? N N  Doing errands, shopping? N N  Conservation officer, nature and eating ? - N  Using the Toilet? - N  In the past six months, have you accidently leaked urine? - N  Managing your Medications? - N  Managing your Finances? - N   Housekeeping or managing your Housekeeping? - N  Some recent data might be hidden    Fall/Depression Screening: Fall Risk  07/24/2019  Falls in the past year? 0   PHQ 2/9 Scores 07/24/2019  PHQ - 2 Score 1    Assessment:  Patient continues to recover from both hospitalizations and is managing illnesses.    Plan:    [facility] CM Care Plan Problem One     Most Recent Value  Care Plan Problem One  Recent hospitalization Anemia/ GI Bleed and re-hospitalization related to DVT.    Role Documenting the Problem One  Care Management Telephonic [place] for Problem One  Active  [facility] Long Term Goal   Patient will not readmit to hospital for GI Bleed or DVT within 31 days.  THN Long Term Goal Start Date  08/07/19  Interventions for Problem One Long Term Goal  PT complete and patient feeling better. Discussed signs of GI bleed and pulmonary embolism with patient.   THN CM Short Term Goal #2   Patient will report improvement in ability to perform activites of daily living within 14 days.  THN CM Short Term Goal #2 Start Date  08/07/19  [facility] CM Short Term Goal #2 Met Date  09/05/19     RN CM will contact patient next month and patient agreeable.   Jone Baseman, RN, MSN [facility] Care  Management Care Management Coordinator Direct Line [phone] Cell [phone] Toll Free: [phone]  Fax: [phone]

## 2019-09-11 DIAGNOSIS — K2100 Gastro-esophageal reflux disease with esophagitis, without bleeding: Secondary | ICD-10-CM | POA: Diagnosis not present

## 2019-09-11 DIAGNOSIS — I82442 Acute embolism and thrombosis of left tibial vein: Secondary | ICD-10-CM | POA: Diagnosis not present

## 2019-09-11 DIAGNOSIS — I82432 Acute embolism and thrombosis of left popliteal vein: Secondary | ICD-10-CM | POA: Diagnosis not present

## 2019-09-11 DIAGNOSIS — I82412 Acute embolism and thrombosis of left femoral vein: Secondary | ICD-10-CM | POA: Diagnosis not present

## 2019-09-11 DIAGNOSIS — I700 Atherosclerosis of aorta: Secondary | ICD-10-CM | POA: Diagnosis not present

## 2019-09-11 DIAGNOSIS — I2699 Other pulmonary embolism without acute cor pulmonale: Secondary | ICD-10-CM | POA: Diagnosis not present

## 2019-09-11 DIAGNOSIS — I119 Hypertensive heart disease without heart failure: Secondary | ICD-10-CM | POA: Diagnosis not present

## 2019-09-11 DIAGNOSIS — I2510 Atherosclerotic heart disease of native coronary artery without angina pectoris: Secondary | ICD-10-CM | POA: Diagnosis not present

## 2019-09-12 NOTE — Telephone Encounter (Signed)
Patient verbalized understanding  

## 2019-09-18 ENCOUNTER — Ambulatory Visit: Admit: 2019-09-18 | Payer: Medicare Other | Admitting: Internal Medicine

## 2019-09-18 SURGERY — ESOPHAGOGASTRODUODENOSCOPY (EGD) WITH PROPOFOL
Anesthesia: General

## 2019-09-19 DIAGNOSIS — G4733 Obstructive sleep apnea (adult) (pediatric): Secondary | ICD-10-CM | POA: Diagnosis not present

## 2019-10-09 ENCOUNTER — Other Ambulatory Visit: Payer: Self-pay

## 2019-10-09 NOTE — Patient Outreach (Addendum)
[facility]) Care Management  [place]  10/09/2019   Cheryl Goodman Mar 08, 1941 [id number]  Subjective: Telephone call to patient for follow up.  Patient reports doing really good.  She is doing her normal things now. She reports that hemoglobin last check was a 12.4.  Discussed anemia and GI bleed.  She verbalized understanding and voices no concerns.      Objective:   Encounter Medications:  Outpatient Encounter Medications as of 10/09/2019  Medication Sig  . apixaban (ELIQUIS) 5 MG TABS tablet Take 1 tablet (5 mg total) by mouth 2 (two) times daily.  . Biotin 1000 MCG tablet Take 1,000 mcg by mouth daily.  . Cholecalciferol (VITAMIN D3) 125 MCG (5000 UT) TABS Take 5,000 Units by mouth daily.  . Iron-Vitamin C (VITRON-C) 65-125 MG TABS Take 1 tablet by mouth daily.  Marland Kitchen omeprazole (PRILOSEC) 40 MG capsule Take 40 mg by mouth 2 (two) times daily.  Marland Kitchen omeprazole (PRILOSEC) 40 MG capsule Take 1 capsule (40 mg total) by mouth 2 (two) times daily for 14 days.  . polyethylene glycol (MIRALAX / GLYCOLAX) 17 g packet Take 17 g by mouth daily.  . sucralfate (CARAFATE) 1 g tablet Take 1 g by mouth 2 (two) times daily.  . traMADol (ULTRAM) 50 MG tablet Take 50 mg by mouth every 6 (six) hours as needed for moderate pain.   No facility-administered encounter medications on file as of 10/09/2019.    Functional Status:  In your present state of health, do you have any difficulty performing the following activities: 08/01/2019 07/24/2019  Hearing? N N  Vision? N N  Difficulty concentrating or making decisions? N N  Walking or climbing stairs? Y Y  Comment weakness weakness  Dressing or bathing? N N  Doing errands, shopping? N N  Conservation officer, nature and eating ? - N  Using the Toilet? - N  In the past six months, have you accidently leaked urine? - N  Managing your Medications? - N  Managing your Finances? - N  Housekeeping or managing your Housekeeping? - N  Some recent  data might be hidden    Fall/Depression Screening: Fall Risk  07/24/2019  Falls in the past year? 0   PHQ 2/9 Scores 07/24/2019  PHQ - 2 Score 1    Assessment: Patient continues to manage disease process.   Plan:  [facility] CM Care Plan Problem One     Most Recent Value  Care Plan Problem One  Recent hospitalization Anemia/ GI Bleed and re-hospitalization related to DVT.    Role Documenting the Problem One  Care Management Telephonic Coordinator  Care Plan for Problem One  Active  [facility] Long Term Goal   Patient will keep hemoglobin greater than 12 over the next 90 days.    THN Long Term Goal Start Date  10/09/19  THN Long Term Goal Met Date  10/09/19  Interventions for Problem One Long Term Goal  Discussed overall status with patient. Importance of having hemoglobin monitored.  Discussed signs of GI bleed and importance to seek medical attention.    THN CM Short Term Goal #2   Patient will report improvement in ability to perform activites of daily living within 14 days.  THN CM Short Term Goal #2 Start Date  08/07/19  [facility] CM Short Term Goal #2 Met Date  09/05/19     RN CM will contact patient in the month of April for quarterly check in and patient agreeable.  Yosiah Jasmin J  Dia Sitter RN, MSN [place] Management Care Management Coordinator Direct Line [phone] Cell [phone] Toll Free: [phone]  Fax: [phone]

## 2019-10-10 ENCOUNTER — Ambulatory Visit: Payer: Self-pay

## 2019-10-15 DIAGNOSIS — M8580 Other specified disorders of bone density and structure, unspecified site: Secondary | ICD-10-CM | POA: Diagnosis not present

## 2019-10-15 DIAGNOSIS — Z7901 Long term (current) use of anticoagulants: Secondary | ICD-10-CM | POA: Diagnosis not present

## 2019-10-15 DIAGNOSIS — I2699 Other pulmonary embolism without acute cor pulmonale: Secondary | ICD-10-CM | POA: Diagnosis not present

## 2019-10-15 DIAGNOSIS — K2960 Other gastritis without bleeding: Secondary | ICD-10-CM | POA: Diagnosis not present

## 2019-10-15 DIAGNOSIS — E559 Vitamin D deficiency, unspecified: Secondary | ICD-10-CM | POA: Insufficient documentation

## 2019-10-15 DIAGNOSIS — M810 Age-related osteoporosis without current pathological fracture: Secondary | ICD-10-CM | POA: Diagnosis not present

## 2019-10-15 DIAGNOSIS — Z0000 Encounter for general adult medical examination without abnormal findings: Secondary | ICD-10-CM | POA: Diagnosis not present

## 2019-10-20 DIAGNOSIS — G4733 Obstructive sleep apnea (adult) (pediatric): Secondary | ICD-10-CM | POA: Diagnosis not present

## 2019-11-07 ENCOUNTER — Ambulatory Visit: Payer: Self-pay

## 2019-11-20 DIAGNOSIS — G4733 Obstructive sleep apnea (adult) (pediatric): Secondary | ICD-10-CM | POA: Diagnosis not present

## 2019-12-18 DIAGNOSIS — G4733 Obstructive sleep apnea (adult) (pediatric): Secondary | ICD-10-CM | POA: Diagnosis not present

## 2020-01-08 ENCOUNTER — Other Ambulatory Visit: Payer: Self-pay

## 2020-01-08 NOTE — Patient Outreach (Signed)
[facility]) Care Management  01/08/2020  GLEMA TAKAKI [birth year] [id number]   Telephone call to patient for quarterly check in.  No answer.  HIPAA compliant voice message left.   Plan: RN CM will wait return call. If no return call will attempt again in the month of July and send letter.  Bary Leriche, RN, MSN [facility] Care Management Care Management Coordinator Direct Line [phone] Cell [phone] Toll Free: [phone]  Fax: [phone]

## 2020-01-09 DIAGNOSIS — H25011 Cortical age-related cataract, right eye: Secondary | ICD-10-CM | POA: Diagnosis not present

## 2020-01-09 DIAGNOSIS — H04123 Dry eye syndrome of bilateral lacrimal glands: Secondary | ICD-10-CM | POA: Diagnosis not present

## 2020-01-09 DIAGNOSIS — H2511 Age-related nuclear cataract, right eye: Secondary | ICD-10-CM | POA: Diagnosis not present

## 2020-01-09 DIAGNOSIS — H0100A Unspecified blepharitis right eye, upper and lower eyelids: Secondary | ICD-10-CM | POA: Diagnosis not present

## 2020-01-10 DIAGNOSIS — D500 Iron deficiency anemia secondary to blood loss (chronic): Secondary | ICD-10-CM | POA: Diagnosis not present

## 2020-01-10 DIAGNOSIS — I2601 Septic pulmonary embolism with acute cor pulmonale: Secondary | ICD-10-CM | POA: Diagnosis not present

## 2020-01-10 DIAGNOSIS — M818 Other osteoporosis without current pathological fracture: Secondary | ICD-10-CM | POA: Diagnosis not present

## 2020-01-10 DIAGNOSIS — E782 Mixed hyperlipidemia: Secondary | ICD-10-CM | POA: Diagnosis not present

## 2020-01-14 DIAGNOSIS — R1032 Left lower quadrant pain: Secondary | ICD-10-CM | POA: Diagnosis not present

## 2020-01-14 DIAGNOSIS — K2900 Acute gastritis without bleeding: Secondary | ICD-10-CM | POA: Diagnosis not present

## 2020-01-14 DIAGNOSIS — M4690 Unspecified inflammatory spondylopathy, site unspecified: Secondary | ICD-10-CM | POA: Diagnosis not present

## 2020-01-14 DIAGNOSIS — R1031 Right lower quadrant pain: Secondary | ICD-10-CM | POA: Diagnosis not present

## 2020-01-18 DIAGNOSIS — G4733 Obstructive sleep apnea (adult) (pediatric): Secondary | ICD-10-CM | POA: Diagnosis not present

## 2020-02-14 DIAGNOSIS — J209 Acute bronchitis, unspecified: Secondary | ICD-10-CM | POA: Diagnosis not present

## 2020-02-14 DIAGNOSIS — Z20822 Contact with and (suspected) exposure to covid-19: Secondary | ICD-10-CM | POA: Diagnosis not present

## 2020-02-14 DIAGNOSIS — J40 Bronchitis, not specified as acute or chronic: Secondary | ICD-10-CM | POA: Diagnosis not present

## 2020-02-17 DIAGNOSIS — G4733 Obstructive sleep apnea (adult) (pediatric): Secondary | ICD-10-CM | POA: Diagnosis not present

## 2020-03-19 DIAGNOSIS — G4733 Obstructive sleep apnea (adult) (pediatric): Secondary | ICD-10-CM | POA: Diagnosis not present

## 2020-04-03 ENCOUNTER — Other Ambulatory Visit: Payer: Self-pay

## 2020-04-03 NOTE — Patient Outreach (Signed)
[facility]) Care Management  04/03/2020  COTY STUDENT 1940-11-25 [id number]   Telephone call to patient for disease management follow up. No answer. Unable to leave a message.  Plan: RN CM will attempt patient again in the month of October.    Bary Leriche, RN, MSN [facility] Care Management Care Management Coordinator Direct Line [phone] Cell [phone] Toll Free: [phone]  Fax: [phone]

## 2020-04-08 DIAGNOSIS — H527 Unspecified disorder of refraction: Secondary | ICD-10-CM | POA: Diagnosis not present

## 2020-04-08 DIAGNOSIS — Z961 Presence of intraocular lens: Secondary | ICD-10-CM | POA: Diagnosis not present

## 2020-04-08 DIAGNOSIS — H25811 Combined forms of age-related cataract, right eye: Secondary | ICD-10-CM | POA: Diagnosis not present

## 2020-04-08 DIAGNOSIS — H26492 Other secondary cataract, left eye: Secondary | ICD-10-CM | POA: Diagnosis not present

## 2020-04-10 DIAGNOSIS — H52221 Regular astigmatism, right eye: Secondary | ICD-10-CM | POA: Diagnosis not present

## 2020-04-10 DIAGNOSIS — H5213 Myopia, bilateral: Secondary | ICD-10-CM | POA: Diagnosis not present

## 2020-04-18 DIAGNOSIS — D500 Iron deficiency anemia secondary to blood loss (chronic): Secondary | ICD-10-CM | POA: Diagnosis not present

## 2020-04-18 DIAGNOSIS — G4733 Obstructive sleep apnea (adult) (pediatric): Secondary | ICD-10-CM | POA: Diagnosis not present

## 2020-04-18 DIAGNOSIS — I2601 Septic pulmonary embolism with acute cor pulmonale: Secondary | ICD-10-CM | POA: Diagnosis not present

## 2020-04-25 DIAGNOSIS — L259 Unspecified contact dermatitis, unspecified cause: Secondary | ICD-10-CM | POA: Diagnosis not present

## 2020-04-25 DIAGNOSIS — K219 Gastro-esophageal reflux disease without esophagitis: Secondary | ICD-10-CM | POA: Diagnosis not present

## 2020-04-25 DIAGNOSIS — I2609 Other pulmonary embolism with acute cor pulmonale: Secondary | ICD-10-CM | POA: Diagnosis not present

## 2020-07-04 ENCOUNTER — Other Ambulatory Visit: Payer: Self-pay

## 2020-07-04 NOTE — Patient Outreach (Signed)
[facility]) Care Management  07/04/2020  Cheryl Goodman November 19, 1940 [id number]   Telephone call to patient for disease management follow up.   No answer.  HIPAA compliant voice message left.    Plan: If no return call, RN CM will attempt patient again in the month of January.  Bary Leriche, RN, MSN [facility] Care Management Care Management Coordinator Direct Line [phone] Cell [phone] Toll Free: [phone]  Fax: [phone]

## 2020-08-26 DIAGNOSIS — I2609 Other pulmonary embolism with acute cor pulmonale: Secondary | ICD-10-CM | POA: Diagnosis not present

## 2020-08-26 DIAGNOSIS — E782 Mixed hyperlipidemia: Secondary | ICD-10-CM | POA: Diagnosis not present

## 2020-09-02 DIAGNOSIS — E782 Mixed hyperlipidemia: Secondary | ICD-10-CM | POA: Diagnosis not present

## 2020-09-02 DIAGNOSIS — K219 Gastro-esophageal reflux disease without esophagitis: Secondary | ICD-10-CM | POA: Diagnosis not present

## 2020-09-02 DIAGNOSIS — M5116 Intervertebral disc disorders with radiculopathy, lumbar region: Secondary | ICD-10-CM | POA: Diagnosis not present

## 2020-09-02 DIAGNOSIS — Z0000 Encounter for general adult medical examination without abnormal findings: Secondary | ICD-10-CM | POA: Diagnosis not present

## 2020-09-02 DIAGNOSIS — I2609 Other pulmonary embolism with acute cor pulmonale: Secondary | ICD-10-CM | POA: Diagnosis not present

## 2020-09-02 DIAGNOSIS — D500 Iron deficiency anemia secondary to blood loss (chronic): Secondary | ICD-10-CM | POA: Diagnosis not present

## 2020-10-07 DIAGNOSIS — J208 Acute bronchitis due to other specified organisms: Secondary | ICD-10-CM | POA: Diagnosis not present

## 2020-10-07 DIAGNOSIS — U071 COVID-19: Secondary | ICD-10-CM | POA: Diagnosis not present

## 2020-10-10 ENCOUNTER — Other Ambulatory Visit: Payer: Self-pay

## 2020-10-10 NOTE — Patient Outreach (Signed)
[facility]) Care Management  10/10/2020  Cheryl Goodman 08-25-1941 [id number]   Telephone call to patient for disease management follow up.   No answer.  HIPAA compliant voice message left.    Plan: If no return call, RN CM will attempt patient again in April.  Bary Leriche, RN, MSN [facility] Care Management Care Management Coordinator Direct Line [phone] Cell [phone] Toll Free: [phone]  Fax: [phone]

## 2020-10-14 DIAGNOSIS — U071 COVID-19: Secondary | ICD-10-CM | POA: Diagnosis not present

## 2020-10-14 DIAGNOSIS — R5383 Other fatigue: Secondary | ICD-10-CM | POA: Diagnosis not present

## 2020-10-14 DIAGNOSIS — J40 Bronchitis, not specified as acute or chronic: Secondary | ICD-10-CM | POA: Diagnosis not present

## 2020-11-18 ENCOUNTER — Ambulatory Visit: Payer: Medicare HMO | Admitting: Obstetrics & Gynecology

## 2020-11-18 ENCOUNTER — Other Ambulatory Visit: Payer: Self-pay

## 2020-11-18 ENCOUNTER — Encounter: Payer: Self-pay | Admitting: Obstetrics & Gynecology

## 2020-11-18 VITALS — BP 126/84

## 2020-11-18 DIAGNOSIS — N393 Stress incontinence (female) (male): Secondary | ICD-10-CM | POA: Diagnosis not present

## 2020-11-18 DIAGNOSIS — N644 Mastodynia: Secondary | ICD-10-CM | POA: Diagnosis not present

## 2020-11-18 NOTE — Progress Notes (Signed)
    Cheryl Goodman [birth year] [id number]        80 y.o.  [id number]   RP: Rt Breast pain and tenderness/SUI  HPI: Rt breast pain and tenderness at the mid external quadrant.  No lump felt.  SUI especially when her bladder is full.   OB History  Gravida Para Term Preterm AB Living  5 1     0 5  SAB IAB Ectopic Multiple Live Births      0        # Outcome Date GA Lbr Len/2nd Weight Sex Delivery Anes PTL Lv  5 Gravida           4 Gravida           3 Gravida           2 Gravida           1 Para             Past medical history,surgical history, problem list, medications, allergies, family history and social history were all reviewed and documented in the EPIC chart.   Directed ROS with pertinent positives and negatives documented in the history of present illness/assessment and plan.  Exam:  Vitals:   11/18/20 1212  BP: 126/84   General appearance:  Normal  Breast exam: Lt breast and Lt axilla normal                        Rt breast tender at the mid external area, no nodule or mass felt.  Rt axilla negative.  Abdomen: Normal  Gynecologic exam: Vulva normal.  S/P Total hysterectomy.  No cystocele. Small rectocele.   Assessment/Plan:  80 y.o. G5P0005   1. Breast pain, right Rt breast tenderness intermittently x 4 months.  No Breast nodule/mass felt.  Axillae Negative.  Will proceed with a Rt Dx mammo/Rt Breast US.  2. SUI (stress urinary incontinence, female) Counseling done.  Avoid overfilling of the bladder.  Avoid pelvic floor pressure.  Kegel exercises instructed.  Consider PT as needed.  Other orders - aspirin EC 81 MG tablet; Take 81 mg by mouth daily. Swallow whole.  Genia Del MD, 12:30 PM 11/18/2020

## 2020-11-19 ENCOUNTER — Ambulatory Visit
Admission: RE | Admit: 2020-11-19 | Discharge: 2020-11-19 | Disposition: A | Discharge status: Home / Self Care | Payer: Medicare HMO | Source: Ambulatory Visit | Attending: Internal Medicine | Admitting: Internal Medicine

## 2020-11-19 ENCOUNTER — Other Ambulatory Visit: Payer: Self-pay | Admitting: Internal Medicine

## 2020-11-19 ENCOUNTER — Other Ambulatory Visit (HOSPITAL_COMMUNITY): Payer: Self-pay | Admitting: Internal Medicine

## 2020-11-19 ENCOUNTER — Telehealth: Payer: Self-pay | Admitting: *Deleted

## 2020-11-19 DIAGNOSIS — R600 Localized edema: Secondary | ICD-10-CM | POA: Diagnosis not present

## 2020-11-19 DIAGNOSIS — N393 Stress incontinence (female) (male): Secondary | ICD-10-CM

## 2020-11-19 DIAGNOSIS — Z86711 Personal history of pulmonary embolism: Secondary | ICD-10-CM | POA: Diagnosis not present

## 2020-11-19 DIAGNOSIS — N644 Mastodynia: Secondary | ICD-10-CM

## 2020-11-19 DIAGNOSIS — Z8616 Personal history of COVID-19: Secondary | ICD-10-CM | POA: Diagnosis not present

## 2020-11-19 NOTE — Telephone Encounter (Signed)
-----   Message from Genia Del, MD sent at 11/18/2020 12:44 PM EST ----- Regarding: Refer to PT and Rt Dx mammo/US Rt breast tenderness and pain at the mid external area.  No nodule or mass felt, mildly dense area.  SUI, refer to Physical Therapy for pelvic floor reinforcement.

## 2020-11-19 NOTE — Telephone Encounter (Signed)
Patient scheduled at the breast center on 01/01/21 @ 8:20am. Patient informed asked me to send a my chart message as well.

## 2020-11-25 ENCOUNTER — Encounter: Payer: Self-pay | Admitting: Obstetrics & Gynecology

## 2021-01-01 ENCOUNTER — Ambulatory Visit
Admission: RE | Admit: 2021-01-01 | Discharge: 2021-01-01 | Disposition: A | Discharge status: Home / Self Care | Payer: Medicare HMO | Source: Ambulatory Visit | Attending: Obstetrics & Gynecology | Admitting: Obstetrics & Gynecology

## 2021-01-01 ENCOUNTER — Other Ambulatory Visit: Payer: Self-pay

## 2021-01-01 DIAGNOSIS — N644 Mastodynia: Secondary | ICD-10-CM | POA: Diagnosis not present

## 2021-01-01 DIAGNOSIS — R928 Other abnormal and inconclusive findings on diagnostic imaging of breast: Secondary | ICD-10-CM | POA: Diagnosis not present

## 2021-01-01 DIAGNOSIS — N6489 Other specified disorders of breast: Secondary | ICD-10-CM | POA: Diagnosis not present

## 2021-01-07 ENCOUNTER — Other Ambulatory Visit: Payer: Self-pay

## 2021-01-07 NOTE — Patient Outreach (Signed)
[facility]) Care Management  01/07/2021  SHELLBY SCHLINK 1941/01/16 [id number]   Telephone call to patient for disease management follow up.   No answer.  HIPAA compliant voice message left.    Plan: If no return call, RN CM will attempt patient again in July.   Bary Leriche, RN, MSN [facility] Care Management Care Management Coordinator Direct Line [phone] Cell [phone] Toll Free: [phone]  Fax: [phone]

## 2021-01-09 ENCOUNTER — Ambulatory Visit: Payer: Self-pay

## 2021-01-26 ENCOUNTER — Other Ambulatory Visit: Payer: Self-pay | Admitting: Internal Medicine

## 2021-01-26 ENCOUNTER — Other Ambulatory Visit (HOSPITAL_COMMUNITY): Payer: Self-pay | Admitting: Internal Medicine

## 2021-01-26 DIAGNOSIS — Z86711 Personal history of pulmonary embolism: Secondary | ICD-10-CM | POA: Diagnosis not present

## 2021-01-26 DIAGNOSIS — R600 Localized edema: Secondary | ICD-10-CM

## 2021-01-26 DIAGNOSIS — R0600 Dyspnea, unspecified: Secondary | ICD-10-CM | POA: Diagnosis not present

## 2021-01-26 DIAGNOSIS — R0609 Other forms of dyspnea: Secondary | ICD-10-CM | POA: Diagnosis not present

## 2021-01-26 DIAGNOSIS — I2699 Other pulmonary embolism without acute cor pulmonale: Secondary | ICD-10-CM | POA: Diagnosis not present

## 2021-01-26 DIAGNOSIS — R5383 Other fatigue: Secondary | ICD-10-CM | POA: Diagnosis not present

## 2021-01-26 DIAGNOSIS — R5382 Chronic fatigue, unspecified: Secondary | ICD-10-CM | POA: Diagnosis not present

## 2021-01-27 DIAGNOSIS — R0600 Dyspnea, unspecified: Secondary | ICD-10-CM | POA: Diagnosis not present

## 2021-02-04 ENCOUNTER — Ambulatory Visit
Admission: RE | Admit: 2021-02-04 | Discharge: 2021-02-04 | Disposition: A | Discharge status: Home / Self Care | Payer: Medicare HMO | Source: Ambulatory Visit | Attending: Internal Medicine | Admitting: Internal Medicine

## 2021-02-04 ENCOUNTER — Other Ambulatory Visit: Payer: Self-pay

## 2021-02-04 DIAGNOSIS — R600 Localized edema: Secondary | ICD-10-CM | POA: Insufficient documentation

## 2021-02-04 DIAGNOSIS — I2699 Other pulmonary embolism without acute cor pulmonale: Secondary | ICD-10-CM | POA: Insufficient documentation

## 2021-02-04 DIAGNOSIS — J9811 Atelectasis: Secondary | ICD-10-CM | POA: Diagnosis not present

## 2021-02-04 DIAGNOSIS — R531 Weakness: Secondary | ICD-10-CM | POA: Diagnosis not present

## 2021-02-04 DIAGNOSIS — K449 Diaphragmatic hernia without obstruction or gangrene: Secondary | ICD-10-CM | POA: Diagnosis not present

## 2021-02-04 MED ORDER — IOHEXOL 350 MG/ML SOLN
75.0000 mL | Freq: Once | INTRAVENOUS | Status: AC | PRN
  Administered 2021-02-04: 75 mL via INTRAVENOUS

## 2021-02-05 ENCOUNTER — Other Ambulatory Visit: Payer: Self-pay | Admitting: Internal Medicine

## 2021-02-05 DIAGNOSIS — K3189 Other diseases of stomach and duodenum: Secondary | ICD-10-CM

## 2021-02-05 DIAGNOSIS — R1084 Generalized abdominal pain: Secondary | ICD-10-CM | POA: Diagnosis not present

## 2021-02-05 DIAGNOSIS — R5382 Chronic fatigue, unspecified: Secondary | ICD-10-CM | POA: Diagnosis not present

## 2021-02-05 DIAGNOSIS — R0600 Dyspnea, unspecified: Secondary | ICD-10-CM | POA: Diagnosis not present

## 2021-02-05 DIAGNOSIS — R0609 Other forms of dyspnea: Secondary | ICD-10-CM

## 2021-02-11 ENCOUNTER — Ambulatory Visit
Admission: RE | Admit: 2021-02-11 | Discharge: 2021-02-11 | Disposition: A | Discharge status: Home / Self Care | Payer: Medicare HMO | Source: Ambulatory Visit | Attending: Gastroenterology | Admitting: Gastroenterology

## 2021-02-11 ENCOUNTER — Other Ambulatory Visit: Payer: Self-pay | Admitting: Gastroenterology

## 2021-02-11 ENCOUNTER — Other Ambulatory Visit: Payer: Self-pay

## 2021-02-11 DIAGNOSIS — R110 Nausea: Secondary | ICD-10-CM | POA: Diagnosis not present

## 2021-02-11 DIAGNOSIS — R109 Unspecified abdominal pain: Secondary | ICD-10-CM | POA: Diagnosis not present

## 2021-02-11 DIAGNOSIS — R0609 Other forms of dyspnea: Secondary | ICD-10-CM

## 2021-02-11 DIAGNOSIS — R1031 Right lower quadrant pain: Secondary | ICD-10-CM | POA: Diagnosis not present

## 2021-02-11 DIAGNOSIS — R933 Abnormal findings on diagnostic imaging of other parts of digestive tract: Secondary | ICD-10-CM | POA: Diagnosis not present

## 2021-02-11 DIAGNOSIS — K3189 Other diseases of stomach and duodenum: Secondary | ICD-10-CM | POA: Diagnosis not present

## 2021-02-11 DIAGNOSIS — R1032 Left lower quadrant pain: Secondary | ICD-10-CM | POA: Diagnosis not present

## 2021-02-11 DIAGNOSIS — R0600 Dyspnea, unspecified: Secondary | ICD-10-CM | POA: Insufficient documentation

## 2021-02-11 DIAGNOSIS — R1084 Generalized abdominal pain: Secondary | ICD-10-CM

## 2021-02-11 MED ORDER — IOHEXOL 300 MG/ML  SOLN
100.0000 mL | Freq: Once | INTRAMUSCULAR | Status: AC | PRN
  Administered 2021-02-11: 100 mL via INTRAVENOUS

## 2021-02-23 ENCOUNTER — Ambulatory Visit: Payer: Medicare HMO

## 2021-03-03 DIAGNOSIS — Z0000 Encounter for general adult medical examination without abnormal findings: Secondary | ICD-10-CM | POA: Diagnosis not present

## 2021-03-03 DIAGNOSIS — R0600 Dyspnea, unspecified: Secondary | ICD-10-CM | POA: Diagnosis not present

## 2021-03-03 DIAGNOSIS — M1712 Unilateral primary osteoarthritis, left knee: Secondary | ICD-10-CM | POA: Diagnosis not present

## 2021-03-03 DIAGNOSIS — K219 Gastro-esophageal reflux disease without esophagitis: Secondary | ICD-10-CM | POA: Diagnosis not present

## 2021-04-08 ENCOUNTER — Other Ambulatory Visit: Payer: Self-pay

## 2021-04-08 NOTE — Patient Outreach (Signed)
[facility]) Care Management  04/08/2021  Cheryl Goodman March 09, 1941 [id number]   Telephone call to patient for disease management follow up.   No answer.  HIPAA compliant voice message left.    Plan: If no return call, RN CM will attempt patient again in October.    Bary Leriche, RN, MSN [facility] Care Management Care Management Coordinator Direct Line [phone] Cell [phone] Toll Free: [phone]  Fax: [phone]

## 2021-04-28 ENCOUNTER — Encounter: Payer: Self-pay | Admitting: General Surgery

## 2021-04-29 ENCOUNTER — Encounter
Admission: RE | Disposition: A | Discharge status: Home / Self Care | Payer: Self-pay | Source: Home / Self Care | Attending: General Surgery

## 2021-04-29 ENCOUNTER — Encounter: Payer: Self-pay | Admitting: General Surgery

## 2021-04-29 ENCOUNTER — Ambulatory Visit
Admission: RE | Admit: 2021-04-29 | Discharge: 2021-04-29 | Disposition: A | Discharge status: Home / Self Care | Payer: Medicare HMO | Attending: General Surgery | Admitting: General Surgery

## 2021-04-29 ENCOUNTER — Ambulatory Visit: Payer: Medicare HMO | Admitting: Registered Nurse

## 2021-04-29 DIAGNOSIS — R110 Nausea: Secondary | ICD-10-CM | POA: Diagnosis not present

## 2021-04-29 DIAGNOSIS — K5730 Diverticulosis of large intestine without perforation or abscess without bleeding: Secondary | ICD-10-CM | POA: Diagnosis not present

## 2021-04-29 DIAGNOSIS — R1031 Right lower quadrant pain: Secondary | ICD-10-CM | POA: Diagnosis not present

## 2021-04-29 DIAGNOSIS — Z885 Allergy status to narcotic agent status: Secondary | ICD-10-CM | POA: Insufficient documentation

## 2021-04-29 DIAGNOSIS — Z86711 Personal history of pulmonary embolism: Secondary | ICD-10-CM | POA: Insufficient documentation

## 2021-04-29 DIAGNOSIS — Z96651 Presence of right artificial knee joint: Secondary | ICD-10-CM | POA: Diagnosis not present

## 2021-04-29 DIAGNOSIS — Z79899 Other long term (current) drug therapy: Secondary | ICD-10-CM | POA: Diagnosis not present

## 2021-04-29 DIAGNOSIS — K449 Diaphragmatic hernia without obstruction or gangrene: Secondary | ICD-10-CM | POA: Insufficient documentation

## 2021-04-29 DIAGNOSIS — Z882 Allergy status to sulfonamides status: Secondary | ICD-10-CM | POA: Diagnosis not present

## 2021-04-29 DIAGNOSIS — Z888 Allergy status to other drugs, medicaments and biological substances status: Secondary | ICD-10-CM | POA: Diagnosis not present

## 2021-04-29 DIAGNOSIS — Z880 Allergy status to penicillin: Secondary | ICD-10-CM | POA: Diagnosis not present

## 2021-04-29 DIAGNOSIS — R1032 Left lower quadrant pain: Secondary | ICD-10-CM | POA: Insufficient documentation

## 2021-04-29 DIAGNOSIS — Z7982 Long term (current) use of aspirin: Secondary | ICD-10-CM | POA: Diagnosis not present

## 2021-04-29 HISTORY — PX: ESOPHAGOGASTRODUODENOSCOPY (EGD) WITH PROPOFOL: SHX5813

## 2021-04-29 HISTORY — DX: Sleep apnea, unspecified: G47.30

## 2021-04-29 HISTORY — PX: COLONOSCOPY WITH PROPOFOL: SHX5780

## 2021-04-29 SURGERY — ESOPHAGOGASTRODUODENOSCOPY (EGD) WITH PROPOFOL
Anesthesia: General

## 2021-04-29 MED ORDER — PROPOFOL 10 MG/ML IV BOLUS
INTRAVENOUS | Status: DC | PRN
  Administered 2021-04-29: 80 mg via INTRAVENOUS

## 2021-04-29 MED ORDER — LIDOCAINE HCL (CARDIAC) PF 100 MG/5ML IV SOSY
PREFILLED_SYRINGE | INTRAVENOUS | Status: DC | PRN
  Administered 2021-04-29: 40 mg via INTRAVENOUS

## 2021-04-29 MED ORDER — GLYCOPYRROLATE 0.2 MG/ML IJ SOLN
INTRAMUSCULAR | Status: DC | PRN
  Administered 2021-04-29: .2 mg via INTRAVENOUS

## 2021-04-29 MED ORDER — PROPOFOL 500 MG/50ML IV EMUL
INTRAVENOUS | Status: AC
  Filled 2021-04-29: qty 50

## 2021-04-29 MED ORDER — SODIUM CHLORIDE 0.9 % IV SOLN: INTRAVENOUS | Status: DC

## 2021-04-29 MED ORDER — PROPOFOL 500 MG/50ML IV EMUL
INTRAVENOUS | Status: DC | PRN
  Administered 2021-04-29: 150 ug/kg/min via INTRAVENOUS

## 2021-04-29 MED ORDER — GLYCOPYRROLATE 0.2 MG/ML IJ SOLN
INTRAMUSCULAR | Status: AC
  Filled 2021-04-29: qty 1

## 2021-04-29 NOTE — Op Note (Signed)
Preoperative diagnosis: Bilateral lower abdominal pain.  Postoperative diagnosis: Diverticulosis.  Operative procedure: Colonoscopy.  Operating Surgeon: Donnalee Curry, MD.  Anesthesia: Propofol by anesthesia.  Estimated blood loss: 0.  Clinical note: This patient had a several month history of lower abdominal pain.  CT scan showed diverticulosis.  She is felt to be a candidate for diagnostic procedure.  She tolerated her prep well.  The probation system was down after her EGD and it was necessary to complete her colonoscopy without video imaging.  Scope entrance time: 1048.  Time to cecum 1054.  Scope out at 11 AM.  Findings: Extensive diverticulosis in the sigmoid colon.  Scattered diverticuli in the transverse colon, especially proximal section.  No mucosal lesions.  Retroflexion of the scope was unremarkable.  Impression diverticulosis.  The patient was taken to the endoscopy holding unit in good condition.

## 2021-04-29 NOTE — Op Note (Signed)
[facility] Gastroenterology Patient Name: Cheryl Goodman Procedure Date: 04/29/2021 10:27 AM MRN: [id number] Account #: [id number] Date of Birth: 11/18/1940 Admit Type: Outpatient Age: 80 Room: [facility] ENDO ROOM 1 Gender: Female Note Status: Finalized Procedure:             Upper GI endoscopy Indications:           Nausea Providers:             Earline Mayotte, MD Referring MD:          Danella Penton, MD (Referring MD) Medicines:             Propofol per Anesthesia Complications:         No immediate complications. Procedure:             Pre-Anesthesia Assessment:                        - Prior to the procedure, a History and Physical was                         performed, and patient medications, allergies and                         sensitivities were reviewed. The patient's tolerance                         of previous anesthesia was reviewed.                        - The risks and benefits of the procedure and the                         sedation options and risks were discussed with the                         patient. All questions were answered and informed                         consent was obtained.                        After obtaining informed consent, the endoscope was                         passed under direct vision. Throughout the procedure,                         the patient's blood pressure, pulse, and oxygen                         saturations were monitored continuously. The Endoscope                         was introduced through the mouth, and advanced to the                         second part of duodenum. The upper GI endoscopy was  accomplished without difficulty. The patient tolerated                         the procedure well. Findings:      A large hiatal hernia was present.      The stomach was normal.      The examined duodenum was normal. Impression:            - Large hiatal hernia.                         - Normal stomach.                        - Normal examined duodenum.                        - No specimens collected. Recommendation:        - Perform a colonoscopy today. Procedure Code(s):     --- Professional ---                        [phone], Esophagogastroduodenoscopy, flexible,                         transoral; diagnostic, including collection of                         specimen(s) by brushing or washing, when performed                         (separate procedure) Diagnosis Code(s):     --- Professional ---                        K44.9, Diaphragmatic hernia without obstruction or                         gangrene                        R11.0, Nausea CPT copyright 2019 [employer] Medical Association. All rights reserved. The codes documented in this report are preliminary and upon coder review may  be revised to meet current compliance requirements. Earline Mayotte, MD 04/29/2021 10:38:09 AM This report has been signed electronically. Number of Addenda: 0 Note Initiated On: 04/29/2021 10:27 AM Estimated Blood Loss:  Estimated blood loss: none.      [facility]

## 2021-04-29 NOTE — Anesthesia Preprocedure Evaluation (Signed)
Anesthesia Evaluation  Patient identified by MRN, date of birth, ID band Patient awake    Reviewed: Allergy & Precautions, NPO status , Patient's Chart, lab work & pertinent test results  Airway Mallampati: III  TM Distance: >3 FB Neck ROM: full    Dental no notable dental hx.    Pulmonary sleep apnea ,    Pulmonary exam normal        Cardiovascular hypertension, On Medications negative cardio ROS Normal cardiovascular exam     Neuro/Psych negative neurological ROS  negative psych ROS   GI/Hepatic Neg liver ROS, hiatal hernia, PUD, GERD  ,  Endo/Other  negative endocrine ROS  Renal/GU negative Renal ROS  negative genitourinary   Musculoskeletal   Abdominal   Peds  Hematology negative hematology ROS (+)   Anesthesia Other Findings Past Medical History: 08/10/2019: Acute pulmonary embolism with acute cor pulmonale (HCC) No date: Arthritis No date: Dysphagia No date: GERD (gastroesophageal reflux disease) No date: History of hiatal hernia No date: History of kidney stones     Comment:  x2 ; passed independently No date: Hyperlipidemia No date: Hypertension No date: Nausea No date: Sleep apnea  Past Surgical History: No date: ABDOMINAL HYSTERECTOMY No date: APPENDECTOMY No date: BREAST BIOPSY; Right No date: BREAST SURGERY No date: CARPAL TUNNEL RELEASE No date: CHOLECYSTECTOMY 09/01/2015: COLONOSCOPY WITH PROPOFOL; N/A     Comment:  Procedure: COLONOSCOPY WITH PROPOFOL;  Surgeon: Scot Jun, MD;  Location: [facility] ENDOSCOPY;  Service:               Endoscopy;  Laterality: N/A; 07/17/2019: ESOPHAGOGASTRODUODENOSCOPY; N/A     Comment:  Procedure: ESOPHAGOGASTRODUODENOSCOPY (EGD);  Surgeon:               Pasty Spillers, MD;  Location: [facility] ENDOSCOPY;                Service: Endoscopy;  Laterality: N/A; No date: REDUCTION MAMMAPLASTY; Bilateral 09/01/2015: SAVORY DILATION; N/A      Comment:  Procedure: SAVORY DILATION;  Surgeon: Scot Jun,               MD;  Location: [facility] ENDOSCOPY;  Service: Endoscopy;                Laterality: N/A; 10/31/2017: TOTAL KNEE ARTHROPLASTY; Right     Comment:  Procedure: RIGHT TOTAL KNEE ARTHROPLASTY;  Surgeon:               Ollen Gross, MD;  Location: [facility] ORS;  Service:               Orthopedics;  Laterality: Right;  BMI    Body Mass Index: 36.36 kg/m      Reproductive/Obstetrics negative OB ROS                             Anesthesia Physical Anesthesia Plan  ASA: 2  Anesthesia Plan: General   Post-op Pain Management:    Induction: Intravenous  PONV Risk Score and Plan: Propofol infusion and TIVA  Airway Management Planned: Natural Airway and Nasal Cannula  Additional Equipment:   Intra-op Plan:   Post-operative Plan:   Informed Consent: I have reviewed the patients History and Physical, chart, labs and discussed the procedure including the risks, benefits and alternatives for the proposed anesthesia with the patient or authorized representative who has indicated his/her understanding  and acceptance.     Dental Advisory Given  Plan Discussed with: Anesthesiologist, CRNA and Surgeon  Anesthesia Plan Comments: (Patient consented for risks of anesthesia including but not limited to:  - adverse reactions to medications - risk of airway placement if required - damage to eyes, teeth, lips or other oral mucosa - nerve damage due to positioning  - sore throat or hoarseness - Damage to heart, brain, nerves, lungs, other parts of body or loss of life  Patient voiced understanding.)        Anesthesia Quick Evaluation

## 2021-04-29 NOTE — Anesthesia Procedure Notes (Signed)
Date/Time: 04/29/2021 10:27 AM Performed by: Stormy Fabian, CRNA Pre-anesthesia Checklist: Patient identified, Emergency Drugs available, Suction available and Patient being monitored Patient Re-evaluated:Patient Re-evaluated prior to induction Oxygen Delivery Method: Nasal cannula Induction Type: IV induction Dental Injury: Teeth and Oropharynx as per pre-operative assessment  Comments: Nasal cannula with etCO2 monitoring

## 2021-04-29 NOTE — H&P (Signed)
Cheryl Goodman [id number] 1941-07-07     HPI:  80 y/o woman with a history of abdominal pain and prior abnormal imaging in May 2022.  Abdominal pain and nausea better.  For EGD/ Colon to assess CT angio results.  Medications Prior to Admission  Medication Sig Dispense Refill Last Dose   hyoscyamine (ANASPAZ) 0.125 MG TBDP disintergrating tablet Place 0.125 mg under the tongue every 6 (six) hours as needed for cramping.      metoCLOPramide (REGLAN) 5 MG tablet Take 5 mg by mouth at bedtime.      torsemide (DEMADEX) 20 MG tablet Take 20 mg by mouth daily.      triamterene-hydrochlorothiazide (DYAZIDE) 37.5-25 MG capsule Take 1 capsule by mouth daily.      aspirin EC 81 MG tablet Take 81 mg by mouth daily. Swallow whole.   04/23/2021   Biotin 1000 MCG tablet Take 1,000 mcg by mouth daily.      Cholecalciferol (VITAMIN D3) 125 MCG (5000 UT) TABS Take 5,000 Units by mouth daily.      Iron-Vitamin C (VITRON-C) 65-125 MG TABS Take 1 tablet by mouth daily.      omeprazole (PRILOSEC) 40 MG capsule Take 40 mg by mouth 2 (two) times daily.      omeprazole (PRILOSEC) 40 MG capsule Take 1 capsule (40 mg total) by mouth 2 (two) times daily for 14 days. 28 capsule 0    polyethylene glycol (MIRALAX / GLYCOLAX) 17 g packet Take 17 g by mouth daily. 30 each 0    sucralfate (CARAFATE) 1 g tablet Take 1 g by mouth 2 (two) times daily.      traMADol (ULTRAM) 50 MG tablet Take 50 mg by mouth every 6 (six) hours as needed for moderate pain.      Allergies  Allergen Reactions   Penicillins Rash and Other (See Comments)    Has patient had a PCN reaction causing immediate rash, facial/tongue/throat swelling, SOB or lightheadedness with hypotension: No Has patient had a PCN reaction causing severe rash involving mucus membranes or skin necrosis: No Has patient had a PCN reaction that required hospitalization: No Has patient had a PCN reaction occurring within the last 10 years: No If all of the above answers  are "NO", then may proceed with Cephalosporin use.    Ace Inhibitors Cough   Dexilant [Dexlansoprazole] Other (See Comments)    syncope   Oxycodone Nausea Only   Sulfa Antibiotics Swelling   Past Medical History:  Diagnosis Date   Acute pulmonary embolism with acute cor pulmonale (HCC) 08/10/2019   Arthritis    Dysphagia    GERD (gastroesophageal reflux disease)    History of hiatal hernia    History of kidney stones    x2 ; passed independently   Hyperlipidemia    Hypertension    Nausea    Sleep apnea    Past Surgical History:  Procedure Laterality Date   ABDOMINAL HYSTERECTOMY     APPENDECTOMY     BREAST BIOPSY Right    BREAST SURGERY     CARPAL TUNNEL RELEASE     CHOLECYSTECTOMY     COLONOSCOPY WITH PROPOFOL N/A 09/01/2015   Procedure: COLONOSCOPY WITH PROPOFOL;  Surgeon: Scot Jun, MD;  Location: [facility] ENDOSCOPY;  Service: Endoscopy;  Laterality: N/A;   ESOPHAGOGASTRODUODENOSCOPY N/A 07/17/2019   Procedure: ESOPHAGOGASTRODUODENOSCOPY (EGD);  Surgeon: Pasty Spillers, MD;  Location: [facility] ENDOSCOPY;  Service: Endoscopy;  Laterality: N/A;   REDUCTION MAMMAPLASTY Bilateral    SAVORY  DILATION N/A 09/01/2015   Procedure: SAVORY DILATION;  Surgeon: Scot Jun, MD;  Location: [facility] ENDOSCOPY;  Service: Endoscopy;  Laterality: N/A;   TOTAL KNEE ARTHROPLASTY Right 10/31/2017   Procedure: RIGHT TOTAL KNEE ARTHROPLASTY;  Surgeon: Ollen Gross, MD;  Location: [facility] ORS;  Service: Orthopedics;  Laterality: Right;   Social History   Socioeconomic History   Marital status: Married    Spouse name: Not on file   Number of children: Not on file   Years of education: Not on file   Highest education level: Not on file  Occupational History   Not on file  Tobacco Use   Smoking status: Never   Smokeless tobacco: Never  Vaping Use   Vaping Use: Never used  Substance and Sexual Activity   Alcohol use: No   Drug use: No   Sexual activity: Yes    Birth  control/protection: None    Comment: intercourse age 60, less than 5 sexual partners  Other Topics Concern   Not on file  Social History Narrative   Not on file   Social Determinants of Health   Financial Resource Strain: Not on file  Food Insecurity: Not on file  Transportation Needs: Not on file  Physical Activity: Not on file  Stress: Not on file  Social Connections: Not on file  Intimate Partner Violence: Not on file   Social History   Social History Narrative   Not on file     ROS: Negative.     PE: HEENT: Negative. Lungs: Clear. Cardio: RR.   Assessment/Plan:  Proceed with planned endoscopy.    Cheryl Goodman 04/29/2021   Assessment/Plan:  Proceed with planned endoscopy.

## 2021-04-29 NOTE — Anesthesia Postprocedure Evaluation (Signed)
Anesthesia Post Note  Patient: DINORA HEMM  Procedure(s) Performed: ESOPHAGOGASTRODUODENOSCOPY (EGD) WITH PROPOFOL COLONOSCOPY WITH PROPOFOL  Patient location during evaluation: Endoscopy Anesthesia Type: General Level of consciousness: awake and alert Pain management: pain level controlled Vital Signs Assessment: post-procedure vital signs reviewed and stable Respiratory status: spontaneous breathing, nonlabored ventilation, respiratory function stable and patient connected to nasal cannula oxygen Cardiovascular status: blood pressure returned to baseline and stable Postop Assessment: no apparent nausea or vomiting Anesthetic complications: no   No notable events documented.   Last Vitals:  Vitals:   04/29/21 1105 04/29/21 1115  BP: (!) 175/93 (!) 145/82  Pulse:    Resp: (!) 21   Temp: (!) 36.1 C   SpO2: 98%     Last Pain:  Vitals:   04/29/21 1115  TempSrc:   PainSc: 0-No pain                 Johny Blamer

## 2021-04-29 NOTE — Op Note (Signed)
[facility] Gastroenterology Patient Name: Cheryl Goodman Procedure Date: 04/29/2021 10:26 AM MRN: [id number] Account #: [id number] Date of Birth: 11-20-1940 Admit Type: Outpatient Age: 80 Room: [facility] ENDO ROOM 1 Gender: Female Note Status: Finalized Procedure:             Colonoscopy Indications:           Abdominal pain in the left lower quadrant, Abdominal                         pain in the right lower quadrant Providers:             Earline Mayotte, MD Referring MD:          Danella Penton, MD (Referring MD) Medicines:             Propofol per Anesthesia Complications:         No immediate complications. Procedure:             Pre-Anesthesia Assessment:                        - Prior to the procedure, a History and Physical was                         performed, and patient medications, allergies and                         sensitivities were reviewed. The patient's tolerance                         of previous anesthesia was reviewed.                        - The risks and benefits of the procedure and the                         sedation options and risks were discussed with the                         patient. All questions were answered and informed                         consent was obtained.                        After obtaining informed consent, the colonoscope was                         passed under direct vision. Throughout the procedure,                         the patient's blood pressure, pulse, and oxygen                         saturations were monitored continuously. The                         Colonoscope was introduced through the anus and                         advanced  to the the cecum, identified by appendiceal                         orifice and ileocecal valve. The colonoscopy was                         performed without difficulty. The patient tolerated                         the procedure well. The quality of the bowel                          preparation was excellent. Findings:      Multiple medium-mouthed diverticula were found in the sigmoid colon and       transverse colon.      The retroflexed view of the distal rectum and anal verge was normal and       showed no anal or rectal abnormalities. Impression:            - Diverticulosis in the sigmoid colon and in the                         transverse colon.                        - The distal rectum and anal verge are normal on                         retroflexion view.                        - No specimens collected. Recommendation:        - Discharge patient to home (via wheelchair).                        - Resume regular diet indefinitely. Procedure Code(s):     --- Professional ---                        [phone], Colonoscopy, flexible; diagnostic, including                         collection of specimen(s) by brushing or washing, when                         performed (separate procedure) Diagnosis Code(s):     --- Professional ---                        R10.32, Left lower quadrant pain                        R10.31, Right lower quadrant pain                        K57.30, Diverticulosis of large intestine without                         perforation or abscess without bleeding CPT copyright 2019 American Medical Association. All rights reserved. The codes documented in this report are preliminary and upon coder review may  be revised to meet current compliance requirements. Earline Mayotte, MD 04/29/2021 11:24:41 AM This report has been signed electronically. Number of Addenda: 0 Note Initiated On: 04/29/2021 10:26 AM Estimated Blood Loss:  Estimated blood loss: none.      [facility]

## 2021-04-29 NOTE — Transfer of Care (Signed)
Immediate Anesthesia Transfer of Care Note  Patient: Cheryl Goodman  Procedure(s) Performed: Procedure(s): ESOPHAGOGASTRODUODENOSCOPY (EGD) WITH PROPOFOL (N/A) COLONOSCOPY WITH PROPOFOL (N/A)  Patient Location: PACU and Endoscopy Unit  Anesthesia Type:General  Level of Consciousness: sedated  Airway & Oxygen Therapy: Patient Spontanous Breathing and Patient connected to nasal cannula oxygen  Post-op Assessment: Report given to RN and Post -op Vital signs reviewed and stable  Post vital signs: Reviewed and stable  Last Vitals:  Vitals:   04/29/21 0924 04/29/21 1105  BP: (!) 175/86 (!) 175/93  Pulse: 89   Resp: 17 (!) 21  Temp: (!) 36 C (!) 36.1 C  SpO2: 100% 98%    Complications: No apparent anesthesia complications

## 2021-04-30 ENCOUNTER — Encounter: Payer: Self-pay | Admitting: General Surgery

## 2021-06-29 DIAGNOSIS — M1712 Unilateral primary osteoarthritis, left knee: Secondary | ICD-10-CM | POA: Diagnosis not present

## 2021-07-08 ENCOUNTER — Other Ambulatory Visit: Payer: Self-pay

## 2021-07-08 NOTE — Patient Outreach (Signed)
[facility]) Care Management  07/08/2021  [name] Jun 06, 1941 [id number]   Telephone call to patient for disease management follow up.   No answer.  HIPAA compliant voice message left.    Plan: If no return call, RN CM will attempt patient again in January.  Bary Leriche, RN, MSN [facility] Care Management Care Management Coordinator Direct Line [phone] Cell [phone] Toll Free: [phone]  Fax: [phone]

## 2021-08-31 DIAGNOSIS — E538 Deficiency of other specified B group vitamins: Secondary | ICD-10-CM | POA: Diagnosis not present

## 2021-08-31 DIAGNOSIS — M818 Other osteoporosis without current pathological fracture: Secondary | ICD-10-CM | POA: Diagnosis not present

## 2021-08-31 DIAGNOSIS — E782 Mixed hyperlipidemia: Secondary | ICD-10-CM | POA: Diagnosis not present

## 2021-09-07 DIAGNOSIS — K2970 Gastritis, unspecified, without bleeding: Secondary | ICD-10-CM | POA: Diagnosis not present

## 2021-09-07 DIAGNOSIS — Z0000 Encounter for general adult medical examination without abnormal findings: Secondary | ICD-10-CM | POA: Diagnosis not present

## 2021-09-07 DIAGNOSIS — R600 Localized edema: Secondary | ICD-10-CM | POA: Diagnosis not present

## 2021-09-08 ENCOUNTER — Other Ambulatory Visit: Payer: Self-pay | Admitting: Internal Medicine

## 2021-09-08 ENCOUNTER — Other Ambulatory Visit (HOSPITAL_COMMUNITY): Payer: Self-pay | Admitting: Internal Medicine

## 2021-09-08 DIAGNOSIS — R600 Localized edema: Secondary | ICD-10-CM

## 2021-10-06 ENCOUNTER — Other Ambulatory Visit: Payer: Self-pay

## 2021-10-06 NOTE — Patient Outreach (Signed)
Patient's 10/07/21 appt was cancelled and will be rescheduled in February.

## 2021-10-07 ENCOUNTER — Ambulatory Visit: Payer: Self-pay

## 2021-11-26 ENCOUNTER — Other Ambulatory Visit: Payer: Self-pay

## 2021-11-26 NOTE — Patient Outreach (Signed)
[facility]) Care Management  11/26/2021  LOVETTE MERTA 1940/11/05 [id number]   Telephone call to patient for disease management follow up.   No answer.  HIPAA compliant voice message left.    Plan: If no return call, RN CM will attempt patient again in the month of May.  Bary Leriche, RN, MSN [facility] Care Management Care Management Coordinator Direct Line [phone] Toll Free: [phone]  Fax: [phone]

## 2021-12-08 DIAGNOSIS — M545 Low back pain, unspecified: Secondary | ICD-10-CM | POA: Diagnosis not present

## 2021-12-08 DIAGNOSIS — M810 Age-related osteoporosis without current pathological fracture: Secondary | ICD-10-CM | POA: Diagnosis not present

## 2021-12-08 DIAGNOSIS — R8290 Unspecified abnormal findings in urine: Secondary | ICD-10-CM | POA: Diagnosis not present

## 2021-12-11 DIAGNOSIS — N3000 Acute cystitis without hematuria: Secondary | ICD-10-CM | POA: Diagnosis not present

## 2021-12-11 DIAGNOSIS — M545 Low back pain, unspecified: Secondary | ICD-10-CM | POA: Diagnosis not present

## 2022-01-08 DIAGNOSIS — Z03818 Encounter for observation for suspected exposure to other biological agents ruled out: Secondary | ICD-10-CM | POA: Diagnosis not present

## 2022-01-08 DIAGNOSIS — J019 Acute sinusitis, unspecified: Secondary | ICD-10-CM | POA: Diagnosis not present

## 2022-01-08 DIAGNOSIS — J209 Acute bronchitis, unspecified: Secondary | ICD-10-CM | POA: Diagnosis not present

## 2022-01-08 DIAGNOSIS — B9689 Other specified bacterial agents as the cause of diseases classified elsewhere: Secondary | ICD-10-CM | POA: Diagnosis not present

## 2022-01-19 DIAGNOSIS — S22000S Wedge compression fracture of unspecified thoracic vertebra, sequela: Secondary | ICD-10-CM | POA: Diagnosis not present

## 2022-01-19 DIAGNOSIS — M5441 Lumbago with sciatica, right side: Secondary | ICD-10-CM | POA: Diagnosis not present

## 2022-01-19 DIAGNOSIS — G8929 Other chronic pain: Secondary | ICD-10-CM | POA: Diagnosis not present

## 2022-01-19 DIAGNOSIS — Z6837 Body mass index (BMI) 37.0-37.9, adult: Secondary | ICD-10-CM | POA: Diagnosis not present

## 2022-01-21 DIAGNOSIS — M5441 Lumbago with sciatica, right side: Secondary | ICD-10-CM | POA: Diagnosis not present

## 2022-01-21 DIAGNOSIS — M4316 Spondylolisthesis, lumbar region: Secondary | ICD-10-CM | POA: Diagnosis not present

## 2022-01-21 DIAGNOSIS — R29898 Other symptoms and signs involving the musculoskeletal system: Secondary | ICD-10-CM | POA: Diagnosis not present

## 2022-01-21 DIAGNOSIS — M545 Low back pain, unspecified: Secondary | ICD-10-CM | POA: Diagnosis not present

## 2022-01-28 DIAGNOSIS — M47816 Spondylosis without myelopathy or radiculopathy, lumbar region: Secondary | ICD-10-CM | POA: Diagnosis not present

## 2022-01-28 DIAGNOSIS — Z6838 Body mass index (BMI) 38.0-38.9, adult: Secondary | ICD-10-CM | POA: Diagnosis not present

## 2022-01-28 DIAGNOSIS — M5441 Lumbago with sciatica, right side: Secondary | ICD-10-CM | POA: Diagnosis not present

## 2022-01-28 DIAGNOSIS — M4316 Spondylolisthesis, lumbar region: Secondary | ICD-10-CM | POA: Diagnosis not present

## 2022-02-03 DIAGNOSIS — M47816 Spondylosis without myelopathy or radiculopathy, lumbar region: Secondary | ICD-10-CM | POA: Diagnosis not present

## 2022-02-05 DIAGNOSIS — M5416 Radiculopathy, lumbar region: Secondary | ICD-10-CM | POA: Diagnosis not present

## 2022-02-10 ENCOUNTER — Other Ambulatory Visit: Payer: Self-pay

## 2022-02-10 NOTE — Patient Outreach (Signed)
[facility]) Care Management ? ?02/10/2022 ? ?[name] ?[birth year] ?[id number] ? ? ?Telephone call to patient for disease management follow up.   No answer.  HIPAA compliant voice message left.   ? ?Plan: If no return call, RN CM will attempt patient again in August. ? ?Jone Baseman, RN, MSN ?[facility] Care Management ?Care Management Coordinator ?Direct Line [phone] ?Toll Free: [phone]  ?Fax: [phone] ? ?

## 2022-02-11 ENCOUNTER — Ambulatory Visit: Payer: Self-pay

## 2022-02-25 ENCOUNTER — Other Ambulatory Visit (HOSPITAL_COMMUNITY): Payer: Self-pay | Admitting: Student

## 2022-02-25 ENCOUNTER — Ambulatory Visit (HOSPITAL_COMMUNITY)
Admission: RE | Admit: 2022-02-25 | Discharge: 2022-02-25 | Disposition: A | Discharge status: Home / Self Care | Payer: Medicare HMO | Source: Ambulatory Visit | Attending: Student | Admitting: Student

## 2022-02-25 DIAGNOSIS — M7989 Other specified soft tissue disorders: Secondary | ICD-10-CM | POA: Diagnosis not present

## 2022-02-25 DIAGNOSIS — M4316 Spondylolisthesis, lumbar region: Secondary | ICD-10-CM | POA: Diagnosis not present

## 2022-02-25 DIAGNOSIS — M25562 Pain in left knee: Secondary | ICD-10-CM | POA: Diagnosis not present

## 2022-02-25 DIAGNOSIS — M47816 Spondylosis without myelopathy or radiculopathy, lumbar region: Secondary | ICD-10-CM | POA: Diagnosis not present

## 2022-02-25 DIAGNOSIS — Z6838 Body mass index (BMI) 38.0-38.9, adult: Secondary | ICD-10-CM | POA: Diagnosis not present

## 2022-02-25 DIAGNOSIS — G8929 Other chronic pain: Secondary | ICD-10-CM | POA: Diagnosis not present

## 2022-02-25 NOTE — Progress Notes (Signed)
Left lower extremity venous duplex has been completed. Preliminary results can be found in CV Proc through chart review.  Results were given to Wells Guiles at [facility] NP office.  02/25/22 3:58 PM Cheryl Goodman RVT

## 2022-03-02 DIAGNOSIS — E782 Mixed hyperlipidemia: Secondary | ICD-10-CM | POA: Diagnosis not present

## 2022-03-02 DIAGNOSIS — E538 Deficiency of other specified B group vitamins: Secondary | ICD-10-CM | POA: Diagnosis not present

## 2022-03-08 DIAGNOSIS — M545 Low back pain, unspecified: Secondary | ICD-10-CM | POA: Diagnosis not present

## 2022-03-08 DIAGNOSIS — Z1389 Encounter for screening for other disorder: Secondary | ICD-10-CM | POA: Diagnosis not present

## 2022-03-08 DIAGNOSIS — E559 Vitamin D deficiency, unspecified: Secondary | ICD-10-CM | POA: Diagnosis not present

## 2022-03-08 DIAGNOSIS — Z0000 Encounter for general adult medical examination without abnormal findings: Secondary | ICD-10-CM | POA: Diagnosis not present

## 2022-03-08 DIAGNOSIS — E782 Mixed hyperlipidemia: Secondary | ICD-10-CM | POA: Diagnosis not present

## 2022-03-08 DIAGNOSIS — E538 Deficiency of other specified B group vitamins: Secondary | ICD-10-CM | POA: Diagnosis not present

## 2022-03-11 ENCOUNTER — Other Ambulatory Visit: Payer: Self-pay

## 2022-03-11 NOTE — Patient Outreach (Signed)
[facility]) Care Management  03/11/2022  Cheryl Goodman 1941-02-26 [id number]   Multiple attempts to contact patient without success. No response from letter mailed to patient.   Plan: RN CM will close case at this time.   Bary Leriche, RN, MSN [facility] Care Management Care Management Coordinator Direct Line [phone] Toll Free: [phone]  Fax: [phone]

## 2022-03-29 DIAGNOSIS — M5414 Radiculopathy, thoracic region: Secondary | ICD-10-CM | POA: Diagnosis not present

## 2022-03-29 DIAGNOSIS — K219 Gastro-esophageal reflux disease without esophagitis: Secondary | ICD-10-CM | POA: Diagnosis not present

## 2022-05-06 ENCOUNTER — Ambulatory Visit: Payer: Self-pay

## 2022-06-22 ENCOUNTER — Ambulatory Visit
Admission: RE | Admit: 2022-06-22 | Discharge: 2022-06-22 | Disposition: A | Discharge status: Home / Self Care | Payer: Medicare HMO | Source: Ambulatory Visit | Attending: Physician Assistant | Admitting: Physician Assistant

## 2022-06-22 ENCOUNTER — Other Ambulatory Visit: Payer: Self-pay | Admitting: Physician Assistant

## 2022-06-22 DIAGNOSIS — M1712 Unilateral primary osteoarthritis, left knee: Secondary | ICD-10-CM | POA: Diagnosis not present

## 2022-06-22 DIAGNOSIS — Z86718 Personal history of other venous thrombosis and embolism: Secondary | ICD-10-CM | POA: Insufficient documentation

## 2022-06-22 DIAGNOSIS — M79605 Pain in left leg: Secondary | ICD-10-CM

## 2022-06-22 DIAGNOSIS — R5383 Other fatigue: Secondary | ICD-10-CM | POA: Diagnosis not present

## 2022-06-22 DIAGNOSIS — R600 Localized edema: Secondary | ICD-10-CM

## 2022-06-22 DIAGNOSIS — M25562 Pain in left knee: Secondary | ICD-10-CM | POA: Insufficient documentation

## 2022-07-26 DIAGNOSIS — H3589 Other specified retinal disorders: Secondary | ICD-10-CM | POA: Diagnosis not present

## 2022-07-26 DIAGNOSIS — H25811 Combined forms of age-related cataract, right eye: Secondary | ICD-10-CM | POA: Diagnosis not present

## 2022-07-26 DIAGNOSIS — H26492 Other secondary cataract, left eye: Secondary | ICD-10-CM | POA: Diagnosis not present

## 2022-08-19 ENCOUNTER — Ambulatory Visit
Admission: RE | Admit: 2022-08-19 | Discharge: 2022-08-19 | Disposition: A | Discharge status: Home / Self Care | Payer: Medicare HMO | Source: Ambulatory Visit | Attending: Internal Medicine | Admitting: Internal Medicine

## 2022-08-19 ENCOUNTER — Other Ambulatory Visit: Payer: Self-pay | Admitting: Internal Medicine

## 2022-08-19 DIAGNOSIS — R4781 Slurred speech: Secondary | ICD-10-CM | POA: Diagnosis not present

## 2022-08-19 DIAGNOSIS — I639 Cerebral infarction, unspecified: Secondary | ICD-10-CM | POA: Diagnosis not present

## 2022-08-19 DIAGNOSIS — R29810 Facial weakness: Secondary | ICD-10-CM | POA: Diagnosis not present

## 2022-08-19 DIAGNOSIS — R42 Dizziness and giddiness: Secondary | ICD-10-CM | POA: Diagnosis not present

## 2022-08-19 MED ORDER — GADOBUTROL 1 MMOL/ML IV SOLN
8.0000 mL | Freq: Once | INTRAVENOUS | Status: AC | PRN
  Administered 2022-08-19: 8 mL via INTRAVENOUS

## 2022-09-07 DIAGNOSIS — E782 Mixed hyperlipidemia: Secondary | ICD-10-CM | POA: Diagnosis not present

## 2022-09-07 DIAGNOSIS — E559 Vitamin D deficiency, unspecified: Secondary | ICD-10-CM | POA: Diagnosis not present

## 2022-09-07 DIAGNOSIS — E538 Deficiency of other specified B group vitamins: Secondary | ICD-10-CM | POA: Diagnosis not present

## 2022-09-15 DIAGNOSIS — Z8673 Personal history of transient ischemic attack (TIA), and cerebral infarction without residual deficits: Secondary | ICD-10-CM | POA: Diagnosis not present

## 2022-09-15 DIAGNOSIS — M1712 Unilateral primary osteoarthritis, left knee: Secondary | ICD-10-CM | POA: Diagnosis not present

## 2022-09-15 DIAGNOSIS — Z0000 Encounter for general adult medical examination without abnormal findings: Secondary | ICD-10-CM | POA: Diagnosis not present

## 2022-09-16 ENCOUNTER — Other Ambulatory Visit: Payer: Self-pay | Admitting: Internal Medicine

## 2022-09-16 DIAGNOSIS — I639 Cerebral infarction, unspecified: Secondary | ICD-10-CM

## 2022-09-18 ENCOUNTER — Inpatient Hospital Stay (HOSPITAL_COMMUNITY)
Admission: EM | Admit: 2022-09-18 | Discharge: 2022-09-26 | DRG: 377 | Disposition: A | Discharge status: Home / Self Care | Payer: Medicare HMO | Attending: Internal Medicine | Admitting: Internal Medicine

## 2022-09-18 ENCOUNTER — Encounter (HOSPITAL_COMMUNITY): Payer: Self-pay | Admitting: Internal Medicine

## 2022-09-18 ENCOUNTER — Emergency Department (HOSPITAL_COMMUNITY): Payer: Medicare HMO

## 2022-09-18 ENCOUNTER — Other Ambulatory Visit: Payer: Self-pay

## 2022-09-18 DIAGNOSIS — I491 Atrial premature depolarization: Secondary | ICD-10-CM | POA: Diagnosis not present

## 2022-09-18 DIAGNOSIS — B961 Klebsiella pneumoniae [K. pneumoniae] as the cause of diseases classified elsewhere: Secondary | ICD-10-CM | POA: Diagnosis present

## 2022-09-18 DIAGNOSIS — Z86711 Personal history of pulmonary embolism: Secondary | ICD-10-CM

## 2022-09-18 DIAGNOSIS — Z9049 Acquired absence of other specified parts of digestive tract: Secondary | ICD-10-CM

## 2022-09-18 DIAGNOSIS — I6782 Cerebral ischemia: Secondary | ICD-10-CM | POA: Diagnosis not present

## 2022-09-18 DIAGNOSIS — Z87442 Personal history of urinary calculi: Secondary | ICD-10-CM

## 2022-09-18 DIAGNOSIS — I119 Hypertensive heart disease without heart failure: Secondary | ICD-10-CM | POA: Diagnosis present

## 2022-09-18 DIAGNOSIS — R1084 Generalized abdominal pain: Secondary | ICD-10-CM | POA: Diagnosis not present

## 2022-09-18 DIAGNOSIS — D649 Anemia, unspecified: Secondary | ICD-10-CM | POA: Diagnosis not present

## 2022-09-18 DIAGNOSIS — I2699 Other pulmonary embolism without acute cor pulmonale: Secondary | ICD-10-CM | POA: Diagnosis not present

## 2022-09-18 DIAGNOSIS — I10 Essential (primary) hypertension: Secondary | ICD-10-CM | POA: Diagnosis present

## 2022-09-18 DIAGNOSIS — G4733 Obstructive sleep apnea (adult) (pediatric): Secondary | ICD-10-CM | POA: Diagnosis present

## 2022-09-18 DIAGNOSIS — Z885 Allergy status to narcotic agent status: Secondary | ICD-10-CM | POA: Diagnosis not present

## 2022-09-18 DIAGNOSIS — K922 Gastrointestinal hemorrhage, unspecified: Secondary | ICD-10-CM | POA: Diagnosis not present

## 2022-09-18 DIAGNOSIS — Z86718 Personal history of other venous thrombosis and embolism: Secondary | ICD-10-CM

## 2022-09-18 DIAGNOSIS — I6622 Occlusion and stenosis of left posterior cerebral artery: Secondary | ICD-10-CM | POA: Diagnosis not present

## 2022-09-18 DIAGNOSIS — K2211 Ulcer of esophagus with bleeding: Secondary | ICD-10-CM | POA: Diagnosis not present

## 2022-09-18 DIAGNOSIS — K254 Chronic or unspecified gastric ulcer with hemorrhage: Principal | ICD-10-CM | POA: Diagnosis present

## 2022-09-18 DIAGNOSIS — N390 Urinary tract infection, site not specified: Secondary | ICD-10-CM | POA: Diagnosis not present

## 2022-09-18 DIAGNOSIS — E869 Volume depletion, unspecified: Secondary | ICD-10-CM | POA: Diagnosis present

## 2022-09-18 DIAGNOSIS — Z888 Allergy status to other drugs, medicaments and biological substances status: Secondary | ICD-10-CM

## 2022-09-18 DIAGNOSIS — B9689 Other specified bacterial agents as the cause of diseases classified elsewhere: Secondary | ICD-10-CM | POA: Diagnosis present

## 2022-09-18 DIAGNOSIS — K921 Melena: Principal | ICD-10-CM

## 2022-09-18 DIAGNOSIS — K2210 Ulcer of esophagus without bleeding: Secondary | ICD-10-CM | POA: Diagnosis not present

## 2022-09-18 DIAGNOSIS — R001 Bradycardia, unspecified: Secondary | ICD-10-CM | POA: Diagnosis not present

## 2022-09-18 DIAGNOSIS — K2971 Gastritis, unspecified, with bleeding: Secondary | ICD-10-CM | POA: Diagnosis not present

## 2022-09-18 DIAGNOSIS — Z8673 Personal history of transient ischemic attack (TIA), and cerebral infarction without residual deficits: Secondary | ICD-10-CM | POA: Diagnosis not present

## 2022-09-18 DIAGNOSIS — Z880 Allergy status to penicillin: Secondary | ICD-10-CM

## 2022-09-18 DIAGNOSIS — K219 Gastro-esophageal reflux disease without esophagitis: Secondary | ICD-10-CM | POA: Diagnosis present

## 2022-09-18 DIAGNOSIS — R52 Pain, unspecified: Secondary | ICD-10-CM | POA: Diagnosis not present

## 2022-09-18 DIAGNOSIS — Z1152 Encounter for screening for COVID-19: Secondary | ICD-10-CM

## 2022-09-18 DIAGNOSIS — Z7902 Long term (current) use of antithrombotics/antiplatelets: Secondary | ICD-10-CM

## 2022-09-18 DIAGNOSIS — Z882 Allergy status to sulfonamides status: Secondary | ICD-10-CM

## 2022-09-18 DIAGNOSIS — I6523 Occlusion and stenosis of bilateral carotid arteries: Secondary | ICD-10-CM | POA: Diagnosis not present

## 2022-09-18 DIAGNOSIS — Z79899 Other long term (current) drug therapy: Secondary | ICD-10-CM | POA: Diagnosis not present

## 2022-09-18 DIAGNOSIS — D500 Iron deficiency anemia secondary to blood loss (chronic): Secondary | ICD-10-CM | POA: Diagnosis not present

## 2022-09-18 DIAGNOSIS — Z8711 Personal history of peptic ulcer disease: Secondary | ICD-10-CM

## 2022-09-18 DIAGNOSIS — K2970 Gastritis, unspecified, without bleeding: Secondary | ICD-10-CM | POA: Diagnosis present

## 2022-09-18 DIAGNOSIS — D62 Acute posthemorrhagic anemia: Secondary | ICD-10-CM | POA: Diagnosis present

## 2022-09-18 DIAGNOSIS — K5790 Diverticulosis of intestine, part unspecified, without perforation or abscess without bleeding: Secondary | ICD-10-CM | POA: Diagnosis present

## 2022-09-18 DIAGNOSIS — Z96651 Presence of right artificial knee joint: Secondary | ICD-10-CM | POA: Diagnosis present

## 2022-09-18 DIAGNOSIS — G4730 Sleep apnea, unspecified: Secondary | ICD-10-CM | POA: Diagnosis not present

## 2022-09-18 DIAGNOSIS — K449 Diaphragmatic hernia without obstruction or gangrene: Secondary | ICD-10-CM | POA: Diagnosis present

## 2022-09-18 DIAGNOSIS — N19 Unspecified kidney failure: Secondary | ICD-10-CM | POA: Diagnosis not present

## 2022-09-18 DIAGNOSIS — N179 Acute kidney failure, unspecified: Secondary | ICD-10-CM | POA: Diagnosis not present

## 2022-09-18 DIAGNOSIS — Z7982 Long term (current) use of aspirin: Secondary | ICD-10-CM

## 2022-09-18 DIAGNOSIS — E782 Mixed hyperlipidemia: Secondary | ICD-10-CM | POA: Diagnosis not present

## 2022-09-18 DIAGNOSIS — Z803 Family history of malignant neoplasm of breast: Secondary | ICD-10-CM

## 2022-09-18 DIAGNOSIS — Z90710 Acquired absence of both cervix and uterus: Secondary | ICD-10-CM

## 2022-09-18 DIAGNOSIS — E785 Hyperlipidemia, unspecified: Secondary | ICD-10-CM | POA: Diagnosis present

## 2022-09-18 LAB — CBC WITH DIFFERENTIAL/PLATELET
Abs Immature Granulocytes: 0.44 10*3/uL — ABNORMAL HIGH (ref 0.00–0.07)
Basophils Absolute: 0 10*3/uL (ref 0.0–0.1)
Basophils Relative: 0 %
Eosinophils Absolute: 0 10*3/uL (ref 0.0–0.5)
Eosinophils Relative: 0 %
HCT: 31.1 % — ABNORMAL LOW (ref 36.0–46.0)
Hemoglobin: 9.8 g/dL — ABNORMAL LOW (ref 12.0–15.0)
Immature Granulocytes: 2 %
Lymphocytes Relative: 12 %
Lymphs Abs: 2.6 10*3/uL (ref 0.7–4.0)
MCH: 28.2 pg (ref 26.0–34.0)
MCHC: 31.5 g/dL (ref 30.0–36.0)
MCV: 89.4 fL (ref 80.0–100.0)
Monocytes Absolute: 2 10*3/uL — ABNORMAL HIGH (ref 0.1–1.0)
Monocytes Relative: 9 %
Neutro Abs: 15.9 10*3/uL — ABNORMAL HIGH (ref 1.7–7.7)
Neutrophils Relative %: 77 %
Platelets: 379 10*3/uL (ref 150–400)
RBC: 3.48 MIL/uL — ABNORMAL LOW (ref 3.87–5.11)
RDW: 14.8 % (ref 11.5–15.5)
WBC: 20.9 10*3/uL — ABNORMAL HIGH (ref 4.0–10.5)
nRBC: 0.1 % (ref 0.0–0.2)

## 2022-09-18 LAB — POC OCCULT BLOOD, ED: Fecal Occult Bld: POSITIVE — AB

## 2022-09-18 LAB — URINALYSIS, ROUTINE W REFLEX MICROSCOPIC
Bilirubin Urine: NEGATIVE
Glucose, UA: NEGATIVE mg/dL
Hgb urine dipstick: NEGATIVE
Ketones, ur: NEGATIVE mg/dL
Nitrite: NEGATIVE
Protein, ur: NEGATIVE mg/dL
Specific Gravity, Urine: 1.023 (ref 1.005–1.030)
pH: 5 (ref 5.0–8.0)

## 2022-09-18 LAB — COMPREHENSIVE METABOLIC PANEL
ALT: 17 U/L (ref 0–44)
AST: 16 U/L (ref 15–41)
Albumin: 3.5 g/dL (ref 3.5–5.0)
Alkaline Phosphatase: 55 U/L (ref 38–126)
Anion gap: 8 (ref 5–15)
BUN: 64 mg/dL — ABNORMAL HIGH (ref 8–23)
CO2: 23 mmol/L (ref 22–32)
Calcium: 8.8 mg/dL — ABNORMAL LOW (ref 8.9–10.3)
Chloride: 105 mmol/L (ref 98–111)
Creatinine, Ser: 1.46 mg/dL — ABNORMAL HIGH (ref 0.44–1.00)
GFR, Estimated: 36 mL/min — ABNORMAL LOW (ref >60)
Glucose, Bld: 146 mg/dL — ABNORMAL HIGH (ref 70–99)
Potassium: 4.1 mmol/L (ref 3.5–5.1)
Sodium: 136 mmol/L (ref 135–145)
Total Bilirubin: 0.5 mg/dL (ref 0.3–1.2)
Total Protein: 6.6 g/dL (ref 6.5–8.1)

## 2022-09-18 LAB — LIPASE, BLOOD: Lipase: 41 U/L (ref 11–51)

## 2022-09-18 LAB — RESP PANEL BY RT-PCR (RSV, FLU A&B, COVID)  RVPGX2
Influenza A by PCR: NEGATIVE
Influenza B by PCR: NEGATIVE
Resp Syncytial Virus by PCR: NEGATIVE
SARS Coronavirus 2 by RT PCR: NEGATIVE

## 2022-09-18 LAB — TYPE AND SCREEN
ABO/RH(D): B NEG
Antibody Screen: NEGATIVE

## 2022-09-18 MED ORDER — IOHEXOL 350 MG/ML SOLN
75.0000 mL | Freq: Once | INTRAVENOUS | Status: AC | PRN
  Administered 2022-09-18: 75 mL via INTRAVENOUS

## 2022-09-18 MED ORDER — PANTOPRAZOLE SODIUM 40 MG IV SOLR
40.0000 mg | Freq: Two times a day (BID) | INTRAVENOUS | Status: DC
  Administered 2022-09-18 – 2022-09-21 (×6): 40 mg via INTRAVENOUS
  Filled 2022-09-18 (×6): qty 10

## 2022-09-18 MED ORDER — PANTOPRAZOLE SODIUM 40 MG IV SOLR
40.0000 mg | Freq: Once | INTRAVENOUS | Status: AC
  Administered 2022-09-18: 40 mg via INTRAVENOUS
  Filled 2022-09-18: qty 10

## 2022-09-18 MED ORDER — POLYETHYLENE GLYCOL 3350 17 G PO PACK
17.0000 g | PACK | Freq: Every day | ORAL | Status: DC | PRN
  Filled 2022-09-18: qty 1

## 2022-09-18 MED ORDER — HYDRALAZINE HCL 20 MG/ML IJ SOLN: 10.0000 mg | Freq: Four times a day (QID) | INTRAMUSCULAR | Status: DC | PRN

## 2022-09-18 MED ORDER — SIMVASTATIN 20 MG PO TABS
20.0000 mg | ORAL_TABLET | Freq: Every day | ORAL | Status: DC
  Administered 2022-09-18 – 2022-09-25 (×8): 20 mg via ORAL
  Filled 2022-09-18 (×8): qty 1

## 2022-09-18 MED ORDER — LACTATED RINGERS IV SOLN: INTRAVENOUS | Status: AC

## 2022-09-18 MED ORDER — ONDANSETRON HCL 4 MG/2ML IJ SOLN: 4.0000 mg | Freq: Four times a day (QID) | INTRAMUSCULAR | Status: DC | PRN

## 2022-09-18 MED ORDER — ACETAMINOPHEN 650 MG RE SUPP: 650.0000 mg | Freq: Four times a day (QID) | RECTAL | Status: DC | PRN

## 2022-09-18 MED ORDER — ACETAMINOPHEN 325 MG PO TABS
650.0000 mg | ORAL_TABLET | Freq: Four times a day (QID) | ORAL | Status: DC | PRN
  Administered 2022-09-19 (×2): 650 mg via ORAL
  Filled 2022-09-18 (×2): qty 2

## 2022-09-18 MED ORDER — SODIUM CHLORIDE 0.9 % IV SOLN: Freq: Once | INTRAVENOUS | Status: AC

## 2022-09-18 MED ORDER — ORAL CARE MOUTH RINSE: 15.0000 mL | OROMUCOSAL | Status: DC | PRN

## 2022-09-18 MED ORDER — ONDANSETRON HCL 4 MG PO TABS: 4.0000 mg | ORAL_TABLET | Freq: Four times a day (QID) | ORAL | Status: DC | PRN

## 2022-09-18 NOTE — Hospital Course (Addendum)
81 year old female with past medical history of gastroesophageal reflux disease, hypertension, hyperlipidemia, obstructive sleep apnea and pertinent history of upper GI bleeding secondary to erosive gastritis requiring hospitalization (treated at [facility] 07/2019) followed by submassive bilateral pulmonary emboli and left lower extremity DVT also requiring hospitalization (also during 07/2019 treated with 6 months of Eliquis successfully).  Finally, patient also has a recent diagnosis of right corona radiata stroke diagnosed 08/19/2022 as an outpatient via MRI brain in [place] [facility] managed as an outpatient with aspirin and Plavix.  Patient now presents to [facility] emergency department with complaints of 4 days of black tarry stool.  Patient complains of some abdominal pain over this same span of time, mostly in the lower left abdomen . Mild / moderate in intensity, non radiating and cramping in quality.  Patient is also complaining of progressively worsening generalized weakness over the same span of time, moderate to severe in intensity, worse with exertion and improved with rest as well as associated lightheadedness.  Patient denies chest pain or shortness of breath.  Patient does occasionally take Ibuprofen and denies alcohol use.    Upon evaluation in the emergency department CT angiogram of the abdomen pelvis was performed revealing no active extravasation to suggest bleeding.  Patient was initiated on intravenous Protonix.  EDP sent a secure chat to [facility] gastroenterology, on-call for unassigned gastroenterology patient's and the hospitalist group has now been called to assess the patient for admission the hospital.

## 2022-09-18 NOTE — Assessment & Plan Note (Addendum)
Currently normotensive As needed intravenous antihypertensives for markedly elevated blood pressure.

## 2022-09-18 NOTE — Assessment & Plan Note (Signed)
.   Continuing home regimen of lipid lowering therapy.  

## 2022-09-18 NOTE — ED Provider Notes (Signed)
[facility]-EMERGENCY DEPT Provider Note   CSN: [id number] Arrival date & time: 09/18/22  1337     History  Chief Complaint  Patient presents with   Hematochezia   Abdominal Pain    Cheryl Goodman is a 81 y.o. female.  GI bleed 81 year old female with prior medical history as detailed below presents for evaluation.  Patient reports that she has noticed black, dark, tarry stools x 4 days.  Today she started to feel lightheaded and weak.  She has a history of stomach ulcers.  She is currently taking both aspirin and Plavix.  She was diagnosed with a stroke approximately 1 month ago and Plavix was added to her medication regimen then.  She has a history of diverticulosis.  She complains of vague intermittent right lower abdominal cramping discomfort x 2 days.  She denies fever.  She reports some mild nausea.  She denies gross blood seen in her stool.  The history is provided by the patient and medical records.       Home Medications Prior to Admission medications   Medication Sig Start Date End Date Taking? Authorizing Provider  aspirin EC 81 MG tablet Take 81 mg by mouth daily. Swallow whole.    [provider]  Biotin 1000 MCG tablet Take 1,000 mcg by mouth daily.    [provider]  Cholecalciferol (VITAMIN D3) 125 MCG (5000 UT) TABS Take 5,000 Units by mouth daily.    [provider]  hyoscyamine (ANASPAZ) 0.125 MG TBDP disintergrating tablet Place 0.125 mg under the tongue every 6 (six) hours as needed for cramping.    [provider]  Iron-Vitamin C (VITRON-C) 65-125 MG TABS Take 1 tablet by mouth daily.    [provider]  metoCLOPramide (REGLAN) 5 MG tablet Take 5 mg by mouth at bedtime.    [provider]  omeprazole (PRILOSEC) 40 MG capsule Take 40 mg by mouth 2 (two) times daily.    [provider]  omeprazole (PRILOSEC) 40 MG capsule Take 1 capsule (40 mg total) by mouth 2 (two)  times daily for 14 days. 08/14/19 08/28/19  Pasty Spillers, MD  polyethylene glycol (MIRALAX / GLYCOLAX) 17 g packet Take 17 g by mouth daily. 08/07/19   Rodolph Bong, MD  sucralfate (CARAFATE) 1 g tablet Take 1 g by mouth 2 (two) times daily.    [provider]  torsemide (DEMADEX) 20 MG tablet Take 20 mg by mouth daily.    [provider]  traMADol (ULTRAM) 50 MG tablet Take 50 mg by mouth every 6 (six) hours as needed for moderate pain.    [provider]  triamterene-hydrochlorothiazide (DYAZIDE) 37.5-25 MG capsule Take 1 capsule by mouth daily.    [provider]      Allergies    Penicillins, Ace inhibitors, Dexilant [dexlansoprazole], Oxycodone, and Sulfa antibiotics    Review of Systems   Review of Systems  All other systems reviewed and are negative.   Physical Exam Updated Vital Signs BP 137/79 (BP Location: Left Arm)   Pulse 93   Temp 98.2 F (36.8 C) (Oral)   Resp (!) 22   Ht 4\' 11"  (1.499 m)   Wt 83.9 kg   SpO2 97%   BMI 37.37 kg/m  Physical Exam Vitals and nursing note reviewed.  Constitutional:      General: She is not in acute distress.    Appearance: Normal appearance. She is well-developed.  HENT:  Head: Normocephalic and atraumatic.  Eyes:     Conjunctiva/sclera: Conjunctivae normal.     Pupils: Pupils are equal, round, and reactive to light.  Cardiovascular:     Rate and Rhythm: Normal rate and regular rhythm.     Heart sounds: Normal heart sounds.  Pulmonary:     Effort: Pulmonary effort is normal. No respiratory distress.     Breath sounds: Normal breath sounds.  Abdominal:     General: There is no distension.     Palpations: Abdomen is soft.     Tenderness: There is abdominal tenderness in the right lower quadrant.  Genitourinary:    Comments: Dark, tarry stool present on DRE Musculoskeletal:        General: No deformity. Normal range of motion.     Cervical back: Normal range of motion and  neck supple.  Skin:    General: Skin is warm and dry.  Neurological:     General: No focal deficit present.     Mental Status: She is alert and oriented to person, place, and time.     ED Results / Procedures / Treatments   Labs (all labs ordered are listed, but only abnormal results are displayed) Labs Reviewed  POC OCCULT BLOOD, ED - Abnormal; Notable for the following components:      Result Value   Fecal Occult Bld POSITIVE (*)    All other components within normal limits  RESP PANEL BY RT-PCR (RSV, FLU A&B, COVID)  [id number]  CBC WITH DIFFERENTIAL/PLATELET  COMPREHENSIVE METABOLIC PANEL  LIPASE, BLOOD  TYPE AND SCREEN    EKG EKG Interpretation  Date/Time:  Saturday September 18 2022 13:49:39 EST Ventricular Rate:  89 PR Interval:  171 QRS Duration: 104 QT Interval:  343 QTC Calculation: 418 R Axis:   132 Text Interpretation: Sinus rhythm Atrial premature complexes Right axis deviation Low voltage, precordial leads Borderline T abnormalities, anterior leads Confirmed by Kristine Royal [phone]) on 09/18/2022 2:02:42 PM  Radiology No results found.  Procedures Procedures    Medications Ordered in ED Medications  pantoprazole (PROTONIX) injection 40 mg (has no administration in time range)  0.9 %  sodium chloride infusion (has no administration in time range)    ED Course/ Medical Decision Making/ A&P                           Medical Decision Making Amount and/or Complexity of Data Reviewed Labs: ordered. Radiology: ordered.  Risk Prescription drug management. Decision regarding hospitalization.    Medical Screen Complete  This patient presented to the ED with complaint of melena.  This complaint involves an extensive number of treatment options. The initial differential diagnosis includes, but is not limited to, GI bleed, metabolic abnormality, anemia, etc.  This presentation is: Acute, Chronic, Self-Limited, Previously Undiagnosed, Uncertain  Prognosis, Complicated, Systemic Symptoms, and Threat to Life/Bodily Function  Patient presenting with complaint of 4 days of dark tarry stool.  Patient with symptoms concerning for anemia today.  Patient with melena on exam.  She is on both aspirin and Plavix.  Initial hemoglobin is 9.8.  CT angio of abdomen does not reveal active bleeding.  Patient would benefit from admission for further workup and treatment.  Hospitalist service made aware of case.    Additional history obtained:  External records from outside sources obtained and reviewed including prior ED visits and prior Inpatient records.    Lab Tests:  I ordered and personally interpreted labs.  The pertinent results include: CBC, CMP, lipase, type and screen   Imaging Studies ordered:  I ordered imaging studies including CT angio Abd/Pel I independently visualized and interpreted obtained imaging which showed no active bleeding I agree with the radiologist interpretation.   Cardiac Monitoring:  The patient was maintained on a cardiac monitor.  I personally viewed and interpreted the cardiac monitor which showed an underlying rhythm of: NSR   Medicines ordered:  I ordered medication including protonix  for suspected GI bleed  Reevaluation of the patient after these medicines showed that the patient: stayed the same    Problem List / ED Course:  Melena   Reevaluation:  After the interventions noted above, I reevaluated the patient and found that they have: stayed the same  Disposition:  After consideration of the diagnostic results and the patients response to treatment, I feel that the patent would benefit from admission.          Final Clinical Impression(s) / ED Diagnoses Final diagnoses:  Melena    Rx / DC Orders ED Discharge Orders     None         Wynetta Fines, MD 09/18/22 [phone]

## 2022-09-18 NOTE — ED Notes (Signed)
Patient transported to CT 

## 2022-09-18 NOTE — H&P (Addendum)
History and Physical    Patient: Cheryl Goodman MRN: [id number] DOA: 09/18/2022  Date of Service: the patient was seen and examined on 09/18/2022  Patient coming from: Home  Chief Complaint:  Chief Complaint  Patient presents with   Hematochezia   Abdominal Pain    HPI:   81 year old female with past medical history of gastroesophageal reflux disease, hypertension, hyperlipidemia, obstructive sleep apnea and pertinent history of upper GI bleeding secondary to erosive gastritis requiring hospitalization (treated at [facility] 07/2019) followed by submassive bilateral pulmonary emboli and left lower extremity DVT also requiring hospitalization (also during 07/2019 treated with 6 months of Eliquis successfully).  Finally, patient also has a recent diagnosis of right corona radiata stroke diagnosed 08/19/2022 as an outpatient via MRI brain in [place] clinic managed as an outpatient with aspirin and Plavix.  Patient now presents to [facility] emergency department with complaints of 4 days of black tarry stool.  Patient complains of some abdominal pain over this same span of time, mostly in the lower left abdomen . Mild / moderate in intensity, non radiating and cramping in quality.  Patient is also complaining of progressively worsening generalized weakness over the same span of time, moderate to severe in intensity, worse with exertion and improved with rest as well as associated lightheadedness.  Patient denies chest pain or shortness of breath.  Patient does occasionally take Ibuprofen and denies alcohol use.    Upon evaluation in the emergency department CT angiogram of the abdomen pelvis was performed revealing no active extravasation to suggest bleeding.  Patient was initiated on intravenous Protonix.  EDP sent a secure chat to [facility] gastroenterology, on-call for unassigned gastroenterology patient's and the hospitalist group has now been called to assess the patient for admission  the hospital.   Review of Systems: Review of Systems  Constitutional:  Positive for malaise/fatigue.  Gastrointestinal:  Positive for melena.  Neurological:  Positive for weakness.  All other systems reviewed and are negative.    Past Medical History:  Diagnosis Date   Acute pulmonary embolism with acute cor pulmonale ([place]) 08/10/2019   Arthritis    Dysphagia    GERD (gastroesophageal reflux disease)    History of hiatal hernia    History of kidney stones    x2 ; passed independently   Hyperlipidemia    Hypertension    Nausea    OA (osteoarthritis) of knee 10/31/2017   Sleep apnea     Past Surgical History:  Procedure Laterality Date   ABDOMINAL HYSTERECTOMY     APPENDECTOMY     BREAST BIOPSY Right    BREAST SURGERY     CARPAL TUNNEL RELEASE     CHOLECYSTECTOMY     COLONOSCOPY WITH PROPOFOL N/A 09/01/2015   Procedure: COLONOSCOPY WITH PROPOFOL;  Surgeon: Manya Silvas, MD;  Location: [place];  Service: Endoscopy;  Laterality: N/A;   COLONOSCOPY WITH PROPOFOL N/A 04/29/2021   Procedure: COLONOSCOPY WITH PROPOFOL;  Surgeon: Robert Bellow, MD;  Location: [facility] ENDOSCOPY;  Service: Gastroenterology;  Laterality: N/A;   ESOPHAGOGASTRODUODENOSCOPY N/A 07/17/2019   Procedure: ESOPHAGOGASTRODUODENOSCOPY (EGD);  Surgeon: Virgel Manifold, MD;  Location[facility] ENDOSCOPY;  Service: Endoscopy;  Laterality: N/A;   ESOPHAGOGASTRODUODENOSCOPY (EGD) WITH PROPOFOL N/A 04/29/2021   Procedure: ESOPHAGOGASTRODUODENOSCOPY (EGD) WITH PROPOFOL;  Surgeon: Robert Bellow, MD;  Location: [facility] ENDOSCOPY;  Service: Gastroenterology;  Laterality: N/A;   REDUCTION MAMMAPLASTY Bilateral    SAVORY DILATION N/A 09/01/2015   Procedure: SAVORY DILATION;  Surgeon: Gavin Pound  Mechele Collin, MD;  Location: [facility] ENDOSCOPY;  Service: Endoscopy;  Laterality: N/A;   TOTAL KNEE ARTHROPLASTY Right 10/31/2017   Procedure: RIGHT TOTAL KNEE ARTHROPLASTY;  Surgeon: Ollen Gross, MD;  Location: [facility]S;   Service: Orthopedics;  Laterality: Right;    Social History:  reports that she has never smoked. She has never used smokeless tobacco. She reports that she does not drink alcohol and does not use drugs.  Allergies  Allergen Reactions   Penicillins Rash and Other (See Comments)    Has patient had a PCN reaction causing immediate rash, facial/tongue/throat swelling, SOB or lightheadedness with hypotension: No Has patient had a PCN reaction causing severe rash involving mucus membranes or skin necrosis: No Has patient had a PCN reaction that required hospitalization: No Has patient had a PCN reaction occurring within the last 10 years: No If all of the above answers are "NO", then may proceed with Cephalosporin use.    Sulfa Antibiotics Anaphylaxis and Swelling   Ace Inhibitors Cough   Dexlansoprazole Other (See Comments)    Syncope    Oxycodone Nausea Only    Family History  Problem Relation Age of Onset   Breast cancer Maternal Grandmother        in 70's   Heart disease Neg Hx     Prior to Admission medications   Medication Sig Start Date End Date Taking? Authorizing Provider  acetaminophen (TYLENOL) 325 MG tablet Take 325-650 mg by mouth every 6 (six) hours as needed for headache or mild pain.   Yes [provider]  aspirin EC 81 MG tablet Take 81 mg by mouth daily. Swallow whole.   Yes [provider]  Biotin 1000 MCG tablet Take 1,000 mcg by mouth at bedtime.   Yes [provider]  Cholecalciferol (VITAMIN D3) 125 MCG (5000 UT) TABS Take 5,000 Units by mouth daily.   Yes [provider]  clopidogrel (PLAVIX) 75 MG tablet Take 75 mg by mouth in the morning.   Yes [provider]  ibuprofen (ADVIL) 800 MG tablet Take 800 mg by mouth every 8 (eight) hours as needed for mild pain or headache.   Yes [provider]  NON FORMULARY Take 1 tablet by mouth See admin instructions. D-arginine tablets- Take 1 tablet by mouth at bedtime    Yes [provider]  pantoprazole (PROTONIX) 40 MG tablet Take 40 mg by mouth daily before breakfast.   Yes [provider]  simvastatin (ZOCOR) 20 MG tablet Take 20 mg by mouth at bedtime.   Yes [provider]  sucralfate (CARAFATE) 1 g tablet Take 1 g by mouth in the morning.   Yes [provider]  traMADol (ULTRAM) 50 MG tablet Take 50 mg by mouth in the morning.   Yes [provider]  triamterene-hydrochlorothiazide (DYAZIDE) 37.5-25 MG capsule Take 1 capsule by mouth daily.   Yes [provider]  omeprazole (PRILOSEC) 40 MG capsule Take 1 capsule (40 mg total) by mouth 2 (two) times daily for 14 days. Patient not taking: Reported on 09/18/2022 08/14/19 09/18/22  Pasty Spillers, MD  polyethylene glycol (MIRALAX / GLYCOLAX) 17 g packet Take 17 g by mouth daily. Patient not taking: Reported on 09/18/2022 08/07/19   Rodolph Bong, MD  torsemide (DEMADEX) 20 MG tablet Take 20 mg by mouth daily.    [provider]    Physical Exam:  Vitals:   09/18/22 1500 09/18/22 1600 09/18/22 1830 09/18/22 2040  BP: 117/67 121/66 128/72  133/61  Pulse: 82 81 80 72  Resp: 17 17 17 20   Temp:   98.1 F (36.7 C) 98.7 F (37.1 C)  TempSrc:    Oral  SpO2: 95% 96% 98% 97%  Weight:      Height:        Constitutional: Awake alert and oriented x3, no associated distress.   Skin: no rashes, no lesions, good skin turgor noted. Eyes: Pupils are equally reactive to light.  No evidence of scleral icterus or conjunctival pallor.  ENMT: Moist mucous membranes noted.  Posterior pharynx clear of any exudate or lesions.   Neck: normal, supple, no masses, no thyromegaly.  No evidence of jugular venous distension.   Respiratory: clear to auscultation bilaterally, no wheezing, no crackles. Normal respiratory effort. No accessory muscle use.  Cardiovascular: Regular rate and rhythm, no murmurs / rubs / gallops. No extremity edema. 2+ pedal pulses.  No carotid bruits.  Chest:   Nontender without crepitus or deformity.   Back:   Nontender without crepitus or deformity. Abdomen: Abdomen is soft and nontender.  No evidence of intra-abdominal masses.  Positive bowel sounds noted in all quadrants.   Musculoskeletal: No joint deformity upper and lower extremities. Good ROM, no contractures. Normal muscle tone.  Neurologic: CN 2-12 grossly intact. Sensation intact.  Patient moving all 4 extremities spontaneously.  Patient is following all commands.  Patient is responsive to verbal stimuli.   Psychiatric: Patient exhibits normal mood with appropriate affect.  Patient seems to possess insight as to their current situation.    Data Reviewed:  I have personally reviewed and interpreted labs, imaging.  Significant findings are   Lab Results  Component Value Date   WBC 20.9 (H) 09/18/2022   HGB 9.8 (L) 09/18/2022   HCT 31.1 (L) 09/18/2022   MCV 89.4 09/18/2022   PLT 379 09/18/2022   Lab Results  Component Value Date   K 4.1 09/18/2022   Lab Results  Component Value Date   BUN 64 (H) 09/18/2022   Lab Results  Component Value Date   CREATININE 1.46 (H) 09/18/2022     EKG: Personally reviewed.  Rhythm is normal sinus rhythm with heart rate of 89 bpm.  No dynamic ST segment changes appreciated.    Assessment and Plan: * Acute upper GI bleed Considering markedly elevated BUN and complaints of melena I would suspect this is an upper gastrointestinal bleed however patient's tenderness is suprisingly in the left lower quadrant on exam.  There is evidence of diverticulosis on the CT abdomen but no diverticulitis considering the tenderness.  Clear liquid diet for now Holding all outpatient anticoagulation such as aspirin and Plavix Monitoring hemoglobin and hematocrit with serial CBCs Will transfuse for active gross bleeding or hemoglobin of less than 7 Patient is been placed on Protonix 40 mg IV every 12 hours N.p.o. after midnight in  case of endoscopic intervention in the morning GI provider has sent a secure chat to Dr. Alessandra Bevels with [facility] gastroenterology requesting consultation tomorrow morning  AKI (acute kidney injury) [facility]) Patient exhibiting evidence of acute kidney injury secondary to volume depletion Considering leukocytosis, will obtain urinalysis to ensure there is no evidence of urinary tract infection. Hydrating patient with intravenous isotonic fluids. Strict input and output monitoring Monitoring renal function and electrolytes with serial chemistries Avoiding nephrotoxic agents if at all possible   History of ischemic stroke Recent diagnosis of corona radiata stroke 1 month ago in the outpatient setting at [facility] clinic Patient has since been  on aspirin and Plavix, will hold these at this time in the setting of suspected upper gastrointestinal bleeding  Essential hypertension Currently normotensive As needed intravenous antihypertensives for markedly elevated blood pressure.  Mixed hyperlipidemia Continuing home regimen of lipid lowering therapy.   Gastroesophageal reflux disease On intravenous proton pump inhibitor as noted above.       Code Status:  Full code  code status decision has been confirmed with: patient   Consults: EDP sent secure chat to Dr. Alessandra Bevels with [facility] gastroenterology requesting consultation in the morning.  Severity of Illness:  The appropriate patient status for this patient is OBSERVATION. Observation status is judged to be reasonable and necessary in order to provide the required intensity of service to ensure the patient's safety. The patient's presenting symptoms, physical exam findings, and initial radiographic and laboratory data in the context of their medical condition is felt to place them at decreased risk for further clinical deterioration. Furthermore, it is anticipated that the patient will be medically stable for discharge from the hospital within  2 midnights of admission.   Author:  Vernelle Emerald MD  09/18/2022 9:38 PM

## 2022-09-18 NOTE — Assessment & Plan Note (Addendum)
Patient reports continued bouts of melena Hemoglobin remained stable essentially, currently 8.0 down from 8.3 yesterday. EGD found small erosions in the cardia and the hernia sac consistent with Cameron's erosions. Diet initiated Transitioning Protonix to oral 40 mg twice daily Will continue to monitor overnight due to patient's reports of ongoing melena, possible discharge tomorrow Continuing monitor hemoglobin via serial CBCs GI states that they support resuming patient's outpatient antiplatelet therapy.

## 2022-09-18 NOTE — Assessment & Plan Note (Signed)
Recent diagnosis of corona radiata stroke 1 month ago in the outpatient setting in mid November at the [facility] clinic Patient was placed on aspirin and Plavix at that time but did not undergo a full stroke workup.   I discussed this case with Dr. Derry Lory with neurology in an effort to determine if dual antiplatelet therapy was still needed. Neurology states that patient may be treated with aspirin monotherapy once remainder of stroke workup is complete including echocardiogram, CT angiogram of the head and neck, lipid panel and hemoglobin A1c.  Will order remainder of workup with intent to resume aspirin 81 mg monotherapy tomorrow assuming there are no bleeding complications.

## 2022-09-18 NOTE — Assessment & Plan Note (Signed)
Patient has been transition to an oral proton pump inhibitor as noted above.

## 2022-09-18 NOTE — ED Triage Notes (Signed)
Pt BIBA from home for black, tarry stools x4 days, today felt weak and dizzy after lunch. Intermittent RLQ abd pain. Hx ulcers. On plavix for mini stroke 1 month ago. 20ga RAC.  150/76 HR 90 97% RA CBG 179

## 2022-09-18 NOTE — Assessment & Plan Note (Addendum)
Patient exhibiting evidence of acute kidney injury secondary to volume depletion Considering leukocytosis, will obtain urinalysis to ensure there is no evidence of urinary tract infection. Hydrating patient with intravenous isotonic fluids. Strict input and output monitoring Monitoring renal function and electrolytes with serial chemistries Avoiding nephrotoxic agents if at all possible

## 2022-09-19 DIAGNOSIS — Z8673 Personal history of transient ischemic attack (TIA), and cerebral infarction without residual deficits: Secondary | ICD-10-CM | POA: Diagnosis not present

## 2022-09-19 DIAGNOSIS — E782 Mixed hyperlipidemia: Secondary | ICD-10-CM | POA: Diagnosis not present

## 2022-09-19 DIAGNOSIS — K921 Melena: Secondary | ICD-10-CM | POA: Diagnosis not present

## 2022-09-19 DIAGNOSIS — N179 Acute kidney failure, unspecified: Secondary | ICD-10-CM | POA: Diagnosis not present

## 2022-09-19 DIAGNOSIS — I10 Essential (primary) hypertension: Secondary | ICD-10-CM | POA: Diagnosis not present

## 2022-09-19 DIAGNOSIS — K219 Gastro-esophageal reflux disease without esophagitis: Secondary | ICD-10-CM | POA: Diagnosis not present

## 2022-09-19 DIAGNOSIS — K922 Gastrointestinal hemorrhage, unspecified: Secondary | ICD-10-CM | POA: Diagnosis not present

## 2022-09-19 DIAGNOSIS — D500 Iron deficiency anemia secondary to blood loss (chronic): Secondary | ICD-10-CM | POA: Diagnosis not present

## 2022-09-19 LAB — CBC
HCT: 27.3 % — ABNORMAL LOW (ref 36.0–46.0)
HCT: 27.2 % — ABNORMAL LOW (ref 36.0–46.0)
Hemoglobin: 8.5 g/dL — ABNORMAL LOW (ref 12.0–15.0)
Hemoglobin: 8.4 g/dL — ABNORMAL LOW (ref 12.0–15.0)
MCH: 27.7 pg (ref 26.0–34.0)
MCH: 28.1 pg (ref 26.0–34.0)
MCHC: 31.1 g/dL (ref 30.0–36.0)
MCHC: 30.9 g/dL (ref 30.0–36.0)
MCV: 88.9 fL (ref 80.0–100.0)
MCV: 91 fL (ref 80.0–100.0)
Platelets: 307 10*3/uL (ref 150–400)
Platelets: 296 10*3/uL (ref 150–400)
RBC: 3.07 MIL/uL — ABNORMAL LOW (ref 3.87–5.11)
RBC: 2.99 MIL/uL — ABNORMAL LOW (ref 3.87–5.11)
RDW: 14.8 % (ref 11.5–15.5)
RDW: 15 % (ref 11.5–15.5)
WBC: 18 10*3/uL — ABNORMAL HIGH (ref 4.0–10.5)
WBC: 17.2 10*3/uL — ABNORMAL HIGH (ref 4.0–10.5)
nRBC: 0.1 % (ref 0.0–0.2)
nRBC: 0.2 % (ref 0.0–0.2)

## 2022-09-19 LAB — CBC WITH DIFFERENTIAL/PLATELET
Abs Immature Granulocytes: 0.29 10*3/uL — ABNORMAL HIGH (ref 0.00–0.07)
Basophils Absolute: 0 10*3/uL (ref 0.0–0.1)
Basophils Relative: 0 %
Eosinophils Absolute: 0 10*3/uL (ref 0.0–0.5)
Eosinophils Relative: 0 %
HCT: 26.8 % — ABNORMAL LOW (ref 36.0–46.0)
Hemoglobin: 8.2 g/dL — ABNORMAL LOW (ref 12.0–15.0)
Immature Granulocytes: 2 %
Lymphocytes Relative: 18 %
Lymphs Abs: 2.9 10*3/uL (ref 0.7–4.0)
MCH: 27.4 pg (ref 26.0–34.0)
MCHC: 30.6 g/dL (ref 30.0–36.0)
MCV: 89.6 fL (ref 80.0–100.0)
Monocytes Absolute: 1.5 10*3/uL — ABNORMAL HIGH (ref 0.1–1.0)
Monocytes Relative: 9 %
Neutro Abs: 11.3 10*3/uL — ABNORMAL HIGH (ref 1.7–7.7)
Neutrophils Relative %: 71 %
Platelets: 279 10*3/uL (ref 150–400)
RBC: 2.99 MIL/uL — ABNORMAL LOW (ref 3.87–5.11)
RDW: 15.1 % (ref 11.5–15.5)
WBC: 16 10*3/uL — ABNORMAL HIGH (ref 4.0–10.5)
nRBC: 0.2 % (ref 0.0–0.2)

## 2022-09-19 LAB — IRON AND TIBC
Iron: 26 ug/dL — ABNORMAL LOW (ref 28–170)
Saturation Ratios: 6 % — ABNORMAL LOW (ref 10.4–31.8)
TIBC: 441 ug/dL (ref 250–450)
UIBC: 415 ug/dL

## 2022-09-19 LAB — COMPREHENSIVE METABOLIC PANEL
ALT: 12 U/L (ref 0–44)
AST: 22 U/L (ref 15–41)
Albumin: 3 g/dL — ABNORMAL LOW (ref 3.5–5.0)
Alkaline Phosphatase: 45 U/L (ref 38–126)
Anion gap: 6 (ref 5–15)
BUN: 43 mg/dL — ABNORMAL HIGH (ref 8–23)
CO2: 25 mmol/L (ref 22–32)
Calcium: 8.6 mg/dL — ABNORMAL LOW (ref 8.9–10.3)
Chloride: 108 mmol/L (ref 98–111)
Creatinine, Ser: 1.27 mg/dL — ABNORMAL HIGH (ref 0.44–1.00)
GFR, Estimated: 42 mL/min — ABNORMAL LOW (ref >60)
Glucose, Bld: 102 mg/dL — ABNORMAL HIGH (ref 70–99)
Potassium: 4.7 mmol/L (ref 3.5–5.1)
Sodium: 139 mmol/L (ref 135–145)
Total Bilirubin: 0.8 mg/dL (ref 0.3–1.2)
Total Protein: 5.6 g/dL — ABNORMAL LOW (ref 6.5–8.1)

## 2022-09-19 LAB — PROTIME-INR
INR: 1.1 (ref 0.8–1.2)
Prothrombin Time: 14.3 seconds (ref 11.4–15.2)

## 2022-09-19 LAB — APTT: aPTT: 21 seconds — ABNORMAL LOW (ref 24–36)

## 2022-09-19 LAB — MAGNESIUM: Magnesium: 2.6 mg/dL — ABNORMAL HIGH (ref 1.7–2.4)

## 2022-09-19 MED ORDER — SODIUM CHLORIDE 0.9 % IV SOLN: 1.0000 g | INTRAVENOUS | Status: DC

## 2022-09-19 MED ORDER — GUAIFENESIN-CODEINE 100-10 MG/5ML PO SOLN
5.0000 mL | ORAL | Status: DC | PRN
  Administered 2022-09-19: 5 mL via ORAL
  Filled 2022-09-19 (×2): qty 5

## 2022-09-19 MED ORDER — CIPROFLOXACIN HCL 500 MG PO TABS
500.0000 mg | ORAL_TABLET | Freq: Two times a day (BID) | ORAL | Status: AC
  Administered 2022-09-19 – 2022-09-22 (×6): 500 mg via ORAL
  Filled 2022-09-19 (×5): qty 1

## 2022-09-19 NOTE — Consult Note (Signed)
Referring Provider:  TH Primary Care Physician:  Danella Penton, MD Primary Gastroenterologist: Unassigned/[facility]  Reason for Consultation: GI bleed/melena  HPI: Cheryl Goodman is a 81 y.o. female with past medical history of DVT and PE after hospitalization in October 2022 which was treated with Eliquis for 6 months, subsequently diagnosed with stroke on August 19, 2022, Currently on aspirin and Plavix presented to the hospital with melena. Upon initial blood work, she was found to have anemia with hemoglobin of 9.8, mild elevated creatinine, normal LFTs, normal lipase.  Blood work this morning showed hemoglobin of 8.2.  Normal INR.  CT angio yesterday showed large hiatal hernia otherwise no active GI bleeding.  GI is consulted for further evaluation.  Patient seen and examined at bedside.  Patient has been taking some ibuprofen and Advil as needed such as once a week for her arthritis.  Started seeing black-colored stool around 5 days ago.  No significant abdominal pain.  Denies any nausea or vomiting.  Denies any diarrhea or constipation.  EGD in July 2022 at [facility] showed large hiatal hernia. Colonoscopy in July 2022 showed diverticulosis otherwise no acute changes or polyps.   Past Medical History:  Diagnosis Date   Acute pulmonary embolism with acute cor pulmonale (HCC) 08/10/2019   Arthritis    Dysphagia    GERD (gastroesophageal reflux disease)    History of hiatal hernia    History of kidney stones    x2 ; passed independently   Hyperlipidemia    Hypertension    Nausea    OA (osteoarthritis) of knee 10/31/2017   Sleep apnea     Past Surgical History:  Procedure Laterality Date   ABDOMINAL HYSTERECTOMY     APPENDECTOMY     BREAST BIOPSY Right    BREAST SURGERY     CARPAL TUNNEL RELEASE     CHOLECYSTECTOMY     COLONOSCOPY WITH PROPOFOL N/A 09/01/2015   Procedure: COLONOSCOPY WITH PROPOFOL;  Surgeon: Scot Jun, MD;  Location: [facility]  ENDOSCOPY;  Service: Endoscopy;  Laterality: N/A;   COLONOSCOPY WITH PROPOFOL N/A 04/29/2021   Procedure: COLONOSCOPY WITH PROPOFOL;  Surgeon: Earline Mayotte, MD;  Location: [facility] ENDOSCOPY;  Service: Gastroenterology;  Laterality: N/A;   ESOPHAGOGASTRODUODENOSCOPY N/A 07/17/2019   Procedure: ESOPHAGOGASTRODUODENOSCOPY (EGD);  Surgeon: Pasty Spillers, MD;  Location: [facility] ENDOSCOPY;  Service: Endoscopy;  Laterality: N/A;   ESOPHAGOGASTRODUODENOSCOPY (EGD) WITH PROPOFOL N/A 04/29/2021   Procedure: ESOPHAGOGASTRODUODENOSCOPY (EGD) WITH PROPOFOL;  Surgeon: Earline Mayotte, MD;  Location: [facility] ENDOSCOPY;  Service: Gastroenterology;  Laterality: N/A;   REDUCTION MAMMAPLASTY Bilateral    SAVORY DILATION N/A 09/01/2015   Procedure: SAVORY DILATION;  Surgeon: Scot Jun, MD;  Location: [facility] ENDOSCOPY;  Service: Endoscopy;  Laterality: N/A;   TOTAL KNEE ARTHROPLASTY Right 10/31/2017   Procedure: RIGHT TOTAL KNEE ARTHROPLASTY;  Surgeon: Ollen Gross, MD;  Location: [facility] ORS;  Service: Orthopedics;  Laterality: Right;    Prior to Admission medications   Medication Sig Start Date End Date Taking? Authorizing Provider  acetaminophen (TYLENOL) 325 MG tablet Take 325-650 mg by mouth every 6 (six) hours as needed for headache or mild pain.   Yes [provider]  aspirin EC 81 MG tablet Take 81 mg by mouth daily. Swallow whole.   Yes [provider]  Biotin 1000 MCG tablet Take 1,000 mcg by mouth at bedtime.   Yes [provider]  Cholecalciferol (VITAMIN D3) 125 MCG (5000 UT) TABS Take 5,000 Units by mouth  daily.   Yes [provider]  clopidogrel (PLAVIX) 75 MG tablet Take 75 mg by mouth in the morning.   Yes [provider]  ibuprofen (ADVIL) 800 MG tablet Take 800 mg by mouth every 8 (eight) hours as needed for mild pain or headache.   Yes [provider]  NON FORMULARY Take 1 tablet by mouth See admin instructions. D-arginine tablets- Take  1 tablet by mouth at bedtime   Yes [provider]  pantoprazole (PROTONIX) 40 MG tablet Take 40 mg by mouth daily before breakfast.   Yes [provider]  simvastatin (ZOCOR) 20 MG tablet Take 20 mg by mouth at bedtime.   Yes [provider]  sucralfate (CARAFATE) 1 g tablet Take 1 g by mouth in the morning.   Yes [provider]  traMADol (ULTRAM) 50 MG tablet Take 50 mg by mouth in the morning.   Yes [provider]  triamterene-hydrochlorothiazide (DYAZIDE) 37.5-25 MG capsule Take 1 capsule by mouth daily.   Yes [provider]  omeprazole (PRILOSEC) 40 MG capsule Take 1 capsule (40 mg total) by mouth 2 (two) times daily for 14 days. Patient not taking: Reported on 09/18/2022 08/14/19 09/18/22  Pasty Spillers, MD  polyethylene glycol (MIRALAX / GLYCOLAX) 17 g packet Take 17 g by mouth daily. Patient not taking: Reported on 09/18/2022 08/07/19   Rodolph Bong, MD  torsemide (DEMADEX) 20 MG tablet Take 20 mg by mouth daily.    [provider]    Scheduled Meds:  pantoprazole (PROTONIX) IV  40 mg Intravenous BID   simvastatin  20 mg Oral QHS   Continuous Infusions: PRN Meds:.acetaminophen **OR** acetaminophen, hydrALAZINE, ondansetron **OR** ondansetron (ZOFRAN) IV, mouth rinse, polyethylene glycol  Allergies as of 09/18/2022 - Review Complete 09/18/2022  Allergen Reaction Noted   Penicillins Rash and Other (See Comments) 08/14/2015   Sulfa antibiotics Anaphylaxis and Swelling 08/14/2015   Ace inhibitors Cough 11/05/2015   Dexlansoprazole Other (See Comments) 08/14/2015   Oxycodone Nausea Only 12/19/2017    Family History  Problem Relation Age of Onset   Breast cancer Maternal Grandmother        in 28's   Heart disease Neg Hx     Social History   Socioeconomic History   Marital status: Married    Spouse name: Not on file   Number of children: Not on file   Years of education: Not on file   Highest  education level: Not on file  Occupational History   Not on file  Tobacco Use   Smoking status: Never   Smokeless tobacco: Never  Vaping Use   Vaping Use: Never used  Substance and Sexual Activity   Alcohol use: No   Drug use: No   Sexual activity: Yes    Birth control/protection: None    Comment: intercourse age 21, less than 5 sexual partners  Other Topics Concern   Not on file  Social History Narrative   Not on file   Social Determinants of Health   Financial Resource Strain: Not on file  Food Insecurity: No Food Insecurity (09/18/2022)   Hunger Vital Sign    Worried About Running Out of Food in the Last Year: Never true    Ran Out of Food in the Last Year: Never true  Transportation Needs: No Transportation Needs (09/18/2022)   PRAPARE - Administrator, Civil Service (Medical): No    Lack of Transportation (Non-Medical): No  Physical Activity: Not  on file  Stress: Not on file  Social Connections: Not on file  Intimate Partner Violence: Not At Risk (09/18/2022)   Humiliation, Afraid, Rape, and Kick questionnaire    Fear of Current or Ex-Partner: No    Emotionally Abused: No    Physically Abused: No    Sexually Abused: No    Review of Systems: All negative except as stated above in HPI.  Physical Exam: Vital signs: Vitals:   09/19/22 0355 09/19/22 0855  BP: 120/62 128/63  Pulse: 66 66  Resp: 18 20  Temp: 98.9 F (37.2 C) 98.3 F (36.8 C)  SpO2: 97% 99%   Last BM Date : 09/18/22 General:   Alert,  Well-developed, well-nourished, pleasant and cooperative in NAD Lungs:  Clear throughout to auscultation.   No wheezes, crackles, or rhonchi. No acute distress. Heart:  Regular rate and rhythm; no murmurs, clicks, rubs,  or gallops. Abdomen: Soft, nontender, nondistended, bowel sounds present, no peritoneal signs Neuro -alert and oriented x 3 Psych -mood and affect normal Rectal:  Deferred  GI:  Lab Results: Recent Labs    09/18/22 1413  09/19/22 0029 09/19/22 0715  WBC 20.9* 18.0* 16.0*  HGB 9.8* 8.5* 8.2*  HCT 31.1* 27.3* 26.8*  PLT 379 307 279   BMET Recent Labs    09/18/22 1413 09/19/22 0722  NA 136 139  K 4.1 4.7  CL 105 108  CO2 23 25  GLUCOSE 146* 102*  BUN 64* 43*  CREATININE 1.46* 1.27*  CALCIUM 8.8* 8.6*   LFT Recent Labs    09/19/22 0722  PROT 5.6*  ALBUMIN 3.0*  AST 22  ALT 12  ALKPHOS 45  BILITOT 0.8   PT/INR Recent Labs    09/19/22 0722  LABPROT 14.3  INR 1.1     Studies/Results: CT Angio Abd/Pel W and/or Wo Contrast  Result Date: 09/18/2022 CLINICAL DATA:  Lower GI bleed.  Blood in stool. EXAM: CTA ABDOMEN AND PELVIS WITHOUT AND WITH CONTRAST TECHNIQUE: Multidetector CT imaging of the abdomen and pelvis was performed using the standard protocol during bolus administration of intravenous contrast. Multiplanar reconstructed images and MIPs were obtained and reviewed to evaluate the vascular anatomy. RADIATION DOSE REDUCTION: This exam was performed according to the departmental dose-optimization program which includes automated exposure control, adjustment of the mA and/or kV according to patient size and/or use of iterative reconstruction technique. CONTRAST:  75mL OMNIPAQUE IOHEXOL 350 MG/ML SOLN COMPARISON:  CT abdomen pelvis 02/11/2021 FINDINGS: VASCULAR Aorta: Normal caliber aorta without aneurysm, dissection, vasculitis or significant stenosis. Celiac: Patent without evidence of aneurysm, dissection, vasculitis or significant stenosis. A 1.2 cm peripherally calcified splenic artery aneurysm is noted, stable compared to 04/22/2008 (series 4, image 37). SMA: Patent without evidence of aneurysm, dissection, vasculitis or significant stenosis. Renals: Both renal arteries are patent without evidence of aneurysm, dissection, vasculitis, fibromuscular dysplasia or significant stenosis. IMA: Patent without evidence of aneurysm, dissection, vasculitis or significant stenosis. Inflow: Patent  without evidence of aneurysm, dissection, vasculitis or significant stenosis. Proximal Outflow: Bilateral common femoral and visualized portions of the superficial and profunda femoral arteries are patent without evidence of aneurysm, dissection, vasculitis or significant stenosis. Veins: Patent. Review of the MIP images confirms the above findings. NON-VASCULAR Lower chest: No acute abnormality. Hepatobiliary: No focal liver abnormality is seen. Status post cholecystectomy. No biliary dilatation. Pancreas: Unremarkable. No pancreatic ductal dilatation or surrounding inflammatory changes. Spleen: Normal in size without focal abnormality. Adrenals/Urinary Tract: Adrenal glands are unremarkable. Kidneys are normal, without renal  calculi, focal lesion, or hydronephrosis. Bladder is unremarkable. Stomach/Bowel: Partially visualized large hiatal hernia appears to involve the entire stomach, not included on the field of view. A 3.1 cm fluid and air-filled diverticulum is noted in the posterior aspect of the second portion of the duodenum (series 11, image 26). Diverticulosis coli. No evidence of bowel wall thickening, distention, or inflammatory changes. Lymphatic: Mild aortic bi-iliac atherosclerosis. No enlarged abdominal or pelvic lymph nodes. Reproductive: Status post hysterectomy. No adnexal masses. Other: No abdominal wall hernia or abnormality. No abdominopelvic ascites. Musculoskeletal: No acute or suspicious osseous findings. Chronic compression fracture of the T12 vertebral body. IMPRESSION: VASCULAR 1. No evidence of active gastrointestinal bleeding. 2. Stable 1.2 cm splenic artery aneurysm. 3. Mild aorto bi-iliac atherosclerosis. Aortic Atherosclerosis (ICD10-I70.0). NON-VASCULAR 1. No acute abnormality in the abdomen or pelvis. 2. Large hiatal hernia. 3. Diverticulosis coli.  No CT evidence of diverticulitis. Electronically Signed   By: Sherron Ales M.D.   On: 09/18/2022 16:01    Impression/Plan: -Melena  in a patient with history of large hiatal hernia and intermittent NSAID use.  Could be Cameron's ulcer versus gastritis.  Last EGD in July 2022 showed large hiatal hernia but no other changes. -Acute blood loss anemia. -History of CVA.  Was on aspirin and Plavix.  Last dose yesterday. -History of DVT and PE.  Currently off of anticoagulation.  Recommendations ------------------------- -Continue supportive care for now. -Continue IV twice daily PPI for now. -Okay to have heart healthy diet today. -Monitor H&H.  GI will follow tomorrow.  Continue to hold Plavix for now.    LOS: 0 days   Kathi Der  MD, FACP 09/19/2022, 9:37 AM  Contact #  [phone]

## 2022-09-19 NOTE — Progress Notes (Signed)
PROGRESS NOTE   Cheryl Goodman  BPZ:[id number] DOB: 17-Nov-1940 DOA: 09/18/2022 PCP: Danella Penton, MD   Date of Service: the patient was seen and examined on 09/19/2022  Brief Narrative:  81 year old female with past medical history of gastroesophageal reflux disease, hypertension, hyperlipidemia, obstructive sleep apnea and pertinent history of upper GI bleeding secondary to erosive gastritis requiring hospitalization (treated at [facility] 07/2019) followed by submassive bilateral pulmonary emboli and left lower extremity DVT also requiring hospitalization (also during 07/2019 treated with 6 months of Eliquis successfully).  Finally, patient also has a recent diagnosis of right corona radiata stroke diagnosed 08/19/2022 as an outpatient via MRI brain in [facility] managed as an outpatient with aspirin and Plavix.  Patient now presents to [facility] emergency department with complaints of 4 days of black tarry stool.  Patient complains of some abdominal pain over this same span of time, mostly in the lower left abdomen . Mild / moderate in intensity, non radiating and cramping in quality.  Patient is also complaining of progressively worsening generalized weakness over the same span of time, moderate to severe in intensity, worse with exertion and improved with rest as well as associated lightheadedness.  Patient denies chest pain or shortness of breath.  Patient does occasionally take Ibuprofen and denies alcohol use.    Upon evaluation in the emergency department CT angiogram of the abdomen pelvis was performed revealing no active extravasation to suggest bleeding.  Patient was initiated on intravenous Protonix.  EDP sent a secure chat to [facility] gastroenterology, on-call for unassigned gastroenterology patient's and the hospitalist group has now been called to assess the patient for admission the hospital.     Assessment and Plan: * Acute upper GI bleed No further clinical  evidence of bleeding Hemoglobin is downtrending however, now at 8.5 down from 9.8 on admission. Patient evaluated by gastroenterology recommending supportive care with continued PPI and monitoring hemoglobin and hematocrit Advancing to heart healthy diet.   Holding all outpatient anticoagulation such as aspirin and Plavix Monitoring hemoglobin and hematocrit with serial CBCs Will transfuse for active gross bleeding or hemoglobin of less than 7    AKI (acute kidney injury) (HCC) Improving with intravenous fluids  Urinalysis somewhat abnormal and growing out Klebsiella pneumoniae, will treat with antibiotics Intravenous volume resuscitation. Strict input and output monitoring Monitoring renal function and electrolytes with serial chemistries Avoiding nephrotoxic agents if at all possible   History of ischemic stroke Recent diagnosis of corona radiata stroke 1 month ago in the outpatient setting at [facility] clinic Patient has since been on aspirin and Plavix, will hold these at this time in the setting of suspected upper gastrointestinal bleeding  Essential hypertension Currently normotensive As needed intravenous antihypertensives for markedly elevated blood pressure.  Mixed hyperlipidemia Continuing home regimen of lipid lowering therapy.   Gastroesophageal reflux disease On intravenous proton pump inhibitor as noted above.    Subjective:  Patient states her abdominal pain is improving.  Patient denies having any further episodes of melena or bright red blood in her stool since yesterday.  Physical Exam:  Vitals:   09/19/22 0355 09/19/22 0855 09/19/22 1318 09/19/22 2027  BP: 120/62 128/63 (!) 118/55 (!) 124/58  Pulse: 66 66 80 77  Resp: 18 20 20 18   Temp: 98.9 F (37.2 C) 98.3 F (36.8 C) 98 F (36.7 C) 99 F (37.2 C)  TempSrc: Oral Oral Oral Oral  SpO2: 97% 99% 99% 97%  Weight:      Height:  Constitutional: Awake alert and oriented x3, no associated  distress.   Skin: no rashes, no lesions, good skin turgor noted. Eyes: Pupils are equally reactive to light.  No evidence of scleral icterus or conjunctival pallor.  ENMT: Moist mucous membranes noted.  Posterior pharynx clear of any exudate or lesions.   Respiratory: clear to auscultation bilaterally, no wheezing, no crackles. Normal respiratory effort. No accessory muscle use.  Cardiovascular: Regular rate and rhythm, no murmurs / rubs / gallops. No extremity edema. 2+ pedal pulses. No carotid bruits.  Abdomen: Abdomen is soft and nontender.  No evidence of intra-abdominal masses.  Positive bowel sounds noted in all quadrants.   Musculoskeletal: No joint deformity upper and lower extremities. Good ROM, no contractures. Normal muscle tone.    Data Reviewed:  I have personally reviewed and interpreted labs, imaging.  Significant findings are   CBC: Recent Labs  Lab 09/18/22 1413 09/19/22 0029 09/19/22 0715 09/19/22 1816  WBC 20.9* 18.0* 16.0* 17.2*  NEUTROABS 15.9*  --  11.3*  --   HGB 9.8* 8.5* 8.2* 8.4*  HCT 31.1* 27.3* 26.8* 27.2*  MCV 89.4 88.9 89.6 91.0  PLT 379 307 279 296   Basic Metabolic Panel: Recent Labs  Lab 09/18/22 1413 09/19/22 0722  NA 136 139  K 4.1 4.7  CL 105 108  CO2 23 25  GLUCOSE 146* 102*  BUN 64* 43*  CREATININE 1.46* 1.27*  CALCIUM 8.8* 8.6*  MG  --  2.6*   GFR: Estimated Creatinine Clearance: 32.6 mL/min (A) (by C-G formula based on SCr of 1.27 mg/dL (H)). Liver Function Tests: Recent Labs  Lab 09/18/22 1413 09/19/22 0722  AST 16 22  ALT 17 12  ALKPHOS 55 45  BILITOT 0.5 0.8  PROT 6.6 5.6*  ALBUMIN 3.5 3.0*    Coagulation Profile: Recent Labs  Lab 09/19/22 0722  INR 1.1      Code Status:  Full code.  Code status decision has been confirmed with: patient    Severity of Illness:  The appropriate patient status for this patient is OBSERVATION. Observation status is judged to be reasonable and necessary in order to  provide the required intensity of service to ensure the patient's safety. The patient's presenting symptoms, physical exam findings, and initial radiographic and laboratory data in the context of their medical condition is felt to place them at decreased risk for further clinical deterioration. Furthermore, it is anticipated that the patient will be medically stable for discharge from the hospital within 2 midnights of admission.   Time spent:  40 minutes  Author:  Marinda Elk MD  09/19/2022 8:55 PM

## 2022-09-19 NOTE — H&P (View-Only) (Signed)
Referring Provider:  TH Primary Care Physician:  Danella Penton, MD Primary Gastroenterologist: Unassigned/[facility]  Reason for Consultation: GI bleed/melena  HPI: Cheryl Goodman is a 81 y.o. female with past medical history of DVT and PE after hospitalization in October 2022 which was treated with Eliquis for 6 months, subsequently diagnosed with stroke on August 19, 2022, Currently on aspirin and Plavix presented to the hospital with melena. Upon initial blood work, she was found to have anemia with hemoglobin of 9.8, mild elevated creatinine, normal LFTs, normal lipase.  Blood work this morning showed hemoglobin of 8.2.  Normal INR.  CT angio yesterday showed large hiatal hernia otherwise no active GI bleeding.  GI is consulted for further evaluation.  Patient seen and examined at bedside.  Patient has been taking some ibuprofen and Advil as needed such as once a week for her arthritis.  Started seeing black-colored stool around 5 days ago.  No significant abdominal pain.  Denies any nausea or vomiting.  Denies any diarrhea or constipation.  EGD in July 2022 at [facility] showed large hiatal hernia. Colonoscopy in July 2022 showed diverticulosis otherwise no acute changes or polyps.   Past Medical History:  Diagnosis Date   Acute pulmonary embolism with acute cor pulmonale (HCC) 08/10/2019   Arthritis    Dysphagia    GERD (gastroesophageal reflux disease)    History of hiatal hernia    History of kidney stones    x2 ; passed independently   Hyperlipidemia    Hypertension    Nausea    OA (osteoarthritis) of knee 10/31/2017   Sleep apnea     Past Surgical History:  Procedure Laterality Date   ABDOMINAL HYSTERECTOMY     APPENDECTOMY     BREAST BIOPSY Right    BREAST SURGERY     CARPAL TUNNEL RELEASE     CHOLECYSTECTOMY     COLONOSCOPY WITH PROPOFOL N/A 09/01/2015   Procedure: COLONOSCOPY WITH PROPOFOL;  Surgeon: Scot Jun, MD;  Location: [facility]  ENDOSCOPY;  Service: Endoscopy;  Laterality: N/A;   COLONOSCOPY WITH PROPOFOL N/A 04/29/2021   Procedure: COLONOSCOPY WITH PROPOFOL;  Surgeon: Earline Mayotte, MD;  Location: [facility] ENDOSCOPY;  Service: Gastroenterology;  Laterality: N/A;   ESOPHAGOGASTRODUODENOSCOPY N/A 07/17/2019   Procedure: ESOPHAGOGASTRODUODENOSCOPY (EGD);  Surgeon: Pasty Spillers, MD;  Location: [facility] ENDOSCOPY;  Service: Endoscopy;  Laterality: N/A;   ESOPHAGOGASTRODUODENOSCOPY (EGD) WITH PROPOFOL N/A 04/29/2021   Procedure: ESOPHAGOGASTRODUODENOSCOPY (EGD) WITH PROPOFOL;  Surgeon: Earline Mayotte, MD;  Location: [facility] ENDOSCOPY;  Service: Gastroenterology;  Laterality: N/A;   REDUCTION MAMMAPLASTY Bilateral    SAVORY DILATION N/A 09/01/2015   Procedure: SAVORY DILATION;  Surgeon: Scot Jun, MD;  Location: [facility] ENDOSCOPY;  Service: Endoscopy;  Laterality: N/A;   TOTAL KNEE ARTHROPLASTY Right 10/31/2017   Procedure: RIGHT TOTAL KNEE ARTHROPLASTY;  Surgeon: Ollen Gross, MD;  Location: [facility] ORS;  Service: Orthopedics;  Laterality: Right;    Prior to Admission medications   Medication Sig Start Date End Date Taking? Authorizing Provider  acetaminophen (TYLENOL) 325 MG tablet Take 325-650 mg by mouth every 6 (six) hours as needed for headache or mild pain.   Yes [provider]  aspirin EC 81 MG tablet Take 81 mg by mouth daily. Swallow whole.   Yes [provider]  Biotin 1000 MCG tablet Take 1,000 mcg by mouth at bedtime.   Yes [provider]  Cholecalciferol (VITAMIN D3) 125 MCG (5000 UT) TABS Take 5,000 Units by mouth  daily.   Yes [provider]  clopidogrel (PLAVIX) 75 MG tablet Take 75 mg by mouth in the morning.   Yes [provider]  ibuprofen (ADVIL) 800 MG tablet Take 800 mg by mouth every 8 (eight) hours as needed for mild pain or headache.   Yes [provider]  NON FORMULARY Take 1 tablet by mouth See admin instructions. D-arginine tablets- Take  1 tablet by mouth at bedtime   Yes [provider]  pantoprazole (PROTONIX) 40 MG tablet Take 40 mg by mouth daily before breakfast.   Yes [provider]  simvastatin (ZOCOR) 20 MG tablet Take 20 mg by mouth at bedtime.   Yes [provider]  sucralfate (CARAFATE) 1 g tablet Take 1 g by mouth in the morning.   Yes [provider]  traMADol (ULTRAM) 50 MG tablet Take 50 mg by mouth in the morning.   Yes [provider]  triamterene-hydrochlorothiazide (DYAZIDE) 37.5-25 MG capsule Take 1 capsule by mouth daily.   Yes [provider]  omeprazole (PRILOSEC) 40 MG capsule Take 1 capsule (40 mg total) by mouth 2 (two) times daily for 14 days. Patient not taking: Reported on 09/18/2022 08/14/19 09/18/22  Pasty Spillers, MD  polyethylene glycol (MIRALAX / GLYCOLAX) 17 g packet Take 17 g by mouth daily. Patient not taking: Reported on 09/18/2022 08/07/19   Rodolph Bong, MD  torsemide (DEMADEX) 20 MG tablet Take 20 mg by mouth daily.    [provider]    Scheduled Meds:  pantoprazole (PROTONIX) IV  40 mg Intravenous BID   simvastatin  20 mg Oral QHS   Continuous Infusions: PRN Meds:.acetaminophen **OR** acetaminophen, hydrALAZINE, ondansetron **OR** ondansetron (ZOFRAN) IV, mouth rinse, polyethylene glycol  Allergies as of 09/18/2022 - Review Complete 09/18/2022  Allergen Reaction Noted   Penicillins Rash and Other (See Comments) 08/14/2015   Sulfa antibiotics Anaphylaxis and Swelling 08/14/2015   Ace inhibitors Cough 11/05/2015   Dexlansoprazole Other (See Comments) 08/14/2015   Oxycodone Nausea Only 12/19/2017    Family History  Problem Relation Age of Onset   Breast cancer Maternal Grandmother        in 28's   Heart disease Neg Hx     Social History   Socioeconomic History   Marital status: Married    Spouse name: Not on file   Number of children: Not on file   Years of education: Not on file   Highest  education level: Not on file  Occupational History   Not on file  Tobacco Use   Smoking status: Never   Smokeless tobacco: Never  Vaping Use   Vaping Use: Never used  Substance and Sexual Activity   Alcohol use: No   Drug use: No   Sexual activity: Yes    Birth control/protection: None    Comment: intercourse age 21, less than 5 sexual partners  Other Topics Concern   Not on file  Social History Narrative   Not on file   Social Determinants of Health   Financial Resource Strain: Not on file  Food Insecurity: No Food Insecurity (09/18/2022)   Hunger Vital Sign    Worried About Running Out of Food in the Last Year: Never true    Ran Out of Food in the Last Year: Never true  Transportation Needs: No Transportation Needs (09/18/2022)   PRAPARE - Administrator, Civil Service (Medical): No    Lack of Transportation (Non-Medical): No  Physical Activity: Not  on file  Stress: Not on file  Social Connections: Not on file  Intimate Partner Violence: Not At Risk (09/18/2022)   Humiliation, Afraid, Rape, and Kick questionnaire    Fear of Current or Ex-Partner: No    Emotionally Abused: No    Physically Abused: No    Sexually Abused: No    Review of Systems: All negative except as stated above in HPI.  Physical Exam: Vital signs: Vitals:   09/19/22 0355 09/19/22 0855  BP: 120/62 128/63  Pulse: 66 66  Resp: 18 20  Temp: 98.9 F (37.2 C) 98.3 F (36.8 C)  SpO2: 97% 99%   Last BM Date : 09/18/22 General:   Alert,  Well-developed, well-nourished, pleasant and cooperative in NAD Lungs:  Clear throughout to auscultation.   No wheezes, crackles, or rhonchi. No acute distress. Heart:  Regular rate and rhythm; no murmurs, clicks, rubs,  or gallops. Abdomen: Soft, nontender, nondistended, bowel sounds present, no peritoneal signs Neuro -alert and oriented x 3 Psych -mood and affect normal Rectal:  Deferred  GI:  Lab Results: Recent Labs    09/18/22 1413  09/19/22 0029 09/19/22 0715  WBC 20.9* 18.0* 16.0*  HGB 9.8* 8.5* 8.2*  HCT 31.1* 27.3* 26.8*  PLT 379 307 279   BMET Recent Labs    09/18/22 1413 09/19/22 0722  NA 136 139  K 4.1 4.7  CL 105 108  CO2 23 25  GLUCOSE 146* 102*  BUN 64* 43*  CREATININE 1.46* 1.27*  CALCIUM 8.8* 8.6*   LFT Recent Labs    09/19/22 0722  PROT 5.6*  ALBUMIN 3.0*  AST 22  ALT 12  ALKPHOS 45  BILITOT 0.8   PT/INR Recent Labs    09/19/22 0722  LABPROT 14.3  INR 1.1     Studies/Results: CT Angio Abd/Pel W and/or Wo Contrast  Result Date: 09/18/2022 CLINICAL DATA:  Lower GI bleed.  Blood in stool. EXAM: CTA ABDOMEN AND PELVIS WITHOUT AND WITH CONTRAST TECHNIQUE: Multidetector CT imaging of the abdomen and pelvis was performed using the standard protocol during bolus administration of intravenous contrast. Multiplanar reconstructed images and MIPs were obtained and reviewed to evaluate the vascular anatomy. RADIATION DOSE REDUCTION: This exam was performed according to the departmental dose-optimization program which includes automated exposure control, adjustment of the mA and/or kV according to patient size and/or use of iterative reconstruction technique. CONTRAST:  75mL OMNIPAQUE IOHEXOL 350 MG/ML SOLN COMPARISON:  CT abdomen pelvis 02/11/2021 FINDINGS: VASCULAR Aorta: Normal caliber aorta without aneurysm, dissection, vasculitis or significant stenosis. Celiac: Patent without evidence of aneurysm, dissection, vasculitis or significant stenosis. A 1.2 cm peripherally calcified splenic artery aneurysm is noted, stable compared to 04/22/2008 (series 4, image 37). SMA: Patent without evidence of aneurysm, dissection, vasculitis or significant stenosis. Renals: Both renal arteries are patent without evidence of aneurysm, dissection, vasculitis, fibromuscular dysplasia or significant stenosis. IMA: Patent without evidence of aneurysm, dissection, vasculitis or significant stenosis. Inflow: Patent  without evidence of aneurysm, dissection, vasculitis or significant stenosis. Proximal Outflow: Bilateral common femoral and visualized portions of the superficial and profunda femoral arteries are patent without evidence of aneurysm, dissection, vasculitis or significant stenosis. Veins: Patent. Review of the MIP images confirms the above findings. NON-VASCULAR Lower chest: No acute abnormality. Hepatobiliary: No focal liver abnormality is seen. Status post cholecystectomy. No biliary dilatation. Pancreas: Unremarkable. No pancreatic ductal dilatation or surrounding inflammatory changes. Spleen: Normal in size without focal abnormality. Adrenals/Urinary Tract: Adrenal glands are unremarkable. Kidneys are normal, without renal   calculi, focal lesion, or hydronephrosis. Bladder is unremarkable. Stomach/Bowel: Partially visualized large hiatal hernia appears to involve the entire stomach, not included on the field of view. A 3.1 cm fluid and air-filled diverticulum is noted in the posterior aspect of the second portion of the duodenum (series 11, image 26). Diverticulosis coli. No evidence of bowel wall thickening, distention, or inflammatory changes. Lymphatic: Mild aortic bi-iliac atherosclerosis. No enlarged abdominal or pelvic lymph nodes. Reproductive: Status post hysterectomy. No adnexal masses. Other: No abdominal wall hernia or abnormality. No abdominopelvic ascites. Musculoskeletal: No acute or suspicious osseous findings. Chronic compression fracture of the T12 vertebral body. IMPRESSION: VASCULAR 1. No evidence of active gastrointestinal bleeding. 2. Stable 1.2 cm splenic artery aneurysm. 3. Mild aorto bi-iliac atherosclerosis. Aortic Atherosclerosis (ICD10-I70.0). NON-VASCULAR 1. No acute abnormality in the abdomen or pelvis. 2. Large hiatal hernia. 3. Diverticulosis coli.  No CT evidence of diverticulitis. Electronically Signed   By: Sherron Ales M.D.   On: 09/18/2022 16:01    Impression/Plan: -Melena  in a patient with history of large hiatal hernia and intermittent NSAID use.  Could be Cameron's ulcer versus gastritis.  Last EGD in July 2022 showed large hiatal hernia but no other changes. -Acute blood loss anemia. -History of CVA.  Was on aspirin and Plavix.  Last dose yesterday. -History of DVT and PE.  Currently off of anticoagulation.  Recommendations ------------------------- -Continue supportive care for now. -Continue IV twice daily PPI for now. -Okay to have heart healthy diet today. -Monitor H&H.  GI will follow tomorrow.  Continue to hold Plavix for now.    LOS: 0 days   Kathi Der  MD, FACP 09/19/2022, 9:37 AM  Contact #  [phone]

## 2022-09-20 ENCOUNTER — Observation Stay (HOSPITAL_COMMUNITY): Payer: Medicare HMO

## 2022-09-20 ENCOUNTER — Other Ambulatory Visit (HOSPITAL_COMMUNITY): Payer: Medicare HMO

## 2022-09-20 DIAGNOSIS — R52 Pain, unspecified: Secondary | ICD-10-CM | POA: Diagnosis not present

## 2022-09-20 DIAGNOSIS — K5790 Diverticulosis of intestine, part unspecified, without perforation or abscess without bleeding: Secondary | ICD-10-CM | POA: Diagnosis present

## 2022-09-20 DIAGNOSIS — E869 Volume depletion, unspecified: Secondary | ICD-10-CM | POA: Diagnosis present

## 2022-09-20 DIAGNOSIS — K449 Diaphragmatic hernia without obstruction or gangrene: Secondary | ICD-10-CM | POA: Diagnosis present

## 2022-09-20 DIAGNOSIS — Z7982 Long term (current) use of aspirin: Secondary | ICD-10-CM | POA: Diagnosis not present

## 2022-09-20 DIAGNOSIS — Z885 Allergy status to narcotic agent status: Secondary | ICD-10-CM | POA: Diagnosis not present

## 2022-09-20 DIAGNOSIS — Z880 Allergy status to penicillin: Secondary | ICD-10-CM | POA: Diagnosis not present

## 2022-09-20 DIAGNOSIS — K2970 Gastritis, unspecified, without bleeding: Secondary | ICD-10-CM | POA: Diagnosis present

## 2022-09-20 DIAGNOSIS — B9689 Other specified bacterial agents as the cause of diseases classified elsewhere: Secondary | ICD-10-CM

## 2022-09-20 DIAGNOSIS — Z8673 Personal history of transient ischemic attack (TIA), and cerebral infarction without residual deficits: Secondary | ICD-10-CM | POA: Diagnosis not present

## 2022-09-20 DIAGNOSIS — Z86711 Personal history of pulmonary embolism: Secondary | ICD-10-CM | POA: Diagnosis not present

## 2022-09-20 DIAGNOSIS — K2971 Gastritis, unspecified, with bleeding: Secondary | ICD-10-CM | POA: Diagnosis not present

## 2022-09-20 DIAGNOSIS — N179 Acute kidney failure, unspecified: Secondary | ICD-10-CM | POA: Diagnosis present

## 2022-09-20 DIAGNOSIS — K2210 Ulcer of esophagus without bleeding: Secondary | ICD-10-CM | POA: Diagnosis not present

## 2022-09-20 DIAGNOSIS — G4730 Sleep apnea, unspecified: Secondary | ICD-10-CM | POA: Diagnosis not present

## 2022-09-20 DIAGNOSIS — K254 Chronic or unspecified gastric ulcer with hemorrhage: Secondary | ICD-10-CM | POA: Diagnosis present

## 2022-09-20 DIAGNOSIS — D500 Iron deficiency anemia secondary to blood loss (chronic): Secondary | ICD-10-CM | POA: Diagnosis not present

## 2022-09-20 DIAGNOSIS — I6523 Occlusion and stenosis of bilateral carotid arteries: Secondary | ICD-10-CM | POA: Diagnosis not present

## 2022-09-20 DIAGNOSIS — I119 Hypertensive heart disease without heart failure: Secondary | ICD-10-CM | POA: Diagnosis present

## 2022-09-20 DIAGNOSIS — D62 Acute posthemorrhagic anemia: Secondary | ICD-10-CM | POA: Diagnosis present

## 2022-09-20 DIAGNOSIS — B961 Klebsiella pneumoniae [K. pneumoniae] as the cause of diseases classified elsewhere: Secondary | ICD-10-CM | POA: Diagnosis present

## 2022-09-20 DIAGNOSIS — N19 Unspecified kidney failure: Secondary | ICD-10-CM | POA: Diagnosis not present

## 2022-09-20 DIAGNOSIS — K2211 Ulcer of esophagus with bleeding: Secondary | ICD-10-CM | POA: Diagnosis not present

## 2022-09-20 DIAGNOSIS — E782 Mixed hyperlipidemia: Secondary | ICD-10-CM | POA: Diagnosis present

## 2022-09-20 DIAGNOSIS — N390 Urinary tract infection, site not specified: Secondary | ICD-10-CM | POA: Diagnosis present

## 2022-09-20 DIAGNOSIS — I6782 Cerebral ischemia: Secondary | ICD-10-CM | POA: Diagnosis not present

## 2022-09-20 DIAGNOSIS — Z1152 Encounter for screening for COVID-19: Secondary | ICD-10-CM | POA: Diagnosis not present

## 2022-09-20 DIAGNOSIS — G4733 Obstructive sleep apnea (adult) (pediatric): Secondary | ICD-10-CM | POA: Diagnosis present

## 2022-09-20 DIAGNOSIS — Z8711 Personal history of peptic ulcer disease: Secondary | ICD-10-CM | POA: Diagnosis not present

## 2022-09-20 DIAGNOSIS — I6622 Occlusion and stenosis of left posterior cerebral artery: Secondary | ICD-10-CM | POA: Diagnosis not present

## 2022-09-20 DIAGNOSIS — I2699 Other pulmonary embolism without acute cor pulmonale: Secondary | ICD-10-CM | POA: Diagnosis not present

## 2022-09-20 DIAGNOSIS — Z888 Allergy status to other drugs, medicaments and biological substances status: Secondary | ICD-10-CM | POA: Diagnosis not present

## 2022-09-20 DIAGNOSIS — D649 Anemia, unspecified: Secondary | ICD-10-CM | POA: Diagnosis not present

## 2022-09-20 DIAGNOSIS — Z882 Allergy status to sulfonamides status: Secondary | ICD-10-CM | POA: Diagnosis not present

## 2022-09-20 DIAGNOSIS — I10 Essential (primary) hypertension: Secondary | ICD-10-CM | POA: Diagnosis not present

## 2022-09-20 DIAGNOSIS — K922 Gastrointestinal hemorrhage, unspecified: Secondary | ICD-10-CM | POA: Diagnosis not present

## 2022-09-20 DIAGNOSIS — K921 Melena: Secondary | ICD-10-CM | POA: Diagnosis present

## 2022-09-20 DIAGNOSIS — K219 Gastro-esophageal reflux disease without esophagitis: Secondary | ICD-10-CM | POA: Diagnosis present

## 2022-09-20 DIAGNOSIS — Z79899 Other long term (current) drug therapy: Secondary | ICD-10-CM | POA: Diagnosis not present

## 2022-09-20 LAB — CBC
HCT: 26.8 % — ABNORMAL LOW (ref 36.0–46.0)
Hemoglobin: 8.3 g/dL — ABNORMAL LOW (ref 12.0–15.0)
MCH: 28.1 pg (ref 26.0–34.0)
MCHC: 31 g/dL (ref 30.0–36.0)
MCV: 90.8 fL (ref 80.0–100.0)
Platelets: 296 10*3/uL (ref 150–400)
RBC: 2.95 MIL/uL — ABNORMAL LOW (ref 3.87–5.11)
RDW: 14.8 % (ref 11.5–15.5)
WBC: 15.2 10*3/uL — ABNORMAL HIGH (ref 4.0–10.5)
nRBC: 0.1 % (ref 0.0–0.2)

## 2022-09-20 LAB — COMPREHENSIVE METABOLIC PANEL
ALT: 14 U/L (ref 0–44)
AST: 14 U/L — ABNORMAL LOW (ref 15–41)
Albumin: 3.1 g/dL — ABNORMAL LOW (ref 3.5–5.0)
Alkaline Phosphatase: 49 U/L (ref 38–126)
Anion gap: 6 (ref 5–15)
BUN: 33 mg/dL — ABNORMAL HIGH (ref 8–23)
CO2: 24 mmol/L (ref 22–32)
Calcium: 8.6 mg/dL — ABNORMAL LOW (ref 8.9–10.3)
Chloride: 110 mmol/L (ref 98–111)
Creatinine, Ser: 1.31 mg/dL — ABNORMAL HIGH (ref 0.44–1.00)
GFR, Estimated: 41 mL/min — ABNORMAL LOW (ref >60)
Glucose, Bld: 104 mg/dL — ABNORMAL HIGH (ref 70–99)
Potassium: 4 mmol/L (ref 3.5–5.1)
Sodium: 140 mmol/L (ref 135–145)
Total Bilirubin: 0.7 mg/dL (ref 0.3–1.2)
Total Protein: 5.7 g/dL — ABNORMAL LOW (ref 6.5–8.1)

## 2022-09-20 LAB — URINE CULTURE: Culture: >=100000 — AB

## 2022-09-20 LAB — SODIUM, URINE, RANDOM: Sodium, Ur: 79 mmol/L

## 2022-09-20 LAB — CREATININE, URINE, RANDOM: Creatinine, Urine: 95 mg/dL

## 2022-09-20 LAB — MAGNESIUM: Magnesium: 2.4 mg/dL (ref 1.7–2.4)

## 2022-09-20 MED ORDER — MELATONIN 5 MG PO TABS
5.0000 mg | ORAL_TABLET | Freq: Once | ORAL | Status: AC
  Administered 2022-09-20: 5 mg via ORAL
  Filled 2022-09-20: qty 1

## 2022-09-20 MED ORDER — MELATONIN 3 MG PO TABS
3.0000 mg | ORAL_TABLET | Freq: Once | ORAL | Status: AC
  Administered 2022-09-20: 3 mg via ORAL
  Filled 2022-09-20: qty 1

## 2022-09-20 MED ORDER — TRAMADOL HCL 50 MG PO TABS
50.0000 mg | ORAL_TABLET | Freq: Two times a day (BID) | ORAL | Status: DC | PRN
  Administered 2022-09-26: 50 mg via ORAL
  Filled 2022-09-20: qty 1

## 2022-09-20 MED ORDER — SODIUM CHLORIDE 0.9 % IV SOLN
250.0000 mg | Freq: Every day | INTRAVENOUS | Status: AC
  Administered 2022-09-20 – 2022-09-21 (×2): 250 mg via INTRAVENOUS
  Filled 2022-09-20 (×2): qty 20

## 2022-09-20 MED ORDER — LACTATED RINGERS IV SOLN: INTRAVENOUS | Status: AC

## 2022-09-20 NOTE — Progress Notes (Signed)
PROGRESS NOTE   Cheryl Goodman  QQV:[id number] DOB: January 15, 1941 DOA: 09/18/2022 PCP: Danella Penton, MD   Date of Service: the patient was seen and examined on 09/20/2022  Brief Narrative:  81 year old female with past medical history of gastroesophageal reflux disease, hypertension, hyperlipidemia, obstructive sleep apnea and pertinent history of upper GI bleeding secondary to erosive gastritis requiring hospitalization (treated at [facility] 07/2019) followed by submassive bilateral pulmonary emboli and left lower extremity DVT also requiring hospitalization (also during 07/2019 treated with 6 months of Eliquis successfully).  Finally, patient also has a recent diagnosis of right corona radiata stroke diagnosed 08/19/2022 as an outpatient via MRI brain in [facility] managed as an outpatient with aspirin and Plavix.  Patient now presents to [facility] emergency department with complaints of 4 days of black tarry stool.  Patient complains of some abdominal pain over this same span of time, mostly in the lower left abdomen . Mild / moderate in intensity, non radiating and cramping in quality.  Patient is also complaining of progressively worsening generalized weakness over the same span of time, moderate to severe in intensity, worse with exertion and improved with rest as well as associated lightheadedness.  Patient denies chest pain or shortness of breath.  Patient does occasionally take Ibuprofen and denies alcohol use.    Upon evaluation in the emergency department CT angiogram of the abdomen pelvis was performed revealing no active extravasation to suggest bleeding.  Patient was initiated on intravenous Protonix.  EDP sent a secure chat to [facility] gastroenterology and the hospitalist group has now been called to assess the patient for admission the hospital.    Assessment and Plan: * Acute upper GI bleed Patient reports continued bouts of melena Hemoglobin is seemingly stabilized,  continue to be 8.3 compared to 8.2 yesterday morning  Gastroenterology now planning to pursue EGD tomorrow N.p.o. after midnight Continue to hemoglobin and hematocrit with serial CBCs.  Holding all outpatient anticoagulation such as aspirin and Plavix Will transfuse for active gross bleeding or hemoglobin of less than 7 Continuing Protonix every 12 hours.    AKI (acute kidney injury) (HCC) Creatinine seems to have plateaued Will expand workup by obtaining renal ultrasound and urine electrolytes Continuing gentle intravenous hydration Improving with intravenous fluids  Continuing to eat Klebsiella pneumoniae UTI (pan sensitive) with ciprofloxacin Strict input and output monitoring Monitoring renal function and electrolytes with serial chemistries Avoiding nephrotoxic agents if at all possible   UTI due to Klebsiella species Abnormal urinalysis on initial workup with urine culture growing out Klebsiella pneumoniae, pansensitive Considering acute kidney injury and abdominal pain on presentation patient is currently being treated with a course of ciprofloxacin  Essential hypertension Currently normotensive As needed intravenous antihypertensives for markedly elevated blood pressure.  History of ischemic stroke Recent diagnosis of corona radiata stroke 1 month ago in the outpatient setting at [facility] Patient has since been on aspirin and Plavix, will hold these at this time in the setting of suspected upper gastrointestinal bleeding  Mixed hyperlipidemia Continuing home regimen of lipid lowering therapy.   Gastroesophageal reflux disease On intravenous proton pump inhibitor as noted above.    Subjective:  Abdominal pain has resolved.  Patient reports several episodes of melena since yesterday.    Physical Exam:  Vitals:   09/19/22 1318 09/19/22 2027 09/20/22 0515 09/20/22 1348  BP: (!) 118/55 (!) 124/58 135/66 (!) 114/59  Pulse: 80 77 67 77  Resp: 20 18  18    Temp: 98 F (  36.7 C) 99 F (37.2 C) 98.8 F (37.1 C) 97.9 F (36.6 C)  TempSrc: Oral Oral Oral Oral  SpO2: 99% 97% 97% 98%  Weight:      Height:        Constitutional: Awake alert and oriented x3, no associated distress.   Skin: no rashes, no lesions, good skin turgor noted. Eyes: Pupils are equally reactive to light.  No evidence of scleral icterus or conjunctival pallor.  ENMT: Moist mucous membranes noted.  Posterior pharynx clear of any exudate or lesions.   Respiratory: clear to auscultation bilaterally, no wheezing, no crackles. Normal respiratory effort. No accessory muscle use.  Cardiovascular: Regular rate and rhythm, no murmurs / rubs / gallops. No extremity edema. 2+ pedal pulses. No carotid bruits.  Abdomen: Abdomen is soft and nontender.  No evidence of intra-abdominal masses.  Positive bowel sounds noted in all quadrants.   Musculoskeletal: No joint deformity upper and lower extremities. Good ROM, no contractures. Normal muscle tone.    Data Reviewed:  I have personally reviewed and interpreted labs, imaging.  Significant findings are   CBC: Recent Labs  Lab 09/18/22 1413 09/19/22 0029 09/19/22 0715 09/19/22 1816 09/20/22 1305  WBC 20.9* 18.0* 16.0* 17.2* 15.2*  NEUTROABS 15.9*  --  11.3*  --   --   HGB 9.8* 8.5* 8.2* 8.4* 8.3*  HCT 31.1* 27.3* 26.8* 27.2* 26.8*  MCV 89.4 88.9 89.6 91.0 90.8  PLT 379 307 279 296 0000000   Basic Metabolic Panel: Recent Labs  Lab 09/18/22 1413 09/19/22 0722 09/20/22 0749  NA 136 139 140  K 4.1 4.7 4.0  CL 105 108 110  CO2 23 25 24   GLUCOSE 146* 102* 104*  BUN 64* 43* 33*  CREATININE 1.46* 1.27* 1.31*  CALCIUM 8.8* 8.6* 8.6*  MG  --  2.6* 2.4   GFR: Estimated Creatinine Clearance: 31.6 mL/min (A) (by C-G formula based on SCr of 1.31 mg/dL (H)). Liver Function Tests: Recent Labs  Lab 09/18/22 1413 09/19/22 0722 09/20/22 0749  AST 16 22 14*  ALT 17 12 14   ALKPHOS 55 45 49  BILITOT 0.5 0.8 0.7  PROT 6.6 5.6*  5.7*  ALBUMIN 3.5 3.0* 3.1*    Coagulation Profile: Recent Labs  Lab 09/19/22 0722  INR 1.1      Code Status:  Full code.  Code status decision has been confirmed with: patient    Severity of Illness:  The appropriate patient status for this patient is OBSERVATION. Observation status is judged to be reasonable and necessary in order to provide the required intensity of service to ensure the patient's safety. The patient's presenting symptoms, physical exam findings, and initial radiographic and laboratory data in the context of their medical condition is felt to place them at decreased risk for further clinical deterioration. Furthermore, it is anticipated that the patient will be medically stable for discharge from the hospital within 2 midnights of admission.   Time spent:  51 minutes  Author:  Vernelle Emerald MD  09/20/2022 8:43 PM

## 2022-09-20 NOTE — Progress Notes (Signed)
Subjective: One black stool yesterday. No abdominal pain.  Objective: Vital signs in last 24 hours: Temp:  [97.9 F (36.6 C)-99 F (37.2 C)] 97.9 F (36.6 C) (12/18 1348) Pulse Rate:  [67-77] 77 (12/18 1348) Resp:  [18] 18 (12/18 1348) BP: (114-135)/(58-66) 114/59 (12/18 1348) SpO2:  [97 %-98 %] 98 % (12/18 1348) Weight change:  Last BM Date : 09/19/22  PE: GEN:  NAD ABD:  Soft, non-tender  Lab Results: CBC    Component Value Date/Time   WBC 15.2 (H) 09/20/2022 1305   RBC 2.95 (L) 09/20/2022 1305   HGB 8.3 (L) 09/20/2022 1305   HGB 13.2 02/17/2013 1708   HCT 26.8 (L) 09/20/2022 1305   HCT 39.4 02/17/2013 1708   PLT 296 09/20/2022 1305   PLT 211 02/17/2013 1708   MCV 90.8 09/20/2022 1305   MCV 87 02/17/2013 1708   MCH 28.1 09/20/2022 1305   MCHC 31.0 09/20/2022 1305   RDW 14.8 09/20/2022 1305   RDW 14.0 02/17/2013 1708   LYMPHSABS 2.9 09/19/2022 0715   MONOABS 1.5 (H) 09/19/2022 0715   EOSABS 0.0 09/19/2022 0715   BASOSABS 0.0 09/19/2022 0715  CMP     Component Value Date/Time   NA 140 09/20/2022 0749   NA 142 02/17/2013 1708   K 4.0 09/20/2022 0749   K 3.7 02/17/2013 1708   CL 110 09/20/2022 0749   CL 106 02/17/2013 1708   CO2 24 09/20/2022 0749   CO2 27 02/17/2013 1708   GLUCOSE 104 (H) 09/20/2022 0749   GLUCOSE 164 (H) 02/17/2013 1708   BUN 33 (H) 09/20/2022 0749   BUN 12 02/17/2013 1708   CREATININE 1.31 (H) 09/20/2022 0749   CREATININE 0.78 02/17/2013 1708   CALCIUM 8.6 (L) 09/20/2022 0749   CALCIUM 8.7 02/17/2013 1708   PROT 5.7 (L) 09/20/2022 0749   PROT 7.1 02/17/2013 1708   ALBUMIN 3.1 (L) 09/20/2022 0749   ALBUMIN 3.6 02/17/2013 1708   AST 14 (L) 09/20/2022 0749   AST 27 02/17/2013 1708   ALT 14 09/20/2022 0749   ALT 23 02/17/2013 1708   ALKPHOS 49 09/20/2022 0749   ALKPHOS 78 02/17/2013 1708   BILITOT 0.7 09/20/2022 0749   BILITOT 0.7 02/17/2013 1708   GFRNONAA 41 (L) 09/20/2022 0749   GFRNONAA >60 02/17/2013 1708   GFRAA >60  08/06/2019 0521   GFRAA >60 02/17/2013 1708    Assessment:   Melena, anemia.  History of endoscopy and colonoscopy for similar reasons [place] in July 2022.   Large hiatal hernia. Chronic anticoagulation, aspirin and clopidigrel, last dose 2 days ago. Intermittent use NSAIDs. History of stroke, DVT/PE.  Plan:   Anticoagulation on hold. PPI. Soft diet today. Endoscopy tomorrow for further evaluation. Risks (bleeding, infection, bowel perforation that could require surgery, sedation-related changes in cardiopulmonary systems), benefits (identification and possible treatment of source of symptoms, exclusion of certain causes of symptoms), and alternatives (watchful waiting, radiographic imaging studies, empiric medical treatment) of upper endoscopy (EGD) were explained to patient/family in detail and patient wishes to proceed.  [facility] will follow.   Cheryl Goodman 09/20/2022, 2:55 PM   Cell [phone] If no answer or after 5 PM call [phone]

## 2022-09-20 NOTE — Care Management Obs Status (Signed)
MEDICARE OBSERVATION STATUS NOTIFICATION   Patient Details  Name: Cheryl Goodman MRN: [id number] Date of Birth: 1941-03-08   Medicare Observation Status Notification Given:  Yes    Beckie Busing, RN 09/20/2022, 10:11 AM

## 2022-09-20 NOTE — Assessment & Plan Note (Addendum)
Abnormal urinalysis on initial workup with urine culture growing out Klebsiella pneumoniae, pansensitive Considering acute kidney injury and abdominal pain on presentation patient is currently being treated with a course of ciprofloxacin

## 2022-09-21 ENCOUNTER — Encounter (HOSPITAL_COMMUNITY)
Admission: EM | Disposition: A | Discharge status: Home / Self Care | Payer: Self-pay | Source: Home / Self Care | Attending: Internal Medicine

## 2022-09-21 ENCOUNTER — Inpatient Hospital Stay (HOSPITAL_COMMUNITY): Payer: Medicare HMO

## 2022-09-21 ENCOUNTER — Inpatient Hospital Stay (HOSPITAL_COMMUNITY): Payer: Medicare HMO | Admitting: Anesthesiology

## 2022-09-21 ENCOUNTER — Encounter (HOSPITAL_COMMUNITY): Payer: Self-pay | Admitting: Internal Medicine

## 2022-09-21 DIAGNOSIS — K449 Diaphragmatic hernia without obstruction or gangrene: Secondary | ICD-10-CM | POA: Diagnosis not present

## 2022-09-21 DIAGNOSIS — K2971 Gastritis, unspecified, with bleeding: Secondary | ICD-10-CM | POA: Diagnosis not present

## 2022-09-21 DIAGNOSIS — K2211 Ulcer of esophagus with bleeding: Secondary | ICD-10-CM | POA: Diagnosis not present

## 2022-09-21 DIAGNOSIS — D62 Acute posthemorrhagic anemia: Secondary | ICD-10-CM

## 2022-09-21 DIAGNOSIS — Z8673 Personal history of transient ischemic attack (TIA), and cerebral infarction without residual deficits: Secondary | ICD-10-CM | POA: Diagnosis not present

## 2022-09-21 DIAGNOSIS — I10 Essential (primary) hypertension: Secondary | ICD-10-CM

## 2022-09-21 DIAGNOSIS — E782 Mixed hyperlipidemia: Secondary | ICD-10-CM | POA: Diagnosis not present

## 2022-09-21 DIAGNOSIS — K922 Gastrointestinal hemorrhage, unspecified: Secondary | ICD-10-CM | POA: Diagnosis not present

## 2022-09-21 DIAGNOSIS — K219 Gastro-esophageal reflux disease without esophagitis: Secondary | ICD-10-CM | POA: Diagnosis not present

## 2022-09-21 HISTORY — PX: ESOPHAGOGASTRODUODENOSCOPY (EGD) WITH PROPOFOL: SHX5813

## 2022-09-21 LAB — CBC WITH DIFFERENTIAL/PLATELET
Abs Immature Granulocytes: 0.18 10*3/uL — ABNORMAL HIGH (ref 0.00–0.07)
Basophils Absolute: 0 10*3/uL (ref 0.0–0.1)
Basophils Relative: 0 %
Eosinophils Absolute: 0.3 10*3/uL (ref 0.0–0.5)
Eosinophils Relative: 2 %
HCT: 26.4 % — ABNORMAL LOW (ref 36.0–46.0)
Hemoglobin: 8 g/dL — ABNORMAL LOW (ref 12.0–15.0)
Immature Granulocytes: 1 %
Lymphocytes Relative: 16 %
Lymphs Abs: 2.7 10*3/uL (ref 0.7–4.0)
MCH: 27.6 pg (ref 26.0–34.0)
MCHC: 30.3 g/dL (ref 30.0–36.0)
MCV: 91 fL (ref 80.0–100.0)
Monocytes Absolute: 1.2 10*3/uL — ABNORMAL HIGH (ref 0.1–1.0)
Monocytes Relative: 7 %
Neutro Abs: 12.1 10*3/uL — ABNORMAL HIGH (ref 1.7–7.7)
Neutrophils Relative %: 74 %
Platelets: 295 10*3/uL (ref 150–400)
RBC: 2.9 MIL/uL — ABNORMAL LOW (ref 3.87–5.11)
RDW: 14.9 % (ref 11.5–15.5)
WBC: 16.5 10*3/uL — ABNORMAL HIGH (ref 4.0–10.5)
nRBC: 0.1 % (ref 0.0–0.2)

## 2022-09-21 LAB — LIPID PANEL
Cholesterol: 112 mg/dL (ref 0–200)
HDL: 39 mg/dL — ABNORMAL LOW (ref >40)
LDL Cholesterol: 38 mg/dL (ref 0–99)
Total CHOL/HDL Ratio: 2.9 RATIO
Triglycerides: 173 mg/dL — ABNORMAL HIGH (ref <150)
VLDL: 35 mg/dL (ref 0–40)

## 2022-09-21 LAB — COMPREHENSIVE METABOLIC PANEL
ALT: 15 U/L (ref 0–44)
AST: 14 U/L — ABNORMAL LOW (ref 15–41)
Albumin: 3 g/dL — ABNORMAL LOW (ref 3.5–5.0)
Alkaline Phosphatase: 48 U/L (ref 38–126)
Anion gap: 7 (ref 5–15)
BUN: 31 mg/dL — ABNORMAL HIGH (ref 8–23)
CO2: 25 mmol/L (ref 22–32)
Calcium: 8.5 mg/dL — ABNORMAL LOW (ref 8.9–10.3)
Chloride: 108 mmol/L (ref 98–111)
Creatinine, Ser: 1.26 mg/dL — ABNORMAL HIGH (ref 0.44–1.00)
GFR, Estimated: 43 mL/min — ABNORMAL LOW (ref >60)
Glucose, Bld: 115 mg/dL — ABNORMAL HIGH (ref 70–99)
Potassium: 4 mmol/L (ref 3.5–5.1)
Sodium: 140 mmol/L (ref 135–145)
Total Bilirubin: 0.7 mg/dL (ref 0.3–1.2)
Total Protein: 5.5 g/dL — ABNORMAL LOW (ref 6.5–8.1)

## 2022-09-21 LAB — MAGNESIUM: Magnesium: 2.2 mg/dL (ref 1.7–2.4)

## 2022-09-21 SURGERY — ESOPHAGOGASTRODUODENOSCOPY (EGD) WITH PROPOFOL
Anesthesia: Monitor Anesthesia Care

## 2022-09-21 MED ORDER — LIDOCAINE HCL (CARDIAC) PF 100 MG/5ML IV SOSY
PREFILLED_SYRINGE | INTRAVENOUS | Status: DC | PRN
  Administered 2022-09-21: 60 mg via INTRAVENOUS

## 2022-09-21 MED ORDER — SODIUM CHLORIDE 0.9 % IV SOLN: INTRAVENOUS | Status: DC

## 2022-09-21 MED ORDER — PANTOPRAZOLE SODIUM 40 MG PO TBEC
40.0000 mg | DELAYED_RELEASE_TABLET | Freq: Two times a day (BID) | ORAL | Status: DC
  Administered 2022-09-21 – 2022-09-26 (×10): 40 mg via ORAL
  Filled 2022-09-21 (×10): qty 1

## 2022-09-21 MED ORDER — MELATONIN 5 MG PO TABS
5.0000 mg | ORAL_TABLET | Freq: Once | ORAL | Status: AC
  Administered 2022-09-21: 5 mg via ORAL
  Filled 2022-09-21: qty 1

## 2022-09-21 MED ORDER — PHENYLEPHRINE 80 MCG/ML (10ML) SYRINGE FOR IV PUSH (FOR BLOOD PRESSURE SUPPORT)
PREFILLED_SYRINGE | INTRAVENOUS | Status: DC | PRN
  Administered 2022-09-21 (×2): 80 ug via INTRAVENOUS

## 2022-09-21 MED ORDER — LACTATED RINGERS IV SOLN: INTRAVENOUS | Status: AC

## 2022-09-21 MED ORDER — PROPOFOL 500 MG/50ML IV EMUL
INTRAVENOUS | Status: DC | PRN
  Administered 2022-09-21: 150 ug/kg/min via INTRAVENOUS

## 2022-09-21 MED ORDER — IOHEXOL 350 MG/ML SOLN
65.0000 mL | Freq: Once | INTRAVENOUS | Status: AC | PRN
  Administered 2022-09-21: 65 mL via INTRAVENOUS

## 2022-09-21 MED ORDER — FAMOTIDINE 20 MG PO TABS: 20.0000 mg | ORAL_TABLET | Freq: Every day | ORAL | Status: DC

## 2022-09-21 MED ORDER — PROPOFOL 10 MG/ML IV BOLUS
INTRAVENOUS | Status: DC | PRN
  Administered 2022-09-21: 20 mg via INTRAVENOUS
  Administered 2022-09-21: 30 mg via INTRAVENOUS
  Administered 2022-09-21: 20 mg via INTRAVENOUS

## 2022-09-21 SURGICAL SUPPLY — 15 items

## 2022-09-21 NOTE — Op Note (Signed)
[facility] Patient Name: Cheryl Goodman Procedure Date: 09/21/2022 MRN: [id number] Attending MD: Willis Modena , MD, [id number] Date of Birth: [birth year] CSN: [id number] Age: 81 Admit Type: Inpatient Procedure:                Upper GI endoscopy Indications:              Acute post hemorrhagic anemia, Melena Providers:                Willis Modena, MD, Fransisca Connors, Irene Shipper,                            Technician, April Carver, CRNA Referring MD:             [facility]. Medicines:                Monitored Anesthesia Care Complications:            No immediate complications. Estimated Blood Loss:     Estimated blood loss: none. Procedure:                Pre-Anesthesia Assessment:                           - Prior to the procedure, a History and Physical                            was performed, and patient medications and                            allergies were reviewed. The patient's tolerance of                            previous anesthesia was also reviewed. The risks                            and benefits of the procedure and the sedation                            options and risks were discussed with the patient.                            All questions were answered, and informed consent                            was obtained. Prior Anticoagulants: The patient has                            taken Plavix (clopidogrel), last dose was 3 days                            prior to procedure. ASA Grade Assessment: IV - A                            patient with severe systemic disease that is a  constant threat to life. After reviewing the risks                            and benefits, the patient was deemed in                            satisfactory condition to undergo the procedure.                           After obtaining informed consent, the endoscope was                            passed under direct vision.  Throughout the                            procedure, the patient's blood pressure, pulse, and                            oxygen saturations were monitored continuously. The                            [id number]-[id number] ([id number] Olympus endoscope was introduced                            through the mouth, and advanced to the second part                            of duodenum. The upper GI endoscopy was                            accomplished without difficulty. The patient                            tolerated the procedure well. Scope In: Scope Out: Findings:      A large hiatal hernia was present.      A few small erosions were found in the cardia, in hernia sac, most       consistent with Cameron's erosions.      The exam of the esophagus was otherwise normal.      Patchy mild inflammation was found in the gastric antrum and in the       prepyloric region of the stomach.      The exam of the stomach was otherwise normal.      The duodenal bulb, first portion of the duodenum and second portion of       the duodenum were normal. No old/fresh blood seen. Impression:               - Large hiatal hernia.                           - A few erosions in the cardia consistent with                            Cameron's erosions.                           -  Gastritis.                           - Normal duodenal bulb, first portion of the                            duodenum and second portion of the duodenum.                           - Suspect bleeding Cameron's erosions; no active                            bleeding seen. Moderate Sedation:      Not Applicable - Patient had care per Anesthesia. Recommendation:           - Return patient to hospital ward for ongoing care.                           - Soft diet today.                           - Continue present medications.                           - Needs pantoprazole or equivalent bid x 8 weeks,                            then qd thereafter  indefinitely.                           - Ok to resume clopidigrel anticoagulation now.                           [facility] will sign-off; patient should follow-up                            with PCP and GI doctors locally for follow-up and                            repeat CBC. Procedure Code(s):        --- Professional ---                           [id number], Esophagogastroduodenoscopy, flexible,                            transoral; diagnostic, including collection of                            specimen(s) by brushing or washing, when performed                            (separate procedure) Diagnosis Code(s):        --- Professional ---                           K44.9,  Diaphragmatic hernia without obstruction or                            gangrene                           K22.10, Ulcer of esophagus without bleeding                           K29.70, Gastritis, unspecified, without bleeding                           D62, Acute posthemorrhagic anemia                           K92.1, Melena (includes Hematochezia) CPT copyright 2022 American [employer] Association. All rights reserved. The codes documented in this report are preliminary and upon coder review may  be revised to meet current compliance requirements. Willis Modena, MD 09/21/2022 1:31:26 PM This report has been signed electronically. Number of Addenda: 0

## 2022-09-21 NOTE — Interval H&P Note (Signed)
History and Physical Interval Note:  09/21/2022 12:32 PM  Cheryl Goodman  has presented today for surgery, with the diagnosis of anemia, melena.  The various methods of treatment have been discussed with the patient and family. After consideration of risks, benefits and other options for treatment, the patient has consented to  Procedure(s): ESOPHAGOGASTRODUODENOSCOPY (EGD) WITH PROPOFOL (N/A) as a surgical intervention.  The patient's history has been reviewed, patient examined, no change in status, stable for surgery.  I have reviewed the patient's chart and labs.  Questions were answered to the patient's satisfaction.     Freddy Jaksch

## 2022-09-21 NOTE — Anesthesia Procedure Notes (Signed)
Procedure Name: MAC Date/Time: 09/21/2022 1:01 PM  Performed by: Lieutenant Diego, CRNAPre-anesthesia Checklist: Emergency Drugs available, Suction available, Patient being monitored, Timeout performed and Patient identified Patient Re-evaluated:Patient Re-evaluated prior to induction Oxygen Delivery Method: Nasal cannula Preoxygenation: Pre-oxygenation with 100% oxygen Induction Type: IV induction

## 2022-09-21 NOTE — Transfer of Care (Signed)
Immediate Anesthesia Transfer of Care Note  Patient: TYLENA PRISK  Procedure(s) Performed: ESOPHAGOGASTRODUODENOSCOPY (EGD) WITH PROPOFOL  Patient Location: Endoscopy Unit  Anesthesia Type:MAC  Level of Consciousness: drowsy  Airway & Oxygen Therapy: Patient Spontanous Breathing and Patient connected to nasal cannula oxygen  Post-op Assessment: Report given to RN and Post -op Vital signs reviewed and stable  Post vital signs: Reviewed and stable  Last Vitals:  Vitals Value Taken Time  BP 98/49 09/21/22 1310  Temp    Pulse 68 09/21/22 1311  Resp 19 09/21/22 1311  SpO2 99 % 09/21/22 1311  Vitals shown include unvalidated device data.  Last Pain:  Vitals:   09/21/22 1228  TempSrc: Temporal  PainSc: 0-No pain      Patients Stated Pain Goal: 0 (37/95/58 3167)  Complications: No notable events documented.

## 2022-09-21 NOTE — Anesthesia Postprocedure Evaluation (Signed)
Anesthesia Post Note  Patient: Cheryl Goodman  Procedure(s) Performed: ESOPHAGOGASTRODUODENOSCOPY (EGD) WITH PROPOFOL     Patient location during evaluation: Endoscopy Anesthesia Type: MAC Level of consciousness: awake and alert Pain management: pain level controlled Vital Signs Assessment: post-procedure vital signs reviewed and stable Respiratory status: spontaneous breathing, nonlabored ventilation and respiratory function stable Cardiovascular status: blood pressure returned to baseline and stable Postop Assessment: no apparent nausea or vomiting Anesthetic complications: no   No notable events documented.  Last Vitals:  Vitals:   09/21/22 1331 09/21/22 1349  BP: (!) 119/55 128/73  Pulse: 67 74  Resp: 19 18  Temp:  36.6 C  SpO2: 95% 100%    Last Pain:  Vitals:   09/21/22 1349  TempSrc: Oral  PainSc:                  Lidia Collum

## 2022-09-21 NOTE — Progress Notes (Signed)
PROGRESS NOTE   Cheryl Goodman  YWV:[id number] DOB: [birth year] DOA: 09/18/2022 PCP: Danella Penton, MD   Date of Service: the patient was seen and examined on 09/21/2022  Brief Narrative:  81 year old female with past medical history of gastroesophageal reflux disease, hypertension, hyperlipidemia, obstructive sleep apnea and pertinent history of upper GI bleeding secondary to erosive gastritis requiring hospitalization (treated at [facility] 07/2019) followed by submassive bilateral pulmonary emboli and left lower extremity DVT also requiring hospitalization (also during 07/2019 treated with 6 months of Eliquis successfully).  Finally, patient also has a recent diagnosis of right corona radiata stroke diagnosed 08/19/2022 as an outpatient via MRI brain in [place] clinic managed as an outpatient with aspirin and Plavix.  Patient now presents to [facility] emergency department with complaints of 4 days of black tarry stool.    Upon evaluation in the emergency department CT angiogram of the abdomen pelvis was performed revealing no active extravasation to suggest bleeding.  Patient was initiated on intravenous Protonix.  EDP sent a secure chat to [facility] gastroenterology and the hospitalist group has now been called to assess the patient for admission the hospital.  Patient underwent upper endoscopy on 12/19 revealing small erosions in the cardia in the hernia sac consistent with Cameron's erosions.    Assessment and Plan: * Acute upper GI bleed Patient reports continued bouts of melena Hemoglobin remained stable essentially, currently 8.0 down from 8.3 yesterday. EGD found small erosions in the cardia and the hernia sac consistent with Cameron's erosions. Diet initiated Transitioning Protonix to oral 40 mg twice daily Will continue to monitor overnight due to patient's reports of ongoing melena, possible discharge tomorrow Continuing monitor hemoglobin via serial CBCs GI states that  they support resuming patient's outpatient antiplatelet therapy.    History of ischemic stroke Recent diagnosis of corona radiata stroke 1 month ago in the outpatient setting in mid November at the [place] clinic Patient was placed on aspirin and Plavix at that time but did not undergo a full stroke workup.   I discussed this case with Dr. Derry Lory with neurology in an effort to determine if dual antiplatelet therapy was still needed. Neurology states that patient may be treated with aspirin monotherapy once remainder of stroke workup is complete including echocardiogram, CT angiogram of the head and neck, lipid panel and hemoglobin A1c.  Will order remainder of workup with intent to resume aspirin 81 mg monotherapy tomorrow assuming there are no bleeding complications.    AKI (acute kidney injury) (HCC) Creatinine seems to have plateaued Continuing gentle intravenous hydration Continuing to eat Klebsiella pneumoniae UTI (pan sensitive) with ciprofloxacin Strict input and output monitoring Monitoring renal function and electrolytes with serial chemistries Avoiding nephrotoxic agents if at all possible   Essential hypertension Currently normotensive As needed intravenous antihypertensives for markedly elevated blood pressure.  UTI due to Klebsiella species Abnormal urinalysis on initial workup with urine culture growing out Klebsiella pneumoniae, pansensitive Considering acute kidney injury and abdominal pain on presentation patient is currently being treated with a 3-day course of ciprofloxacin  Mixed hyperlipidemia Continuing home regimen of lipid lowering therapy.   Gastroesophageal reflux disease Patient has been transition to an oral proton pump inhibitor as noted above.    Subjective:  Abdominal pain has resolved.  Patient reports several episodes of melena since yesterday.    Physical Exam:  Vitals:   09/21/22 1321 09/21/22 1331 09/21/22 1349 09/21/22 1951  BP:  (!) 111/50 (!) 119/55 128/73 (!) 121/47  Pulse: 71  67 74 74  Resp: (!) 21 19 18 18   Temp:   97.8 F (36.6 C) 98.9 F (37.2 C)  TempSrc:   Oral Oral  SpO2: 95% 95% 100% 97%  Weight:      Height:        Constitutional: Awake alert and oriented x3, no associated distress.   Skin: no rashes, no lesions, good skin turgor noted. Eyes: Pupils are equally reactive to light.  No evidence of scleral icterus or conjunctival pallor.  ENMT: Moist mucous membranes noted.  Posterior pharynx clear of any exudate or lesions.   Respiratory: clear to auscultation bilaterally, no wheezing, no crackles. Normal respiratory effort. No accessory muscle use.  Cardiovascular: Regular rate and rhythm, no murmurs / rubs / gallops. No extremity edema. 2+ pedal pulses. No carotid bruits.  Abdomen: Abdomen is soft and nontender.  No evidence of intra-abdominal masses.  Positive bowel sounds noted in all quadrants.   Musculoskeletal: No joint deformity upper and lower extremities. Good ROM, no contractures. Normal muscle tone.    Data Reviewed:  I have personally reviewed and interpreted labs, imaging.  Significant findings are   CBC: Recent Labs  Lab 09/18/22 1413 09/19/22 0029 09/19/22 0715 09/19/22 1816 09/20/22 1305 09/21/22 0808  WBC 20.9* 18.0* 16.0* 17.2* 15.2* 16.5*  NEUTROABS 15.9*  --  11.3*  --   --  12.1*  HGB 9.8* 8.5* 8.2* 8.4* 8.3* 8.0*  HCT 31.1* 27.3* 26.8* 27.2* 26.8* 26.4*  MCV 89.4 88.9 89.6 91.0 90.8 91.0  PLT 379 307 279 296 296 295   Basic Metabolic Panel: Recent Labs  Lab 09/18/22 1413 09/19/22 0722 09/20/22 0749 09/21/22 0808  NA 136 139 140 140  K 4.1 4.7 4.0 4.0  CL 105 108 110 108  CO2 23 25 24 25   GLUCOSE 146* 102* 104* 115*  BUN 64* 43* 33* 31*  CREATININE 1.46* 1.27* 1.31* 1.26*  CALCIUM 8.8* 8.6* 8.6* 8.5*  MG  --  2.6* 2.4 2.2   GFR: Estimated Creatinine Clearance: 32.9 mL/min (A) (by C-G formula based on SCr of 1.26 mg/dL (H)). Liver Function  Tests: Recent Labs  Lab 09/18/22 1413 09/19/22 0722 09/20/22 0749 09/21/22 0808  AST 16 22 14* 14*  ALT 17 12 14 15   ALKPHOS 55 45 49 48  BILITOT 0.5 0.8 0.7 0.7  PROT 6.6 5.6* 5.7* 5.5*  ALBUMIN 3.5 3.0* 3.1* 3.0*    Coagulation Profile: Recent Labs  Lab 09/19/22 0722  INR 1.1      Code Status:  Full code.  Code status decision has been confirmed with: patient Family Communication: Husband is at the bedside and has been updated on plan of care.    Severity of Illness:  The appropriate patient status for this patient is OBSERVATION. Observation status is judged to be reasonable and necessary in order to provide the required intensity of service to ensure the patient's safety. The patient's presenting symptoms, physical exam findings, and initial radiographic and laboratory data in the context of their medical condition is felt to place them at decreased risk for further clinical deterioration. Furthermore, it is anticipated that the patient will be medically stable for discharge from the hospital within 2 midnights of admission.   Time spent:  50 minutes  Author:  09/23/22 MD  09/21/2022 11:49 PM

## 2022-09-21 NOTE — Anesthesia Preprocedure Evaluation (Signed)
Anesthesia Evaluation  Patient identified by MRN, date of birth, ID band Patient awake    Reviewed: Allergy & Precautions, NPO status , Patient's Chart, lab work & pertinent test results  History of Anesthesia Complications Negative for: history of anesthetic complications  Airway Mallampati: II  TM Distance: >3 FB Neck ROM: Full    Dental  (+) Teeth Intact   Pulmonary sleep apnea , PE (2020)   Pulmonary exam normal        Cardiovascular hypertension, Normal cardiovascular exam     Neuro/Psych CVA (08/19/22)    GI/Hepatic Neg liver ROS, hiatal hernia,GERD  ,,Melena with anemia   Endo/Other  negative endocrine ROS    Renal/GU Renal Insufficiency and ARFRenal disease (Cr 1.26)  negative genitourinary   Musculoskeletal  (+) Arthritis ,    Abdominal   Peds  Hematology  (+) Blood dyscrasia, anemia Hgb 8.0   Anesthesia Other Findings   Reproductive/Obstetrics                              Anesthesia Physical Anesthesia Plan  ASA: 4  Anesthesia Plan: MAC   Post-op Pain Management: Minimal or no pain anticipated   Induction: Intravenous  PONV Risk Score and Plan: 2 and Propofol infusion, TIVA and Treatment may vary due to age or medical condition  Airway Management Planned: Natural Airway, Nasal Cannula and Simple Face Mask  Additional Equipment: None  Intra-op Plan:   Post-operative Plan:   Informed Consent: I have reviewed the patients History and Physical, chart, labs and discussed the procedure including the risks, benefits and alternatives for the proposed anesthesia with the patient or authorized representative who has indicated his/her understanding and acceptance.       Plan Discussed with:   Anesthesia Plan Comments:          Anesthesia Quick Evaluation

## 2022-09-22 ENCOUNTER — Inpatient Hospital Stay (HOSPITAL_COMMUNITY): Payer: Medicare HMO

## 2022-09-22 DIAGNOSIS — K922 Gastrointestinal hemorrhage, unspecified: Secondary | ICD-10-CM | POA: Diagnosis not present

## 2022-09-22 DIAGNOSIS — I10 Essential (primary) hypertension: Secondary | ICD-10-CM | POA: Diagnosis not present

## 2022-09-22 LAB — COMPREHENSIVE METABOLIC PANEL
ALT: 14 U/L (ref 0–44)
AST: 15 U/L (ref 15–41)
Albumin: 3 g/dL — ABNORMAL LOW (ref 3.5–5.0)
Alkaline Phosphatase: 54 U/L (ref 38–126)
Anion gap: 7 (ref 5–15)
BUN: 21 mg/dL (ref 8–23)
CO2: 22 mmol/L (ref 22–32)
Calcium: 8.6 mg/dL — ABNORMAL LOW (ref 8.9–10.3)
Chloride: 110 mmol/L (ref 98–111)
Creatinine, Ser: 1.24 mg/dL — ABNORMAL HIGH (ref 0.44–1.00)
GFR, Estimated: 44 mL/min — ABNORMAL LOW (ref >60)
Glucose, Bld: 102 mg/dL — ABNORMAL HIGH (ref 70–99)
Potassium: 3.8 mmol/L (ref 3.5–5.1)
Sodium: 139 mmol/L (ref 135–145)
Total Bilirubin: 0.7 mg/dL (ref 0.3–1.2)
Total Protein: 5.6 g/dL — ABNORMAL LOW (ref 6.5–8.1)

## 2022-09-22 LAB — CBC WITH DIFFERENTIAL/PLATELET
Abs Immature Granulocytes: 0.17 10*3/uL — ABNORMAL HIGH (ref 0.00–0.07)
Basophils Absolute: 0 10*3/uL (ref 0.0–0.1)
Basophils Relative: 0 %
Eosinophils Absolute: 0.4 10*3/uL (ref 0.0–0.5)
Eosinophils Relative: 2 %
HCT: 28.3 % — ABNORMAL LOW (ref 36.0–46.0)
Hemoglobin: 8.1 g/dL — ABNORMAL LOW (ref 12.0–15.0)
Immature Granulocytes: 1 %
Lymphocytes Relative: 14 %
Lymphs Abs: 2.3 10*3/uL (ref 0.7–4.0)
MCH: 27.4 pg (ref 26.0–34.0)
MCHC: 28.6 g/dL — ABNORMAL LOW (ref 30.0–36.0)
MCV: 95.6 fL (ref 80.0–100.0)
Monocytes Absolute: 1 10*3/uL (ref 0.1–1.0)
Monocytes Relative: 6 %
Neutro Abs: 12.1 10*3/uL — ABNORMAL HIGH (ref 1.7–7.7)
Neutrophils Relative %: 77 %
Platelets: 320 10*3/uL (ref 150–400)
RBC: 2.96 MIL/uL — ABNORMAL LOW (ref 3.87–5.11)
RDW: 15.3 % (ref 11.5–15.5)
WBC: 16 10*3/uL — ABNORMAL HIGH (ref 4.0–10.5)
nRBC: 0 % (ref 0.0–0.2)

## 2022-09-22 LAB — ECHOCARDIOGRAM COMPLETE BUBBLE STUDY
Area-P 1/2: 4.41 cm2
S' Lateral: 2 cm

## 2022-09-22 LAB — HEMOGLOBIN A1C
Hgb A1c MFr Bld: 6.2 % — ABNORMAL HIGH (ref 4.8–5.6)
Mean Plasma Glucose: 131 mg/dL

## 2022-09-22 LAB — UREA NITROGEN, URINE: Urea Nitrogen, Ur: 1016 mg/dL

## 2022-09-22 LAB — MAGNESIUM: Magnesium: 2.2 mg/dL (ref 1.7–2.4)

## 2022-09-22 MED ORDER — SENNOSIDES-DOCUSATE SODIUM 8.6-50 MG PO TABS
1.0000 | ORAL_TABLET | Freq: Every evening | ORAL | Status: DC | PRN
  Filled 2022-09-22: qty 1

## 2022-09-22 MED ORDER — GUAIFENESIN 100 MG/5ML PO LIQD
5.0000 mL | ORAL | Status: DC | PRN
  Administered 2022-09-26: 5 mL via ORAL
  Filled 2022-09-22: qty 10

## 2022-09-22 MED ORDER — METOPROLOL TARTRATE 5 MG/5ML IV SOLN: 5.0000 mg | INTRAVENOUS | Status: DC | PRN

## 2022-09-22 MED ORDER — MELATONIN 3 MG PO TABS
3.0000 mg | ORAL_TABLET | Freq: Once | ORAL | Status: AC
  Administered 2022-09-22: 3 mg via ORAL
  Filled 2022-09-22: qty 1

## 2022-09-22 MED ORDER — IPRATROPIUM-ALBUTEROL 0.5-2.5 (3) MG/3ML IN SOLN: 3.0000 mL | RESPIRATORY_TRACT | Status: DC | PRN

## 2022-09-22 MED ORDER — SODIUM CHLORIDE 0.9% FLUSH
10.0000 mL | Freq: Once | INTRAVENOUS | Status: AC
  Administered 2022-09-22: 10 mL via INTRAVENOUS

## 2022-09-22 MED ORDER — ASPIRIN 81 MG PO CHEW
81.0000 mg | CHEWABLE_TABLET | Freq: Every day | ORAL | Status: DC
  Administered 2022-09-22 – 2022-09-23 (×2): 81 mg via ORAL
  Filled 2022-09-22 (×2): qty 1

## 2022-09-22 MED ORDER — HYDRALAZINE HCL 20 MG/ML IJ SOLN: 10.0000 mg | INTRAMUSCULAR | Status: DC | PRN

## 2022-09-22 NOTE — Progress Notes (Signed)
PT Cancellation Note  Patient Details Name: Cheryl Goodman MRN: [id number] DOB: [birth year]   Cancelled Treatment:    Reason Eval/Treat Not Completed: PT screened, no needs identified, will sign off Patient observed ambulating in hall W/ OT, no support nor DMe. Blanchard Kelch PT Acute Rehabilitation Services Office [phone] Weekend [phone]   Rada Hay 09/22/2022, 11:46 AM

## 2022-09-22 NOTE — Evaluation (Signed)
Occupational Therapy Evaluation Patient Details Name: Cheryl Goodman MRN: [id number] DOB: Mar 01, 1941 Today's Date: 09/22/2022   History of Present Illness Patient is a 81 year old female who presented with 4 day history of black tarry stools. On   12/19 patient underwent an upper endoscopy revealing small erosions in the cardia in the hernia sac consistent with Cameron's erosions.PMH:PMH: GERD, HTN, hyperlipidemia, OSA   Clinical Impression   Patient evaluated by Occupational Therapy with no further acute OT needs identified. All education has been completed and the patient has no further questions. Patient is MI for ADLs with husband able to support as needed at home.  See below for any follow-up Occupational Therapy or equipment needs. OT is signing off. Thank you for this referral.       Recommendations for follow up therapy are one component of a multi-disciplinary discharge planning process, led by the attending physician.  Recommendations may be updated based on patient status, additional functional criteria and insurance authorization.   Follow Up Recommendations  No OT follow up     Assistance Recommended at Discharge PRN  Patient can return home with the following Assistance with cooking/housework    Functional Status Assessment  Patient has not had a recent decline in their functional status  Equipment Recommendations  None recommended by OT       Precautions / Restrictions Restrictions Weight Bearing Restrictions: No      Mobility Bed Mobility Overal bed mobility: Modified Independent        Transfers Overall transfer level: Modified independent                        Balance Overall balance assessment: Mild deficits observed, not formally tested           ADL either performed or assessed with clinical judgement   ADL Overall ADL's : Modified independent         General ADL Comments: patient took a shower this AM with husband  supervision. patient was noted to scissor gait at times during mobility in hospital but was able to fix with cues. patient was MI for mobiltiy in room and bed mobility. able to reach BLE. patient endorsed being at baseline for ADLs at this time.     Vision Patient Visual Report: No change from baseline              Pertinent Vitals/Pain Pain Assessment Pain Assessment: No/denies pain     Hand Dominance Right   Extremity/Trunk Assessment Upper Extremity Assessment Upper Extremity Assessment: Overall WFL for tasks assessed   Lower Extremity Assessment Lower Extremity Assessment: Overall WFL for tasks assessed   Cervical / Trunk Assessment Cervical / Trunk Assessment: Normal   Communication Communication Communication: No difficulties   Cognition Arousal/Alertness: Awake/alert Behavior During Therapy: WFL for tasks assessed/performed Overall Cognitive Status: Within Functional Limits for tasks assessed                        Home Living Family/patient expects to be discharged to:: Private residence Living Arrangements: Spouse/significant other Available Help at Discharge: Family;Available 24 hours/day Type of Home: House Home Access: Stairs to enter              Prior Functioning/Environment Prior Level of Function : Independent/Modified Independent                        OT Goals(Current goals can be found  in the care plan section) Acute Rehab OT Goals OT Goal Formulation: All assessment and education complete, DC therapy  OT Frequency:         AM-PAC OT "6 Clicks" Daily Activity     Outcome Measure Help from another person eating meals?: None Help from another person taking care of personal grooming?: None Help from another person toileting, which includes using toliet, bedpan, or urinal?: None Help from another person bathing (including washing, rinsing, drying)?: None Help from another person to put on and taking off regular upper body  clothing?: None Help from another person to put on and taking off regular lower body clothing?: None 6 Click Score: 24   End of Session Equipment Utilized During Treatment: Gait belt Nurse Communication: Mobility status  Activity Tolerance: Patient tolerated treatment well Patient left: in bed;with call bell/phone within reach;with family/visitor present  OT Visit Diagnosis: Unsteadiness on feet (R26.81)                Time: [id number] OT Time Calculation (min): 11 min Charges:  OT General Charges $OT Visit: 1 Visit OT Evaluation $OT Eval Low Complexity: 1 Low  Selam Pietsch OTR/L, MS Acute Rehabilitation Department Office# [phone]   Willa Rough 09/22/2022, 12:55 PM

## 2022-09-22 NOTE — Progress Notes (Signed)
PROGRESS NOTE    Cheryl Goodman  [id number] DOB: [birth year] DOA: 09/18/2022 PCP: [name], MD   Brief Narrative:  81 year old female with past medical history of gastroesophageal reflux disease, hypertension, hyperlipidemia, obstructive sleep apnea and pertinent history of upper GI bleeding secondary to erosive gastritis requiring hospitalization (treated at [facility] 07/2019) followed by submassive bilateral pulmonary emboli and left lower extremity DVT also requiring hospitalization (also during 07/2019 treated with 6 months of Eliquis successfully). Finally, patient also has a recent diagnosis of right corona radiata stroke diagnosed 08/19/2022 as an outpatient via MRI brain in [facility] managed as an outpatient with aspirin and Plavix.  Outpatient she developed melanotic stool therefore admitted to the hospital.  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  GI recommended PPI twice daily and okay to resume antiplatelet.  Previous provider also discussed case with neurology who recommended only resuming aspirin.  Rest of the workup including LDL, A1c, CTA head and neck were unremarkable.   Assessment & Plan:  Principal Problem:   Acute upper GI bleed Active Problems:   History of ischemic stroke   AKI (acute kidney injury) ([place])   UTI due to Klebsiella species   Essential hypertension   Mixed hyperlipidemia   Gastroesophageal reflux disease     Assessment and Plan: * Acute upper GI bleed Patient does have extensive history of previous GI bleed from erosion/H. pylori.  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  Cleared to resume antiplatelets per GI.  Hemoglobin remained stable at this time.  Will place patient on PPI twice daily for minimum 8 weeks.  Hemoglobin this morning is stable   History of ischemic stroke This was recently diagnosed on outpatient MRI on  11/16 and started on aspirin and Plavix.  Unfortunately she developed GI bleed therefore got admitted.  Previous provider discussed case with neurology who recommended resuming only aspirin and completing rest of the stroke workup. LDL 38, A1c 6.2, CTA head and neck did not show any large vessel occlusion besides severe stenosis of the P2 segment of the left.  Would recommend outpatient follow-up with neurology. Echocardiogram-pending  AKI (acute kidney injury) ([place]) Patient's renal function is now at baseline   Essential hypertension -Currently soft blood pressure.  Continue IV as needed metoprolol and hydralazine.  UTI due to Klebsiella species Urine cultures are on Klebsiella, complete total 3-day course of Cipro  Mixed hyperlipidemia Patient is on already on Zocor   Gastroesophageal reflux disease PPI twice daily    DVT prophylaxis: SCDs Start: 09/18/22 1653 Code Status: Full code Family Communication: Spouse at the side  Status is: Inpatient Patient is still having some colonic stool, echocardiogram pending.  Continue hospital stay, hopefully home tomorrow   Subjective:  Patient seen and examined at bedside.  Reports that she still having melanotic stools but her hemoglobin remained stable.  Overall feels weak and does not have any other complaints.  Does tell me she has had extensive history of GI bleeding in the past back in 2020 as well  Examination:  General exam: Appears calm and comfortable  Respiratory system: Clear to auscultation. Respiratory effort normal. Cardiovascular system: S1 & S2 heard, RRR. No JVD, murmurs, rubs, gallops or clicks. No pedal edema. Gastrointestinal system: Abdomen is nondistended, soft and nontender. No organomegaly or masses felt. Normal bowel sounds heard. Central nervous system: Alert and oriented. No focal neurological deficits. Extremities: Symmetric 5 x  5 power. Skin: No rashes, lesions or ulcers Psychiatry: Judgement and  insight appear normal. Mood & affect appropriate.     Objective: Vitals:   09/21/22 1331 09/21/22 1349 09/21/22 1951 09/22/22 0350  BP: (!) 119/55 128/73 (!) 121/47 (!) 116/52  Pulse: 67 74 74 71  Resp: 19 18 18 18   Temp:  97.8 F (36.6 C) 98.9 F (37.2 C) 98.4 F (36.9 C)  TempSrc:  Oral Oral Oral  SpO2: 95% 100% 97% 96%  Weight:      Height:        Intake/Output Summary (Last 24 hours) at 09/22/2022 1146 Last data filed at 09/22/2022 0400 Gross per 24 hour  Intake 1454.23 ml  Output --  Net 1454.23 ml   Filed Weights   09/18/22 1352  Weight: 83.9 kg     Data Reviewed:   CBC: Recent Labs  Lab 09/18/22 1413 09/19/22 0029 09/19/22 0715 09/19/22 1816 09/20/22 1305 09/21/22 0808 09/22/22 0732  WBC 20.9*   < > 16.0* 17.2* 15.2* 16.5* 16.0*  NEUTROABS 15.9*  --  11.3*  --   --  12.1* 12.1*  HGB 9.8*   < > 8.2* 8.4* 8.3* 8.0* 8.1*  HCT 31.1*   < > 26.8* 27.2* 26.8* 26.4* 28.3*  MCV 89.4   < > 89.6 91.0 90.8 91.0 95.6  PLT 379   < > 279 296 296 295 320   < > = values in this interval not displayed.   Basic Metabolic Panel: Recent Labs  Lab 09/18/22 1413 09/19/22 0722 09/20/22 0749 09/21/22 0808 09/22/22 0732  NA 136 139 140 140 139  K 4.1 4.7 4.0 4.0 3.8  CL 105 108 110 108 110  CO2 23 25 24 25 22   GLUCOSE 146* 102* 104* 115* 102*  BUN 64* 43* 33* 31* 21  CREATININE 1.46* 1.27* 1.31* 1.26* 1.24*  CALCIUM 8.8* 8.6* 8.6* 8.5* 8.6*  MG  --  2.6* 2.4 2.2 2.2   GFR: Estimated Creatinine Clearance: 33.4 mL/min (A) (by C-G formula based on SCr of 1.24 mg/dL (H)). Liver Function Tests: Recent Labs  Lab 09/18/22 1413 09/19/22 0722 09/20/22 0749 09/21/22 0808 09/22/22 0732  AST 16 22 14* 14* 15  ALT 17 12 14 15 14   ALKPHOS 55 45 49 48 54  BILITOT 0.5 0.8 0.7 0.7 0.7  PROT 6.6 5.6* 5.7* 5.5* 5.6*  ALBUMIN 3.5 3.0* 3.1* 3.0* 3.0*   Recent Labs  Lab 09/18/22 1413  LIPASE 41   No results for input(s): "AMMONIA" in the last 168  hours. Coagulation Profile: Recent Labs  Lab 09/19/22 0722  INR 1.1   Cardiac Enzymes: No results for input(s): "CKTOTAL", "CKMB", "CKMBINDEX", "TROPONINI" in the last 168 hours. BNP (last 3 results) No results for input(s): "PROBNP" in the last 8760 hours. HbA1C: Recent Labs    09/21/22 1644  HGBA1C 6.2*   CBG: No results for input(s): "GLUCAP" in the last 168 hours. Lipid Profile: Recent Labs    09/21/22 1644  CHOL 112  HDL 39*  LDLCALC 38  TRIG 09/21/22*  CHOLHDL 2.9   Thyroid Function Tests: No results for input(s): "TSH", "T4TOTAL", "FREET4", "T3FREE", "THYROIDAB" in the last 72 hours. Anemia Panel: Recent Labs    09/19/22 1816  TIBC 441  IRON 26*   Sepsis Labs: No results for input(s): "PROCALCITON", "LATICACIDVEN" in the last 168 hours.  Recent Results (from the past 240 hour(s))  Resp panel by RT-PCR (RSV, Flu A&B, Covid) Anterior Nasal Swab  Status: None   Collection Time: 09/18/22  2:00 PM   Specimen: Anterior Nasal Swab  Result Value Ref Range Status   SARS Coronavirus 2 by RT PCR NEGATIVE NEGATIVE Final    Comment: (NOTE) SARS-CoV-2 target nucleic acids are NOT DETECTED.  The SARS-CoV-2 RNA is generally detectable in upper respiratory specimens during the acute phase of infection. The lowest concentration of SARS-CoV-2 viral copies this assay can detect is 138 copies/mL. A negative result does not preclude SARS-Cov-2 infection and should not be used as the sole basis for treatment or other patient management decisions. A negative result may occur with  improper specimen collection/handling, submission of specimen other than nasopharyngeal swab, presence of viral mutation(s) within the areas targeted by this assay, and inadequate number of viral copies(<138 copies/mL). A negative result must be combined with clinical observations, patient history, and epidemiological information. The expected result is Negative.  Fact Sheet for Patients:   EntrepreneurPulse.com.au  Fact Sheet for Healthcare Providers:  IncredibleEmployment.be  This test is no t yet approved or cleared by the [place] FDA and  has been authorized for detection and/or diagnosis of SARS-CoV-2 by FDA under an Emergency Use Authorization (EUA). This EUA will remain  in effect (meaning this test can be used) for the duration of the COVID-19 declaration under Section 564(b)(1) of the Act, 21 U.S.C.section 360bbb-3(b)(1), unless the authorization is terminated  or revoked sooner.       Influenza A by PCR NEGATIVE NEGATIVE Final   Influenza B by PCR NEGATIVE NEGATIVE Final    Comment: (NOTE) The Xpert Xpress SARS-CoV-2/FLU/RSV plus assay is intended as an aid in the diagnosis of influenza from Nasopharyngeal swab specimens and should not be used as a sole basis for treatment. Nasal washings and aspirates are unacceptable for Xpert Xpress SARS-CoV-2/FLU/RSV testing.  Fact Sheet for Patients: EntrepreneurPulse.com.au  Fact Sheet for Healthcare Providers: IncredibleEmployment.be  This test is not yet approved or cleared by the [place] FDA and has been authorized for detection and/or diagnosis of SARS-CoV-2 by FDA under an Emergency Use Authorization (EUA). This EUA will remain in effect (meaning this test can be used) for the duration of the COVID-19 declaration under Section 564(b)(1) of the Act, 21 U.S.C. section 360bbb-3(b)(1), unless the authorization is terminated or revoked.     Resp Syncytial Virus by PCR NEGATIVE NEGATIVE Final    Comment: (NOTE) Fact Sheet for Patients: EntrepreneurPulse.com.au  Fact Sheet for Healthcare Providers: IncredibleEmployment.be  This test is not yet approved or cleared by the [place] FDA and has been authorized for detection and/or diagnosis of SARS-CoV-2 by FDA under an Emergency Use  Authorization (EUA). This EUA will remain in effect (meaning this test can be used) for the duration of the COVID-19 declaration under Section 564(b)(1) of the Act, 21 U.S.C. section 360bbb-3(b)(1), unless the authorization is terminated or revoked.  Performed at [facility], [place] [address], [place], [place] [address]   Urine Culture     Status: Abnormal   Collection Time: 09/18/22  5:28 PM   Specimen: Urine, Clean Catch  Result Value Ref Range Status   Specimen Description   Final    URINE, CLEAN CATCH Performed at [facility], [place] [address] [place], [place] [address]    Special Requests   Final    NONE Performed at [facility] [place] [address], [place], [place] [address]    Culture >=100,000 COLONIES/mL KLEBSIELLA PNEUMONIAE (A)  Final   Report Status  09/20/2022 FINAL  Final   Organism ID, Bacteria KLEBSIELLA PNEUMONIAE (A)  Final      Susceptibility   Klebsiella pneumoniae - MIC*    AMPICILLIN RESISTANT Resistant     CEFAZOLIN <=4 SENSITIVE Sensitive     CEFEPIME <=0.12 SENSITIVE Sensitive     CEFTRIAXONE <=0.25 SENSITIVE Sensitive     CIPROFLOXACIN <=0.25 SENSITIVE Sensitive     GENTAMICIN <=1 SENSITIVE Sensitive     IMIPENEM <=0.25 SENSITIVE Sensitive     NITROFURANTOIN <=16 SENSITIVE Sensitive     TRIMETH/SULFA <=20 SENSITIVE Sensitive     AMPICILLIN/SULBACTAM <=2 SENSITIVE Sensitive     PIP/TAZO <=4 SENSITIVE Sensitive     * >=100,000 COLONIES/mL KLEBSIELLA PNEUMONIAE         Radiology Studies: CT ANGIO HEAD NECK W WO CM  Result Date: 09/21/2022 CLINICAL DATA:  Stroke suspected EXAM: CT ANGIOGRAPHY HEAD AND NECK TECHNIQUE: Multidetector CT imaging of the head and neck was performed using the standard protocol during bolus administration of intravenous contrast. Multiplanar CT image reconstructions and MIPs were obtained to evaluate the vascular anatomy. Carotid stenosis measurements (when applicable)  are obtained utilizing NASCET criteria, using the distal internal carotid diameter as the denominator. RADIATION DOSE REDUCTION: This exam was performed according to the departmental dose-optimization program which includes automated exposure control, adjustment of the mA and/or kV according to patient size and/or use of iterative reconstruction technique. CONTRAST:  18mL OMNIPAQUE IOHEXOL 350 MG/ML SOLN COMPARISON:  Brain MRI 08/19/2022 common carotid Doppler 06/29/2017 FINDINGS: CT HEAD FINDINGS Brain: There is no acute intracranial hemorrhage, extra-axial fluid collection, or acute territorial infarct. Background parenchymal volume is normal for age. The ventricles are normal in size. Background chronic small-vessel ischemic change and remote infarct in the left caudate head/lentiform nucleus are unchanged. There is no mass lesion.  There is no mass effect or midline shift. Vascular: See below. Skull: Normal. Negative for fracture or focal lesion. Sinuses/Orbits: There is mucosal thickening in the left sphenoid sinus. A left lens implant is noted. The globes and orbits are otherwise unremarkable. Other: None. Review of the MIP images confirms the above findings CTA NECK FINDINGS Aortic arch: The imaged aortic arch is normal. The origins of the major branch vessels are patent. The subclavian arteries are patent to the level imaged. Right carotid system: The right common, internal, and external carotid arteries are patent, without hemodynamically significant stenosis or occlusion. There is no dissection or aneurysm. Left carotid system: The left common, internal, and external carotid arteries are patent, without hemodynamically significant stenosis or occlusion. There is no dissection or aneurysm. Vertebral arteries: The vertebral arteries are patent, without hemodynamically significant stenosis or occlusion. There is no dissection or aneurysm. Skeleton: There is no acute osseous abnormality or suspicious osseous  lesion. There is no visible canal hematoma. Other neck: The soft tissues of the neck are unremarkable. Upper chest: The imaged lung apices are clear. Review of the MIP images confirms the above findings CTA HEAD FINDINGS Anterior circulation: There is calcified plaque in the carotid siphons bilaterally resulting in mild-to-moderate stenosis on the right and no greater than mild stenosis on the left. The bilateral MCAs are patent, without proximal stenosis or occlusion. The bilateral ACAs are patent, without proximal stenosis or occlusion. The anterior communicating artery is normal. There is no aneurysm or AVM. Posterior circulation: The bilateral V4 segments are patent. The basilar artery is patent. The major cerebellar arteries appear patent. The bilateral PCAs are patent. There is focal severe stenosis of the  left distal P2 segment (12-157). There is no other proximal stenosis or occlusion. A left posterior communicating artery is identified. There is no aneurysm or AVM. Venous sinuses: Patent. Anatomic variants: None. Review of the MIP images confirms the above findings IMPRESSION: 1. No acute intracranial pathology on initial noncontrast head CT. 2.  No emergent large vessel occlusion. 3. Focal severe stenosis of the left P2 segment. Otherwise, patent intracranial vasculature. 4. Patent vasculature of the neck with no hemodynamically significant stenosis or occlusion. Electronically Signed   By: Valetta Mole M.D.   On: 09/21/2022 18:09   US RENAL  Result Date: 09/20/2022 CLINICAL DATA:  Renal failure. EXAM: RENAL / URINARY TRACT ULTRASOUND COMPLETE COMPARISON:  April 22, 2008. FINDINGS: Right Kidney: Renal measurements: 10.1 x 5.0 x 4.5 cm. Echogenicity within normal limits. No mass or hydronephrosis visualized. Left Kidney: Renal measurements: 9.1 x 4.7 x 4.4 cm . Echogenicity within normal limits. No mass or hydronephrosis visualized. Bladder: Appears normal for degree of bladder distention. Calculated  prevoid volume of 50 mL. Other: None. IMPRESSION: Normal renal ultrasound. Electronically Signed   By: Marijo Conception M.D.   On: 09/20/2022 14:36        Scheduled Meds:  pantoprazole  40 mg Oral BID AC   simvastatin  20 mg Oral QHS   Continuous Infusions:   LOS: 2 days   Time spent= 35 mins    Korde Jeppsen Arsenio Loader, MD [facility]  If 7PM-7AM, please contact night-coverage  09/22/2022, 11:46 AM

## 2022-09-23 ENCOUNTER — Inpatient Hospital Stay (HOSPITAL_COMMUNITY): Payer: Medicare HMO

## 2022-09-23 DIAGNOSIS — R52 Pain, unspecified: Secondary | ICD-10-CM | POA: Diagnosis not present

## 2022-09-23 DIAGNOSIS — K922 Gastrointestinal hemorrhage, unspecified: Secondary | ICD-10-CM | POA: Diagnosis not present

## 2022-09-23 LAB — HEMOGLOBIN AND HEMATOCRIT, BLOOD
HCT: 29.5 % — ABNORMAL LOW (ref 36.0–46.0)
Hemoglobin: 9.1 g/dL — ABNORMAL LOW (ref 12.0–15.0)

## 2022-09-23 LAB — BASIC METABOLIC PANEL
Anion gap: 6 (ref 5–15)
BUN: 20 mg/dL (ref 8–23)
CO2: 23 mmol/L (ref 22–32)
Calcium: 8.1 mg/dL — ABNORMAL LOW (ref 8.9–10.3)
Chloride: 111 mmol/L (ref 98–111)
Creatinine, Ser: 1.21 mg/dL — ABNORMAL HIGH (ref 0.44–1.00)
GFR, Estimated: 45 mL/min — ABNORMAL LOW (ref >60)
Glucose, Bld: 101 mg/dL — ABNORMAL HIGH (ref 70–99)
Potassium: 4.1 mmol/L (ref 3.5–5.1)
Sodium: 140 mmol/L (ref 135–145)

## 2022-09-23 LAB — CBC
HCT: 25 % — ABNORMAL LOW (ref 36.0–46.0)
Hemoglobin: 7.3 g/dL — ABNORMAL LOW (ref 12.0–15.0)
MCH: 27.7 pg (ref 26.0–34.0)
MCHC: 29.2 g/dL — ABNORMAL LOW (ref 30.0–36.0)
MCV: 94.7 fL (ref 80.0–100.0)
Platelets: 213 10*3/uL (ref 150–400)
RBC: 2.64 MIL/uL — ABNORMAL LOW (ref 3.87–5.11)
RDW: 15.4 % (ref 11.5–15.5)
WBC: 13.6 10*3/uL — ABNORMAL HIGH (ref 4.0–10.5)
nRBC: 0 % (ref 0.0–0.2)

## 2022-09-23 LAB — PREPARE RBC (CROSSMATCH)

## 2022-09-23 LAB — MAGNESIUM: Magnesium: 2.2 mg/dL (ref 1.7–2.4)

## 2022-09-23 LAB — D-DIMER, QUANTITATIVE: D-Dimer, Quant: 0.82 ug/mL-FEU — ABNORMAL HIGH (ref 0.00–0.50)

## 2022-09-23 MED ORDER — IOHEXOL 350 MG/ML SOLN
75.0000 mL | Freq: Once | INTRAVENOUS | Status: AC | PRN
  Administered 2022-09-23: 75 mL via INTRAVENOUS

## 2022-09-23 MED ORDER — SODIUM CHLORIDE 0.9% IV SOLUTION: Freq: Once | INTRAVENOUS | Status: AC

## 2022-09-23 MED ORDER — MELATONIN 5 MG PO TABS
5.0000 mg | ORAL_TABLET | Freq: Every evening | ORAL | Status: AC | PRN
  Administered 2022-09-23 – 2022-09-24 (×2): 5 mg via ORAL
  Filled 2022-09-23 (×2): qty 1

## 2022-09-23 MED ORDER — DOCUSATE SODIUM 100 MG PO CAPS
100.0000 mg | ORAL_CAPSULE | Freq: Two times a day (BID) | ORAL | Status: DC
  Administered 2022-09-23 – 2022-09-26 (×6): 100 mg via ORAL
  Filled 2022-09-23 (×7): qty 1

## 2022-09-23 MED ORDER — HEPARIN (PORCINE) 25000 UT/250ML-% IV SOLN
1150.0000 [IU]/h | INTRAVENOUS | Status: DC
  Administered 2022-09-23: 1000 [IU]/h via INTRAVENOUS
  Administered 2022-09-24 – 2022-09-25 (×2): 1150 [IU]/h via INTRAVENOUS
  Filled 2022-09-23 (×3): qty 250

## 2022-09-23 MED ORDER — SODIUM CHLORIDE (PF) 0.9 % IJ SOLN
INTRAMUSCULAR | Status: AC
  Filled 2022-09-23: qty 50

## 2022-09-23 MED ORDER — POLYETHYLENE GLYCOL 3350 17 G PO PACK
17.0000 g | PACK | Freq: Two times a day (BID) | ORAL | Status: AC
  Administered 2022-09-23 (×2): 17 g via ORAL
  Filled 2022-09-23: qty 1

## 2022-09-23 NOTE — Progress Notes (Addendum)
PROGRESS NOTE    Cheryl Goodman  WUJ:[id number] DOB: 1941/09/30 DOA: 09/18/2022 PCP: Danella PentonMiller, Mark F, MD   Brief Narrative:  81 year old female with past medical history of gastroesophageal reflux disease, hypertension, hyperlipidemia, obstructive sleep apnea and pertinent history of upper GI bleeding secondary to erosive gastritis requiring hospitalization ([facility] at [facility] 07[facility]2019) followed by submassive bilateral pulmonary emboli and left lower extremity DVT also requiring hospitalization (also during 07/2019 treated with 6 months of Eliquis successfully). Finally, patient also has a recent diagnosis of right corona radiata stroke diagnosed 08/19/2022 as an outpatient [facility] managed as an outpatient with aspirin and Plavix.  Outpatient she developed melanotic stool therefore admitted to the hospital.  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  GI recommended PPI twice daily and okay to resume antiplatelet.  Previous provider also discussed case with neurology who recommended only resuming aspirin.  Rest of the workup including LDL, A1c, CTA head and neck were unremarkable.   Assessment & Plan:  Principal Problem:   Acute upper GI bleed Active Problems:   History of ischemic stroke   AKI (acute kidney injury) (HCC)   UTI due to Klebsiella species   Essential hypertension   Mixed hyperlipidemia   Gastroesophageal reflux disease     Assessment and Plan: * Acute upper GI bleed Patient does have extensive history of previous GI bleed from erosion/H. pylori.  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  Cleared to resume antiplatelets per GI.  Hemoglobin remained stable at this time.  Will place patient on PPI twice daily for minimum 8 weeks.  Hemoglobin this morning is stable Hemoglobin dropped down to 7.3, will transfuse 1 unit PRBC.   History of ischemic  stroke This was recently diagnosed on outpatient MRI on 11/16 and started on aspirin and Plavix.  Unfortunately she developed GI bleed therefore got admitted.  Previous provider discussed case with neurology who recommended resuming only aspirin and completing rest of the stroke workup. LDL 38, A1c 6.2, CTA head and neck did not show any large vessel occlusion besides severe stenosis of the P2 segment of the left.  Would recommend outpatient follow-up with neurology. Echocardiogram-EF of 65%, grade 1 DD but severe right RV strain  Abnormal echocardiogram - Showing severe right RV strain which appears to have progressed a little bit.  Given her extensive history of clots will get CTA to rule out PE, lower extremity Dopplers.  AKI (acute kidney injury) (HCC) Patient's renal function is now at baseline   Essential hypertension -Currently soft blood pressure.  Continue IV as needed metoprolol and hydralazine.  UTI due to Klebsiella species Urine cultures are on Klebsiella, complete total 3-day course of Cipro  Mixed hyperlipidemia Patient is on already on Zocor   Gastroesophageal reflux disease PPI twice daily  ADDENDUM AT 420PM  I was notified by the radiologist that patient does have pulmonary embolism which appears to be segmental on the CTA chest.  Patient at this time is not exhibiting any symptoms of shortness of breath, tachycardia or hypoxia.  Lower extremity Dopplers is negative for DVT.   I discussed case with pulmonary, Dr. Judeth HornHunsucker.  We decided that we will do a heparin challenge over next 48 hours and monitor for any bleeding symptoms.  If she remains stable, we will start p.o. anticoagulatioN but if she develops any sort of bleeding we will get formal pulmonary and hematology  consultation.  I appreciate input from pulmonary, Dr. Judeth Horn.   I also discussed this with the patient who agrees with our plan.  Will continue to monitor her in the hospital.        DVT  prophylaxis: SCDs Start: 09/18/22 1653 Code Status: Full code Family Communication: Spouse at the side  Status is: Inpatient Ongoing evaluation for possible underlying thromboembolic disease therefore we will get lower extremity Dopplers and CTA chest.  Will also give 1 unit PRBC transfusion   Subjective: Seen and examined at bedside.  Tells me she has not had any bowel movement in last 24 hours.  Denies any nausea and vomiting.  Did report of left lower extremity pain overnight therefore Doppler were ordered to rule out DVT  Examination:  Constitutional: Not in acute distress Respiratory: Clear to auscultation bilaterally Cardiovascular: Normal sinus rhythm, no rubs Abdomen: Nontender nondistended good bowel sounds Musculoskeletal: No edema noted Skin: No rashes seen Neurologic: CN 2-12 grossly intact.  And nonfocal Psychiatric: Normal judgment and insight. Alert and oriented x 3. Normal mood. Objective: Vitals:   09/23/22 1048 09/23/22 1124 09/23/22 1133 09/23/22 1136  BP: (!) 112/50 (!) 90/43 (!) 97/47   Pulse: 80 77 79 80  Resp: 16 17    Temp: 98.2 F (36.8 C) 98.1 F (36.7 C) 98.1 F (36.7 C)   TempSrc: Oral Oral Oral   SpO2: 98% 97% 97% 97%  Weight:      Height:       No intake or output data in the 24 hours ending 09/23/22 1145  Filed Weights   09/18/22 1352  Weight: 83.9 kg     Data Reviewed:   CBC: Recent Labs  Lab 09/18/22 1413 09/19/22 0029 09/19/22 0715 09/19/22 1816 09/20/22 1305 09/21/22 0808 09/22/22 0732 09/23/22 0546  WBC 20.9*   < > 16.0* 17.2* 15.2* 16.5* 16.0* 13.6*  NEUTROABS 15.9*  --  11.3*  --   --  12.1* 12.1*  --   HGB 9.8*   < > 8.2* 8.4* 8.3* 8.0* 8.1* 7.3*  HCT 31.1*   < > 26.8* 27.2* 26.8* 26.4* 28.3* 25.0*  MCV 89.4   < > 89.6 91.0 90.8 91.0 95.6 94.7  PLT 379   < > 279 296 296 295 320 213   < > = values in this interval not displayed.   Basic Metabolic Panel: Recent Labs  Lab 09/19/22 0722 09/20/22 0749  09/21/22 0808 09/22/22 0732 09/23/22 0546  NA 139 140 140 139 140  K 4.7 4.0 4.0 3.8 4.1  CL 108 110 108 110 111  CO2 GLUCOSE 102* 104* 115* 102* 101*  BUN 43* 33* 31* 21 20  CREATININE 1.27* 1.31* 1.26* 1.24* 1.21*  CALCIUM 8.6* 8.6* 8.5* 8.6* 8.1*  MG 2.6* 2.4 2.2 2.2 2.2   GFR: Estimated Creatinine Clearance: 34.3 mL/min (A) (by C-G formula based on SCr of 1.21 mg/dL (H)). Liver Function Tests: Recent Labs  Lab 09/18/22 1413 09/19/22 0722 09/20/22 0749 09/21/22 0808 09/22/22 0732  AST 16 22 14* 14* 15  ALT ALKPHOS 55 45 49 48 54  BILITOT 0.5 0.8 0.7 0.7 0.7  PROT 6.6 5.6* 5.7* 5.5* 5.6*  ALBUMIN 3.5 3.0* 3.1* 3.0* 3.0*   Recent Labs  Lab 09/18/22 1413  LIPASE 41   No results for input(s): "AMMONIA" in the last 168 hours. Coagulation Profile: Recent Labs  Lab 09/19/22 0722  INR 1.1  Cardiac Enzymes: No results for input(s): "CKTOTAL", "CKMB", "CKMBINDEX", "TROPONINI" in the last 168 hours. BNP (last 3 results) No results for input(s): "PROBNP" in the last 8760 hours. HbA1C: Recent Labs    09/21/22 1644  HGBA1C 6.2*   CBG: No results for input(s): "GLUCAP" in the last 168 hours. Lipid Profile: Recent Labs    09/21/22 1644  CHOL 112  HDL 39*  LDLCALC 38  TRIG 161*  CHOLHDL 2.9   Thyroid Function Tests: No results for input(s): "TSH", "T4TOTAL", "FREET4", "T3FREE", "THYROIDAB" in the last 72 hours. Anemia Panel: No results for input(s): "VITAMINB12", "FOLATE", "FERRITIN", "TIBC", "IRON", "RETICCTPCT" in the last 72 hours.  Sepsis Labs: No results for input(s): "PROCALCITON", "LATICACIDVEN" in the last 168 hours.  Recent Results (from the past 240 hour(s))  Resp panel by RT-PCR (RSV, Flu A&B, Covid) Anterior Nasal Swab     Status: None   Collection Time: 09/18/22  2:00 PM   Specimen: Anterior Nasal Swab  Result Value Ref Range Status   SARS Coronavirus 2 by RT PCR NEGATIVE NEGATIVE Final    Comment:  (NOTE) SARS-CoV-2 target nucleic acids are NOT DETECTED.  The SARS-CoV-2 RNA is generally detectable in upper respiratory specimens during the acute phase of infection. The lowest concentration of SARS-CoV-2 viral copies this assay can detect is 138 copies/mL. A negative result does not preclude SARS-Cov-2 infection and should not be used as the sole basis for treatment or other patient management decisions. A negative result may occur with  improper specimen collection/handling, submission of specimen other than nasopharyngeal swab, presence of viral mutation(s) within the areas targeted by this assay, and inadequate number of viral copies(<138 copies/mL). A negative result must be combined with clinical observations, patient history, and epidemiological information. The expected result is Negative.  Fact Sheet for Patients:  BloggerCourse.com  Fact Sheet for Healthcare Providers:  SeriousBroker.it  This test is no t yet approved or cleared by the [place] FDA and  has been authorized for detection and/or diagnosis of SARS-CoV-2 by FDA under an Emergency Use Authorization (EUA). This EUA will remain  in effect (meaning this test can be used) for the duration of the COVID-19 declaration under Section 564(b)(1) of the Act, 21 U.S.C.section 360bbb-3(b)(1), unless the authorization is terminated  or revoked sooner.       Influenza A by PCR NEGATIVE NEGATIVE Final   Influenza B by PCR NEGATIVE NEGATIVE Final    Comment: (NOTE) The Xpert Xpress SARS-CoV-2/FLU/RSV plus assay is intended as an aid in the diagnosis of influenza from Nasopharyngeal swab specimens and should not be used as a sole basis for treatment. Nasal washings and aspirates are unacceptable for Xpert Xpress SARS-CoV-2/FLU/RSV testing.  Fact Sheet for Patients: BloggerCourse.com  Fact Sheet for Healthcare  Providers: SeriousBroker.it  This test is not yet approved or cleared by the [place] FDA and has been authorized for detection and/or diagnosis of SARS-CoV-2 by FDA under an Emergency Use Authorization (EUA). This EUA will remain in effect (meaning this test can be used) for the duration of the COVID-19 declaration under Section 564(b)(1) of the Act, 21 U.S.C. section 360bbb-3(b)(1), unless the authorization is terminated or revoked.     Resp Syncytial Virus by PCR NEGATIVE NEGATIVE Final    Comment: (NOTE) Fact Sheet for Patients: BloggerCourse.com  Fact Sheet for Healthcare Providers: SeriousBroker.it  This test is not yet approved or cleared by the [place] FDA and has been authorized for detection and/or diagnosis of SARS-CoV-2 by FDA under  an Emergency Use Authorization (EUA). This EUA will remain in effect (meaning this test can be used) for the duration of the COVID-19 declaration under Section 564(b)(1) of the Act, 21 U.S.C. section 360bbb-3(b)(1), unless the authorization is terminated or revoked.  Performed at [facility], [address] [place], [place] [address]   Urine Culture     Status: Abnormal   Collection Time: 09/18/22  5:28 PM   Specimen: Urine, Clean Catch  Result Value Ref Range Status   Specimen Description   Final    URINE, CLEAN CATCH Performed at [facility], [address] [place], [place] [address]    Special Requests   Final    NONE Performed at [facility], [address] [place], [place] [address]    Culture >=100,000 COLONIES/mL KLEBSIELLA PNEUMONIAE (A)  Final   Report Status 09/20/2022 FINAL  Final   Organism ID, Bacteria KLEBSIELLA PNEUMONIAE (A)  Final      Susceptibility   Klebsiella pneumoniae - MIC*    AMPICILLIN RESISTANT Resistant     CEFAZOLIN <=4 SENSITIVE Sensitive     CEFEPIME  <=0.12 SENSITIVE Sensitive     CEFTRIAXONE <=0.25 SENSITIVE Sensitive     CIPROFLOXACIN <=0.25 SENSITIVE Sensitive     GENTAMICIN <=1 SENSITIVE Sensitive     IMIPENEM <=0.25 SENSITIVE Sensitive     NITROFURANTOIN <=16 SENSITIVE Sensitive     TRIMETH/SULFA <=20 SENSITIVE Sensitive     AMPICILLIN/SULBACTAM <=2 SENSITIVE Sensitive     PIP/TAZO <=4 SENSITIVE Sensitive     * >=100,000 COLONIES/mL KLEBSIELLA PNEUMONIAE         Radiology Studies: ECHOCARDIOGRAM COMPLETE BUBBLE STUDY  Result Date: 09/22/2022    ECHOCARDIOGRAM REPORT   Patient Name:   Cheryl Goodman Date of Exam: 09/22/2022 Medical Rec #:  [id number]          Height:       59.0 in Accession #:    [id number]         Weight:       185.0 lb Date of Birth:  1941-09-04          BSA:          1.784 m Patient Age:    81 years           BP:           125/65 mmHg Patient Gender: F                  HR:           84 bpm. Exam Location:  Inpatient Procedure: 2D Echo, Cardiac Doppler, Color Doppler and Saline Contrast Bubble            Study Indications:    Stroke 434.91 / I63.9  History:        Patient has prior history of Echocardiogram examinations, most                 recent 08/02/2019. Risk Factors:Hypertension, Dyslipidemia and                 Sleep Apnea.  Sonographer:    Leta Jungling RDCS Referring Phys: [id number] Deno Lunger [facility] IMPRESSIONS  1. Left ventricular ejection fraction, by estimation, is 60 to 65%. The left ventricle has normal function. The left ventricle has no regional wall motion abnormalities. Left ventricular diastolic parameters are consistent with Grade I diastolic dysfunction (impaired relaxation).  2. Right ventricular systolic function is low normal.  The right ventricular size is normal.  3. The mitral valve is grossly normal. No evidence of mitral valve regurgitation. No evidence of mitral stenosis.  4. The aortic valve is grossly normal. Aortic valve regurgitation is not visualized. No aortic stenosis is present.   5. Cannot exclude a small PFO by color doppler, however, agitated saline contrast bubble study was negative for right to left shunt. Comparison(s): Changes from prior study are noted. 08/02/2019: LVEF 60-65%, severe RV dysfunction (Right heart strain). Conclusion(s)/Recommendation(s): No intracardiac source of embolism detected on this transthoracic study. Consider a transesophageal echocardiogram to exclude cardiac source of embolism if clinically indicated. FINDINGS  Left Ventricle: Left ventricular ejection fraction, by estimation, is 60 to 65%. The left ventricle has normal function. The left ventricle has no regional wall motion abnormalities. The left ventricular internal cavity size was normal in size. There is  no left ventricular hypertrophy. Left ventricular diastolic parameters are consistent with Grade I diastolic dysfunction (impaired relaxation). Right Ventricle: The right ventricular size is normal. No increase in right ventricular wall thickness. Right ventricular systolic function is low normal. Left Atrium: Left atrial size was normal in size. Right Atrium: Right atrial size was normal in size. Pericardium: There is no evidence of pericardial effusion. Presence of epicardial fat layer. Mitral Valve: The mitral valve is grossly normal. No evidence of mitral valve regurgitation. No evidence of mitral valve stenosis. Tricuspid Valve: The tricuspid valve is grossly normal. Tricuspid valve regurgitation is not demonstrated. No evidence of tricuspid stenosis. Aortic Valve: The aortic valve is grossly normal. Aortic valve regurgitation is not visualized. No aortic stenosis is present. Pulmonic Valve: The pulmonic valve was grossly normal. Pulmonic valve regurgitation is trivial. No evidence of pulmonic stenosis. Aorta: The aortic root and ascending aorta are structurally normal, with no evidence of dilitation. IAS/Shunts: Cannot exclude a small PFO. Agitated saline contrast was given intravenously to  evaluate for intracardiac shunting. Agitated saline contrast bubble study was negative, with no evidence of any interatrial shunt.  LEFT VENTRICLE PLAX 2D LVIDd:         3.90 cm   Diastology LVIDs:         2.00 cm   LV e' medial:    7.18 cm/s LV PW:         0.90 cm   LV E/e' medial:  13.1 LV IVS:        0.90 cm   LV e' lateral:   5.98 cm/s LVOT diam:     1.70 cm   LV E/e' lateral: 15.8 LV SV:         46 LV SV Index:   26 LVOT Area:     2.27 cm  RIGHT VENTRICLE RV S prime:     15.70 cm/s LEFT ATRIUM             Index        RIGHT ATRIUM           Index LA diam:        2.90 cm 1.63 cm/m   RA Area:     11.00 cm LA Vol (A2C):   60.7 ml 34.02 ml/m  RA Volume:   18.80 ml  10.54 ml/m LA Vol (A4C):   54.0 ml 30.26 ml/m LA Biplane Vol: 57.5 ml 32.23 ml/m  AORTIC VALVE LVOT Vmax:   120.00 cm/s LVOT Vmean:  64.500 cm/s LVOT VTI:    0.204 m  AORTA Ao Root diam: 3.30 cm Ao Asc diam:  3.20 cm  MITRAL VALVE MV Area (PHT): 4.41 cm    SHUNTS MV Decel Time: 172 msec    Systemic VTI:  0.20 m MV E velocity: 94.30 cm/s  Systemic Diam: 1.70 cm MV A velocity: 81.40 cm/s MV E/A ratio:  1.16 Zoila Shutter MD Electronically signed by Zoila Shutter MD Signature Date/Time: 09/22/2022/2:36:53 PM    Final    CT ANGIO HEAD NECK W WO CM  Result Date: 09/21/2022 CLINICAL DATA:  Stroke suspected EXAM: CT ANGIOGRAPHY HEAD AND NECK TECHNIQUE: Multidetector CT imaging of the head and neck was performed using the standard protocol during bolus administration of intravenous contrast. Multiplanar CT image reconstructions and MIPs were obtained to evaluate the vascular anatomy. Carotid stenosis measurements (when applicable) are obtained utilizing NASCET criteria, using the distal internal carotid diameter as the denominator. RADIATION DOSE REDUCTION: This exam was performed according to the departmental dose-optimization program which includes automated exposure control, adjustment of the mA and/or kV according to patient size and/or use of  iterative reconstruction technique. CONTRAST:  65mL OMNIPAQUE IOHEXOL 350 MG/ML SOLN COMPARISON:  Brain MRI 08/19/2022 common carotid Doppler 06/29/2017 FINDINGS: CT HEAD FINDINGS Brain: There is no acute intracranial hemorrhage, extra-axial fluid collection, or acute territorial infarct. Background parenchymal volume is normal for age. The ventricles are normal in size. Background chronic small-vessel ischemic change and remote infarct in the left caudate head/lentiform nucleus are unchanged. There is no mass lesion.  There is no mass effect or midline shift. Vascular: See below. Skull: Normal. Negative for fracture or focal lesion. Sinuses/Orbits: There is mucosal thickening in the left sphenoid sinus. A left lens implant is noted. The globes and orbits are otherwise unremarkable. Other: None. Review of the MIP images confirms the above findings CTA NECK FINDINGS Aortic arch: The imaged aortic arch is normal. The origins of the major branch vessels are patent. The subclavian arteries are patent to the level imaged. Right carotid system: The right common, internal, and external carotid arteries are patent, without hemodynamically significant stenosis or occlusion. There is no dissection or aneurysm. Left carotid system: The left common, internal, and external carotid arteries are patent, without hemodynamically significant stenosis or occlusion. There is no dissection or aneurysm. Vertebral arteries: The vertebral arteries are patent, without hemodynamically significant stenosis or occlusion. There is no dissection or aneurysm. Skeleton: There is no acute osseous abnormality or suspicious osseous lesion. There is no visible canal hematoma. Other neck: The soft tissues of the neck are unremarkable. Upper chest: The imaged lung apices are clear. Review of the MIP images confirms the above findings CTA HEAD FINDINGS Anterior circulation: There is calcified plaque in the carotid siphons bilaterally resulting in  mild-to-moderate stenosis on the right and no greater than mild stenosis on the left. The bilateral MCAs are patent, without proximal stenosis or occlusion. The bilateral ACAs are patent, without proximal stenosis or occlusion. The anterior communicating artery is normal. There is no aneurysm or AVM. Posterior circulation: The bilateral V4 segments are patent. The basilar artery is patent. The major cerebellar arteries appear patent. The bilateral PCAs are patent. There is focal severe stenosis of the left distal P2 segment (12-157). There is no other proximal stenosis or occlusion. A left posterior communicating artery is identified. There is no aneurysm or AVM. Venous sinuses: Patent. Anatomic variants: None. Review of the MIP images confirms the above findings IMPRESSION: 1. No acute intracranial pathology on initial noncontrast head CT. 2.  No emergent large vessel occlusion. 3. Focal severe stenosis of the left P2  segment. Otherwise, patent intracranial vasculature. 4. Patent vasculature of the neck with no hemodynamically significant stenosis or occlusion. Electronically Signed   By: Lesia Hausen M.D.   On: 09/21/2022 18:09        Scheduled Meds:  aspirin  81 mg Oral Daily   docusate sodium  100 mg Oral BID   pantoprazole  40 mg Oral BID AC   polyethylene glycol  17 g Oral BID   simvastatin  20 mg Oral QHS   Continuous Infusions:   LOS: 3 days   Time spent= 35 mins    Earl Zellmer Joline Maxcy, MD [facility]  If 7PM-7AM, please contact night-coverage  09/23/2022, 11:45 AM

## 2022-09-23 NOTE — Progress Notes (Signed)
Hospitalist notified of of pt's c/o pain in the left leg. See new orders.

## 2022-09-23 NOTE — Progress Notes (Signed)
LLE venous duplex has been completed.    Results can be found under chart review under CV PROC. 09/23/2022 12:22 PM Jorgina Binning RVT, RDMS

## 2022-09-23 NOTE — Progress Notes (Signed)
ANTICOAGULATION CONSULT NOTE - Initial Consult  Pharmacy Consult for IV heparin  Indication: pulmonary embolus  Allergies  Allergen Reactions   Penicillins Rash and Other (See Comments)    Has patient had a PCN reaction causing immediate rash, facial/tongue/throat swelling, SOB or lightheadedness with hypotension: No Has patient had a PCN reaction causing severe rash involving mucus membranes or skin necrosis: No Has patient had a PCN reaction that required hospitalization: No Has patient had a PCN reaction occurring within the last 10 years: No If all of the above answers are "NO", then may proceed with Cephalosporin use.    Sulfa Antibiotics Anaphylaxis and Swelling   Ace Inhibitors Cough   Dexlansoprazole Other (See Comments)    Syncope    Oxycodone Nausea Only    Patient Measurements: Height: 4\' 11"  (149.9 cm) Weight: 83.9 kg (185 lb) IBW/kg (Calculated) : 43.2 Heparin Dosing Weight: 63  Vital Signs: Temp: 98.4 F (36.9 C) (12/21 1432) Temp Source: Oral (12/21 1432) BP: 102/56 (12/21 1432) Pulse Rate: 81 (12/21 1432)  Labs: Recent Labs    09/21/22 0808 09/22/22 0732 09/23/22 0546  HGB 8.0* 8.1* 7.3*  HCT 26.4* 28.3* 25.0*  PLT 295 320 213  CREATININE 1.26* 1.24* 1.21*    Estimated Creatinine Clearance: 34.3 mL/min (A) (by C-G formula based on SCr of 1.21 mg/dL (H)).   Medical History: Past Medical History:  Diagnosis Date   Acute pulmonary embolism with acute cor pulmonale (HCC) 08/10/2019   Arthritis    Dysphagia    GERD (gastroesophageal reflux disease)    History of hiatal hernia    History of kidney stones    x2 ; passed independently   Hyperlipidemia    Hypertension    Nausea    OA (osteoarthritis) of knee 10/31/2017   Sleep apnea     Medications:  Scheduled:   docusate sodium  100 mg Oral BID   pantoprazole  40 mg Oral BID AC   polyethylene glycol  17 g Oral BID   simvastatin  20 mg Oral QHS   sodium chloride (PF)       Infusions:    Assessment: 81 yo with history of large hiatal hernia and intermittent NSAID use was being followed by GI on admission for melena. Now patient with acute PE and noted per CT to have small thrombus burden. To start IV heparin per Md orders with no bolus. Also note patient has been on apixaban previously in 2020 for DVT/PE  Goal of Therapy:  Heparin level 0.3-0.7 units/ml Monitor platelets by anticoagulation protocol: Yes   Plan:  NO IV heparin bolus per Md Start IV heparin at rate 1000 units/hr Check heparin level 8 hours after start of heparin Daily CBC  2021 09/23/2022,4:23 PM

## 2022-09-23 NOTE — Progress Notes (Signed)
I was notified by the radiologist that patient does have pulmonary embolism which appears to be segmental on the CTA chest.  Patient at this time is not exhibiting any symptoms of shortness of breath, tachycardia or hypoxia.  Lower extremity Dopplers is negative for DVT.  I discussed case with pulmonary, Dr. Judeth Horn.  We decided that we will do a heparin challenge over next 48 hours and monitor for any bleeding symptoms.  If she remains stable, we will start p.o. anticoagulatioN but if she develops any sort of bleeding we will get formal pulmonary and hematology consultation.  I appreciate input from pulmonary, Dr. Judeth Horn.  I also discussed this with the patient who agrees with our plan.  Will continue to monitor her in the hospital.   Stephania Fragmin MD [facility]

## 2022-09-24 DIAGNOSIS — K922 Gastrointestinal hemorrhage, unspecified: Secondary | ICD-10-CM | POA: Diagnosis not present

## 2022-09-24 LAB — BASIC METABOLIC PANEL
Anion gap: 6 (ref 5–15)
BUN: 20 mg/dL (ref 8–23)
CO2: 24 mmol/L (ref 22–32)
Calcium: 8.2 mg/dL — ABNORMAL LOW (ref 8.9–10.3)
Chloride: 106 mmol/L (ref 98–111)
Creatinine, Ser: 1.36 mg/dL — ABNORMAL HIGH (ref 0.44–1.00)
GFR, Estimated: 39 mL/min — ABNORMAL LOW (ref >60)
Glucose, Bld: 103 mg/dL — ABNORMAL HIGH (ref 70–99)
Potassium: 3.7 mmol/L (ref 3.5–5.1)
Sodium: 136 mmol/L (ref 135–145)

## 2022-09-24 LAB — TYPE AND SCREEN
ABO/RH(D): B NEG
Antibody Screen: NEGATIVE
Unit division: 0

## 2022-09-24 LAB — HEPARIN LEVEL (UNFRACTIONATED)
Heparin Unfractionated: 0.2 IU/mL — ABNORMAL LOW (ref 0.30–0.70)
Heparin Unfractionated: 0.53 IU/mL (ref 0.30–0.70)
Heparin Unfractionated: 0.41 IU/mL (ref 0.30–0.70)

## 2022-09-24 LAB — CBC
HCT: 28.8 % — ABNORMAL LOW (ref 36.0–46.0)
Hemoglobin: 8.8 g/dL — ABNORMAL LOW (ref 12.0–15.0)
MCH: 28 pg (ref 26.0–34.0)
MCHC: 30.6 g/dL (ref 30.0–36.0)
MCV: 91.7 fL (ref 80.0–100.0)
Platelets: 297 10*3/uL (ref 150–400)
RBC: 3.14 MIL/uL — ABNORMAL LOW (ref 3.87–5.11)
RDW: 15.7 % — ABNORMAL HIGH (ref 11.5–15.5)
WBC: 13.4 10*3/uL — ABNORMAL HIGH (ref 4.0–10.5)
nRBC: 0 % (ref 0.0–0.2)

## 2022-09-24 LAB — MAGNESIUM: Magnesium: 2.1 mg/dL (ref 1.7–2.4)

## 2022-09-24 LAB — BPAM RBC
Blood Product Expiration Date: 202401032359
ISSUE DATE / TIME: 202312211100
Unit Type and Rh: 1700

## 2022-09-24 NOTE — Progress Notes (Signed)
ANTICOAGULATION CONSULT NOTE -  Pharmacy Consult for heparin Indication: acute pulmonary embolus  Allergies  Allergen Reactions   Penicillins Rash and Other (See Comments)    Has patient had a PCN reaction causing immediate rash, facial/tongue/throat swelling, SOB or lightheadedness with hypotension: No Has patient had a PCN reaction causing severe rash involving mucus membranes or skin necrosis: No Has patient had a PCN reaction that required hospitalization: No Has patient had a PCN reaction occurring within the last 10 years: No If all of the above answers are "NO", then may proceed with Cephalosporin use.    Sulfa Antibiotics Anaphylaxis and Swelling   Ace Inhibitors Cough   Dexlansoprazole Other (See Comments)    Syncope    Oxycodone Nausea Only    Patient Measurements: Height: 4\' 11"  (149.9 cm) Weight: 83.9 kg (185 lb) IBW/kg (Calculated) : 43.2 Heparin Dosing Weight: 63 kg  Vital Signs: Temp: 97.7 F (36.5 C) (12/22 0535) Temp Source: Oral (12/22 0535) BP: 118/69 (12/22 0535) Pulse Rate: 67 (12/22 0535)  Labs: Recent Labs    09/22/22 0732 09/23/22 0546 09/23/22 1858 09/24/22 0042 09/24/22 0054  HGB 8.1* 7.3* 9.1* 8.8*  --   HCT 28.3* 25.0* 29.5* 28.8*  --   PLT 320 213  --  297  --   HEPARINUNFRC  --   --   --   --  0.20*  CREATININE 1.24* 1.21*  --  1.36*  --     Estimated Creatinine Clearance: 30.5 mL/min (A) (by C-G formula based on SCr of 1.36 mg/dL (H)).  Assessment: Patient is an 81 y.o F with hx DVT/PE in 2020 (treated with Eliquis for 6 months) and recent stroke on 08/19/22 on plavix/asa PTA who presented to the ED on 09/18/22 with c/o black tarry stools and abdominal pain.  EGD on 09/21/22 showed large hiatal hernia, gastritis and Cameron's erosions. GI team suspects bleeding came from Cameron's erosions and recommends to treat with PPI and ok to resume antiplatelet and AC back.  On 12/21, chest CT showed findings consistent with acute RLE PE  (small thrombus burden). LE doppler was negative for DVT. She's currently on heparin drip for acute PE.   Today, 09/24/2022: - heparin level is therapeutic at 0.53 after rate increased to 1150 units/hr early this morning - hgb down slightly to 8.8, plts 15.7   Goal of Therapy:  Heparin level 0.3-0.7 units/ml Monitor platelets by anticoagulation protocol: Yes   Plan:  - continue heparin drip at 1150 units/hr - will recheck another heparin level at 7p to ensure level is still therapeutic before changing to daily monitoring - monitor for s/sx bleeding   Monay Houlton P 09/24/2022,9:00 AM

## 2022-09-24 NOTE — Progress Notes (Signed)
PROGRESS NOTE    Cheryl Goodman  ZOX:[id number] DOB: 1941-01-16 DOA: 09/18/2022 PCP: Danella Penton, MD   Brief Narrative:  81 year old female with past medical history of gastroesophageal reflux disease, hypertension, hyperlipidemia, obstructive sleep apnea and pertinent history of upper GI bleeding secondary to erosive gastritis requiring hospitalization (treated at [facility] 07/2019) followed by submassive bilateral pulmonary emboli and left lower extremity DVT also requiring hospitalization (also during 07/2019 treated with 6 months of Eliquis successfully). Finally, patient also has a recent diagnosis of right corona radiata stroke diagnosed 08/19/2022 as an outpatient via MRI brain in [place] [facility] managed as an outpatient with aspirin and Plavix.  Outpatient she developed melanotic stool therefore admitted to the hospital.  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  GI recommended PPI twice daily and okay to resume antiplatelet.  Previous provider also discussed case with neurology who recommended only resuming aspirin.  Rest of the workup including LDL, A1c, CTA head and neck were unremarkable. Echo showed improved RV func compared to 2020, ef is preserved. CTA showed acute segmental PE.  Aspirin stopped, heparin challenge to see if she has any further bleeding.   Assessment & Plan:  Principal Problem:   Acute upper GI bleed Active Problems:   History of ischemic stroke   AKI (acute kidney injury) (HCC)   UTI due to Klebsiella species   Essential hypertension   Mixed hyperlipidemia   Gastroesophageal reflux disease     Assessment and Plan: * Acute upper GI bleed Patient does have extensive history of previous GI bleed from erosion/H. pylori.  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  Cleared to resume antiplatelets per GI.  Hemoglobin remained stable at this time.  Started  on PPI twice daily, hemoglobin stable.  Status post 1 unit PRBC transfusion.  Acute segmental pulmonary embolism - No hemodynamic compromise but in the setting of acute PE but history of bleeding I have discussed case with pulmonary, Dr. Judeth Horn.  Will proceed with heparin challenge for at least 48 hours to ensure hemoglobin remained stable and she does not have any bleeding.  If that becomes the case we will consult pulmonary and hematology officially for further guidance Lower extremity Dopplers is negative for DVT   History of ischemic stroke This was recently diagnosed on outpatient MRI on 11/16 and started on aspirin and Plavix.  Unfortunately she developed GI bleed therefore got admitted.  Previous provider discussed case with neurology who recommended resuming only aspirin and completing rest of the stroke workup. LDL 38, A1c 6.2, CTA head and neck did not show any large vessel occlusion besides severe stenosis of the P2 segment of the left.  Would recommend outpatient follow-up with neurology. Echocardiogram-EF of 65%, grade 1 DD with improvement in RV strain.   AKI (acute kidney injury) (HCC) Patient's renal function is now at baseline   Essential hypertension -Currently soft blood pressure.  Continue IV as needed metoprolol and hydralazine.  UTI due to Klebsiella species Urine cultures are on Klebsiella, complete total 3-day course of Cipro  Mixed hyperlipidemia Patient is on already on Zocor   Gastroesophageal reflux disease PPI twice daily      DVT prophylaxis: SCDs Start: 09/18/22 1653 Code Status: Full code Family Communication: Spouse at the side  Status is: Inpatient On Heparin drip.    Subjective: Seen and examined at bedside, sitting up in the recliner.  Does not have any complaints  Examination: Constitutional: Not in acute distress, very pleasant Respiratory: Clear to auscultation bilaterally Cardiovascular: Normal sinus rhythm, no rubs Abdomen:  Nontender nondistended good bowel sounds Musculoskeletal: No edema noted Skin: No rashes seen Neurologic: CN 2-12 grossly intact.  And nonfocal Psychiatric: Normal judgment and insight. Alert and oriented x 3. Normal mood.  Objective: Vitals:   09/23/22 1315 09/23/22 1432 09/23/22 2026 09/24/22 0535  BP: (!) 114/58 (!) 102/56 (!) 126/55 118/69  Pulse: 71 81 78 67  Resp: Temp: 99 F (37.2 C) 98.4 F (36.9 C) 98.2 F (36.8 C) 97.7 F (36.5 C)  TempSrc: Oral Oral Oral Oral  SpO2: 97% 98% 96% 97%  Weight:      Height:        Intake/Output Summary (Last 24 hours) at 09/24/2022 1148 Last data filed at 09/24/2022 0950 Gross per 24 hour  Intake 745.86 ml  Output --  Net 745.86 ml    Filed Weights   09/18/22 1352  Weight: 83.9 kg     Data Reviewed:   CBC: Recent Labs  Lab 09/18/22 1413 09/19/22 0029 09/19/22 0715 09/19/22 1816 09/20/22 1305 09/21/22 0808 09/22/22 0732 09/23/22 0546 09/23/22 1858 09/24/22 0042  WBC 20.9*   < > 16.0*   < > 15.2* 16.5* 16.0* 13.6*  --  13.4*  NEUTROABS 15.9*  --  11.3*  --   --  12.1* 12.1*  --   --   --   HGB 9.8*   < > 8.2*   < > 8.3* 8.0* 8.1* 7.3* 9.1* 8.8*  HCT 31.1*   < > 26.8*   < > 26.8* 26.4* 28.3* 25.0* 29.5* 28.8*  MCV 89.4   < > 89.6   < > 90.8 91.0 95.6 94.7  --  91.7  PLT 379   < > 279   < > 296 295 320 213  --  297   < > = values in this interval not displayed.   Basic Metabolic Panel: Recent Labs  Lab 09/20/22 0749 09/21/22 0808 09/22/22 0732 09/23/22 0546 09/24/22 0042  NA 140 140 139 140 136  K 4.0 4.0 3.8 4.1 3.7  CL 110 108 110 111 106  CO2 GLUCOSE 104* 115* 102* 101* 103*  BUN 33* 31* CREATININE 1.31* 1.26* 1.24* 1.21* 1.36*  CALCIUM 8.6* 8.5* 8.6* 8.1* 8.2*  MG 2.4 2.2 2.2 2.2 2.1   GFR: Estimated Creatinine Clearance: 30.5 mL/min (A) (by C-G formula based on SCr of 1.36 mg/dL (H)). Liver Function Tests: Recent Labs  Lab 09/18/22 1413 09/19/22 0722  09/20/22 0749 09/21/22 0808 09/22/22 0732  AST 16 22 14* 14* 15  ALT ALKPHOS 55 45 49 48 54  BILITOT 0.5 0.8 0.7 0.7 0.7  PROT 6.6 5.6* 5.7* 5.5* 5.6*  ALBUMIN 3.5 3.0* 3.1* 3.0* 3.0*   Recent Labs  Lab 09/18/22 1413  LIPASE 41   No results for input(s): "AMMONIA" in the last 168 hours. Coagulation Profile: Recent Labs  Lab 09/19/22 0722  INR 1.1   Cardiac Enzymes: No results for input(s): "CKTOTAL", "CKMB", "CKMBINDEX", "TROPONINI" in the last 168 hours. BNP (last 3 results) No results for input(s): "PROBNP" in the last 8760 hours. HbA1C: Recent Labs    09/21/22 1644  HGBA1C 6.2*   CBG: No results for input(s): "GLUCAP" in the last 168 hours. Lipid Profile: Recent Labs    09/21/22 1644  CHOL  112  HDL 39*  LDLCALC 38  TRIG 875*  CHOLHDL 2.9   Thyroid Function Tests: No results for input(s): "TSH", "T4TOTAL", "FREET4", "T3FREE", "THYROIDAB" in the last 72 hours. Anemia Panel: No results for input(s): "VITAMINB12", "FOLATE", "FERRITIN", "TIBC", "IRON", "RETICCTPCT" in the last 72 hours.  Sepsis Labs: No results for input(s): "PROCALCITON", "LATICACIDVEN" in the last 168 hours.  Recent Results (from the past 240 hour(s))  Resp panel by RT-PCR (RSV, Flu A&B, Covid) Anterior Nasal Swab     Status: None   Collection Time: 09/18/22  2:00 PM   Specimen: Anterior Nasal Swab  Result Value Ref Range Status   SARS Coronavirus 2 by RT PCR NEGATIVE NEGATIVE Final    Comment: (NOTE) SARS-CoV-2 target nucleic acids are NOT DETECTED.  The SARS-CoV-2 RNA is generally detectable in upper respiratory specimens during the acute phase of infection. The lowest concentration of SARS-CoV-2 viral copies this assay can detect is 138 copies/mL. A negative result does not preclude SARS-Cov-2 infection and should not be used as the sole basis for treatment or other patient management decisions. A negative result may occur with  improper specimen  collection/handling, submission of specimen other than nasopharyngeal swab, presence of viral mutation(s) within the areas targeted by this assay, and inadequate number of viral copies(<138 copies/mL). A negative result must be combined with clinical observations, patient history, and epidemiological information. The expected result is Negative.  Fact Sheet for Patients:  BloggerCourse.com  Fact Sheet for Healthcare Providers:  SeriousBroker.it  This test is no t yet approved or cleared by the [place] FDA and  has been authorized for detection and/or diagnosis of SARS-CoV-2 by FDA under an Emergency Use Authorization (EUA). This EUA will remain  in effect (meaning this test can be used) for the duration of the COVID-19 declaration under Section 564(b)(1) of the Act, 21 U.S.C.section 360bbb-3(b)(1), unless the authorization is terminated  or revoked sooner.       Influenza A by PCR NEGATIVE NEGATIVE Final   Influenza B by PCR NEGATIVE NEGATIVE Final    Comment: (NOTE) The Xpert Xpress SARS-CoV-2/FLU/RSV plus assay is intended as an aid in the diagnosis of influenza from Nasopharyngeal swab specimens and should not be used as a sole basis for treatment. Nasal washings and aspirates are unacceptable for Xpert Xpress SARS-CoV-2/FLU/RSV testing.  Fact Sheet for Patients: BloggerCourse.com  Fact Sheet for Healthcare Providers: SeriousBroker.it  This test is not yet approved or cleared by the [place] FDA and has been authorized for detection and/or diagnosis of SARS-CoV-2 by FDA under an Emergency Use Authorization (EUA). This EUA will remain in effect (meaning this test can be used) for the duration of the COVID-19 declaration under Section 564(b)(1) of the Act, 21 U.S.C. section 360bbb-3(b)(1), unless the authorization is terminated or revoked.     Resp Syncytial  Virus by PCR NEGATIVE NEGATIVE Final    Comment: (NOTE) Fact Sheet for Patients: BloggerCourse.com  Fact Sheet for Healthcare Providers: SeriousBroker.it  This test is not yet approved or cleared by the [place] FDA and has been authorized for detection and/or diagnosis of SARS-CoV-2 by FDA under an Emergency Use Authorization (EUA). This EUA will remain in effect (meaning this test can be used) for the duration of the COVID-19 declaration under Section 564(b)(1) of the Act, 21 U.S.C. section 360bbb-3(b)(1), unless the authorization is terminated or revoked.  Performed at [facility], [address], [place], [place] [address]   Urine Culture     Status:  Abnormal   Collection Time: 09/18/22  5:28 PM   Specimen: Urine, Clean Catch  Result Value Ref Range Status   Specimen Description   Final    URINE, CLEAN CATCH Performed at [facility], [address] [place], [place] [address]    Special Requests   Final    NONE Performed at [facility], [address] [place], [place] [address]    Culture >=100,000 COLONIES/mL KLEBSIELLA PNEUMONIAE (A)  Final   Report Status 09/20/2022 FINAL  Final   Organism ID, Bacteria KLEBSIELLA PNEUMONIAE (A)  Final      Susceptibility   Klebsiella pneumoniae - MIC*    AMPICILLIN RESISTANT Resistant     CEFAZOLIN <=4 SENSITIVE Sensitive     CEFEPIME <=0.12 SENSITIVE Sensitive     CEFTRIAXONE <=0.25 SENSITIVE Sensitive     CIPROFLOXACIN <=0.25 SENSITIVE Sensitive     GENTAMICIN <=1 SENSITIVE Sensitive     IMIPENEM <=0.25 SENSITIVE Sensitive     NITROFURANTOIN <=16 SENSITIVE Sensitive     TRIMETH/SULFA <=20 SENSITIVE Sensitive     AMPICILLIN/SULBACTAM <=2 SENSITIVE Sensitive     PIP/TAZO <=4 SENSITIVE Sensitive     * >=100,000 COLONIES/mL KLEBSIELLA PNEUMONIAE         Radiology Studies: CT Angio Chest Pulmonary Embolism (PE)  W or WO Contrast  Result Date: 09/23/2022 CLINICAL DATA:  High clinical suspicion for PE, abnormal echocardiogram, upper GI bleed EXAM: CT ANGIOGRAPHY CHEST WITH CONTRAST TECHNIQUE: Multidetector CT imaging of the chest was performed using the standard protocol during bolus administration of intravenous contrast. Multiplanar CT image reconstructions and MIPs were obtained to evaluate the vascular anatomy. RADIATION DOSE REDUCTION: This exam was performed according to the departmental dose-optimization program which includes automated exposure control, adjustment of the mA and/or kV according to patient size and/or use of iterative reconstruction technique. CONTRAST:  70mL OMNIPAQUE IOHEXOL 350 MG/ML SOLN COMPARISON:  Previous studies including the CT done on 02-11-2021 FINDINGS: Cardiovascular: There are intraluminal filling defects in few segmental and subsegmental branches in right lower lung fields. This finding was not seen in the previous study. There is small thrombus burden. RV LV ratio is 1.3. This may suggest chronic right heart strain. There are scattered coronary artery calcifications. There is homogeneous enhancement in thoracic aorta. Minimal pericardial effusion is seen. Mediastinum/Nodes: No significant lymphadenopathy is seen. There is large fixed hiatal hernia in the posterior mediastinum. Lungs/Pleura: There are linear densities in the left lower lung fields suggesting scarring or subsegmental atelectasis. Left hemidiaphragm is elevated. There is no pleural effusion or pneumothorax. Upper Abdomen: Surgical clips are seen in gallbladder fossa. 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 Large fixed hiatal hernia is seen. Musculoskeletal: Compression fracture of body of T12 vertebra has not changed. Review of the MIP images confirms the above findings. IMPRESSION: There are filling defects seen segmental and subsegmental pulmonary artery branches in right lower lung field suggesting  acute PE with small thrombus burden. RV LV ratio is 1.3. This may be due to chronic right heart dysfunction. Please correlate with clinical findings. There are scattered coronary artery calcifications. Minimal pericardial effusion. Small patchy infiltrates in left lower lung fields may suggest atelectasis/pneumonia. There is large fixed hiatal hernia. There is 1.4 cm calcified aneurysm in splenic artery with no significant interval change. Imaging findings were relayed to patient's provider Dr. Nelson Chimes by telephone call. Electronically Signed   By: Ernie Avena M.D.   On: 09/23/2022 16:09   VAS [place]  LOWER EXTREMITY VENOUS (DVT)  Result Date: 09/23/2022  Lower Venous DVT Study Patient Name:  Cheryl Goodman  Date of Exam:   09/23/2022 Medical Rec #: [id number]           Accession #:    [id number] Date of Birth: 04-08-1941           Patient Gender: F Patient Age:   62 years Exam Location:  [facility] Procedure:      VAS [place] LOWER EXTREMITY VENOUS (DVT) Referring Phys: ABIGAIL CHAVEZ --------------------------------------------------------------------------------  Indications: Pain.  Limitations: Poor ultrasound/tissue interface, body habitus and pain intolerance. Comparison Study: Previous exam on 06/21/2022 was negative for DVT Performing Technologist: Ernestene Mention RVT, RDMS  Examination Guidelines: A complete evaluation includes B-mode imaging, spectral Doppler, color Doppler, and power Doppler as needed of all accessible portions of each vessel. Bilateral testing is considered an integral part of a complete examination. Limited examinations for reoccurring indications may be performed as noted. The reflux portion of the exam is performed with the patient in reverse Trendelenburg.  +--------+---------------+---------+-----------+----------+--------------------+ RIGHT   CompressibilityPhasicitySpontaneityPropertiesThrombus Aging        +--------+---------------+---------+-----------+----------+--------------------+ CFV     Full           Yes      Yes                                       +--------+---------------+---------+-----------+----------+--------------------+ SFJ     Full                                                              +--------+---------------+---------+-----------+----------+--------------------+ FV Prox Full           Yes      Yes                                       +--------+---------------+---------+-----------+----------+--------------------+ FV Mid  Full           Yes      Yes                                       +--------+---------------+---------+-----------+----------+--------------------+ FV      Full           Yes      Yes                                       Distal                                                                    +--------+---------------+---------+-----------+----------+--------------------+ PFV                    Yes  Yes                  patent by                                                                 color/doppler        +--------+---------------+---------+-----------+----------+--------------------+ POP     Full           Yes      Yes                                       +--------+---------------+---------+-----------+----------+--------------------+ PTV     Full                                                              +--------+---------------+---------+-----------+----------+--------------------+ PERO    Full                                                              +--------+---------------+---------+-----------+----------+--------------------+ hypoechoic cystic area noted in the thigh overlying the anterior aspect of the distal femur (6.75 x 1.48 x 2.79 cm).    Summary: RIGHT: - No evidence of common femoral vein obstruction.  LEFT: - There is no evidence of deep vein thrombosis in the  lower extremity.  - No cystic structure found in the popliteal fossa. - Hypoechoic cystic area noted in the thigh overlying the anterior aspect of the distal femur (6.75 x 1.48 x 2.79 cm).  *See table(s) above for measurements and observations. Electronically signed by Lemar LivingsBrandon Cain MD on 09/23/2022 at 1:46:15 PM.    Final    ECHOCARDIOGRAM COMPLETE BUBBLE STUDY  Result Date: 09/22/2022    ECHOCARDIOGRAM REPORT   Patient Name:   Cheryl Goodman Date of Exam: 09/22/2022 Medical Rec #:  [id number]          Height:       59.0 in Accession #:    [id number]         Weight:       185.0 lb Date of Birth:  [birth year]          BSA:          1.784 m Patient Age:    81 years           BP:           125/65 mmHg Patient Gender: F                  HR:           84 bpm. Exam Location:  Inpatient Procedure: 2D Echo, Cardiac Doppler, Color Doppler and Saline Contrast Bubble            Study Indications:    Stroke 434.91 / I63.9  History:  Patient has prior history of Echocardiogram examinations, most                 recent 08/02/2019. Risk Factors:Hypertension, Dyslipidemia and                 Sleep Apnea.  Sonographer:    Leta Jungling RDCS Referring Phys: [id number] Deno Lunger [facility] IMPRESSIONS  1. Left ventricular ejection fraction, by estimation, is 60 to 65%. The left ventricle has normal function. The left ventricle has no regional wall motion abnormalities. Left ventricular diastolic parameters are consistent with Grade I diastolic dysfunction (impaired relaxation).  2. Right ventricular systolic function is low normal. The right ventricular size is normal.  3. The mitral valve is grossly normal. No evidence of mitral valve regurgitation. No evidence of mitral stenosis.  4. The aortic valve is grossly normal. Aortic valve regurgitation is not visualized. No aortic stenosis is present.  5. Cannot exclude a small PFO by color doppler, however, agitated saline contrast bubble study was negative for right to left  shunt. Comparison(s): Changes from prior study are noted. 08/02/2019: LVEF 60-65%, severe RV dysfunction (Right heart strain). Conclusion(s)/Recommendation(s): No intracardiac source of embolism detected on this transthoracic study. Consider a transesophageal echocardiogram to exclude cardiac source of embolism if clinically indicated. FINDINGS  Left Ventricle: Left ventricular ejection fraction, by estimation, is 60 to 65%. The left ventricle has normal function. The left ventricle has no regional wall motion abnormalities. The left ventricular internal cavity size was normal in size. There is  no left ventricular hypertrophy. Left ventricular diastolic parameters are consistent with Grade I diastolic dysfunction (impaired relaxation). Right Ventricle: The right ventricular size is normal. No increase in right ventricular wall thickness. Right ventricular systolic function is low normal. Left Atrium: Left atrial size was normal in size. Right Atrium: Right atrial size was normal in size. Pericardium: There is no evidence of pericardial effusion. Presence of epicardial fat layer. Mitral Valve: The mitral valve is grossly normal. No evidence of mitral valve regurgitation. No evidence of mitral valve stenosis. Tricuspid Valve: The tricuspid valve is grossly normal. Tricuspid valve regurgitation is not demonstrated. No evidence of tricuspid stenosis. Aortic Valve: The aortic valve is grossly normal. Aortic valve regurgitation is not visualized. No aortic stenosis is present. Pulmonic Valve: The pulmonic valve was grossly normal. Pulmonic valve regurgitation is trivial. No evidence of pulmonic stenosis. Aorta: The aortic root and ascending aorta are structurally normal, with no evidence of dilitation. IAS/Shunts: Cannot exclude a small PFO. Agitated saline contrast was given intravenously to evaluate for intracardiac shunting. Agitated saline contrast bubble study was negative, with no evidence of any interatrial shunt.   LEFT VENTRICLE PLAX 2D LVIDd:         3.90 cm   Diastology LVIDs:         2.00 cm   LV e' medial:    7.18 cm/s LV PW:         0.90 cm   LV E/e' medial:  13.1 LV IVS:        0.90 cm   LV e' lateral:   5.98 cm/s LVOT diam:     1.70 cm   LV E/e' lateral: 15.8 LV SV:         46 LV SV Index:   26 LVOT Area:     2.27 cm  RIGHT VENTRICLE RV S prime:     15.70 cm/s LEFT ATRIUM             Index  RIGHT ATRIUM           Index LA diam:        2.90 cm 1.63 cm/m   RA Area:     11.00 cm LA Vol (A2C):   60.7 ml 34.02 ml/m  RA Volume:   18.80 ml  10.54 ml/m LA Vol (A4C):   54.0 ml 30.26 ml/m LA Biplane Vol: 57.5 ml 32.23 ml/m  AORTIC VALVE LVOT Vmax:   120.00 cm/s LVOT Vmean:  64.500 cm/s LVOT VTI:    0.204 m  AORTA Ao Root diam: 3.30 cm Ao Asc diam:  3.20 cm MITRAL VALVE MV Area (PHT): 4.41 cm    SHUNTS MV Decel Time: 172 msec    Systemic VTI:  0.20 m MV E velocity: 94.30 cm/s  Systemic Diam: 1.70 cm MV A velocity: 81.40 cm/s MV E/A ratio:  1.16 Zoila Shutter MD Electronically signed by Zoila Shutter MD Signature Date/Time: 09/22/2022/2:36:53 PM    Final         Scheduled Meds:  docusate sodium  100 mg Oral BID   pantoprazole  40 mg Oral BID AC   simvastatin  20 mg Oral QHS   Continuous Infusions:  heparin 1,150 Units/hr (09/24/22 0303)     LOS: 4 days   Time spent= 35 mins    Ozzy Bohlken Joline Maxcy, MD [facility]  If 7PM-7AM, please contact night-coverage  09/24/2022, 11:48 AM

## 2022-09-24 NOTE — Progress Notes (Signed)
ANTICOAGULATION CONSULT NOTE - Follow Up Consult  Pharmacy Consult for IV heparin  Indication: pulmonary embolus  Allergies  Allergen Reactions   Penicillins Rash and Other (See Comments)    Has patient had a PCN reaction causing immediate rash, facial/tongue/throat swelling, SOB or lightheadedness with hypotension: No Has patient had a PCN reaction causing severe rash involving mucus membranes or skin necrosis: No Has patient had a PCN reaction that required hospitalization: No Has patient had a PCN reaction occurring within the last 10 years: No If all of the above answers are "NO", then may proceed with Cephalosporin use.    Sulfa Antibiotics Anaphylaxis and Swelling   Ace Inhibitors Cough   Dexlansoprazole Other (See Comments)    Syncope    Oxycodone Nausea Only    Patient Measurements: Height: 4\' 11"  (149.9 cm) Weight: 83.9 kg (185 lb) IBW/kg (Calculated) : 43.2 Heparin Dosing Weight: 63  Vital Signs: Temp: 98.2 F (36.8 C) (12/21 2026) Temp Source: Oral (12/21 2026) BP: 126/55 (12/21 2026) Pulse Rate: 78 (12/21 2026)  Labs: Recent Labs    09/22/22 0732 09/23/22 0546 09/23/22 1858 09/24/22 0042 09/24/22 0054  HGB 8.1* 7.3* 9.1* 8.8*  --   HCT 28.3* 25.0* 29.5* 28.8*  --   PLT 320 213  --  297  --   HEPARINUNFRC  --   --   --   --  0.20*  CREATININE 1.24* 1.21*  --  1.36*  --      Estimated Creatinine Clearance: 30.5 mL/min (A) (by C-G formula based on SCr of 1.36 mg/dL (H)).   Medical History: Past Medical History:  Diagnosis Date   Acute pulmonary embolism with acute cor pulmonale (HCC) 08/10/2019   Arthritis    Dysphagia    GERD (gastroesophageal reflux disease)    History of hiatal hernia    History of kidney stones    x2 ; passed independently   Hyperlipidemia    Hypertension    Nausea    OA (osteoarthritis) of knee 10/31/2017   Sleep apnea     Medications:  Scheduled:   docusate sodium  100 mg Oral BID   pantoprazole  40 mg Oral BID  AC   simvastatin  20 mg Oral QHS   Infusions:   heparin 1,000 Units/hr (09/24/22 0239)    Assessment: 81 yo with history of large hiatal hernia and intermittent NSAID use was being followed by GI on admission for melena. Now patient with acute PE and noted per CT to have small thrombus burden. To start IV heparin per Md orders with no bolus. Also note patient has been on apixaban previously in 2020 for DVT/PE  09/24/22 Heparin level = 0.2 (subtherapeutic) with heparin gtt @ 1000 units/hr Hgb 8.8, Pltc wnl Patient with known melena on admission. No additional bleeding issues per RN since heparin started RN confirmed no interruption in therapy and no line issues  Goal of Therapy:  Heparin level 0.3-0.7 units/ml Monitor platelets by anticoagulation protocol: Yes   Plan:  NO IV heparin bolus per Md Increase IV heparin rate to 1150 units/hr Check heparin level 8 hours after heparin rate increase Daily CBC  Ramya Vanbergen, 09/26/22, PharmD 09/24/2022,2:41 AM

## 2022-09-24 NOTE — Progress Notes (Signed)
Pt heparin lab level = 0.2, notified by pharmacy to increase current heparin infusion from 10.0 ml/hr to 11.5 ml/hr, pt observed for signs of bleeding and none noted, PIV assessed and is patent and intact. Pt educated on change and verbalized an understanding. Will monitor closely

## 2022-09-24 NOTE — Care Management Important Message (Signed)
Important Message  Patient Details IM Letter given. Name: Cheryl Goodman MRN: [id number] Date of Birth: [birth year]   Medicare Important Message Given:  Yes     Caren Macadam 09/24/2022, 1:27 PM

## 2022-09-24 NOTE — Progress Notes (Signed)
ANTICOAGULATION CONSULT NOTE -  Pharmacy Consult for heparin Indication: acute pulmonary embolus  Allergies  Allergen Reactions   Penicillins Rash and Other (See Comments)    Has patient had a PCN reaction causing immediate rash, facial/tongue/throat swelling, SOB or lightheadedness with hypotension: No Has patient had a PCN reaction causing severe rash involving mucus membranes or skin necrosis: No Has patient had a PCN reaction that required hospitalization: No Has patient had a PCN reaction occurring within the last 10 years: No If all of the above answers are "NO", then may proceed with Cephalosporin use.    Sulfa Antibiotics Anaphylaxis and Swelling   Ace Inhibitors Cough   Dexlansoprazole Other (See Comments)    Syncope    Oxycodone Nausea Only    Patient Measurements: Height: 4\' 11"  (149.9 cm) Weight: 83.9 kg (185 lb) IBW/kg (Calculated) : 43.2 Heparin Dosing Weight: 63 kg  Vital Signs: Temp: 98.7 F (37.1 C) (12/22 2019) Temp Source: Oral (12/22 2019) BP: 115/57 (12/22 2019) Pulse Rate: 78 (12/22 2019)  Labs: Recent Labs    09/22/22 0732 09/23/22 0546 09/23/22 1858 09/24/22 0042 09/24/22 0054 09/24/22 1052 09/24/22 1836  HGB 8.1* 7.3* 9.1* 8.8*  --   --   --   HCT 28.3* 25.0* 29.5* 28.8*  --   --   --   PLT 320 213  --  297  --   --   --   HEPARINUNFRC  --   --   --   --  0.20* 0.53 0.41  CREATININE 1.24* 1.21*  --  1.36*  --   --   --      Estimated Creatinine Clearance: 30.5 mL/min (A) (by C-G formula based on SCr of 1.36 mg/dL (H)).  Assessment: Patient is an 81 y.o F with hx DVT/PE in 2020 (treated with Eliquis for 6 months) and recent stroke on 08/19/22 on plavix/asa PTA who presented to the ED on 09/18/22 with c/o black tarry stools and abdominal pain.  EGD on 09/21/22 showed large hiatal hernia, gastritis and Cameron's erosions. GI team suspects bleeding came from Cameron's erosions and recommends to treat with PPI and ok to resume antiplatelet  and AC back.  On 12/21, chest CT showed findings consistent with acute RLE PE (small thrombus burden). LE doppler was negative for DVT. She's currently on heparin drip for acute PE.   Today, 09/24/2022: (PM) - heparin level remains therapeutic at 0.41 on 1150 units/hr early this morning   Goal of Therapy:  Heparin level 0.3-0.7 units/ml Monitor platelets by anticoagulation protocol: Yes   Plan:  - continue heparin drip at 1150 units/hr - daily heparin level and CBC - monitor for s/sx bleeding   Simar Pothier A 09/24/2022,9:27 PM

## 2022-09-25 DIAGNOSIS — K922 Gastrointestinal hemorrhage, unspecified: Secondary | ICD-10-CM | POA: Diagnosis not present

## 2022-09-25 LAB — MAGNESIUM: Magnesium: 2.5 mg/dL — ABNORMAL HIGH (ref 1.7–2.4)

## 2022-09-25 LAB — BASIC METABOLIC PANEL
Anion gap: 6 (ref 5–15)
BUN: 15 mg/dL (ref 8–23)
CO2: 25 mmol/L (ref 22–32)
Calcium: 8.6 mg/dL — ABNORMAL LOW (ref 8.9–10.3)
Chloride: 111 mmol/L (ref 98–111)
Creatinine, Ser: 1.16 mg/dL — ABNORMAL HIGH (ref 0.44–1.00)
GFR, Estimated: 47 mL/min — ABNORMAL LOW (ref >60)
Glucose, Bld: 123 mg/dL — ABNORMAL HIGH (ref 70–99)
Potassium: 3.7 mmol/L (ref 3.5–5.1)
Sodium: 142 mmol/L (ref 135–145)

## 2022-09-25 LAB — CBC
HCT: 30.6 % — ABNORMAL LOW (ref 36.0–46.0)
Hemoglobin: 9.4 g/dL — ABNORMAL LOW (ref 12.0–15.0)
MCH: 28.2 pg (ref 26.0–34.0)
MCHC: 30.7 g/dL (ref 30.0–36.0)
MCV: 91.9 fL (ref 80.0–100.0)
Platelets: 308 10*3/uL (ref 150–400)
RBC: 3.33 MIL/uL — ABNORMAL LOW (ref 3.87–5.11)
RDW: 15.9 % — ABNORMAL HIGH (ref 11.5–15.5)
WBC: 8.8 10*3/uL (ref 4.0–10.5)
nRBC: 0 % (ref 0.0–0.2)

## 2022-09-25 LAB — HEPARIN LEVEL (UNFRACTIONATED): Heparin Unfractionated: 0.64 IU/mL (ref 0.30–0.70)

## 2022-09-25 MED ORDER — DIPHENHYDRAMINE-ZINC ACETATE 2-0.1 % EX CREA: TOPICAL_CREAM | Freq: Every day | CUTANEOUS | Status: DC | PRN

## 2022-09-25 MED ORDER — MELATONIN 5 MG PO TABS
5.0000 mg | ORAL_TABLET | Freq: Every evening | ORAL | Status: DC | PRN
  Administered 2022-09-25: 5 mg via ORAL
  Filled 2022-09-25: qty 1

## 2022-09-25 NOTE — Progress Notes (Signed)
PROGRESS NOTE    Cheryl BEAUMAN  [id number] DOB: 07/01/1941 DOA: 09/18/2022 PCP: [name], MD   Brief Narrative:  81 year old female with past medical history of gastroesophageal reflux disease, hypertension, hyperlipidemia, obstructive sleep apnea and pertinent history of upper GI bleeding secondary to erosive gastritis requiring hospitalization (treated at [facility] 07/2019) followed by submassive bilateral pulmonary emboli and left lower extremity DVT also requiring hospitalization (also during 07/2019 treated with 6 months of Eliquis successfully). Finally, patient also has a recent diagnosis of right corona radiata stroke diagnosed 08/19/2022 as an outpatient via MRI brain in [place] [facility] managed as an outpatient with aspirin and Plavix.  Outpatient she developed melanotic stool therefore admitted to the hospital.  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  GI recommended PPI twice daily and okay to resume antiplatelet.  Previous provider also discussed case with neurology who recommended only resuming aspirin.  Rest of the workup including LDL, A1c, CTA head and neck were unremarkable. Echo showed improved RV func compared to 2020, ef is preserved. CTA showed acute segmental PE.  Aspirin stopped, heparin challenge to see if she has any further bleeding.   Assessment & Plan:  Principal Problem:   Acute upper GI bleed Active Problems:   History of ischemic stroke   AKI (acute kidney injury) ([place])   UTI due to Klebsiella species   Essential hypertension   Mixed hyperlipidemia   Gastroesophageal reflux disease     Assessment and Plan: * Acute upper GI bleed Patient does have extensive history of previous GI bleed from erosion/H. pylori.  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  Cleared to resume antiplatelets per GI.  \  Started on PPI twice daily, hemoglobin stable.   Status post 1 unit PRBC transfusion. Hb is now stable.   Acute segmental pulmonary embolism - No hemodynamic compromise but in the setting of acute PE but history of bleeding I have discussed case with pulmonary, Dr. Silas Flood.  On Heparin drip, will change to PO tomorrow.  Lower extremity Dopplers is negative for DVT   History of ischemic stroke This was recently diagnosed on outpatient MRI on 11/16 and started on aspirin and Plavix.  Unfortunately she developed GI bleed therefore got admitted.  Previous provider discussed case with neurology who recommended resuming only aspirin and completing rest of the stroke workup. LDL 38, A1c 6.2, CTA head and neck did not show any large vessel occlusion besides severe stenosis of the P2 segment of the left.  Would recommend outpatient follow-up with neurology. Echocardiogram-EF of 65%, grade 1 DD with improvement in RV strain.   AKI (acute kidney injury) ([place]) Patient's renal function is now at baseline   Essential hypertension -Currently soft blood pressure.  Continue IV as needed metoprolol and hydralazine.  UTI due to Klebsiella species Urine cultures are on Klebsiella, complete total 3-day course of Cipro  Mixed hyperlipidemia Patient is on already on Zocor   Gastroesophageal reflux disease PPI twice daily      DVT prophylaxis: SCDs Start: 09/18/22 1653 Code Status: Full code Family Communication: Spouse at bedside.   Status is: Inpatient On Heparin drip.    Subjective: Seen at bedside, no complaints.   Examination: Constitutional: Not in acute distress Respiratory: Clear to auscultation bilaterally Cardiovascular: Normal sinus rhythm, no rubs Abdomen: Nontender nondistended good bowel sounds Musculoskeletal: No edema noted Skin: No rashes seen Neurologic: CN 2-12 grossly intact.  And  nonfocal Psychiatric: Normal judgment and insight. Alert and oriented x 3. Normal mood.    Objective: Vitals:   09/24/22 0535  09/24/22 1413 09/24/22 2019 09/25/22 0523  BP: 118/69 129/66 (!) 115/57 (!) 111/51  Pulse: 67 86 78 72  Resp: 18 20 18 18   Temp: 97.7 F (36.5 C) 98.9 F (37.2 C) 98.7 F (37.1 C) 98.5 F (36.9 C)  TempSrc: Oral Oral Oral Oral  SpO2: 97% 99% 96% 97%  Weight:      Height:        Intake/Output Summary (Last 24 hours) at 09/25/2022 1202 Last data filed at 09/25/2022 0830 Gross per 24 hour  Intake 518.59 ml  Output --  Net 518.59 ml    Filed Weights   09/18/22 1352  Weight: 83.9 kg     Data Reviewed:   CBC: Recent Labs  Lab 09/18/22 1413 09/19/22 0029 09/19/22 0715 09/19/22 1816 09/21/22 0808 09/22/22 0732 09/23/22 0546 09/23/22 1858 09/24/22 0042 09/25/22 0753  WBC 20.9*   < > 16.0*   < > 16.5* 16.0* 13.6*  --  13.4* 8.8  NEUTROABS 15.9*  --  11.3*  --  12.1* 12.1*  --   --   --   --   HGB 9.8*   < > 8.2*   < > 8.0* 8.1* 7.3* 9.1* 8.8* 9.4*  HCT 31.1*   < > 26.8*   < > 26.4* 28.3* 25.0* 29.5* 28.8* 30.6*  MCV 89.4   < > 89.6   < > 91.0 95.6 94.7  --  91.7 91.9  PLT 379   < > 279   < > 295 320 213  --  297 308   < > = values in this interval not displayed.   Basic Metabolic Panel: Recent Labs  Lab 09/21/22 0808 09/22/22 0732 09/23/22 0546 09/24/22 0042 09/25/22 0753  NA 140 139 140 136 142  K 4.0 3.8 4.1 3.7 3.7  CL 108 110 111 106 111  CO2 25 22 23 24 25   GLUCOSE 115* 102* 101* 103* 123*  BUN 31* 21 20 20 15   CREATININE 1.26* 1.24* 1.21* 1.36* 1.16*  CALCIUM 8.5* 8.6* 8.1* 8.2* 8.6*  MG 2.2 2.2 2.2 2.1 2.5*   GFR: Estimated Creatinine Clearance: 35.7 mL/min (A) (by C-G formula based on SCr of 1.16 mg/dL (H)). Liver Function Tests: Recent Labs  Lab 09/18/22 1413 09/19/22 0722 09/20/22 0749 09/21/22 0808 09/22/22 0732  AST 16 22 14* 14* 15  ALT 17 12 14 15 14   ALKPHOS 55 45 49 48 54  BILITOT 0.5 0.8 0.7 0.7 0.7  PROT 6.6 5.6* 5.7* 5.5* 5.6*  ALBUMIN 3.5 3.0* 3.1* 3.0* 3.0*   Recent Labs  Lab 09/18/22 1413  LIPASE 41   No results  for input(s): "AMMONIA" in the last 168 hours. Coagulation Profile: Recent Labs  Lab 09/19/22 0722  INR 1.1   Cardiac Enzymes: No results for input(s): "CKTOTAL", "CKMB", "CKMBINDEX", "TROPONINI" in the last 168 hours. BNP (last 3 results) No results for input(s): "PROBNP" in the last 8760 hours. HbA1C: No results for input(s): "HGBA1C" in the last 72 hours.  CBG: No results for input(s): "GLUCAP" in the last 168 hours. Lipid Profile: No results for input(s): "CHOL", "HDL", "LDLCALC", "TRIG", "CHOLHDL", "LDLDIRECT" in the last 72 hours.  Thyroid Function Tests: No results for input(s): "TSH", "T4TOTAL", "FREET4", "T3FREE", "THYROIDAB" in the last 72 hours. Anemia Panel: No results for input(s): "VITAMINB12", "FOLATE", "FERRITIN", "TIBC", "IRON", "RETICCTPCT" in the last 72  hours.  Sepsis Labs: No results for input(s): "PROCALCITON", "LATICACIDVEN" in the last 168 hours.  Recent Results (from the past 240 hour(s))  Resp panel by RT-PCR (RSV, Flu A&B, Covid) Anterior Nasal Swab     Status: None   Collection Time: 09/18/22  2:00 PM   Specimen: Anterior Nasal Swab  Result Value Ref Range Status   SARS Coronavirus 2 by RT PCR NEGATIVE NEGATIVE Final    Comment: (NOTE) SARS-CoV-2 target nucleic acids are NOT DETECTED.  The SARS-CoV-2 RNA is generally detectable in upper respiratory specimens during the acute phase of infection. The lowest concentration of SARS-CoV-2 viral copies this assay can detect is 138 copies/mL. A negative result does not preclude SARS-Cov-2 infection and should not be used as the sole basis for treatment or other patient management decisions. A negative result may occur with  improper specimen collection/handling, submission of specimen other than nasopharyngeal swab, presence of viral mutation(s) within the areas targeted by this assay, and inadequate number of viral copies(<138 copies/mL). A negative result must be combined with clinical observations,  patient history, and epidemiological information. The expected result is Negative.  Fact Sheet for Patients:  BloggerCourse.com  Fact Sheet for Healthcare Providers:  SeriousBroker.it  This test is no t yet approved or cleared by the [place] FDA and  has been authorized for detection and/or diagnosis of SARS-CoV-2 by FDA under an Emergency Use Authorization (EUA). This EUA will remain  in effect (meaning this test can be used) for the duration of the COVID-19 declaration under Section 564(b)(1) of the Act, 21 U.S.C.section 360bbb-3(b)(1), unless the authorization is terminated  or revoked sooner.       Influenza A by PCR NEGATIVE NEGATIVE Final   Influenza B by PCR NEGATIVE NEGATIVE Final    Comment: (NOTE) The Xpert Xpress SARS-CoV-2/FLU/RSV plus assay is intended as an aid in the diagnosis of influenza from Nasopharyngeal swab specimens and should not be used as a sole basis for treatment. Nasal washings and aspirates are unacceptable for Xpert Xpress SARS-CoV-2/FLU/RSV testing.  Fact Sheet for Patients: BloggerCourse.com  Fact Sheet for Healthcare Providers: SeriousBroker.it  This test is not yet approved or cleared by the [place] FDA and has been authorized for detection and/or diagnosis of SARS-CoV-2 by FDA under an Emergency Use Authorization (EUA). This EUA will remain in effect (meaning this test can be used) for the duration of the COVID-19 declaration under Section 564(b)(1) of the Act, 21 U.S.C. section 360bbb-3(b)(1), unless the authorization is terminated or revoked.     Resp Syncytial Virus by PCR NEGATIVE NEGATIVE Final    Comment: (NOTE) Fact Sheet for Patients: BloggerCourse.com  Fact Sheet for Healthcare Providers: SeriousBroker.it  This test is not yet approved or cleared by the [place] FDA and has been authorized for detection and/or diagnosis of SARS-CoV-2 by FDA under an Emergency Use Authorization (EUA). This EUA will remain in effect (meaning this test can be used) for the duration of the COVID-19 declaration under Section 564(b)(1) of the Act, 21 U.S.C. section 360bbb-3(b)(1), unless the authorization is terminated or revoked.  Performed at [facility], [address], [place], [place] [address]   Urine Culture     Status: Abnormal   Collection Time: 09/18/22  5:28 PM   Specimen: Urine, Clean Catch  Result Value Ref Range Status   Specimen Description   Final    URINE, CLEAN CATCH Performed at [facility], [address], [place], [place] [address]  Special Requests   Final    NONE Performed at [facility], [place] [address], [place], [place] [address]    Culture >=100,000 COLONIES/mL KLEBSIELLA PNEUMONIAE (A)  Final   Report Status 09/20/2022 FINAL  Final   Organism ID, Bacteria KLEBSIELLA PNEUMONIAE (A)  Final      Susceptibility   Klebsiella pneumoniae - MIC*    AMPICILLIN RESISTANT Resistant     CEFAZOLIN <=4 SENSITIVE Sensitive     CEFEPIME <=0.12 SENSITIVE Sensitive     CEFTRIAXONE <=0.25 SENSITIVE Sensitive     CIPROFLOXACIN <=0.25 SENSITIVE Sensitive     GENTAMICIN <=1 SENSITIVE Sensitive     IMIPENEM <=0.25 SENSITIVE Sensitive     NITROFURANTOIN <=16 SENSITIVE Sensitive     TRIMETH/SULFA <=20 SENSITIVE Sensitive     AMPICILLIN/SULBACTAM <=2 SENSITIVE Sensitive     PIP/TAZO <=4 SENSITIVE Sensitive     * >=100,000 COLONIES/mL KLEBSIELLA PNEUMONIAE         Radiology Studies: CT Angio Chest Pulmonary Embolism (PE) W or WO Contrast  Result Date: 09/23/2022 CLINICAL DATA:  High clinical suspicion for PE, abnormal echocardiogram, upper GI bleed EXAM: CT ANGIOGRAPHY CHEST WITH CONTRAST TECHNIQUE: Multidetector CT imaging of the chest was performed using the standard protocol  during bolus administration of intravenous contrast. Multiplanar CT image reconstructions and MIPs were obtained to evaluate the vascular anatomy. RADIATION DOSE REDUCTION: This exam was performed according to the departmental dose-optimization program which includes automated exposure control, adjustment of the mA and/or kV according to patient size and/or use of iterative reconstruction technique. CONTRAST:  70mL OMNIPAQUE IOHEXOL 350 MG/ML SOLN COMPARISON:  Previous studies including the CT done on 02-23-2021 FINDINGS: Cardiovascular: There are intraluminal filling defects in few segmental and subsegmental branches in right lower lung fields. This finding was not seen in the previous study. There is small thrombus burden. RV LV ratio is 1.3. This may suggest chronic right heart strain. There are scattered coronary artery calcifications. There is homogeneous enhancement in thoracic aorta. Minimal pericardial effusion is seen. Mediastinum/Nodes: No significant lymphadenopathy is seen. There is large fixed hiatal hernia in the posterior mediastinum. Lungs/Pleura: There are linear densities in the left lower lung fields suggesting scarring or subsegmental atelectasis. Left hemidiaphragm is elevated. There is no pleural effusion or pneumothorax. Upper Abdomen: Surgical clips are seen in gallbladder fossa. 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 Large fixed hiatal hernia is seen. Musculoskeletal: Compression fracture of body of T12 vertebra has not changed. Review of the MIP images confirms the above findings. IMPRESSION: There are filling defects seen segmental and subsegmental pulmonary artery branches in right lower lung field suggesting acute PE with small thrombus burden. RV LV ratio is 1.3. This may be due to chronic right heart dysfunction. Please correlate with clinical findings. There are scattered coronary artery calcifications. Minimal pericardial effusion. Small patchy infiltrates in left  lower lung fields may suggest atelectasis/pneumonia. There is large fixed hiatal hernia. There is 1.4 cm calcified aneurysm in splenic artery with no significant interval change. Imaging findings were relayed to patient's provider Dr. Reesa Chew by telephone call. Electronically Signed   By: Elmer Picker M.D.   On: 09/23/2022 16:09        Scheduled Meds:  docusate sodium  100 mg Oral BID   pantoprazole  40 mg Oral BID AC   simvastatin  20 mg Oral QHS   Continuous Infusions:  heparin 1,150 Units/hr (09/25/22 0319)     LOS: 5 days   Time  spent= 35 mins    Raena Pau Arsenio Loader, MD [facility]  If 7PM-7AM, please contact night-coverage  09/25/2022, 12:02 PM

## 2022-09-25 NOTE — Progress Notes (Signed)
ANTICOAGULATION CONSULT NOTE -  Pharmacy Consult for heparin Indication: acute pulmonary embolus  Allergies  Allergen Reactions   Penicillins Rash and Other (See Comments)    Has patient had a PCN reaction causing immediate rash, facial/tongue/throat swelling, SOB or lightheadedness with hypotension: No Has patient had a PCN reaction causing severe rash involving mucus membranes or skin necrosis: No Has patient had a PCN reaction that required hospitalization: No Has patient had a PCN reaction occurring within the last 10 years: No If all of the above answers are "NO", then may proceed with Cephalosporin use.    Sulfa Antibiotics Anaphylaxis and Swelling   Ace Inhibitors Cough   Dexlansoprazole Other (See Comments)    Syncope    Oxycodone Nausea Only    Patient Measurements: Height: 4\' 11"  (149.9 cm) Weight: 83.9 kg (185 lb) IBW/kg (Calculated) : 43.2 Heparin Dosing Weight: 63 kg  Vital Signs: Temp: 98.5 F (36.9 C) (12/23 0523) Temp Source: Oral (12/23 0523) BP: 111/51 (12/23 0523) Pulse Rate: 72 (12/23 0523)  Labs: Recent Labs    09/23/22 0546 09/23/22 1858 09/24/22 0042 09/24/22 0054 09/24/22 1052 09/24/22 1836 09/25/22 0753  HGB 7.3* 9.1* 8.8*  --   --   --  9.4*  HCT 25.0* 29.5* 28.8*  --   --   --  30.6*  PLT 213  --  297  --   --   --  308  HEPARINUNFRC  --   --   --    < > 0.53 0.41 0.64  CREATININE 1.21*  --  1.36*  --   --   --  1.16*   < > = values in this interval not displayed.     Estimated Creatinine Clearance: 35.7 mL/min (A) (by C-G formula based on SCr of 1.16 mg/dL (H)).  Assessment: Patient is an 81 y.o F with hx DVT/PE in 2020 (treated with Eliquis for 6 months) and recent stroke on 08/19/22 on plavix/asa PTA who presented to the ED on 09/18/22 with c/o black tarry stools and abdominal pain.  EGD on 09/21/22 showed large hiatal hernia, gastritis and Cameron's erosions. GI team suspects bleeding came from Cameron's erosions and recommends  to treat with PPI and ok to resume antiplatelet and AC back.  On 12/21, chest CT showed findings consistent with acute RLE PE (small thrombus burden). LE doppler was negative for DVT. She's currently on heparin drip for acute PE.   Today, 09/25/2022:  - 07:53 heparin level remains therapeutic at 0.64 on 1150 units/hr - No bleeding or heparin related issues per nurse   Goal of Therapy:  Heparin level 0.3-0.7 units/ml Monitor platelets by anticoagulation protocol: Yes   Plan:  - continue heparin drip at 1150 units/hr - daily heparin level and CBC - monitor for s/sx bleeding   Thank you for allowing pharmacy to be a part of this patient's care.  09/27/2022, PharmD, BCPS Clinical Pharmacist [place] Please utilize Amion for appropriate phone number to reach the unit pharmacist [facility]) 09/25/2022 9:09 AM

## 2022-09-26 DIAGNOSIS — K922 Gastrointestinal hemorrhage, unspecified: Secondary | ICD-10-CM | POA: Diagnosis not present

## 2022-09-26 LAB — CBC
HCT: 30.4 % — ABNORMAL LOW (ref 36.0–46.0)
Hemoglobin: 9.1 g/dL — ABNORMAL LOW (ref 12.0–15.0)
MCH: 27.7 pg (ref 26.0–34.0)
MCHC: 29.9 g/dL — ABNORMAL LOW (ref 30.0–36.0)
MCV: 92.4 fL (ref 80.0–100.0)
Platelets: 309 10*3/uL (ref 150–400)
RBC: 3.29 MIL/uL — ABNORMAL LOW (ref 3.87–5.11)
RDW: 15.5 % (ref 11.5–15.5)
WBC: 9.1 10*3/uL (ref 4.0–10.5)
nRBC: 0 % (ref 0.0–0.2)

## 2022-09-26 LAB — BASIC METABOLIC PANEL
Anion gap: 6 (ref 5–15)
BUN: 15 mg/dL (ref 8–23)
CO2: 24 mmol/L (ref 22–32)
Calcium: 8.6 mg/dL — ABNORMAL LOW (ref 8.9–10.3)
Chloride: 111 mmol/L (ref 98–111)
Creatinine, Ser: 1.21 mg/dL — ABNORMAL HIGH (ref 0.44–1.00)
GFR, Estimated: 45 mL/min — ABNORMAL LOW (ref >60)
Glucose, Bld: 104 mg/dL — ABNORMAL HIGH (ref 70–99)
Potassium: 3.6 mmol/L (ref 3.5–5.1)
Sodium: 141 mmol/L (ref 135–145)

## 2022-09-26 LAB — HEPARIN LEVEL (UNFRACTIONATED): Heparin Unfractionated: 0.6 IU/mL (ref 0.30–0.70)

## 2022-09-26 LAB — MAGNESIUM: Magnesium: 2.3 mg/dL (ref 1.7–2.4)

## 2022-09-26 MED ORDER — POTASSIUM CHLORIDE 20 MEQ PO PACK
40.0000 meq | PACK | Freq: Once | ORAL | Status: AC
  Administered 2022-09-26: 40 meq via ORAL
  Filled 2022-09-26: qty 2

## 2022-09-26 MED ORDER — PANTOPRAZOLE SODIUM 40 MG PO TBEC: 40.0000 mg | DELAYED_RELEASE_TABLET | Freq: Two times a day (BID) | ORAL | 1 refills | Status: DC

## 2022-09-26 MED ORDER — LACTULOSE 10 GM/15ML PO SOLN
30.0000 g | Freq: Once | ORAL | Status: DC
  Filled 2022-09-26: qty 45

## 2022-09-26 MED ORDER — APIXABAN 5 MG PO TABS
5.0000 mg | ORAL_TABLET | Freq: Two times a day (BID) | ORAL | Status: DC
  Administered 2022-09-26: 5 mg via ORAL
  Filled 2022-09-26: qty 1

## 2022-09-26 MED ORDER — APIXABAN 5 MG PO TABS: 5.0000 mg | ORAL_TABLET | Freq: Two times a day (BID) | ORAL | 0 refills | Status: DC

## 2022-09-26 MED ORDER — DOCUSATE SODIUM 100 MG PO CAPS: 100.0000 mg | ORAL_CAPSULE | Freq: Two times a day (BID) | ORAL | 0 refills | Status: DC | PRN

## 2022-09-26 NOTE — Progress Notes (Addendum)
ANTICOAGULATION CONSULT NOTE -  Pharmacy Consult for heparin Indication: acute pulmonary embolus  Allergies  Allergen Reactions   Penicillins Rash and Other (See Comments)    Has patient had a PCN reaction causing immediate rash, facial/tongue/throat swelling, SOB or lightheadedness with hypotension: No Has patient had a PCN reaction causing severe rash involving mucus membranes or skin necrosis: No Has patient had a PCN reaction that required hospitalization: No Has patient had a PCN reaction occurring within the last 10 years: No If all of the above answers are "NO", then may proceed with Cephalosporin use.    Sulfa Antibiotics Anaphylaxis and Swelling   Ace Inhibitors Cough   Dexlansoprazole Other (See Comments)    Syncope    Oxycodone Nausea Only    Patient Measurements: Height: 4\' 11"  (149.9 cm) Weight: 83.9 kg (185 lb) IBW/kg (Calculated) : 43.2 Heparin Dosing Weight: 63 kg  Vital Signs: Temp: 98.2 F (36.8 C) (12/24 0501) Temp Source: Oral (12/24 0501) BP: 118/68 (12/24 0501) Pulse Rate: 68 (12/24 0501)  Labs: Recent Labs    09/24/22 0042 09/24/22 0054 09/24/22 1836 09/25/22 0753 09/26/22 0640  HGB 8.8*  --   --  9.4* 9.1*  HCT 28.8*  --   --  30.6* 30.4*  PLT 297  --   --  308 309  HEPARINUNFRC  --    < > 0.41 0.64 0.60  CREATININE 1.36*  --   --  1.16* 1.21*   < > = values in this interval not displayed.     Estimated Creatinine Clearance: 34.3 mL/min (A) (by C-G formula based on SCr of 1.21 mg/dL (H)).  Assessment: Patient is an 81 y.o F with hx DVT/PE in 2020 (treated with Eliquis for 6 months) and recent stroke on 08/19/22 on plavix/asa PTA who presented to the ED on 09/18/22 with c/o black tarry stools and abdominal pain.  EGD on 09/21/22 showed large hiatal hernia, gastritis and Cameron's erosions. GI team suspects bleeding came from Cameron's erosions and recommends to treat with PPI and ok to resume antiplatelet and AC back.  On 12/21, chest CT  showed findings consistent with acute RLE PE (small thrombus burden). LE doppler was negative for DVT. She's currently on heparin drip for acute PE.   Today, 09/26/2022:  - Heparin level remains therapeutic at 0.60 on 1150 units/hr - H/H slightly low but relatively stable.  Pltc WNL. - No bleeding or heparin-related issues per RN   Goal of Therapy:  Heparin level 0.3-0.7 units/ml Monitor platelets by anticoagulation protocol: Yes   Plan:  - Continue heparin drip at 1150 units/hr - Daily heparin level and CBC - Monitor for s/sx bleeding  - Await word on potential transition to oral anticoagulation.   Thank you for allowing pharmacy to be a part of this patient's care.  09/28/2022, PharmD, BCPS 09/26/2022  10:13 AM Please see 09/28/2022 Pharmacy for contact phone numbers  Addendum: Per attending MD, Dr. Unknown Jim, stopping heparin now and transitioning to Eliquis 5 mg BID for discharge.  Confirmed with MD that we are omitting the usual loading regimen of 10 mg BID because of patient's high risk for recurrent GI bleeding.  Patient has history of prior Eliquis treatment. Made visit to provide discount card and patient had already received this from care management.

## 2022-09-26 NOTE — Discharge Instructions (Signed)
Information on my medicine - ELIQUIS (apixaban)  This medication education was reviewed with me or my healthcare representative as part of my discharge preparation.   Why was Eliquis prescribed for you? Eliquis was prescribed for you as a treatment for blood clots.  What do You need to know about Eliquis ? Take your Eliquis TWICE DAILY - one tablet in the morning and one tablet in the evening with or without food.  It would be best to take the doses about the same time each day.  If you have difficulty swallowing the tablet whole please discuss with your pharmacist how to take the medication safely.  Take Eliquis exactly as prescribed by your doctor and DO NOT stop taking Eliquis without talking to the doctor who prescribed the medication.  Stopping may increase your risk of developing a new clot.  Refill your prescription before you run out.  After discharge, you should have regular check-up appointments with your healthcare provider that is prescribing your Eliquis.  In the future your dose may need to be changed if your kidney function or weight changes by a significant amount or as you get older.  What do you do if you miss a dose? If you miss a dose, take it as soon as you remember on the same day and resume taking twice daily.  Do not take more than one dose of ELIQUIS at the same time.  Important Safety Information A possible side effect of Eliquis is bleeding. You should call your healthcare provider right away if you experience any of the following: Bleeding from an injury or your nose that does not stop. Unusual colored urine (red or dark brown) or unusual colored stools (red or black). Unusual bruising for unknown reasons. A serious fall or if you hit your head (even if there is no bleeding).  Some medicines may interact with Eliquis and might increase your risk of bleeding or clotting while on Eliquis. To help avoid this, consult your healthcare provider or pharmacist  prior to using any new prescription or non-prescription medications, including herbals, vitamins, non-steroidal anti-inflammatory drugs (NSAIDs) and supplements.  This website has more information on Eliquis (apixaban): [email or web address].cy.

## 2022-09-26 NOTE — Progress Notes (Signed)
ANTICOAGULATION CONSULT NOTE -  Pharmacy Consult for heparin Indication: acute pulmonary embolus  Allergies  Allergen Reactions   Penicillins Rash and Other (See Comments)    Has patient had a PCN reaction causing immediate rash, facial/tongue/throat swelling, SOB or lightheadedness with hypotension: No Has patient had a PCN reaction causing severe rash involving mucus membranes or skin necrosis: No Has patient had a PCN reaction that required hospitalization: No Has patient had a PCN reaction occurring within the last 10 years: No If all of the above answers are "NO", then may proceed with Cephalosporin use.    Sulfa Antibiotics Anaphylaxis and Swelling   Ace Inhibitors Cough   Dexlansoprazole Other (See Comments)    Syncope    Oxycodone Nausea Only    Patient Measurements: Height: 4\' 11"  (149.9 cm) Weight: 83.9 kg (185 lb) IBW/kg (Calculated) : 43.2 Heparin Dosing Weight: 63 kg  Vital Signs: Temp: 98.2 F (36.8 C) (12/24 0501) Temp Source: Oral (12/24 0501) BP: 118/68 (12/24 0501) Pulse Rate: 68 (12/24 0501)  Labs: Recent Labs    09/24/22 0042 09/24/22 0054 09/24/22 1836 09/25/22 0753 09/26/22 0640  HGB 8.8*  --   --  9.4* 9.1*  HCT 28.8*  --   --  30.6* 30.4*  PLT 297  --   --  308 309  HEPARINUNFRC  --    < > 0.41 0.64 0.60  CREATININE 1.36*  --   --  1.16*  --    < > = values in this interval not displayed.     Estimated Creatinine Clearance: 35.7 mL/min (A) (by C-G formula based on SCr of 1.16 mg/dL (H)).  Assessment: Patient is an 81 y.o F with hx DVT/PE in 2020 (treated with Eliquis for 6 months) and recent stroke on 08/19/22 on plavix/asa PTA who presented to the ED on 09/18/22 with c/o black tarry stools and abdominal pain.  EGD on 09/21/22 showed large hiatal hernia, gastritis and Cameron's erosions. GI team suspects bleeding came from Cameron's erosions and recommends to treat with PPI and ok to resume antiplatelet and AC back.  On 12/21, chest CT  showed findings consistent with acute RLE PE (small thrombus burden). LE doppler was negative for DVT. She's currently on heparin drip for acute PE.   Today, 09/26/2022:  - Heparin level remains therapeutic at 0.60 on 1150 units/hr - H/H slightly low but relatively stable.  Pltc WNL. - No bleeding or heparin-related issues per RN   Goal of Therapy:  Heparin level 0.3-0.7 units/ml Monitor platelets by anticoagulation protocol: Yes   Plan:  - Continue heparin drip at 1150 units/hr - Daily heparin level and CBC - Monitor for s/sx bleeding  - Await word on potential transition to oral anticoagulation.   Thank you for allowing pharmacy to be a part of this patient's care.  09/28/2022, PharmD, BCPS 09/26/2022  7:49 AM Please see 09/28/2022 Pharmacy for contact phone numbers

## 2022-09-26 NOTE — Progress Notes (Signed)
  Transition of Care [facility]) Screening Note   Patient Details  Name: Cheryl Goodman Date of Birth: 12/14/1940   Transition of Care [facility]) CM/SW Contact:    Amada Jupiter, LCSW Phone Number: 09/26/2022, 10:03 AM    Transition of Care Department [facility]) has reviewed patient and no TOC needs have been identified at this time. We will continue to monitor patient advancement through interdisciplinary progression rounds. If new patient transition needs arise, please place a TOC consult.

## 2022-09-26 NOTE — Discharge Summary (Signed)
Physician Discharge Summary  Cheryl Goodman ZOX:[id number] DOB: 1940-10-10 DOA: 09/18/2022  PCP: Danella Penton, MD  Admit date: 09/18/2022 Discharge date: 09/26/2022  Admitted From: Home Disposition:  Home  Recommendations for Outpatient Follow-up:  Follow up with PCP in 1-2 weeks Please obtain BMP/CBC in one week your next doctors visit.  Discontinue aspirin and Plavix.  Now on Eliquis 5 mg twice daily. Protonix 40 mg twice daily before meals for at least 8 weeks.  Follow-up outpatient GI. In the future if bleeding recurs, she would benefit from outpatient follow-up with hematology.   Discharge Condition: Stable CODE STATUS: Full code Diet recommendation: 2 g salt  Brief/Interim Summary: 81 year old female with past medical history of gastroesophageal reflux disease, hypertension, hyperlipidemia, obstructive sleep apnea and pertinent history of upper GI bleeding secondary to erosive gastritis requiring hospitalization (treated at [facility] 07/2019) followed by submassive bilateral pulmonary emboli and left lower extremity DVT also requiring hospitalization (also during 07/2019 treated with 6 months of Eliquis successfully). Finally, patient also has a recent diagnosis of right corona radiata stroke diagnosed 08/19/2022 as an outpatient via MRI brain in [place] [facility] managed as an outpatient with aspirin and Plavix.  Outpatient she developed melanotic stool therefore admitted to the hospital.  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  GI recommended PPI twice daily and okay to resume antiplatelet.  Previous provider also discussed case with neurology who recommended only resuming aspirin.  Rest of the workup including LDL, A1c, CTA head and neck were unremarkable. Echo showed improved RV func compared to 2020, ef is preserved. CTA showed acute segmental PE.  Aspirin stopped, heparin challenge to see if she  has any further bleeding.  Eventually her hemoglobin remained stable and did not show any further evidence of bleeding.  She will be discharged home on Eliquis 5 mg twice daily for her acute pulmonary embolism treatment while closely monitoring for any further bleeding signs and symptoms.  Her hemoglobin currently remained stable.  I have been having extensive discussions with the patient and the husband regarding her hospital and postdischarge medical care.  All the questions have been answered by me.     Assessment & Plan:  Principal Problem:   Acute upper GI bleed Active Problems:   History of ischemic stroke   AKI (acute kidney injury) (HCC)   UTI due to Klebsiella species   Essential hypertension   Mixed hyperlipidemia   Gastroesophageal reflux disease       Assessment and Plan: * Acute upper GI bleed, resolved Patient does have extensive history of previous GI bleed from erosion/H. pylori.  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  Cleared to resume antiplatelets per GI.  \  Started on PPI twice daily, hemoglobin stable.  Status post 1 unit PRBC transfusion. Hb is now stable.  Not having any further dark stools   Acute segmental pulmonary embolism - No hemodynamic compromise but in the setting of acute PE but history of bleeding I have discussed case with pulmonary, Dr. Judeth Horn.  Was on heparin drip, will transition to Eliquis 5 mg twice daily. Lower extremity Dopplers is negative for DVT     History of ischemic stroke This was recently diagnosed on outpatient MRI on 11/16 and started on aspirin and Plavix.  Unfortunately she developed GI bleed therefore got admitted.  Previous provider discussed case with neurology who recommended resuming only aspirin and completing rest of the  stroke workup. LDL 38, A1c 6.2, CTA head and neck did not show any large vessel occlusion besides severe stenosis of the P2 segment of the left.  Would recommend outpatient  follow-up with neurology. Echocardiogram-EF of 65%, grade 1 DD with improvement in RV strain.     AKI (acute kidney injury) (HCC) Resolved     Essential hypertension -Currently soft blood pressure.   UTI due to Klebsiella species Urine cultures are on Klebsiella, complete total 3-day course of Cipro   Mixed hyperlipidemia Patient is on already on Zocor     Gastroesophageal reflux disease PPI twice daily       Body mass index is 37.37 kg/m.       Discharge Diagnoses:  Principal Problem:   Acute upper GI bleed Active Problems:   History of ischemic stroke   AKI (acute kidney injury) (HCC)   UTI due to Klebsiella species   Essential hypertension   Mixed hyperlipidemia   Gastroesophageal reflux disease      Consultations: Gastroenterology Curb sided pulmonary  Subjective: Seen and examined at bedside, no complaints this morning.  Overall feeling very well.  Having brown soft bowel movements, hemoglobin remained stable.  No evidence of bleeding.  Denies any shortness of breath.  Discharge Exam: Vitals:   09/25/22 2032 09/26/22 0501  BP: 138/64 118/68  Pulse: 73 68  Resp: 18 18  Temp: 98.1 F (36.7 C) 98.2 F (36.8 C)  SpO2: 98% 96%   Vitals:   09/24/22 2019 09/25/22 0523 09/25/22 2032 09/26/22 0501  BP: (!) 115/57 (!) 111/51 138/64 118/68  Pulse: 78 72 73 68  Resp: Temp: 98.7 F (37.1 C) 98.5 F (36.9 C) 98.1 F (36.7 C) 98.2 F (36.8 C)  TempSrc: Oral Oral Oral Oral  SpO2: 96% 97% 98% 96%  Weight:      Height:        General: Pt is alert, awake, not in acute distress Cardiovascular: RRR, S1/S2 +, no rubs, no gallops Respiratory: CTA bilaterally, no wheezing, no rhonchi Abdominal: Soft, NT, ND, bowel sounds + Extremities: no edema, no cyanosis  Discharge Instructions   Allergies as of 09/26/2022       Reactions   Penicillins Rash, Other (See Comments)   Has patient had a PCN reaction causing immediate rash,  facial/tongue/throat swelling, SOB or lightheadedness with hypotension: No Has patient had a PCN reaction causing severe rash involving mucus membranes or skin necrosis: No Has patient had a PCN reaction that required hospitalization: No Has patient had a PCN reaction occurring within the last 10 years: No If all of the above answers are "NO", then may proceed with Cephalosporin use.   Sulfa Antibiotics Anaphylaxis, Swelling   Ace Inhibitors Cough   Dexlansoprazole Other (See Comments)   Syncope   Oxycodone Nausea Only        Medication List     STOP taking these medications    aspirin EC 81 MG tablet   clopidogrel 75 MG tablet Commonly known as: PLAVIX   ibuprofen 800 MG tablet Commonly known as: ADVIL   omeprazole 40 MG capsule Commonly known as: PRILOSEC       TAKE these medications    acetaminophen 325 MG tablet Commonly known as: TYLENOL Take 325-650 mg by mouth every 6 (six) hours as needed for headache or mild pain.   apixaban 5 MG Tabs tablet Commonly known as: ELIQUIS Take 1 tablet (5 mg total) by mouth 2 (two) times daily.  Biotin 1000 MCG tablet Take 1,000 mcg by mouth at bedtime.   docusate sodium 100 MG capsule Commonly known as: COLACE Take 1 capsule (100 mg total) by mouth 2 (two) times daily as needed for mild constipation or moderate constipation.   NON FORMULARY Take 1 tablet by mouth See admin instructions. D-arginine tablets- Take 1 tablet by mouth at bedtime   pantoprazole 40 MG tablet Commonly known as: PROTONIX Take 1 tablet (40 mg total) by mouth 2 (two) times daily before a meal. What changed: when to take this   polyethylene glycol 17 g packet Commonly known as: MIRALAX / GLYCOLAX Take 17 g by mouth daily.   simvastatin 20 MG tablet Commonly known as: ZOCOR Take 20 mg by mouth at bedtime.   sucralfate 1 g tablet Commonly known as: CARAFATE Take 1 g by mouth in the morning.   torsemide 20 MG tablet Commonly known as:  DEMADEX Take 20 mg by mouth daily.   traMADol 50 MG tablet Commonly known as: ULTRAM Take 50 mg by mouth in the morning.   triamterene-hydrochlorothiazide 37.5-25 MG capsule Commonly known as: DYAZIDE Take 1 capsule by mouth daily.   Vitamin D3 125 MCG (5000 UT) Tabs Take 5,000 Units by mouth daily.        Allergies  Allergen Reactions   Penicillins Rash and Other (See Comments)    Has patient had a PCN reaction causing immediate rash, facial/tongue/throat swelling, SOB or lightheadedness with hypotension: No Has patient had a PCN reaction causing severe rash involving mucus membranes or skin necrosis: No Has patient had a PCN reaction that required hospitalization: No Has patient had a PCN reaction occurring within the last 10 years: No If all of the above answers are "NO", then may proceed with Cephalosporin use.    Sulfa Antibiotics Anaphylaxis and Swelling   Ace Inhibitors Cough   Dexlansoprazole Other (See Comments)    Syncope    Oxycodone Nausea Only    You were cared for by a hospitalist during your hospital stay. If you have any questions about your discharge medications or the care you received while you were in the hospital after you are discharged, you can call the unit and asked to speak with the hospitalist on call if the hospitalist that took care of you is not available. Once you are discharged, your primary care physician will handle any further medical issues. Please note that no refills for any discharge medications will be authorized once you are discharged, as it is imperative that you return to your primary care physician (or establish a relationship with a primary care physician if you do not have one) for your aftercare needs so that they can reassess your need for medications and monitor your lab values.   Procedures/Studies: CT Angio Chest Pulmonary Embolism (PE) W or WO Contrast  Result Date: 09/23/2022 CLINICAL DATA:  High clinical suspicion for  PE, abnormal echocardiogram, upper GI bleed EXAM: CT ANGIOGRAPHY CHEST WITH CONTRAST TECHNIQUE: Multidetector CT imaging of the chest was performed using the standard protocol during bolus administration of intravenous contrast. Multiplanar CT image reconstructions and MIPs were obtained to evaluate the vascular anatomy. RADIATION DOSE REDUCTION: This exam was performed according to the departmental dose-optimization program which includes automated exposure control, adjustment of the mA and/or kV according to patient size and/or use of iterative reconstruction technique. CONTRAST:  11mL OMNIPAQUE IOHEXOL 350 MG/ML SOLN COMPARISON:  Previous studies including the CT done on 2021/03/01 FINDINGS: Cardiovascular: There are intraluminal filling  defects in few segmental and subsegmental branches in right lower lung fields. This finding was not seen in the previous study. There is small thrombus burden. RV LV ratio is 1.3. This may suggest chronic right heart strain. There are scattered coronary artery calcifications. There is homogeneous enhancement in thoracic aorta. Minimal pericardial effusion is seen. Mediastinum/Nodes: No significant lymphadenopathy is seen. There is large fixed hiatal hernia in the posterior mediastinum. Lungs/Pleura: There are linear densities in the left lower lung fields suggesting scarring or subsegmental atelectasis. Left hemidiaphragm is elevated. There is no pleural effusion or pneumothorax. Upper Abdomen: Surgical clips are seen in gallbladder fossa. 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 Large fixed hiatal hernia is seen. Musculoskeletal: Compression fracture of body of T12 vertebra has not changed. Review of the MIP images confirms the above findings. IMPRESSION: There are filling defects seen segmental and subsegmental pulmonary artery branches in right lower lung field suggesting acute PE with small thrombus burden. RV LV ratio is 1.3. This may be due to chronic right  heart dysfunction. Please correlate with clinical findings. There are scattered coronary artery calcifications. Minimal pericardial effusion. Small patchy infiltrates in left lower lung fields may suggest atelectasis/pneumonia. There is large fixed hiatal hernia. There is 1.4 cm calcified aneurysm in splenic artery with no significant interval change. Imaging findings were relayed to patient's provider Dr. Nelson Chimes by telephone call. Electronically Signed   By: Ernie Avena M.D.   On: 09/23/2022 16:09   VAS [place] LOWER EXTREMITY VENOUS (DVT)  Result Date: 09/23/2022  Lower Venous DVT Study Patient Name:  Cheryl Goodman  Date of Exam:   09/23/2022 Medical Rec #: [id number]           Accession #:    [id number] Date of Birth: 1941/04/19           Patient Gender: F Patient Age:   26 years Exam Location:  [facility] Procedure:      VAS [place] LOWER EXTREMITY VENOUS (DVT) Referring Phys: ABIGAIL CHAVEZ --------------------------------------------------------------------------------  Indications: Pain.  Limitations: Poor ultrasound/tissue interface, body habitus and pain intolerance. Comparison Study: Previous exam on 06/21/2022 was negative for DVT Performing Technologist: Ernestene Mention RVT, RDMS  Examination Guidelines: A complete evaluation includes B-mode imaging, spectral Doppler, color Doppler, and power Doppler as needed of all accessible portions of each vessel. Bilateral testing is considered an integral part of a complete examination. Limited examinations for reoccurring indications may be performed as noted. The reflux portion of the exam is performed with the patient in reverse Trendelenburg.  +--------+---------------+---------+-----------+----------+--------------------+ RIGHT   CompressibilityPhasicitySpontaneityPropertiesThrombus Aging       +--------+---------------+---------+-----------+----------+--------------------+ CFV     Full           Yes      Yes                                        +--------+---------------+---------+-----------+----------+--------------------+ SFJ     Full                                                              +--------+---------------+---------+-----------+----------+--------------------+ FV Prox Full           Yes  Yes                                       +--------+---------------+---------+-----------+----------+--------------------+ FV Mid  Full           Yes      Yes                                       +--------+---------------+---------+-----------+----------+--------------------+ FV      Full           Yes      Yes                                       Distal                                                                    +--------+---------------+---------+-----------+----------+--------------------+ PFV                    Yes      Yes                  patent by                                                                 color/doppler        +--------+---------------+---------+-----------+----------+--------------------+ POP     Full           Yes      Yes                                       +--------+---------------+---------+-----------+----------+--------------------+ PTV     Full                                                              +--------+---------------+---------+-----------+----------+--------------------+ PERO    Full                                                              +--------+---------------+---------+-----------+----------+--------------------+ hypoechoic cystic area noted in the thigh overlying the anterior aspect of the distal femur (6.75 x 1.48 x 2.79 cm).    Summary: RIGHT: - No evidence of common femoral vein obstruction.  LEFT: - There is no evidence of deep vein thrombosis in the lower extremity.  - No cystic  structure found in the popliteal fossa. - Hypoechoic cystic area noted in the thigh overlying the anterior aspect of the  distal femur (6.75 x 1.48 x 2.79 cm).  *See table(s) above for measurements and observations. Electronically signed by Lemar Livings MD on 09/23/2022 at 1:46:15 PM.    Final    ECHOCARDIOGRAM COMPLETE BUBBLE STUDY  Result Date: 09/22/2022    ECHOCARDIOGRAM REPORT   Patient Name:   Cheryl Goodman Date of Exam: 09/22/2022 Medical Rec #:  [id number]          Height:       59.0 in Accession #:    [id number]         Weight:       185.0 lb Date of Birth:  1941/09/23          BSA:          1.784 m Patient Age:    81 years           BP:           125/65 mmHg Patient Gender: F                  HR:           84 bpm. Exam Location:  Inpatient Procedure: 2D Echo, Cardiac Doppler, Color Doppler and Saline Contrast Bubble            Study Indications:    Stroke 434.91 / I63.9  History:        Patient has prior history of Echocardiogram examinations, most                 recent 08/02/2019. Risk Factors:Hypertension, Dyslipidemia and                 Sleep Apnea.  Sonographer:    Leta Jungling RDCS Referring Phys: [id number] Deno Lunger [facility] IMPRESSIONS  1. Left ventricular ejection fraction, by estimation, is 60 to 65%. The left ventricle has normal function. The left ventricle has no regional wall motion abnormalities. Left ventricular diastolic parameters are consistent with Grade I diastolic dysfunction (impaired relaxation).  2. Right ventricular systolic function is low normal. The right ventricular size is normal.  3. The mitral valve is grossly normal. No evidence of mitral valve regurgitation. No evidence of mitral stenosis.  4. The aortic valve is grossly normal. Aortic valve regurgitation is not visualized. No aortic stenosis is present.  5. Cannot exclude a small PFO by color doppler, however, agitated saline contrast bubble study was negative for right to left shunt. Comparison(s): Changes from prior study are noted. 08/02/2019: LVEF 60-65%, severe RV dysfunction (Right heart strain).  Conclusion(s)/Recommendation(s): No intracardiac source of embolism detected on this transthoracic study. Consider a transesophageal echocardiogram to exclude cardiac source of embolism if clinically indicated. FINDINGS  Left Ventricle: Left ventricular ejection fraction, by estimation, is 60 to 65%. The left ventricle has normal function. The left ventricle has no regional wall motion abnormalities. The left ventricular internal cavity size was normal in size. There is  no left ventricular hypertrophy. Left ventricular diastolic parameters are consistent with Grade I diastolic dysfunction (impaired relaxation). Right Ventricle: The right ventricular size is normal. No increase in right ventricular wall thickness. Right ventricular systolic function is low normal. Left Atrium: Left atrial size was normal in size. Right Atrium: Right atrial size was normal in size. Pericardium: There is no evidence of pericardial effusion. Presence of epicardial fat layer. Mitral Valve: The mitral valve is grossly normal.  No evidence of mitral valve regurgitation. No evidence of mitral valve stenosis. Tricuspid Valve: The tricuspid valve is grossly normal. Tricuspid valve regurgitation is not demonstrated. No evidence of tricuspid stenosis. Aortic Valve: The aortic valve is grossly normal. Aortic valve regurgitation is not visualized. No aortic stenosis is present. Pulmonic Valve: The pulmonic valve was grossly normal. Pulmonic valve regurgitation is trivial. No evidence of pulmonic stenosis. Aorta: The aortic root and ascending aorta are structurally normal, with no evidence of dilitation. IAS/Shunts: Cannot exclude a small PFO. Agitated saline contrast was given intravenously to evaluate for intracardiac shunting. Agitated saline contrast bubble study was negative, with no evidence of any interatrial shunt.  LEFT VENTRICLE PLAX 2D LVIDd:         3.90 cm   Diastology LVIDs:         2.00 cm   LV e' medial:    7.18 cm/s LV PW:          0.90 cm   LV E/e' medial:  13.1 LV IVS:        0.90 cm   LV e' lateral:   5.98 cm/s LVOT diam:     1.70 cm   LV E/e' lateral: 15.8 LV SV:         46 LV SV Index:   26 LVOT Area:     2.27 cm  RIGHT VENTRICLE RV S prime:     15.70 cm/s LEFT ATRIUM             Index        RIGHT ATRIUM           Index LA diam:        2.90 cm 1.63 cm/m   RA Area:     11.00 cm LA Vol (A2C):   60.7 ml 34.02 ml/m  RA Volume:   18.80 ml  10.54 ml/m LA Vol (A4C):   54.0 ml 30.26 ml/m LA Biplane Vol: 57.5 ml 32.23 ml/m  AORTIC VALVE LVOT Vmax:   120.00 cm/s LVOT Vmean:  64.500 cm/s LVOT VTI:    0.204 m  AORTA Ao Root diam: 3.30 cm Ao Asc diam:  3.20 cm MITRAL VALVE MV Area (PHT): 4.41 cm    SHUNTS MV Decel Time: 172 msec    Systemic VTI:  0.20 m MV E velocity: 94.30 cm/s  Systemic Diam: 1.70 cm MV A velocity: 81.40 cm/s MV E/A ratio:  1.16 Zoila Shutter MD Electronically signed by Zoila Shutter MD Signature Date/Time: 09/22/2022/2:36:53 PM    Final    CT ANGIO HEAD NECK W WO CM  Result Date: 09/21/2022 CLINICAL DATA:  Stroke suspected EXAM: CT ANGIOGRAPHY HEAD AND NECK TECHNIQUE: Multidetector CT imaging of the head and neck was performed using the standard protocol during bolus administration of intravenous contrast. Multiplanar CT image reconstructions and MIPs were obtained to evaluate the vascular anatomy. Carotid stenosis measurements (when applicable) are obtained utilizing NASCET criteria, using the distal internal carotid diameter as the denominator. RADIATION DOSE REDUCTION: This exam was performed according to the departmental dose-optimization program which includes automated exposure control, adjustment of the mA and/or kV according to patient size and/or use of iterative reconstruction technique. CONTRAST:  65mL OMNIPAQUE IOHEXOL 350 MG/ML SOLN COMPARISON:  Brain MRI 08/19/2022 common carotid Doppler 06/29/2017 FINDINGS: CT HEAD FINDINGS Brain: There is no acute intracranial hemorrhage, extra-axial fluid collection,  or acute territorial infarct. Background parenchymal volume is normal for age. The ventricles are normal in size. Background chronic small-vessel ischemic change and  remote infarct in the left caudate head/lentiform nucleus are unchanged. There is no mass lesion.  There is no mass effect or midline shift. Vascular: See below. Skull: Normal. Negative for fracture or focal lesion. Sinuses/Orbits: There is mucosal thickening in the left sphenoid sinus. A left lens implant is noted. The globes and orbits are otherwise unremarkable. Other: None. Review of the MIP images confirms the above findings CTA NECK FINDINGS Aortic arch: The imaged aortic arch is normal. The origins of the major branch vessels are patent. The subclavian arteries are patent to the level imaged. Right carotid system: The right common, internal, and external carotid arteries are patent, without hemodynamically significant stenosis or occlusion. There is no dissection or aneurysm. Left carotid system: The left common, internal, and external carotid arteries are patent, without hemodynamically significant stenosis or occlusion. There is no dissection or aneurysm. Vertebral arteries: The vertebral arteries are patent, without hemodynamically significant stenosis or occlusion. There is no dissection or aneurysm. Skeleton: There is no acute osseous abnormality or suspicious osseous lesion. There is no visible canal hematoma. Other neck: The soft tissues of the neck are unremarkable. Upper chest: The imaged lung apices are clear. Review of the MIP images confirms the above findings CTA HEAD FINDINGS Anterior circulation: There is calcified plaque in the carotid siphons bilaterally resulting in mild-to-moderate stenosis on the right and no greater than mild stenosis on the left. The bilateral MCAs are patent, without proximal stenosis or occlusion. The bilateral ACAs are patent, without proximal stenosis or occlusion. The anterior communicating artery is  normal. There is no aneurysm or AVM. Posterior circulation: The bilateral V4 segments are patent. The basilar artery is patent. The major cerebellar arteries appear patent. The bilateral PCAs are patent. There is focal severe stenosis of the left distal P2 segment (12-157). There is no other proximal stenosis or occlusion. A left posterior communicating artery is identified. There is no aneurysm or AVM. Venous sinuses: Patent. Anatomic variants: None. Review of the MIP images confirms the above findings IMPRESSION: 1. No acute intracranial pathology on initial noncontrast head CT. 2.  No emergent large vessel occlusion. 3. Focal severe stenosis of the left P2 segment. Otherwise, patent intracranial vasculature. 4. Patent vasculature of the neck with no hemodynamically significant stenosis or occlusion. Electronically Signed   By: Lesia Hausen M.D.   On: 09/21/2022 18:09   US RENAL  Result Date: 09/20/2022 CLINICAL DATA:  Renal failure. EXAM: RENAL / URINARY TRACT ULTRASOUND COMPLETE COMPARISON:  April 22, 2008. FINDINGS: Right Kidney: Renal measurements: 10.1 x 5.0 x 4.5 cm. Echogenicity within normal limits. No mass or hydronephrosis visualized. Left Kidney: Renal measurements: 9.1 x 4.7 x 4.4 cm . Echogenicity within normal limits. No mass or hydronephrosis visualized. Bladder: Appears normal for degree of bladder distention. Calculated prevoid volume of 50 mL. Other: None. IMPRESSION: Normal renal ultrasound. Electronically Signed   By: Lupita Raider M.D.   On: 09/20/2022 14:36   CT Angio Abd/Pel W and/or Wo Contrast  Result Date: 09/18/2022 CLINICAL DATA:  Lower GI bleed.  Blood in stool. EXAM: CTA ABDOMEN AND PELVIS WITHOUT AND WITH CONTRAST TECHNIQUE: Multidetector CT imaging of the abdomen and pelvis was performed using the standard protocol during bolus administration of intravenous contrast. Multiplanar reconstructed images and MIPs were obtained and reviewed to evaluate the vascular anatomy.  RADIATION DOSE REDUCTION: This exam was performed according to the departmental dose-optimization program which includes automated exposure control, adjustment of the mA and/or kV according to patient size  and/or use of iterative reconstruction technique. CONTRAST:  75mL OMNIPAQUE IOHEXOL 350 MG/ML SOLN COMPARISON:  CT abdomen pelvis 02/11/2021 FINDINGS: VASCULAR Aorta: Normal caliber aorta without aneurysm, dissection, vasculitis or significant stenosis. Celiac: Patent without evidence of aneurysm, dissection, vasculitis or significant stenosis. A 1.2 cm peripherally calcified splenic artery aneurysm is noted, stable compared to 04/22/2008 (series 4, image 37). SMA: Patent without evidence of aneurysm, dissection, vasculitis or significant stenosis. Renals: Both renal arteries are patent without evidence of aneurysm, dissection, vasculitis, fibromuscular dysplasia or significant stenosis. IMA: Patent without evidence of aneurysm, dissection, vasculitis or significant stenosis. Inflow: Patent without evidence of aneurysm, dissection, vasculitis or significant stenosis. Proximal Outflow: Bilateral common femoral and visualized portions of the superficial and profunda femoral arteries are patent without evidence of aneurysm, dissection, vasculitis or significant stenosis. Veins: Patent. Review of the MIP images confirms the above findings. NON-VASCULAR Lower chest: No acute abnormality. Hepatobiliary: No focal liver abnormality is seen. Status post cholecystectomy. No biliary dilatation. Pancreas: Unremarkable. No pancreatic ductal dilatation or surrounding inflammatory changes. Spleen: Normal in size without focal abnormality. Adrenals/Urinary Tract: Adrenal glands are unremarkable. Kidneys are normal, without renal calculi, focal lesion, or hydronephrosis. Bladder is unremarkable. Stomach/Bowel: Partially visualized large hiatal hernia appears to involve the entire stomach, not included on the field of view. A 3.1  cm fluid and air-filled diverticulum is noted in the posterior aspect of the second portion of the duodenum (series 11, image 26). Diverticulosis coli. No evidence of bowel wall thickening, distention, or inflammatory changes. Lymphatic: Mild aortic bi-iliac atherosclerosis. No enlarged abdominal or pelvic lymph nodes. Reproductive: Status post hysterectomy. No adnexal masses. Other: No abdominal wall hernia or abnormality. No abdominopelvic ascites. Musculoskeletal: No acute or suspicious osseous findings. Chronic compression fracture of the T12 vertebral body. IMPRESSION: VASCULAR 1. No evidence of active gastrointestinal bleeding. 2. Stable 1.2 cm splenic artery aneurysm. 3. Mild aorto bi-iliac atherosclerosis. Aortic Atherosclerosis (ICD10-I70.0). NON-VASCULAR 1. No acute abnormality in the abdomen or pelvis. 2. Large hiatal hernia. 3. Diverticulosis coli.  No CT evidence of diverticulitis. Electronically Signed   By: Sherron Ales M.D.   On: 09/18/2022 16:01     The results of significant diagnostics from this hospitalization (including imaging, microbiology, ancillary and laboratory) are listed below for reference.     Microbiology: Recent Results (from the past 240 hour(s))  Resp panel by RT-PCR (RSV, Flu A&B, Covid) Anterior Nasal Swab     Status: None   Collection Time: 09/18/22  2:00 PM   Specimen: Anterior Nasal Swab  Result Value Ref Range Status   SARS Coronavirus 2 by RT PCR NEGATIVE NEGATIVE Final    Comment: (NOTE) SARS-CoV-2 target nucleic acids are NOT DETECTED.  The SARS-CoV-2 RNA is generally detectable in upper respiratory specimens during the acute phase of infection. The lowest concentration of SARS-CoV-2 viral copies this assay can detect is 138 copies/mL. A negative result does not preclude SARS-Cov-2 infection and should not be used as the sole basis for treatment or other patient management decisions. A negative result may occur with  improper specimen  collection/handling, submission of specimen other than nasopharyngeal swab, presence of viral mutation(s) within the areas targeted by this assay, and inadequate number of viral copies(<138 copies/mL). A negative result must be combined with clinical observations, patient history, and epidemiological information. The expected result is Negative.  Fact Sheet for Patients:  BloggerCourse.com  Fact Sheet for Healthcare Providers:  SeriousBroker.it  This test is no t yet approved or cleared by the [place]  FDA and  has been authorized for detection and/or diagnosis of SARS-CoV-2 by FDA under an Emergency Use Authorization (EUA). This EUA will remain  in effect (meaning this test can be used) for the duration of the COVID-19 declaration under Section 564(b)(1) of the Act, 21 U.S.C.section 360bbb-3(b)(1), unless the authorization is terminated  or revoked sooner.       Influenza A by PCR NEGATIVE NEGATIVE Final   Influenza B by PCR NEGATIVE NEGATIVE Final    Comment: (NOTE) The Xpert Xpress SARS-CoV-2/FLU/RSV plus assay is intended as an aid in the diagnosis of influenza from Nasopharyngeal swab specimens and should not be used as a sole basis for treatment. Nasal washings and aspirates are [email or web address] testing.  Fact Sheet for Patients: https://www.fda.gov/media/152166/download  Fact Sheet for Healthcare Providers: SeriousBroker.it  This test is not yet approved or cleared by the [place] FDA and has been authorized for detection and/or diagnosis of SARS-CoV-2 by FDA under an Emergency Use Authorization (EUA). This EUA will remain in effect (meaning this test can be used) for the duration of the COVID-19 declaration under Section 564(b)(1) of the Act, 21 U.S.C. section 360bbb-3(b)(1), unless the authorization is terminated or revoked.     Resp Syncytial  Virus by PCR NEGATIVE NEGATIVE Final    Comment: (NOTE) Fact Sheet for Patients: BloggerCourse.com  Fact Sheet for Healthcare Providers: SeriousBroker.it  This test is not yet approved or cleared by the [place] FDA and has been authorized for detection and/or diagnosis of SARS-CoV-2 by FDA under an Emergency Use Authorization (EUA). This EUA will remain in effect (meaning this test can be used) for the duration of the COVID-19 declaration under Section 564(b)(1) of the Act, 21 U.S.C. section 360bbb-3(b)(1), unless the authorization is terminated or revoked.  Performed at [facility], [address], [place], [place] [address]   Urine Culture     Status: Abnormal   Collection Time: 09/18/22  5:28 PM   Specimen: Urine, Clean Catch  Result Value Ref Range Status   Specimen Description   Final    URINE, CLEAN CATCH Performed at [facility], [address] [place], [place] [address]    Special Requests   Final    NONE Performed at [facility], [address] [place], [place] [address]    Culture >=100,000 COLONIES/mL KLEBSIELLA PNEUMONIAE (A)  Final   Report Status 09/20/2022 FINAL  Final   Organism ID, Bacteria KLEBSIELLA PNEUMONIAE (A)  Final      Susceptibility   Klebsiella pneumoniae - MIC*    AMPICILLIN RESISTANT Resistant     CEFAZOLIN <=4 SENSITIVE Sensitive     CEFEPIME <=0.12 SENSITIVE Sensitive     CEFTRIAXONE <=0.25 SENSITIVE Sensitive     CIPROFLOXACIN <=0.25 SENSITIVE Sensitive     GENTAMICIN <=1 SENSITIVE Sensitive     IMIPENEM <=0.25 SENSITIVE Sensitive     NITROFURANTOIN <=16 SENSITIVE Sensitive     TRIMETH/SULFA <=20 SENSITIVE Sensitive     AMPICILLIN/SULBACTAM <=2 SENSITIVE Sensitive     PIP/TAZO <=4 SENSITIVE Sensitive     * >=100,000 COLONIES/mL KLEBSIELLA PNEUMONIAE     Labs: BNP (last 3 results) No results for input(s): "BNP" in the  last 8760 hours. Basic Metabolic Panel: Recent Labs  Lab 09/22/22 0732 09/23/22 0546 09/24/22 0042 09/25/22 0753 09/26/22 0640  NA 139 140 136 142 141  K 3.8 4.1 3.7 3.7 3.6  CL 110 111 106 111 111  CO2 22 23 24  25 24  GLUCOSE 102* 101* 103* 123* 104*  BUN 21 20 20 15 15   CREATININE 1.24* 1.21* 1.36* 1.16* 1.21*  CALCIUM 8.6* 8.1* 8.2* 8.6* 8.6*  MG 2.2 2.2 2.1 2.5* 2.3   Liver Function Tests: Recent Labs  Lab 09/20/22 0749 09/21/22 0808 09/22/22 0732  AST 14* 14* 15  ALT 14 15 14   ALKPHOS 49 48 54  BILITOT 0.7 0.7 0.7  PROT 5.7* 5.5* 5.6*  ALBUMIN 3.1* 3.0* 3.0*   No results for input(s): "LIPASE", "AMYLASE" in the last 168 hours. No results for input(s): "AMMONIA" in the last 168 hours. CBC: Recent Labs  Lab 09/21/22 0808 09/22/22 0732 09/23/22 0546 09/23/22 1858 09/24/22 0042 09/25/22 0753 09/26/22 0640  WBC 16.5* 16.0* 13.6*  --  13.4* 8.8 9.1  NEUTROABS 12.1* 12.1*  --   --   --   --   --   HGB 8.0* 8.1* 7.3* 9.1* 8.8* 9.4* 9.1*  HCT 26.4* 28.3* 25.0* 29.5* 28.8* 30.6* 30.4*  MCV 91.0 95.6 94.7  --  91.7 91.9 92.4  PLT 295 320 213  --  297 308 309   Cardiac Enzymes: No results for input(s): "CKTOTAL", "CKMB", "CKMBINDEX", "TROPONINI" in the last 168 hours. BNP: Invalid input(s): "POCBNP" CBG: No results for input(s): "GLUCAP" in the last 168 hours. D-Dimer No results for input(s): "DDIMER" in the last 72 hours. Hgb A1c No results for input(s): "HGBA1C" in the last 72 hours. Lipid Profile No results for input(s): "CHOL", "HDL", "LDLCALC", "TRIG", "CHOLHDL", "LDLDIRECT" in the last 72 hours. Thyroid function studies No results for input(s): "TSH", "T4TOTAL", "T3FREE", "THYROIDAB" in the last 72 hours.  Invalid input(s): "FREET3" Anemia work up No results for input(s): "VITAMINB12", "FOLATE", "FERRITIN", "TIBC", "IRON", "RETICCTPCT" in the last 72 hours. Urinalysis    Component Value Date/Time   COLORURINE YELLOW 09/18/2022 1632    APPEARANCEUR HAZY (A) 09/18/2022 1632   APPEARANCEUR Clear 02/17/2013 1708   LABSPEC 1.023 09/18/2022 1632   LABSPEC 1.004 02/17/2013 1708   PHURINE 5.0 09/18/2022 1632   GLUCOSEU NEGATIVE 09/18/2022 1632   GLUCOSEU Negative 02/17/2013 1708   HGBUR NEGATIVE 09/18/2022 1632   BILIRUBINUR NEGATIVE 09/18/2022 1632   BILIRUBINUR Negative 02/17/2013 1708   KETONESUR NEGATIVE 09/18/2022 1632   PROTEINUR NEGATIVE 09/18/2022 1632   NITRITE NEGATIVE 09/18/2022 1632   LEUKOCYTESUR MODERATE (A) 09/18/2022 1632   LEUKOCYTESUR 1+ 02/17/2013 1708   Sepsis Labs Recent Labs  Lab 09/23/22 0546 09/24/22 0042 09/25/22 0753 09/26/22 0640  WBC 13.6* 13.4* 8.8 9.1   Microbiology Recent Results (from the past 240 hour(s))  Resp panel by RT-PCR (RSV, Flu A&B, Covid) Anterior Nasal Swab     Status: None   Collection Time: 09/18/22  2:00 PM   Specimen: Anterior Nasal Swab  Result Value Ref Range Status   SARS Coronavirus 2 by RT PCR NEGATIVE NEGATIVE Final    Comment: (NOTE) SARS-CoV-2 target nucleic acids are NOT DETECTED.  The SARS-CoV-2 RNA is generally detectable in upper respiratory specimens during the acute phase of infection. The lowest concentration of SARS-CoV-2 viral copies this assay can detect is 138 copies/mL. A negative result does not preclude SARS-Cov-2 infection and should not be used as the sole basis for treatment or other patient management decisions. A negative result may occur with  improper specimen collection/handling, submission of specimen other than nasopharyngeal swab, presence of viral mutation(s) within the areas targeted by this assay, and inadequate number of viral copies(<138 copies/mL). A negative result must be combined with  clinical observations, patient history, and epidemiological information. The expected result is Negative.  Fact Sheet for Patients:  BloggerCourse.com  Fact Sheet for Healthcare Providers:   SeriousBroker.it  This test is no t yet approved or cleared by the [place] FDA and  has been authorized for detection and/or diagnosis of SARS-CoV-2 by FDA under an Emergency Use Authorization (EUA). This EUA will remain  in effect (meaning this test can be used) for the duration of the COVID-19 declaration under Section 564(b)(1) of the Act, 21 U.S.C.section 360bbb-3(b)(1), unless the authorization is terminated  or revoked sooner.       Influenza A by PCR NEGATIVE NEGATIVE Final   Influenza B by PCR NEGATIVE NEGATIVE Final    Comment: (NOTE) The Xpert Xpress SARS-CoV-2/FLU/RSV plus assay is intended as an aid in the diagnosis of influenza from Nasopharyngeal swab specimens and should not be used as a sole basis for treatment. Nasal washings and aspirates are unacceptable for Xpert Xpress SARS-CoV-2/FLU/RSV testing.  Fact Sheet for Patients: BloggerCourse.com  Fact Sheet for Healthcare Providers: SeriousBroker.it  This test is not yet approved or cleared by the [place] FDA and has been authorized for detection and/or diagnosis of SARS-CoV-2 by FDA under an Emergency Use Authorization (EUA). This EUA will remain in effect (meaning this test can be used) for the duration of the COVID-19 declaration under Section 564(b)(1) of the Act, 21 U.S.C. section 360bbb-3(b)(1), unless the authorization is terminated or revoked.     Resp Syncytial Virus by PCR NEGATIVE NEGATIVE Final    Comment: (NOTE) Fact Sheet for Patients: BloggerCourse.com  Fact Sheet for Healthcare Providers: SeriousBroker.it  This test is not yet approved or cleared by the [place] FDA and has been authorized for detection and/or diagnosis of SARS-CoV-2 by FDA under an Emergency Use Authorization (EUA). This EUA will remain in effect (meaning this test can be used) for  the duration of the COVID-19 declaration under Section 564(b)(1) of the Act, 21 U.S.C. section 360bbb-3(b)(1), unless the authorization is terminated or revoked.  Performed at [facility], [place], [address] [place], [place] [address]   Urine Culture     Status: Abnormal   Collection Time: 09/18/22  5:28 PM   Specimen: Urine, Clean Catch  Result Value Ref Range Status   Specimen Description   Final    URINE, CLEAN CATCH Performed at [facility], [address], [place], [place] [address]    Special Requests   Final    NONE Performed at [facility], [address], [place], [place] [address]    Culture >=100,000 COLONIES/mL KLEBSIELLA PNEUMONIAE (A)  Final   Report Status 09/20/2022 FINAL  Final   Organism ID, Bacteria KLEBSIELLA PNEUMONIAE (A)  Final      Susceptibility   Klebsiella pneumoniae - MIC*    AMPICILLIN RESISTANT Resistant     CEFAZOLIN <=4 SENSITIVE Sensitive     CEFEPIME <=0.12 SENSITIVE Sensitive     CEFTRIAXONE <=0.25 SENSITIVE Sensitive     CIPROFLOXACIN <=0.25 SENSITIVE Sensitive     GENTAMICIN <=1 SENSITIVE Sensitive     IMIPENEM <=0.25 SENSITIVE Sensitive     NITROFURANTOIN <=16 SENSITIVE Sensitive     TRIMETH/SULFA <=20 SENSITIVE Sensitive     AMPICILLIN/SULBACTAM <=2 SENSITIVE Sensitive     PIP/TAZO <=4 SENSITIVE Sensitive     * >=100,000 COLONIES/mL KLEBSIELLA PNEUMONIAE     Time coordinating discharge:  I have spent 35 minutes face to face with the patient and on  the ward discussing the patients care, assessment, plan and disposition with other care givers. >50% of the time was devoted counseling the patient about the risks and benefits of treatment/Discharge disposition and coordinating care.   SIGNED:   Dimple NanasAnkit Chirag Carol Loftin, MD  [facility] 09/26/2022, 10:26 AM   If 7PM-7AM, please contact night-coverage

## 2022-09-26 NOTE — Plan of Care (Signed)
  Problem: Education: Goal: Knowledge of General Education information will improve Description: Including pain rating scale, medication(s)/side effects and non-pharmacologic comfort measures Outcome: Adequate for Discharge   Problem: Health Behavior/Discharge Planning: Goal: Ability to manage health-related needs will improve Outcome: Adequate for Discharge   Problem: Clinical Measurements: Goal: Ability to maintain clinical measurements within normal limits will improve Outcome: Adequate for Discharge Goal: Will remain free from infection Outcome: Adequate for Discharge Goal: Diagnostic test results will improve Outcome: Adequate for Discharge Goal: Respiratory complications will improve Outcome: Adequate for Discharge Goal: Cardiovascular complication will be avoided Outcome: Adequate for Discharge   Problem: Activity: Goal: Risk for activity intolerance will decrease Outcome: Adequate for Discharge   Problem: Nutrition: Goal: Adequate nutrition will be maintained Outcome: Adequate for Discharge   Problem: Coping: Goal: Level of anxiety will decrease Outcome: Adequate for Discharge   Problem: Elimination: Goal: Will not experience complications related to bowel motility Outcome: Adequate for Discharge Goal: Will not experience complications related to urinary retention Outcome: Adequate for Discharge   Problem: Pain Managment: Goal: General experience of comfort will improve Outcome: Adequate for Discharge   Problem: Safety: Goal: Ability to remain free from injury will improve Outcome: Adequate for Discharge   Problem: Skin Integrity: Goal: Risk for impaired skin integrity will decrease Outcome: Adequate for Discharge  Pt discharged home with husband. Written information given.

## 2022-09-27 ENCOUNTER — Encounter (HOSPITAL_COMMUNITY): Payer: Self-pay | Admitting: Gastroenterology

## 2022-09-30 DIAGNOSIS — K922 Gastrointestinal hemorrhage, unspecified: Secondary | ICD-10-CM | POA: Diagnosis not present

## 2022-10-05 DIAGNOSIS — S22080A Wedge compression fracture of T11-T12 vertebra, initial encounter for closed fracture: Secondary | ICD-10-CM | POA: Diagnosis not present

## 2022-10-05 DIAGNOSIS — K449 Diaphragmatic hernia without obstruction or gangrene: Secondary | ICD-10-CM | POA: Diagnosis not present

## 2022-10-05 DIAGNOSIS — D500 Iron deficiency anemia secondary to blood loss (chronic): Secondary | ICD-10-CM | POA: Diagnosis not present

## 2022-10-05 DIAGNOSIS — M8588 Other specified disorders of bone density and structure, other site: Secondary | ICD-10-CM | POA: Diagnosis not present

## 2022-10-05 DIAGNOSIS — K922 Gastrointestinal hemorrhage, unspecified: Secondary | ICD-10-CM | POA: Diagnosis not present

## 2022-10-05 DIAGNOSIS — S32010A Wedge compression fracture of first lumbar vertebra, initial encounter for closed fracture: Secondary | ICD-10-CM | POA: Diagnosis not present

## 2022-10-05 DIAGNOSIS — Z86711 Personal history of pulmonary embolism: Secondary | ICD-10-CM | POA: Diagnosis not present

## 2022-10-05 DIAGNOSIS — M4316 Spondylolisthesis, lumbar region: Secondary | ICD-10-CM | POA: Diagnosis not present

## 2022-10-07 ENCOUNTER — Ambulatory Visit
Admission: RE | Admit: 2022-10-07 | Discharge: 2022-10-07 | Disposition: A | Discharge status: Home / Self Care | Payer: Medicare HMO | Source: Ambulatory Visit | Attending: Internal Medicine | Admitting: Internal Medicine

## 2022-10-07 ENCOUNTER — Other Ambulatory Visit: Payer: Self-pay | Admitting: Internal Medicine

## 2022-10-07 DIAGNOSIS — S32030A Wedge compression fracture of third lumbar vertebra, initial encounter for closed fracture: Secondary | ICD-10-CM

## 2022-10-07 DIAGNOSIS — M545 Low back pain, unspecified: Secondary | ICD-10-CM | POA: Diagnosis not present

## 2022-10-07 DIAGNOSIS — M4316 Spondylolisthesis, lumbar region: Secondary | ICD-10-CM | POA: Diagnosis not present

## 2022-10-07 DIAGNOSIS — M48061 Spinal stenosis, lumbar region without neurogenic claudication: Secondary | ICD-10-CM | POA: Diagnosis not present

## 2022-10-07 DIAGNOSIS — M5136 Other intervertebral disc degeneration, lumbar region: Secondary | ICD-10-CM | POA: Diagnosis not present

## 2022-10-12 ENCOUNTER — Ambulatory Visit
Admission: RE | Admit: 2022-10-12 | Discharge: 2022-10-12 | Disposition: A | Discharge status: Home / Self Care | Payer: Medicare HMO | Source: Ambulatory Visit | Attending: Internal Medicine | Admitting: Internal Medicine

## 2022-10-12 ENCOUNTER — Other Ambulatory Visit: Payer: Self-pay | Admitting: Internal Medicine

## 2022-10-12 DIAGNOSIS — M8008XA Age-related osteoporosis with current pathological fracture, vertebra(e), initial encounter for fracture: Secondary | ICD-10-CM

## 2022-10-12 DIAGNOSIS — S32030A Wedge compression fracture of third lumbar vertebra, initial encounter for closed fracture: Secondary | ICD-10-CM

## 2022-10-12 HISTORY — DX: Cerebral infarction, unspecified: I63.9

## 2022-10-12 HISTORY — DX: Anemia, unspecified: D64.9

## 2022-10-12 NOTE — Consult Note (Signed)
Chief Complaint: Patient was seen in consultation today for lumbar spine pain, L3 fracture at the request of Bethann PunchesMiller,Mark F  Referring Physician(s): Miller,Mark F  History of Present Illness: Cheryl Goodman is a 82 y.o. female Who was in her usual state of health until 24 December when she twisted and tried to reach around behind her.  She felt a pop and sudden onset of back pain.  Over the coming days, her pain got increasingly worse to the point that it was excruciating and she could barely get out of bed without crying.  She has a history of remote prior compression fracture at T12 with resultant exaggerated kyphosis and some chronic back pain issues.  Thinking this was just an exacerbation of her underlying back issues she went to see Dr. Hyacinth MeekerMiller and got an injection in her back.  Unfortunately, this did nothing and her pain continue to worsen.  An MRI was then ordered which demonstrated an acute/subacute fracture of the superior endplate of L3 with approximately 15% height loss.  Her pain is quite severe.  At baseline if she is doing nothing and staying still it rates a 4 out of 10 on a 10 point scale but increases to a 9 out of 10 when she attempts to walk or change her position.  Additionally, her pain is quite debilitating.  She scored an 18 out of 24 on the [employer] disability questionnaire.  She denies lower extremity weakness, paresthesias or changes in bowel or bladder function.  Past Medical History:  Diagnosis Date   Acute pulmonary embolism with acute cor pulmonale (HCC) 08/10/2019   Anemia    Arthritis    Dysphagia    GERD (gastroesophageal reflux disease)    History of hiatal hernia    History of kidney stones    x2 ; passed independently   Hyperlipidemia    Hypertension    Nausea    OA (osteoarthritis) of knee 10/31/2017   Sleep apnea    Stroke [facility](HCC)     Past Surgical History:  Procedure Laterality Date   ABDOMINAL HYSTERECTOMY     APPENDECTOMY      BREAST BIOPSY Right    BREAST SURGERY     CARPAL TUNNEL RELEASE     CHOLECYSTECTOMY     COLONOSCOPY WITH PROPOFOL N/A 09/01/2015   Procedure: COLONOSCOPY WITH PROPOFOL;  Surgeon: Scot Junobert T Elliott, MD;  [facility];  Service: Endoscopy;  Laterality: N/A;   COLONOSCOPY WITH PROPOFOL N/A 04/29/2021   Procedure: COLONOSCOPY WITH PROPOFOL;  Surgeon: Earline MayotteByrnett, Jeffrey W, MD;  [facility];  Service: Gastroenterology;  Laterality: N/A;   ESOPHAGOGASTRODUODENOSCOPY N/A 07/17/2019   Procedure: ESOPHAGOGASTRODUODENOSCOPY (EGD);  Surgeon: Pasty Spillersahiliani, Varnita B, MD;  [facility];  Service: Endoscopy;  Laterality: N/A;   ESOPHAGOGASTRODUODENOSCOPY (EGD) WITH PROPOFOL N/A 04/29/2021   Procedure: ESOPHAGOGASTRODUODENOSCOPY (EGD) WITH PROPOFOL;  Surgeon: Earline MayotteByrnett, Jeffrey W, MD;  Location: [facility] ENDOSCOPY;  Service: Gastroenterology;  Laterality: N/A;   ESOPHAGOGASTRODUODENOSCOPY (EGD) WITH PROPOFOL N/A 09/21/2022   Procedure: ESOPHAGOGASTRODUODENOSCOPY (EGD) WITH PROPOFOL;  Surgeon: Willis Modenautlaw, William, MD;  Location: [facility] ENDOSCOPY;  Service: Gastroenterology;  Laterality: N/A;   REDUCTION MAMMAPLASTY Bilateral    SAVORY DILATION N/A 09/01/2015   Procedure: SAVORY DILATION;  Surgeon: Scot Junobert T Elliott, MD;  [facility];  Service: Endoscopy;  Laterality: N/A;   TOTAL KNEE ARTHROPLASTY Right 10/31/2017   Procedure: RIGHT TOTAL KNEE ARTHROPLASTY;  Surgeon: Ollen GrossAluisio, Frank, MD;  Location: [facility] ORS;  Service: Orthopedics;  Laterality: Right;  Allergies: Sulfa antibiotics, Dexlansoprazole, Ace inhibitors, Oxycodone, and Penicillins  Medications: Prior to Admission medications   Medication Sig Start Date End Date Taking? Authorizing Provider  acetaminophen (TYLENOL) 325 MG tablet Take 325-650 mg by mouth every 6 (six) hours as needed for headache or mild pain.   Yes [provider]  acetaminophen-codeine (TYLENOL #3) 300-30 MG tablet Take 1 tablet by mouth every  4 (four) hours as needed for moderate pain.   Yes [provider]  apixaban (ELIQUIS) 5 MG TABS tablet Take 1 tablet (5 mg total) by mouth 2 (two) times daily. 09/26/22 10/26/22 Yes Amin, Loura Halt, MD  Biotin 1000 MCG tablet Take 1,000 mcg by mouth at bedtime.   Yes [provider]  Cholecalciferol (VITAMIN D3) 125 MCG (5000 UT) TABS Take 5,000 Units by mouth daily.   Yes [provider]  docusate sodium (COLACE) 100 MG capsule Take 1 capsule (100 mg total) by mouth 2 (two) times daily as needed for mild constipation or moderate constipation. 09/26/22  Yes Amin, Loura Halt, MD  NON FORMULARY Take 1 tablet by mouth See admin instructions. D-arginine tablets- Take 1 tablet by mouth at bedtime   Yes [provider]  pantoprazole (PROTONIX) 40 MG tablet Take 1 tablet (40 mg total) by mouth 2 (two) times daily before a meal. 09/26/22 11/25/22 Yes Amin, Ankit Chirag, MD  polyethylene glycol (MIRALAX / GLYCOLAX) 17 g packet Take 17 g by mouth daily. 08/07/19  Yes Rodolph Bong, MD  simvastatin (ZOCOR) 20 MG tablet Take 20 mg by mouth at bedtime.   Yes [provider]  sucralfate (CARAFATE) 1 g tablet Take 1 g by mouth in the morning.   Yes [provider]  traMADol (ULTRAM) 50 MG tablet Take 50 mg by mouth in the morning.   Yes [provider]  triamterene-hydrochlorothiazide (DYAZIDE) 37.5-25 MG capsule Take 1 capsule by mouth daily.   Yes [provider]  torsemide (DEMADEX) 20 MG tablet Take 20 mg by mouth daily.    [provider]     Family History  Problem Relation Age of Onset   Breast cancer Maternal Grandmother        in 29's   Heart disease Neg Hx     Social History   Socioeconomic History   Marital status: Married    Spouse name: Not on file   Number of children: Not on file   Years of education: Not on file   Highest education level: Not on file  Occupational History   Not on file  Tobacco  Use   Smoking status: Never   Smokeless tobacco: Never  Vaping Use   Vaping Use: Never used  Substance and Sexual Activity   Alcohol use: No   Drug use: No   Sexual activity: Yes    Birth control/protection: None    Comment: intercourse age 24, less than 5 sexual partners  Other Topics Concern   Not on file  Social History Narrative   Not on file   Social Determinants of Health   Financial Resource Strain: Not on file  Food Insecurity: No Food Insecurity (09/18/2022)   Hunger Vital Sign    Worried About Running Out of Food in the Last Year: Never true    Ran Out of Food in the Last Year: Never true  Transportation Needs: No Transportation Needs (09/18/2022)   PRAPARE - Administrator, Civil Service (Medical): No    Lack of Transportation (Non-Medical):  No  Physical Activity: Not on file  Stress: Not on file  Social Connections: Not on file    Review of Systems: A 12 point ROS discussed and pertinent positives are indicated in the HPI above.  All other systems are negative.  Review of Systems  Vital Signs: BP 123/79 (BP Location: Right Arm, Patient Position: Sitting, Cuff Size: Normal)   Pulse 86   Temp 97.9 F (36.6 C) (Oral)   Resp 16   Ht 4\' 11"  (1.499 m)   SpO2 97%   BMI 37.37 kg/m   Advance Care Plan: The advanced care plan/surrogate decision maker was discussed at the time of visit and the patient did not wish to discuss or was not able to name a surrogate decision maker or provide an advance care plan.    Physical Exam Constitutional:      General: She is not in acute distress.    Appearance: Normal appearance.  HENT:     Head: Normocephalic and atraumatic.  Eyes:     General: No scleral icterus. Cardiovascular:     Rate and Rhythm: Normal rate.  Pulmonary:     Effort: Pulmonary effort is normal.  Abdominal:     General: Abdomen is flat. There is no distension.     Tenderness: There is no abdominal tenderness.  Musculoskeletal:        Arms:     Comments: TTP over the L3 spinous process  Skin:    General: Skin is warm and dry.  Neurological:     Mental Status: She is alert and oriented to person, place, and time.  Psychiatric:        Mood and Affect: Mood normal.        Behavior: Behavior normal.       Imaging: DG Radiologist Eval And Mgmt  Result Date: 10/12/2022 EXAM: NEW PATIENT OFFICE VISIT CHIEF COMPLAINT: SEE EPIC NOTE HISTORY OF PRESENT ILLNESS: SEE EPIC NOTE REVIEW OF SYSTEMS: SEE EPIC NOTE PHYSICAL EXAMINATION: SEE EPIC NOTE ASSESSMENT AND PLAN: SEE EPIC NOTE Electronically Signed   By: Malachy MoanHeath  Braidyn Peace M.D.   On: 10/12/2022 10:58   MR LUMBAR SPINE WO CONTRAST  Result Date: 10/07/2022 CLINICAL DATA:  Low back pain with bilateral leg pain EXAM: MRI LUMBAR SPINE WITHOUT CONTRAST TECHNIQUE: Multiplanar, multisequence MR imaging of the lumbar spine was performed. No intravenous contrast was administered. COMPARISON:  MRI 01/21/2022 FINDINGS: Segmentation:  Standard. Alignment: Grade 1 anterolisthesis of L4 on L5 and L5 on S1. Focal kyphosis at the T11-T12 level related to remote T12 compression deformity. Vertebrae: Acute or subacute mild inferior endplate compression fracture of the L3 vertebral body with approximately 15% vertebral body height loss. Mild marrow edema. No retropulsion. No evidence of fracture extension into the posterior elements. Chronic T12 superior endplate compression deformity. No evidence of discitis. No suspicious bone lesion. Conus medullaris and cauda equina: Conus extends to the L1-L2 level. Conus and cauda equina appear normal. Paraspinal and other soft tissues: Negative. Disc levels: T11-T12: Unchanged impress upon the thecal sac with mild canal stenosis secondary to retropulsion of the T12 superior endplate. T12-L1: No significant disc protrusion, foraminal stenosis, or canal stenosis. L1-L2: Minimal right paracentral disc protrusion. No foraminal or canal stenosis. Unchanged. L2-L3: Minimal  disc bulge with mild bilateral facet arthropathy. No foraminal or canal stenosis. Unchanged. L3-L4: Minimal disc bulge. Mild bilateral facet arthropathy. No significant foraminal stenosis. No canal stenosis. Unchanged. L4-L5: Anterolisthesis with mild disc uncovering. Advanced bilateral facet arthropathy. Mild bilateral subarticular recess  stenosis. No significant canal stenosis. Mild bilateral foraminal stenosis is more pronounced on the right. Unchanged. L5-S1: Minimal disc uncovering and mild disc bulge. Right greater than left facet arthropathy. No foraminal or canal stenosis. Unchanged. IMPRESSION: 1. Acute or subacute mild inferior endplate compression fracture of the L3 vertebral body with approximately 15% vertebral body height loss. No retropulsion. 2. Chronic T12 superior endplate compression deformity. 3. Mild multilevel degenerative changes of the lumbar spine are not significantly changed compared to the prior MRI. Mild bilateral subarticular recess stenosis and mild bilateral foraminal stenosis at L4-5. No significant canal stenosis at any level. These results will be called to the ordering clinician or representative by the Radiologist Assistant, and communication documented in the PACS or [employer]. Electronically Signed   By: Duanne Guess D.O.   On: 10/07/2022 10:46   CT Angio Chest Pulmonary Embolism (PE) W or WO Contrast  Result Date: 09/23/2022 CLINICAL DATA:  High clinical suspicion for PE, abnormal echocardiogram, upper GI bleed EXAM: CT ANGIOGRAPHY CHEST WITH CONTRAST TECHNIQUE: Multidetector CT imaging of the chest was performed using the standard protocol during bolus administration of intravenous contrast. Multiplanar CT image reconstructions and MIPs were obtained to evaluate the vascular anatomy. RADIATION DOSE REDUCTION: This exam was performed according to the departmental dose-optimization program which includes automated exposure control, adjustment of the mA and/or kV  according to patient size and/or use of iterative reconstruction technique. CONTRAST:  59mL OMNIPAQUE IOHEXOL 350 MG/ML SOLN COMPARISON:  Previous studies including the CT done on 2021/03/01 FINDINGS: Cardiovascular: There are intraluminal filling defects in few segmental and subsegmental branches in right lower lung fields. This finding was not seen in the previous study. There is small thrombus burden. RV LV ratio is 1.3. This may suggest chronic right heart strain. There are scattered coronary artery calcifications. There is homogeneous enhancement in thoracic aorta. Minimal pericardial effusion is seen. Mediastinum/Nodes: No significant lymphadenopathy is seen. There is large fixed hiatal hernia in the posterior mediastinum. Lungs/Pleura: There are linear densities in the left lower lung fields suggesting scarring or subsegmental atelectasis. Left hemidiaphragm is elevated. There is no pleural effusion or pneumothorax. Upper Abdomen: Surgical clips are seen in gallbladder fossa. 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 Large fixed hiatal hernia is seen. Musculoskeletal: Compression fracture of body of T12 vertebra has not changed. Review of the MIP images confirms the above findings. IMPRESSION: There are filling defects seen segmental and subsegmental pulmonary artery branches in right lower lung field suggesting acute PE with small thrombus burden. RV LV ratio is 1.3. This may be due to chronic right heart dysfunction. Please correlate with clinical findings. There are scattered coronary artery calcifications. Minimal pericardial effusion. Small patchy infiltrates in left lower lung fields may suggest atelectasis/pneumonia. There is large fixed hiatal hernia. There is 1.4 cm calcified aneurysm in splenic artery with no significant interval change. Imaging findings were relayed to patient's provider Dr. Nelson Chimes by telephone call. Electronically Signed   By: Ernie Avena M.D.   On:  09/23/2022 16:09   VAS [place] LOWER EXTREMITY VENOUS (DVT)  Result Date: 09/23/2022  Lower Venous DVT Study Patient Name:  Cheryl Goodman  Date of Exam:   09/23/2022 Medical Rec #: [id number]           Accession #:    [id number] Date of Birth: 1941/05/05           Patient Gender: F Patient Age:   69 years Exam Location:  [facility] Procedure:      VAS [place] LOWER EXTREMITY VENOUS (DVT) Referring Phys: ABIGAIL CHAVEZ --------------------------------------------------------------------------------  Indications: Pain.  Limitations: Poor ultrasound/tissue interface, body habitus and pain intolerance. Comparison Study: Previous exam on 06/21/2022 was negative for DVT Performing Technologist: Ernestene Mention RVT, RDMS  Examination Guidelines: A complete evaluation includes B-mode imaging, spectral Doppler, color Doppler, and power Doppler as needed of all accessible portions of each vessel. Bilateral testing is considered an integral part of a complete examination. Limited examinations for reoccurring indications may be performed as noted. The reflux portion of the exam is performed with the patient in reverse Trendelenburg.  +--------+---------------+---------+-----------+----------+--------------------+ RIGHT   CompressibilityPhasicitySpontaneityPropertiesThrombus Aging       +--------+---------------+---------+-----------+----------+--------------------+ CFV     Full           Yes      Yes                                       +--------+---------------+---------+-----------+----------+--------------------+ SFJ     Full                                                              +--------+---------------+---------+-----------+----------+--------------------+ FV Prox Full           Yes      Yes                                       +--------+---------------+---------+-----------+----------+--------------------+ FV Mid  Full           Yes      Yes                                        +--------+---------------+---------+-----------+----------+--------------------+ FV      Full           Yes      Yes                                       Distal                                                                    +--------+---------------+---------+-----------+----------+--------------------+ PFV                    Yes      Yes                  patent by  color/doppler        +--------+---------------+---------+-----------+----------+--------------------+ POP     Full           Yes      Yes                                       +--------+---------------+---------+-----------+----------+--------------------+ PTV     Full                                                              +--------+---------------+---------+-----------+----------+--------------------+ PERO    Full                                                              +--------+---------------+---------+-----------+----------+--------------------+ hypoechoic cystic area noted in the thigh overlying the anterior aspect of the distal femur (6.75 x 1.48 x 2.79 cm).    Summary: RIGHT: - No evidence of common femoral vein obstruction.  LEFT: - There is no evidence of deep vein thrombosis in the lower extremity.  - No cystic structure found in the popliteal fossa. - Hypoechoic cystic area noted in the thigh overlying the anterior aspect of the distal femur (6.75 x 1.48 x 2.79 cm).  *See table(s) above for measurements and observations. Electronically signed by Lemar LivingsBrandon Cain MD on 09/23/2022 at 1:46:15 PM.    Final    ECHOCARDIOGRAM COMPLETE BUBBLE STUDY  Result Date: 09/22/2022    ECHOCARDIOGRAM REPORT   Patient Name:   Cheryl GuysBEATRICE R Goodman Date of Exam: 09/22/2022 Medical Rec #:  [id number]          Height:       59.0 in Accession #:    [id number]         Weight:       185.0 lb Date of Birth:  12/11/1940          BSA:           1.784 m Patient Age:    81 years           BP:           125/65 mmHg Patient Gender: F                  HR:           84 bpm. Exam Location:  Inpatient Procedure: 2D Echo, Cardiac Doppler, Color Doppler and Saline Contrast Bubble            Study Indications:    Stroke 434.91 / I63.9  History:        Patient has prior history of Echocardiogram examinations, most                 recent 08/02/2019. Risk Factors:Hypertension, Dyslipidemia and                 Sleep Apnea.  Sonographer:    Leta Junglingiffany Cooper RDCS Referring Phys: [id number] Deno LungerGEORGE J [facility] IMPRESSIONS  1. Left ventricular ejection fraction, by estimation, is 60 to 65%. The left ventricle has normal function.  The left ventricle has no regional wall motion abnormalities. Left ventricular diastolic parameters are consistent with Grade I diastolic dysfunction (impaired relaxation).  2. Right ventricular systolic function is low normal. The right ventricular size is normal.  3. The mitral valve is grossly normal. No evidence of mitral valve regurgitation. No evidence of mitral stenosis.  4. The aortic valve is grossly normal. Aortic valve regurgitation is not visualized. No aortic stenosis is present.  5. Cannot exclude a small PFO by color doppler, however, agitated saline contrast bubble study was negative for right to left shunt. Comparison(s): Changes from prior study are noted. 08/02/2019: LVEF 60-65%, severe RV dysfunction (Right heart strain). Conclusion(s)/Recommendation(s): No intracardiac source of embolism detected on this transthoracic study. Consider a transesophageal echocardiogram to exclude cardiac source of embolism if clinically indicated. FINDINGS  Left Ventricle: Left ventricular ejection fraction, by estimation, is 60 to 65%. The left ventricle has normal function. The left ventricle has no regional wall motion abnormalities. The left ventricular internal cavity size was normal in size. There is  no left ventricular hypertrophy. Left  ventricular diastolic parameters are consistent with Grade I diastolic dysfunction (impaired relaxation). Right Ventricle: The right ventricular size is normal. No increase in right ventricular wall thickness. Right ventricular systolic function is low normal. Left Atrium: Left atrial size was normal in size. Right Atrium: Right atrial size was normal in size. Pericardium: There is no evidence of pericardial effusion. Presence of epicardial fat layer. Mitral Valve: The mitral valve is grossly normal. No evidence of mitral valve regurgitation. No evidence of mitral valve stenosis. Tricuspid Valve: The tricuspid valve is grossly normal. Tricuspid valve regurgitation is not demonstrated. No evidence of tricuspid stenosis. Aortic Valve: The aortic valve is grossly normal. Aortic valve regurgitation is not visualized. No aortic stenosis is present. Pulmonic Valve: The pulmonic valve was grossly normal. Pulmonic valve regurgitation is trivial. No evidence of pulmonic stenosis. Aorta: The aortic root and ascending aorta are structurally normal, with no evidence of dilitation. IAS/Shunts: Cannot exclude a small PFO. Agitated saline contrast was given intravenously to evaluate for intracardiac shunting. Agitated saline contrast bubble study was negative, with no evidence of any interatrial shunt.  LEFT VENTRICLE PLAX 2D LVIDd:         3.90 cm   Diastology LVIDs:         2.00 cm   LV e' medial:    7.18 cm/s LV PW:         0.90 cm   LV E/e' medial:  13.1 LV IVS:        0.90 cm   LV e' lateral:   5.98 cm/s LVOT diam:     1.70 cm   LV E/e' lateral: 15.8 LV SV:         46 LV SV Index:   26 LVOT Area:     2.27 cm  RIGHT VENTRICLE RV S prime:     15.70 cm/s LEFT ATRIUM             Index        RIGHT ATRIUM           Index LA diam:        2.90 cm 1.63 cm/m   RA Area:     11.00 cm LA Vol (A2C):   60.7 ml 34.02 ml/m  RA Volume:   18.80 ml  10.54 ml/m LA Vol (A4C):   54.0 ml 30.26 ml/m LA Biplane Vol: 57.5 ml 32.23 ml/m  AORTIC  VALVE  LVOT Vmax:   120.00 cm/s LVOT Vmean:  64.500 cm/s LVOT VTI:    0.204 m  AORTA Ao Root diam: 3.30 cm Ao Asc diam:  3.20 cm MITRAL VALVE MV Area (PHT): 4.41 cm    SHUNTS MV Decel Time: 172 msec    Systemic VTI:  0.20 m MV E velocity: 94.30 cm/s  Systemic Diam: 1.70 cm MV A velocity: 81.40 cm/s MV E/A ratio:  1.16 Zoila ShutterKenneth Hilty MD Electronically signed by Zoila ShutterKenneth Hilty MD Signature Date/Time: 09/22/2022/2:36:53 PM    Final    CT ANGIO HEAD NECK W WO CM  Result Date: 09/21/2022 CLINICAL DATA:  Stroke suspected EXAM: CT ANGIOGRAPHY HEAD AND NECK TECHNIQUE: Multidetector CT imaging of the head and neck was performed using the standard protocol during bolus administration of intravenous contrast. Multiplanar CT image reconstructions and MIPs were obtained to evaluate the vascular anatomy. Carotid stenosis measurements (when applicable) are obtained utilizing NASCET criteria, using the distal internal carotid diameter as the denominator. RADIATION DOSE REDUCTION: This exam was performed according to the departmental dose-optimization program which includes automated exposure control, adjustment of the mA and/or kV according to patient size and/or use of iterative reconstruction technique. CONTRAST:  65mL OMNIPAQUE IOHEXOL 350 MG/ML SOLN COMPARISON:  Brain MRI 08/19/2022 common carotid Doppler 06/29/2017 FINDINGS: CT HEAD FINDINGS Brain: There is no acute intracranial hemorrhage, extra-axial fluid collection, or acute territorial infarct. Background parenchymal volume is normal for age. The ventricles are normal in size. Background chronic small-vessel ischemic change and remote infarct in the left caudate head/lentiform nucleus are unchanged. There is no mass lesion.  There is no mass effect or midline shift. Vascular: See below. Skull: Normal. Negative for fracture or focal lesion. Sinuses/Orbits: There is mucosal thickening in the left sphenoid sinus. A left lens implant is noted. The globes and orbits are  otherwise unremarkable. Other: None. Review of the MIP images confirms the above findings CTA NECK FINDINGS Aortic arch: The imaged aortic arch is normal. The origins of the major branch vessels are patent. The subclavian arteries are patent to the level imaged. Right carotid system: The right common, internal, and external carotid arteries are patent, without hemodynamically significant stenosis or occlusion. There is no dissection or aneurysm. Left carotid system: The left common, internal, and external carotid arteries are patent, without hemodynamically significant stenosis or occlusion. There is no dissection or aneurysm. Vertebral arteries: The vertebral arteries are patent, without hemodynamically significant stenosis or occlusion. There is no dissection or aneurysm. Skeleton: There is no acute osseous abnormality or suspicious osseous lesion. There is no visible canal hematoma. Other neck: The soft tissues of the neck are unremarkable. Upper chest: The imaged lung apices are clear. Review of the MIP images confirms the above findings CTA HEAD FINDINGS Anterior circulation: There is calcified plaque in the carotid siphons bilaterally resulting in mild-to-moderate stenosis on the right and no greater than mild stenosis on the left. The bilateral MCAs are patent, without proximal stenosis or occlusion. The bilateral ACAs are patent, without proximal stenosis or occlusion. The anterior communicating artery is normal. There is no aneurysm or AVM. Posterior circulation: The bilateral V4 segments are patent. The basilar artery is patent. The major cerebellar arteries appear patent. The bilateral PCAs are patent. There is focal severe stenosis of the left distal P2 segment (12-157). There is no other proximal stenosis or occlusion. A left posterior communicating artery is identified. There is no aneurysm or AVM. Venous sinuses: Patent. Anatomic variants: None. Review of the MIP images  confirms the above findings  IMPRESSION: 1. No acute intracranial pathology on initial noncontrast head CT. 2.  No emergent large vessel occlusion. 3. Focal severe stenosis of the left P2 segment. Otherwise, patent intracranial vasculature. 4. Patent vasculature of the neck with no hemodynamically significant stenosis or occlusion. Electronically Signed   By: Lesia Hausen M.D.   On: 09/21/2022 18:09   US RENAL  Result Date: 09/20/2022 CLINICAL DATA:  Renal failure. EXAM: RENAL / URINARY TRACT ULTRASOUND COMPLETE COMPARISON:  April 22, 2008. FINDINGS: Right Kidney: Renal measurements: 10.1 x 5.0 x 4.5 cm. Echogenicity within normal limits. No mass or hydronephrosis visualized. Left Kidney: Renal measurements: 9.1 x 4.7 x 4.4 cm . Echogenicity within normal limits. No mass or hydronephrosis visualized. Bladder: Appears normal for degree of bladder distention. Calculated prevoid volume of 50 mL. Other: None. IMPRESSION: Normal renal ultrasound. Electronically Signed   By: Lupita Raider M.D.   On: 09/20/2022 14:36   CT Angio Abd/Pel W and/or Wo Contrast  Result Date: 09/18/2022 CLINICAL DATA:  Lower GI bleed.  Blood in stool. EXAM: CTA ABDOMEN AND PELVIS WITHOUT AND WITH CONTRAST TECHNIQUE: Multidetector CT imaging of the abdomen and pelvis was performed using the standard protocol during bolus administration of intravenous contrast. Multiplanar reconstructed images and MIPs were obtained and reviewed to evaluate the vascular anatomy. RADIATION DOSE REDUCTION: This exam was performed according to the departmental dose-optimization program which includes automated exposure control, adjustment of the mA and/or kV according to patient size and/or use of iterative reconstruction technique. CONTRAST:  68mL OMNIPAQUE IOHEXOL 350 MG/ML SOLN COMPARISON:  CT abdomen pelvis 02/11/2021 FINDINGS: VASCULAR Aorta: Normal caliber aorta without aneurysm, dissection, vasculitis or significant stenosis. Celiac: Patent without evidence of aneurysm,  dissection, vasculitis or significant stenosis. A 1.2 cm peripherally calcified splenic artery aneurysm is noted, stable compared to 04/22/2008 (series 4, image 37). SMA: Patent without evidence of aneurysm, dissection, vasculitis or significant stenosis. Renals: Both renal arteries are patent without evidence of aneurysm, dissection, vasculitis, fibromuscular dysplasia or significant stenosis. IMA: Patent without evidence of aneurysm, dissection, vasculitis or significant stenosis. Inflow: Patent without evidence of aneurysm, dissection, vasculitis or significant stenosis. Proximal Outflow: Bilateral common femoral and visualized portions of the superficial and profunda femoral arteries are patent without evidence of aneurysm, dissection, vasculitis or significant stenosis. Veins: Patent. Review of the MIP images confirms the above findings. NON-VASCULAR Lower chest: No acute abnormality. Hepatobiliary: No focal liver abnormality is seen. Status post cholecystectomy. No biliary dilatation. Pancreas: Unremarkable. No pancreatic ductal dilatation or surrounding inflammatory changes. Spleen: Normal in size without focal abnormality. Adrenals/Urinary Tract: Adrenal glands are unremarkable. Kidneys are normal, without renal calculi, focal lesion, or hydronephrosis. Bladder is unremarkable. Stomach/Bowel: Partially visualized large hiatal hernia appears to involve the entire stomach, not included on the field of view. A 3.1 cm fluid and air-filled diverticulum is noted in the posterior aspect of the second portion of the duodenum (series 11, image 26). Diverticulosis coli. No evidence of bowel wall thickening, distention, or inflammatory changes. Lymphatic: Mild aortic bi-iliac atherosclerosis. No enlarged abdominal or pelvic lymph nodes. Reproductive: Status post hysterectomy. No adnexal masses. Other: No abdominal wall hernia or abnormality. No abdominopelvic ascites. Musculoskeletal: No acute or suspicious osseous  findings. Chronic compression fracture of the T12 vertebral body. IMPRESSION: VASCULAR 1. No evidence of active gastrointestinal bleeding. 2. Stable 1.2 cm splenic artery aneurysm. 3. Mild aorto bi-iliac atherosclerosis. Aortic Atherosclerosis (ICD10-I70.0). NON-VASCULAR 1. No acute abnormality in the abdomen or pelvis. 2. Large hiatal  hernia. 3. Diverticulosis coli.  No CT evidence of diverticulitis. Electronically Signed   By: Ileana Roup M.D.   On: 09/18/2022 16:01    Labs:  CBC: Recent Labs    09/23/22 0546 09/23/22 1858 09/24/22 0042 09/25/22 0753 09/26/22 0640  WBC 13.6*  --  13.4* 8.8 9.1  HGB 7.3* 9.1* 8.8* 9.4* 9.1*  HCT 25.0* 29.5* 28.8* 30.6* 30.4*  PLT 213  --  297 308 309    COAGS: Recent Labs    09/19/22 0722  INR 1.1  APTT 21*    BMP: Recent Labs    09/23/22 0546 09/24/22 0042 09/25/22 0753 09/26/22 0640  NA 140 136 142 141  K 4.1 3.7 3.7 3.6  CL 111 106 111 111  CO2 23 24 25 24   GLUCOSE 101* 103* 123* 104*  BUN 20 20 15 15   CALCIUM 8.1* 8.2* 8.6* 8.6*  CREATININE 1.21* 1.36* 1.16* 1.21*  GFRNONAA 45* 39* 47* 45*    LIVER FUNCTION TESTS: Recent Labs    09/19/22 0722 09/20/22 0749 09/21/22 0808 09/22/22 0732  BILITOT 0.8 0.7 0.7 0.7  AST 22 14* 14* 15  ALT 12 14 15 14   ALKPHOS 45 49 48 54  PROT 5.6* 5.7* 5.5* 5.6*  ALBUMIN 3.0* 3.1* 3.0* 3.0*    TUMOR MARKERS: No results for input(s): "AFPTM", "CEA", "CA199", "CHROMGRNA" in the last 8760 hours.  Assessment and Plan:   Patient has suffered a subacute osteoporotic fracture of the L3 vertebra.   History and exam have demonstrated the following:   Acute/subacute fracture on MRI of the lumbar spine dated 10/07/2022. Pain on exam concordant with level of fracture.  Failure of conservative therapy. Significant disability on the [place] with 18/24 positive symptoms, reflecting significant impact/impairment of activities of daily life (ADLs).   ICD-10-CM  Codes that Support Medical Necessity (BamBlog.de.aspx?articleId=[email or web address]) Select from appropriate groups(s)/all that apply:   M80.08XA Age-related osteoporosis with current pathological fracture, vertebra(e), initial encounter for fracture   Plan:  L3 vertebral body augmentation with balloon kyphoplasty  Post-procedure disposition: outpatient [facility]  Medication holds: ASA, Plavix  The patient has suffered a fracture of the L3 vertebral body. It is recommended that patients aged 6 years or older be evaluated for possible testing or treatment of osteoporosis. A copy of this consult report is sent to the patient's referring physician.     Total time spent on today's visit was over  40 Minutes, including both face-to-face time and non face-to-face time, personally spent on review of chart (including labs and relevant imaging), discussing further workup and treatment options, referral to specialist if needed, reviewing outside records if pertinent, answering patient questions, and coordinating care regarding Acute/subacute fracture on MRI of the lumbar spine dated 10/07/2022 as well as management strategy.    Electronically Signed: Antonietta Jewel Lucille Witts 10/12/2022, 11:29 AM

## 2022-10-13 DIAGNOSIS — M810 Age-related osteoporosis without current pathological fracture: Secondary | ICD-10-CM | POA: Diagnosis not present

## 2022-10-15 ENCOUNTER — Telehealth: Payer: Self-pay | Admitting: *Deleted

## 2022-10-15 ENCOUNTER — Encounter: Payer: Self-pay | Admitting: *Deleted

## 2022-10-15 NOTE — Patient Outreach (Signed)
  Care Coordination   Initial Visit Note   10/15/2022 Name: Cheryl Goodman MRN: [id number] DOB: 1941/09/10  Cheryl Goodman is a 82 y.o. year old female who sees Cheryl Aus, MD for primary care. I spoke with  Cheryl Goodman by phone today.  What matters to the patients health and wellness today?  State she has had a rough time the last several months. First a stroke, now recovered, then a GI bleed due to anticoagulants, and now fractured vertebrae causing pain.     Goals Addressed             This Visit's Progress    Relief of back pain (fracture vertebrae)       Care Coordination Interventions: Evaluation of current treatment plan related to fractured vertebrae and patient's adherence to plan as established by provider Advised patient to use DME to help decrease risk of fall.   Reviewed medications with patient and discussed affordability and adherence.  Discussed pain medication management Reviewed scheduled/upcoming provider appointments including ortho specialist on 1/16 and PCP on 1/17.  She is scheduled to receive injections in her back Screening for signs and symptoms of depression related to chronic disease state  Assessed social determinant of health barriers         SDOH assessments and interventions completed:  Yes  SDOH Interventions Today    Flowsheet Row Most Recent Value  SDOH Interventions   Food Insecurity Interventions Intervention Not Indicated  Housing Interventions Intervention Not Indicated  Transportation Interventions Intervention Not Indicated        Care Coordination Interventions:  Yes, provided   Follow up plan: Follow up call scheduled for 1/18    Encounter Outcome:  Pt. Visit Completed   Cheryl David, RN, MSN, [place] [facility] Care Management Coordinator [phone]

## 2022-10-19 ENCOUNTER — Ambulatory Visit
Admission: RE | Admit: 2022-10-19 | Discharge: 2022-10-19 | Disposition: A | Discharge status: Home / Self Care | Payer: Medicare HMO | Source: Ambulatory Visit | Attending: Internal Medicine | Admitting: Internal Medicine

## 2022-10-19 DIAGNOSIS — M8008XA Age-related osteoporosis with current pathological fracture, vertebra(e), initial encounter for fracture: Secondary | ICD-10-CM

## 2022-10-19 HISTORY — PX: IR KYPHO LUMBAR INC FX REDUCE BONE BX UNI/BIL CANNULATION INC/IMAGING: IMG5519

## 2022-10-19 MED ORDER — MIDAZOLAM HCL 2 MG/2ML IJ SOLN: 1.0000 mg | INTRAMUSCULAR | Status: DC | PRN

## 2022-10-19 MED ORDER — ACETAMINOPHEN 10 MG/ML IV SOLN
1000.0000 mg | Freq: Once | INTRAVENOUS | Status: AC
  Administered 2022-10-19: 1000 mg via INTRAVENOUS

## 2022-10-19 MED ORDER — VANCOMYCIN HCL IN DEXTROSE 1-5 GM/200ML-% IV SOLN
1000.0000 mg | INTRAVENOUS | Status: AC
  Administered 2022-10-19: 1000 mg via INTRAVENOUS

## 2022-10-19 MED ORDER — FENTANYL CITRATE (PF) 100 MCG/2ML IJ SOLN
INTRAMUSCULAR | Status: AC | PRN
  Administered 2022-10-19: 25 ug via INTRAVENOUS
  Administered 2022-10-19: 50 ug via INTRAVENOUS
  Administered 2022-10-19: 25 ug via INTRAVENOUS

## 2022-10-19 MED ORDER — FENTANYL CITRATE PF 50 MCG/ML IJ SOSY: 25.0000 ug | PREFILLED_SYRINGE | INTRAMUSCULAR | Status: DC | PRN

## 2022-10-19 MED ORDER — SODIUM CHLORIDE 0.9 % IV SOLN: INTRAVENOUS | Status: DC

## 2022-10-19 MED ORDER — MIDAZOLAM HCL 2 MG/2ML IJ SOLN
INTRAMUSCULAR | Status: AC | PRN
  Administered 2022-10-19: 1 mg via INTRAVENOUS
  Administered 2022-10-19: .5 mg via INTRAVENOUS
  Administered 2022-10-19: 1 mg via INTRAVENOUS

## 2022-10-19 NOTE — Discharge Instructions (Signed)
Kyphoplasty Post Procedure Discharge Instructions  May resume a regular diet and any medications that you routinely take (including pain medications). However, if you are taking Aspirin or an anticoagulant/blood thinner you will be told when you can resume taking these by the healthcare provider. No driving day of procedure. The day of your procedure take it easy. You may use an ice pack as needed to injection sites on back.  Ice to back 30 minutes on and 30 minutes off, as needed. May remove bandaids tomorrow after taking a shower. Replace daily with a clean bandaid until healed.  Do not lift anything heavier than a milk jug for 1-2 weeks or determined by your physician.  Follow up with your physician in 2 weeks.    Please contact our office at [phone] for the following symptoms or if you have any questions:  Fever greater than 100 degrees Increased swelling, pain, or redness at injection site. Increased back and/or leg pain New numbness or change in symptoms from before the procedure.    Thank you for visiting [facility].  May resume elliquis 24 hours after procedure.

## 2022-10-19 NOTE — Progress Notes (Signed)
Pt back in nursing recovery area. Pt still drowsy from procedure but will wake up when spoken to. Pt follows commands, talks in complete sentences and has no complaints at this time. Pt will remain in nursing station until discharge.  ?

## 2022-10-20 DIAGNOSIS — S32030G Wedge compression fracture of third lumbar vertebra, subsequent encounter for fracture with delayed healing: Secondary | ICD-10-CM | POA: Diagnosis not present

## 2022-10-20 DIAGNOSIS — N289 Disorder of kidney and ureter, unspecified: Secondary | ICD-10-CM | POA: Diagnosis not present

## 2022-10-20 DIAGNOSIS — M818 Other osteoporosis without current pathological fracture: Secondary | ICD-10-CM | POA: Diagnosis not present

## 2022-10-20 DIAGNOSIS — K922 Gastrointestinal hemorrhage, unspecified: Secondary | ICD-10-CM | POA: Diagnosis not present

## 2022-10-20 DIAGNOSIS — D500 Iron deficiency anemia secondary to blood loss (chronic): Secondary | ICD-10-CM | POA: Diagnosis not present

## 2022-10-20 DIAGNOSIS — M810 Age-related osteoporosis without current pathological fracture: Secondary | ICD-10-CM | POA: Diagnosis not present

## 2022-10-20 DIAGNOSIS — I2699 Other pulmonary embolism without acute cor pulmonale: Secondary | ICD-10-CM | POA: Diagnosis not present

## 2022-10-21 ENCOUNTER — Ambulatory Visit: Payer: Self-pay | Admitting: *Deleted

## 2022-10-21 DIAGNOSIS — H59099 Other disorders of unspecified eye following cataract surgery: Secondary | ICD-10-CM | POA: Diagnosis not present

## 2022-10-21 DIAGNOSIS — H04129 Dry eye syndrome of unspecified lacrimal gland: Secondary | ICD-10-CM | POA: Diagnosis not present

## 2022-10-21 NOTE — Patient Outreach (Signed)
  Care Coordination   Follow Up Visit Note   10/21/2022 Name: Cheryl Goodman MRN: [id number] DOB: 1941-04-19  Cheryl Goodman is a 82 y.o. year old female who sees Cheryl Aus, MD for primary care. I spoke with  Cheryl Goodman by phone today.  What matters to the patients health and wellness today?  Continued relief of back pain post injection.     Goals Addressed             This Visit's Progress    COMPLETED: Relief of back pain (fracture vertebrae)   On track    Care Coordination Interventions: Evaluation of current treatment plan related to fractured vertebrae and patient's adherence to plan as established by provider Advised patient to use DME to help decrease risk of fall.   Reviewed medications with patient and discussed affordability and adherence.  Discussed pain medication management Reviewed scheduled/upcoming provider appointments including ortho specialist on 1/16 and PCP on 1/17.  She is scheduled to receive injections in her back Screening for signs and symptoms of depression related to chronic disease state  Assessed social determinant of health barriers  1/18 - Report pain is relieved with injection.  Will continue to follow up with PCP and Ortho as needed.          SDOH assessments and interventions completed:  No     Care Coordination Interventions:  Yes, provided   Follow up plan: No further intervention required.   Encounter Outcome:  Pt. Visit Completed   Cheryl David, RN, MSN, [place] Care Management Care Management Coordinator [phone]

## 2022-10-27 ENCOUNTER — Telehealth: Payer: Self-pay

## 2022-10-27 NOTE — Telephone Encounter (Signed)
Phone call to pt to follow up from her kyphoplasty on 10/19/22. Pt reports her pain is completely gone post procedure in her back but she is having some balance issues and pain in her right hip.  Pt denies any signs of infection, redness at the site, draining or fever. Pt has no complaints at this time and will be scheduled for a telephone follow up with Dr. Dwaine Gale next week. Pt advised to call back if anything were to change or any concerns arise and we will arrange an in person appointment. Pt verbalized understanding.

## 2022-11-03 DIAGNOSIS — M810 Age-related osteoporosis without current pathological fracture: Secondary | ICD-10-CM | POA: Diagnosis not present

## 2022-11-04 ENCOUNTER — Inpatient Hospital Stay
Admission: RE | Admit: 2022-11-04 | Discharge: 2022-11-04 | Disposition: A | Discharge status: Home / Self Care | Payer: Medicare HMO | Source: Ambulatory Visit | Attending: Interventional Radiology | Admitting: Interventional Radiology

## 2022-11-04 ENCOUNTER — Other Ambulatory Visit: Payer: Self-pay | Admitting: Internal Medicine

## 2022-11-04 ENCOUNTER — Ambulatory Visit
Admission: RE | Admit: 2022-11-04 | Discharge: 2022-11-04 | Disposition: A | Discharge status: Home / Self Care | Payer: Medicare HMO | Source: Ambulatory Visit | Attending: Internal Medicine | Admitting: Internal Medicine

## 2022-11-04 DIAGNOSIS — S32039A Unspecified fracture of third lumbar vertebra, initial encounter for closed fracture: Secondary | ICD-10-CM

## 2022-11-04 DIAGNOSIS — M8008XD Age-related osteoporosis with current pathological fracture, vertebra(e), subsequent encounter for fracture with routine healing: Secondary | ICD-10-CM | POA: Diagnosis not present

## 2022-11-04 HISTORY — PX: IR RADIOLOGIST EVAL & MGMT: IMG5224

## 2022-11-04 NOTE — Progress Notes (Signed)
Chief Complaint: Low back pain due to L3 compression fracture  Referring Physician(s): Miller,Mark F  History of Present Illness: Cheryl Goodman is a 82 y.o. female who on December 24th developed sudden onset back pain and felt a pop while twisting around to reach around behind her.  Over the coming days, her pain got increasingly worse to the point that it was excruciating and she could barely get out of bed without crying.  MRI showed acute/subacute L3 superior end plate compression fracture.  Her pain remained severe despite conservative therapy.  She underwent L3 kyphoplasty with me on 10/19/2022.  Her pain has nearly completely resolved since the procedure.  She does have mild continued hip pain.  She has has been able to ambulate without her walker.  Past Medical History:  Diagnosis Date   Acute pulmonary embolism with acute cor pulmonale ([place]) 08/10/2019   Anemia    Arthritis    Dysphagia    GERD (gastroesophageal reflux disease)    History of hiatal hernia    History of kidney stones    x2 ; passed independently   Hyperlipidemia    Hypertension    Nausea    OA (osteoarthritis) of knee 10/31/2017   Sleep apnea    Stroke [facility])     Past Surgical History:  Procedure Laterality Date   ABDOMINAL HYSTERECTOMY     APPENDECTOMY     BREAST BIOPSY Right    BREAST SURGERY     CARPAL TUNNEL RELEASE     CHOLECYSTECTOMY     COLONOSCOPY WITH PROPOFOL N/A 09/01/2015   Procedure: COLONOSCOPY WITH PROPOFOL;  Surgeon: Manya Silvas, MD;  Location: [facility];  Service: Endoscopy;  Laterality: N/A;   COLONOSCOPY WITH PROPOFOL N/A 04/29/2021   Procedure: COLONOSCOPY WITH PROPOFOL;  Surgeon: Robert Bellow, MD;  Location: [facility] ENDOSCOPY;  Service: Gastroenterology;  Laterality: N/A;   ESOPHAGOGASTRODUODENOSCOPY N/A 07/17/2019   Procedure: ESOPHAGOGASTRODUODENOSCOPY (EGD);  Surgeon: Virgel Manifold, MD;  Location: [facility] ENDOSCOPY;  Service: Endoscopy;   Laterality: N/A;   ESOPHAGOGASTRODUODENOSCOPY (EGD) WITH PROPOFOL N/A 04/29/2021   Procedure: ESOPHAGOGASTRODUODENOSCOPY (EGD) WITH PROPOFOL;  Surgeon: Robert Bellow, MD;  Location: [facility] ENDOSCOPY;  Service: Gastroenterology;  Laterality: N/A;   ESOPHAGOGASTRODUODENOSCOPY (EGD) WITH PROPOFOL N/A 09/21/2022   Procedure: ESOPHAGOGASTRODUODENOSCOPY (EGD) WITH PROPOFOL;  Surgeon: Arta Silence, MD;  Location: [facility] ENDOSCOPY;  Service: Gastroenterology;  Laterality: N/A;   IR KYPHO LUMBAR INC FX REDUCE BONE BX UNI/BIL CANNULATION INC/IMAGING  10/19/2022   IR RADIOLOGIST EVAL & MGMT  11/04/2022   REDUCTION MAMMAPLASTY Bilateral    SAVORY DILATION N/A 09/01/2015   Procedure: SAVORY DILATION;  Surgeon: Manya Silvas, MD;  Location: [facility] ENDOSCOPY;  Service: Endoscopy;  Laterality: N/A;   TOTAL KNEE ARTHROPLASTY Right 10/31/2017   Procedure: RIGHT TOTAL KNEE ARTHROPLASTY;  Surgeon: Gaynelle Arabian, MD;  Location: [facility] ORS;  Service: Orthopedics;  Laterality: Right;    Allergies: Sulfa antibiotics, Dexlansoprazole, Ace inhibitors, Oxycodone, and Penicillins  Medications: Prior to Admission medications   Medication Sig Start Date End Date Taking? Authorizing Provider  acetaminophen (TYLENOL) 325 MG tablet Take 325-650 mg by mouth every 6 (six) hours as needed for headache or mild pain.    [provider]  acetaminophen-codeine (TYLENOL #3) 300-30 MG tablet Take 1 tablet by mouth every 4 (four) hours as needed for moderate pain.    [provider]  apixaban (ELIQUIS) 5 MG TABS tablet Take 1 tablet (5 mg total) by mouth 2 (two) times daily.  09/26/22 10/26/22  Amin, Jeanella Flattery, MD  Biotin 1000 MCG tablet Take 1,000 mcg by mouth at bedtime.    [provider]  Cholecalciferol (VITAMIN D3) 125 MCG (5000 UT) TABS Take 5,000 Units by mouth daily.    [provider]  docusate sodium (COLACE) 100 MG capsule Take 1 capsule (100 mg total) by mouth 2 (two) times daily as needed  for mild constipation or moderate constipation. 09/26/22   Amin, Jeanella Flattery, MD  NON FORMULARY Take 1 tablet by mouth See admin instructions. D-arginine tablets- Take 1 tablet by mouth at bedtime    [provider]  pantoprazole (PROTONIX) 40 MG tablet Take 1 tablet (40 mg total) by mouth 2 (two) times daily before a meal. 09/26/22 11/25/22  Amin, Jeanella Flattery, MD  polyethylene glycol (MIRALAX / GLYCOLAX) 17 g packet Take 17 g by mouth daily. 08/07/19   Eugenie Filler, MD  simvastatin (ZOCOR) 20 MG tablet Take 20 mg by mouth at bedtime.    [provider]  sucralfate (CARAFATE) 1 g tablet Take 1 g by mouth in the morning.    [provider]  torsemide (DEMADEX) 20 MG tablet Take 20 mg by mouth daily.    [provider]  traMADol (ULTRAM) 50 MG tablet Take 50 mg by mouth in the morning.    [provider]  triamterene-hydrochlorothiazide (DYAZIDE) 37.5-25 MG capsule Take 1 capsule by mouth daily.    [provider]     Family History  Problem Relation Age of Onset   Breast cancer Maternal Grandmother        in 26's   Heart disease Neg Hx     Social History   Socioeconomic History   Marital status: Married    Spouse name: Not on file   Number of children: Not on file   Years of education: Not on file   Highest education level: Not on file  Occupational History   Not on file  Tobacco Use   Smoking status: Never   Smokeless tobacco: Never  Vaping Use   Vaping Use: Never used  Substance and Sexual Activity   Alcohol use: No   Drug use: No   Sexual activity: Yes    Birth control/protection: None    Comment: intercourse age 20, less than 5 sexual partners  Other Topics Concern   Not on file  Social History Narrative   Not on file   Social Determinants of Health   Financial Resource Strain: Not on file  Food Insecurity: No Food Insecurity (10/15/2022)   Hunger Vital Sign    Worried About Running Out of Food in the Last  Year: Never true    Ran Out of Food in the Last Year: Never true  Transportation Needs: No Transportation Needs (10/15/2022)   PRAPARE - Hydrologist (Medical): No    Lack of Transportation (Non-Medical): No  Physical Activity: Not on file  Stress: Not on file  Social Connections: Not on file   Review of Systems  Review of Systems: A 12 point ROS discussed and pertinent positives are indicated in the HPI above.  All other systems are negative.  Advance Care Plan: The advanced care plan/surrogate decision maker was discussed at the time of visit and the patient did not wish to discuss or was not able to name a surrogate decision maker or provide an advance care plan.    Physical Exam No direct physical exam was performed (except  for noted visual exam findings with Video Visits).  Vital Signs: There were no vitals taken for this visit.  Imaging: IR Radiologist Eval & Mgmt  Result Date: 11/04/2022 EXAM: FOLLOW UP OFFICE VISIT CHIEF COMPLAINT: Low back pain due to L3 compression fracture HISTORY OF PRESENT ILLNESS: Past Medical History: Please see Epic note Medications: Please see Epic note Allergies: Please see Epic note Social History: Please see Epic note Family History:  Please see Epic note REVIEW OF SYSTEMS: Please see Epic note PHYSICAL EXAMINATION: Please see Epic note ASSESSMENT AND PLAN: Please see Epic note Electronically Signed   By: Acquanetta Belling M.D.   On: 11/04/2022 17:29   IR KYPHO LUMBAR INC FX REDUCE BONE BX UNI/BIL CANNULATION INC/IMAGING  Result Date: 10/19/2022 INDICATION: 82 year old woman with intractable back pain due to subacute osteoporotic L3 vertebral body compression fracture presents to IR for kyphoplasty EXAM: L3 kyphoplasty COMPARISON:  None Available. MEDICATIONS: As antibiotic prophylaxis, Vancomycin 1 gm IV was ordered pre-procedure and administered intravenously within 1 hour of incision. All current medications are in the EMR and  have been reviewed as part of this encounter. ANESTHESIA/SEDATION: Moderate (conscious) sedation was employed during this procedure. A total of Versed 2.5 mg and Fentanyl 100 mcg was administered intravenously by the radiology nurse. Total intra-service moderate Sedation Time: 41 minutes. The patient's level of consciousness and vital signs were monitored continuously by radiology nursing throughout the procedure under my direct supervision. FLUOROSCOPY: Radiation Exposure Index (as provided by the fluoroscopic device): 84 mGy Kerma COMPLICATIONS: None immediate. PROCEDURE: Following a full explanation of the procedure along with the potential associated complications, an informed witnessed consent was obtained. Patient positioned prone on the angiography table. Lower back skin prepped and draped in usual sterile fashion. All elements of maximal sterile barrier were utilized including, cap, mask, sterile gown, sterile gloves, large sterile drape, hand scrubbing and 2% Chlorhexidine for skin cleaning. The L3 vertebral body was localized under fluoroscopy. Intermittent fluoroscopy was used for guidance throughout the procedure. Multiple spot films of the lumbar spine were also obtained in the AP and lateral projections during the procedure. Right transpedicular cannulation of the vertebral body was performed with 10 ga Osteo-introducer needle. Fluoroscopy showed satisfactory positioning of equipment within the minimally compressed vertebral body with no displaced fracture fragments. Partial fracture reduction and bony cavity creation was then performed using an inflatable bone tamp. The bone tamp was removed, and vertebral augmentation was performed to careful application of 3 mL of firm consistency bone cement under lateral fluoroscopic guidance. Given symmetric vertebral body fill, contralateral transpedicular approach was not performed. The cannula was removed and hemostasis achieved with manual compression.  IMPRESSION: L3 kyphoplasty. If the patient has known osteoporosis, recommend treatment as clinically indicated. If the patient's bone density status is unknown, DEXA scan is recommended. Electronically Signed   By: Acquanetta Belling M.D.   On: 10/19/2022 11:38   DG Radiologist Eval And Mgmt  Result Date: 10/12/2022 EXAM: NEW PATIENT OFFICE VISIT CHIEF COMPLAINT: SEE EPIC NOTE HISTORY OF PRESENT ILLNESS: SEE EPIC NOTE REVIEW OF SYSTEMS: SEE EPIC NOTE PHYSICAL EXAMINATION: SEE EPIC NOTE ASSESSMENT AND PLAN: SEE EPIC NOTE Electronically Signed   By: Malachy Moan M.D.   On: 10/12/2022 10:58   MR LUMBAR SPINE WO CONTRAST  Result Date: 10/07/2022 CLINICAL DATA:  Low back pain with bilateral leg pain EXAM: MRI LUMBAR SPINE WITHOUT CONTRAST TECHNIQUE: Multiplanar, multisequence MR imaging of the lumbar spine was performed. No intravenous contrast was administered. COMPARISON:  MRI 01/21/2022 FINDINGS: Segmentation:  Standard. Alignment: Grade 1 anterolisthesis of L4 on L5 and L5 on S1. Focal kyphosis at the T11-T12 level related to remote T12 compression deformity. Vertebrae: Acute or subacute mild inferior endplate compression fracture of the L3 vertebral body with approximately 15% vertebral body height loss. Mild marrow edema. No retropulsion. No evidence of fracture extension into the posterior elements. Chronic T12 superior endplate compression deformity. No evidence of discitis. No suspicious bone lesion. Conus medullaris and cauda equina: Conus extends to the L1-L2 level. Conus and cauda equina appear normal. Paraspinal and other soft tissues: Negative. Disc levels: T11-T12: Unchanged impress upon the thecal sac with mild canal stenosis secondary to retropulsion of the T12 superior endplate. T12-L1: No significant disc protrusion, foraminal stenosis, or canal stenosis. L1-L2: Minimal right paracentral disc protrusion. No foraminal or canal stenosis. Unchanged. L2-L3: Minimal disc bulge with mild bilateral facet  arthropathy. No foraminal or canal stenosis. Unchanged. L3-L4: Minimal disc bulge. Mild bilateral facet arthropathy. No significant foraminal stenosis. No canal stenosis. Unchanged. L4-L5: Anterolisthesis with mild disc uncovering. Advanced bilateral facet arthropathy. Mild bilateral subarticular recess stenosis. No significant canal stenosis. Mild bilateral foraminal stenosis is more pronounced on the right. Unchanged. L5-S1: Minimal disc uncovering and mild disc bulge. Right greater than left facet arthropathy. No foraminal or canal stenosis. Unchanged. IMPRESSION: 1. Acute or subacute mild inferior endplate compression fracture of the L3 vertebral body with approximately 15% vertebral body height loss. No retropulsion. 2. Chronic T12 superior endplate compression deformity. 3. Mild multilevel degenerative changes of the lumbar spine are not significantly changed compared to the prior MRI. Mild bilateral subarticular recess stenosis and mild bilateral foraminal stenosis at L4-5. No significant canal stenosis at any level. These results will be called to the ordering clinician or representative by the Radiologist Assistant, and communication documented in the PACS or [employer]. Electronically Signed   By: Duanne Guess D.O.   On: 10/07/2022 10:46    Labs:  CBC: Recent Labs    09/23/22 0546 09/23/22 1858 09/24/22 0042 09/25/22 0753 09/26/22 0640  WBC 13.6*  --  13.4* 8.8 9.1  HGB 7.3* 9.1* 8.8* 9.4* 9.1*  HCT 25.0* 29.5* 28.8* 30.6* 30.4*  PLT 213  --  297 308 309    COAGS: Recent Labs    09/19/22 0722  INR 1.1  APTT 21*    BMP: Recent Labs    09/23/22 0546 09/24/22 0042 09/25/22 0753 09/26/22 0640  NA 140 136 142 141  K 4.1 3.7 3.7 3.6  CL 111 106 111 111  CO2 23 24 25 24   GLUCOSE 101* 103* 123* 104*  BUN 20 20 15 15   CALCIUM 8.1* 8.2* 8.6* 8.6*  CREATININE 1.21* 1.36* 1.16* 1.21*  GFRNONAA 45* 39* 47* 45*    LIVER FUNCTION TESTS: Recent Labs    09/19/22 0722  09/20/22 0749 09/21/22 0808 09/22/22 0732  BILITOT 0.8 0.7 0.7 0.7  AST 22 14* 14* 15  ALT 12 14 15 14   ALKPHOS 45 49 48 54  PROT 5.6* 5.7* 5.5* 5.6*  ALBUMIN 3.0* 3.1* 3.0* 3.0*    TUMOR MARKERS: No results for input(s): "AFPTM", "CEA", "CA199", "CHROMGRNA" in the last 8760 hours.  Assessment and Plan:  82 year old woman with intractable back pain due to acute/subacute osteoporotic L3 compression fracture was treated with Kyphoplasty on 10/19/2022.  Her pain has significantly improvement since the procedure and she is very please with the results.   She has an upcoming appointment with Dr.  Miller and I encouraged her to discuss her hip pain at her appointment.  She is also starting therapy for Osteoporosis, which I reiterated to her will be essential in avoiding future fractures.  Thank you for this interesting consult.  I greatly enjoyed meeting MADELYNN MALSON and look forward to participating in their care.  A copy of this report was sent to the requesting provider on this date.  Electronically Signed: Paula Libra Chantea Surace 11/04/2022, 5:34 PM   I spent a total of    15 Minutes in remote  clinical consultation, greater than 50% of which was counseling/coordinating care for L3 compression fracture.    Visit type: Audio only (telephone). Audio (no video) only due to technical limitations. Alternative for in-person consultation at [facility], [address], [place], [place]. This visit type was conducted due to national recommendations for restrictions regarding the COVID-19 Pandemic (e.g. social distancing).  This format is felt to be most appropriate for this patient at this time.  All issues noted in this document were discussed and addressed.

## 2022-12-21 DIAGNOSIS — N289 Disorder of kidney and ureter, unspecified: Secondary | ICD-10-CM | POA: Diagnosis not present

## 2022-12-21 DIAGNOSIS — K2900 Acute gastritis without bleeding: Secondary | ICD-10-CM | POA: Diagnosis not present

## 2022-12-21 DIAGNOSIS — I2699 Other pulmonary embolism without acute cor pulmonale: Secondary | ICD-10-CM | POA: Diagnosis not present

## 2022-12-21 DIAGNOSIS — D6859 Other primary thrombophilia: Secondary | ICD-10-CM | POA: Diagnosis not present

## 2022-12-21 DIAGNOSIS — M1711 Unilateral primary osteoarthritis, right knee: Secondary | ICD-10-CM | POA: Diagnosis not present

## 2022-12-21 DIAGNOSIS — Z8611 Personal history of tuberculosis: Secondary | ICD-10-CM | POA: Diagnosis not present

## 2022-12-21 DIAGNOSIS — M1712 Unilateral primary osteoarthritis, left knee: Secondary | ICD-10-CM | POA: Diagnosis not present

## 2022-12-27 ENCOUNTER — Encounter (HOSPITAL_COMMUNITY): Payer: Self-pay | Admitting: *Deleted

## 2022-12-27 ENCOUNTER — Other Ambulatory Visit: Payer: Self-pay

## 2022-12-27 ENCOUNTER — Observation Stay (HOSPITAL_COMMUNITY)
Admission: EM | Admit: 2022-12-27 | Discharge: 2022-12-29 | Disposition: A | Discharge status: Home / Self Care | Payer: Medicare HMO | Attending: Family Medicine | Admitting: Family Medicine

## 2022-12-27 DIAGNOSIS — D62 Acute posthemorrhagic anemia: Secondary | ICD-10-CM | POA: Insufficient documentation

## 2022-12-27 DIAGNOSIS — K921 Melena: Secondary | ICD-10-CM | POA: Diagnosis not present

## 2022-12-27 DIAGNOSIS — K317 Polyp of stomach and duodenum: Secondary | ICD-10-CM | POA: Diagnosis not present

## 2022-12-27 DIAGNOSIS — R5383 Other fatigue: Secondary | ICD-10-CM | POA: Diagnosis present

## 2022-12-27 DIAGNOSIS — K449 Diaphragmatic hernia without obstruction or gangrene: Secondary | ICD-10-CM | POA: Diagnosis not present

## 2022-12-27 DIAGNOSIS — Z8673 Personal history of transient ischemic attack (TIA), and cerebral infarction without residual deficits: Secondary | ICD-10-CM | POA: Diagnosis not present

## 2022-12-27 DIAGNOSIS — Z86711 Personal history of pulmonary embolism: Secondary | ICD-10-CM | POA: Insufficient documentation

## 2022-12-27 DIAGNOSIS — I10 Essential (primary) hypertension: Secondary | ICD-10-CM | POA: Diagnosis not present

## 2022-12-27 DIAGNOSIS — Z96651 Presence of right artificial knee joint: Secondary | ICD-10-CM | POA: Insufficient documentation

## 2022-12-27 DIAGNOSIS — D500 Iron deficiency anemia secondary to blood loss (chronic): Secondary | ICD-10-CM | POA: Diagnosis not present

## 2022-12-27 DIAGNOSIS — Z79899 Other long term (current) drug therapy: Secondary | ICD-10-CM | POA: Diagnosis not present

## 2022-12-27 DIAGNOSIS — E785 Hyperlipidemia, unspecified: Secondary | ICD-10-CM | POA: Diagnosis present

## 2022-12-27 DIAGNOSIS — K922 Gastrointestinal hemorrhage, unspecified: Principal | ICD-10-CM | POA: Diagnosis present

## 2022-12-27 DIAGNOSIS — E782 Mixed hyperlipidemia: Secondary | ICD-10-CM | POA: Diagnosis present

## 2022-12-27 DIAGNOSIS — K219 Gastro-esophageal reflux disease without esophagitis: Secondary | ICD-10-CM | POA: Diagnosis present

## 2022-12-27 LAB — COMPREHENSIVE METABOLIC PANEL
ALT: 16 U/L (ref 0–44)
AST: 15 U/L (ref 15–41)
Albumin: 3.5 g/dL (ref 3.5–5.0)
Alkaline Phosphatase: 62 U/L (ref 38–126)
Anion gap: 6 (ref 5–15)
BUN: 34 mg/dL — ABNORMAL HIGH (ref 8–23)
CO2: 23 mmol/L (ref 22–32)
Calcium: 8.5 mg/dL — ABNORMAL LOW (ref 8.9–10.3)
Chloride: 108 mmol/L (ref 98–111)
Creatinine, Ser: 1.24 mg/dL — ABNORMAL HIGH (ref 0.44–1.00)
GFR, Estimated: 43 mL/min — ABNORMAL LOW (ref >60)
Glucose, Bld: 135 mg/dL — ABNORMAL HIGH (ref 70–99)
Potassium: 3.7 mmol/L (ref 3.5–5.1)
Sodium: 137 mmol/L (ref 135–145)
Total Bilirubin: 1.1 mg/dL (ref 0.3–1.2)
Total Protein: 6.3 g/dL — ABNORMAL LOW (ref 6.5–8.1)

## 2022-12-27 LAB — IRON AND TIBC
Iron: 42 ug/dL (ref 28–170)
Saturation Ratios: 9 % — ABNORMAL LOW (ref 10.4–31.8)
TIBC: 459 ug/dL — ABNORMAL HIGH (ref 250–450)
UIBC: 417 ug/dL

## 2022-12-27 LAB — CBC
HCT: 38 % (ref 36.0–46.0)
Hemoglobin: 11.7 g/dL — ABNORMAL LOW (ref 12.0–15.0)
MCH: 27.3 pg (ref 26.0–34.0)
MCHC: 30.8 g/dL (ref 30.0–36.0)
MCV: 88.8 fL (ref 80.0–100.0)
Platelets: 267 10*3/uL (ref 150–400)
RBC: 4.28 MIL/uL (ref 3.87–5.11)
RDW: 14.5 % (ref 11.5–15.5)
WBC: 16 10*3/uL — ABNORMAL HIGH (ref 4.0–10.5)
nRBC: 0 % (ref 0.0–0.2)

## 2022-12-27 LAB — FERRITIN: Ferritin: 14 ng/mL (ref 11–307)

## 2022-12-27 LAB — TYPE AND SCREEN
ABO/RH(D): B NEG
Antibody Screen: NEGATIVE

## 2022-12-27 MED ORDER — TORSEMIDE 20 MG PO TABS: 20.0000 mg | ORAL_TABLET | Freq: Every day | ORAL | Status: DC

## 2022-12-27 MED ORDER — ACETAMINOPHEN 650 MG RE SUPP: 650.0000 mg | Freq: Four times a day (QID) | RECTAL | Status: DC | PRN

## 2022-12-27 MED ORDER — PANTOPRAZOLE SODIUM 40 MG IV SOLR
40.0000 mg | Freq: Two times a day (BID) | INTRAVENOUS | Status: DC
  Administered 2022-12-27 – 2022-12-29 (×4): 40 mg via INTRAVENOUS
  Filled 2022-12-27 (×4): qty 10

## 2022-12-27 MED ORDER — DOCUSATE SODIUM 100 MG PO CAPS: 100.0000 mg | ORAL_CAPSULE | Freq: Two times a day (BID) | ORAL | Status: DC | PRN

## 2022-12-27 MED ORDER — ONDANSETRON HCL 4 MG PO TABS: 4.0000 mg | ORAL_TABLET | Freq: Four times a day (QID) | ORAL | Status: DC | PRN

## 2022-12-27 MED ORDER — SIMVASTATIN 20 MG PO TABS
20.0000 mg | ORAL_TABLET | Freq: Every day | ORAL | Status: DC
  Administered 2022-12-27 – 2022-12-28 (×2): 20 mg via ORAL
  Filled 2022-12-27 (×2): qty 1

## 2022-12-27 MED ORDER — SODIUM CHLORIDE 0.9 % IV SOLN: INTRAVENOUS | Status: DC

## 2022-12-27 MED ORDER — PANTOPRAZOLE 80MG IVPB - SIMPLE MED
80.0000 mg | Freq: Once | INTRAVENOUS | Status: AC
  Administered 2022-12-27: 80 mg via INTRAVENOUS
  Filled 2022-12-27: qty 80

## 2022-12-27 MED ORDER — TRIAMTERENE-HCTZ 37.5-25 MG PO CAPS
1.0000 | ORAL_CAPSULE | Freq: Every day | ORAL | Status: DC
  Filled 2022-12-27: qty 1

## 2022-12-27 MED ORDER — ACETAMINOPHEN 325 MG PO TABS: 650.0000 mg | ORAL_TABLET | Freq: Four times a day (QID) | ORAL | Status: DC | PRN

## 2022-12-27 MED ORDER — GUAIFENESIN-DM 100-10 MG/5ML PO SYRP: 5.0000 mL | ORAL_SOLUTION | ORAL | Status: DC | PRN

## 2022-12-27 MED ORDER — ONDANSETRON HCL 4 MG/2ML IJ SOLN: 4.0000 mg | Freq: Four times a day (QID) | INTRAMUSCULAR | Status: DC | PRN

## 2022-12-27 MED ORDER — DOCUSATE SODIUM 100 MG PO CAPS
100.0000 mg | ORAL_CAPSULE | Freq: Two times a day (BID) | ORAL | Status: DC
  Administered 2022-12-27 – 2022-12-28 (×2): 100 mg via ORAL
  Filled 2022-12-27 (×3): qty 1

## 2022-12-27 NOTE — ED Triage Notes (Signed)
4-5 days of dark tarry stools now "dark, dark brown". PCP told her to come to the ED. Weakness in general since stools started.

## 2022-12-27 NOTE — Consult Note (Signed)
[facility] Consult  Referring Provider: No ref. provider found Primary Care Physician:  [name], MD Primary Gastroenterologist: Dr. Paulita Fujita  Reason for Consultation: Melena, anemia, fatigue  SUBJECTIVE:   HPI: Cheryl Goodman is a 82 y.o. female with past medical history of provoked pulmonary embolism in 2020 (currently on Eliquis), gastroesophageal reflux disease, hypertension, hyperlipidemia, sleep apnea. She presented to [facility] ER on 12/27/22 with 4-5 day history of worsening fatigue. She noted having melanic stool few days prior which has since become dark brown in color. She denied abdominal pain, though did endorse pain on palpitation in her epigastric region. Her last dose of Eliquis was 12/27/22. She denied chest pain and shortness of breath, did report having some cough.   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 Patient noted that she received iron infusions during this prior hospital stay, has not been receiving iron infusion in outpatient setting. She is not on iron therapy. She took pantoprazole twice per day after her recent hospital stay, is now taking this once per day. She reported a history of H.Pylori (none that I see in 2020 endoscopy reports). She denied NSAID use.   Labs on presentation showed Hgb 11.7 (was 12.6 on 12/21/22 and 9.1 on 09/26/22), WBC 16, PLT 267, Na 137, K 3.7, BUN/Cr 34/1.24, AST/ALT 15/16, ALP 62, total bilirubin 1.1. No abdominal imaging completed.   Family (patient's spouse and adult daughter) present at bedside, answered questions to best of my ability.  Past Medical History:  Diagnosis Date   Acute pulmonary embolism with acute cor pulmonale ([place]) 08/10/2019   Anemia    Arthritis    Dysphagia    GERD (gastroesophageal reflux disease)    History of hiatal hernia    History of kidney stones     x2 ; passed independently   Hyperlipidemia    Hypertension    Nausea    OA (osteoarthritis) of knee 10/31/2017   Sleep apnea    Stroke [facility])    Past Surgical History:  Procedure Laterality Date   ABDOMINAL HYSTERECTOMY     APPENDECTOMY     BREAST BIOPSY Right    BREAST SURGERY     CARPAL TUNNEL RELEASE     CHOLECYSTECTOMY     COLONOSCOPY WITH PROPOFOL N/A 09/01/2015   Procedure: COLONOSCOPY WITH PROPOFOL;  Surgeon: Manya Silvas, MD;  Location: [place];  Service: Endoscopy;  Laterality: N/A;   COLONOSCOPY WITH PROPOFOL N/A 04/29/2021   Procedure: COLONOSCOPY WITH PROPOFOL;  Surgeon: Robert Bellow, MD;  Location: [facility] ENDOSCOPY;  Service: Gastroenterology;  Laterality: N/A;   ESOPHAGOGASTRODUODENOSCOPY N/A 07/17/2019   Procedure: ESOPHAGOGASTRODUODENOSCOPY (EGD);  Surgeon: Virgel Manifold, MD;  Location: [facility] ENDOSCOPY;  Service: Endoscopy;  Laterality: N/A;   ESOPHAGOGASTRODUODENOSCOPY (EGD) WITH PROPOFOL N/A 04/29/2021   Procedure: ESOPHAGOGASTRODUODENOSCOPY (EGD) WITH PROPOFOL;  Surgeon: Robert Bellow, MD;  Location: [facility] ENDOSCOPY;  Service: Gastroenterology;  Laterality: N/A;   ESOPHAGOGASTRODUODENOSCOPY (EGD) WITH PROPOFOL N/A 09/21/2022   Procedure: ESOPHAGOGASTRODUODENOSCOPY (EGD) WITH PROPOFOL;  Surgeon: Arta Silence, MD;  Location: [facility] ENDOSCOPY;  Service: Gastroenterology;  Laterality: N/A;   IR KYPHO LUMBAR INC FX REDUCE BONE BX UNI/BIL CANNULATION INC/IMAGING  10/19/2022   IR RADIOLOGIST EVAL & MGMT  11/04/2022   REDUCTION MAMMAPLASTY Bilateral    SAVORY DILATION N/A 09/01/2015   Procedure: SAVORY DILATION;  Surgeon: Manya Silvas, MD;  Location: [facility]  ENDOSCOPY;  Service: Endoscopy;  Laterality: N/A;   TOTAL KNEE ARTHROPLASTY Right 10/31/2017   Procedure: RIGHT TOTAL KNEE ARTHROPLASTY;  Surgeon: Gaynelle Arabian, MD;  Location: [facility] ORS;  Service: Orthopedics;  Laterality: Right;   Prior to Admission medications   Medication Sig Start Date End  Date Taking? Authorizing Provider  acetaminophen (TYLENOL) 325 MG tablet Take 325-650 mg by mouth every 6 (six) hours as needed for headache or mild pain.    [provider]  acetaminophen-codeine (TYLENOL #3) 300-30 MG tablet Take 1 tablet by mouth every 4 (four) hours as needed for moderate pain.    [provider]  apixaban (ELIQUIS) 5 MG TABS tablet Take 1 tablet (5 mg total) by mouth 2 (two) times daily. 09/26/22 10/26/22  Amin, Jeanella Flattery, MD  Biotin 1000 MCG tablet Take 1,000 mcg by mouth at bedtime.    [provider]  Cholecalciferol (VITAMIN D3) 125 MCG (5000 UT) TABS Take 5,000 Units by mouth daily.    [provider]  docusate sodium (COLACE) 100 MG capsule Take 1 capsule (100 mg total) by mouth 2 (two) times daily as needed for mild constipation or moderate constipation. 09/26/22   Amin, Jeanella Flattery, MD  NON FORMULARY Take 1 tablet by mouth See admin instructions. D-arginine tablets- Take 1 tablet by mouth at bedtime    [provider]  pantoprazole (PROTONIX) 40 MG tablet Take 1 tablet (40 mg total) by mouth 2 (two) times daily before a meal. 09/26/22 11/25/22  Amin, Jeanella Flattery, MD  polyethylene glycol (MIRALAX / GLYCOLAX) 17 g packet Take 17 g by mouth daily. 08/07/19   Eugenie Filler, MD  simvastatin (ZOCOR) 20 MG tablet Take 20 mg by mouth at bedtime.    [provider]  sucralfate (CARAFATE) 1 g tablet Take 1 g by mouth in the morning.    [provider]  torsemide (DEMADEX) 20 MG tablet Take 20 mg by mouth daily.    [provider]  traMADol (ULTRAM) 50 MG tablet Take 50 mg by mouth in the morning.    [provider]  triamterene-hydrochlorothiazide (DYAZIDE) 37.5-25 MG capsule Take 1 capsule by mouth daily.    [provider]   Current Facility-Administered Medications  Medication Dose Route Frequency Provider Last Rate Last Admin   0.9 %  sodium chloride infusion   Intravenous  Continuous Duard Brady, MD       acetaminophen (TYLENOL) tablet 650 mg  650 mg Oral Q6H PRN Duard Brady, MD       Or   acetaminophen (TYLENOL) suppository 650 mg  650 mg Rectal Q6H PRN Duard Brady, MD       docusate sodium (COLACE) capsule 100 mg  100 mg Oral BID PRN Duard Brady, MD       docusate sodium (COLACE) capsule 100 mg  100 mg Oral BID Duard Brady, MD       ondansetron (ZOFRAN) tablet 4 mg  4 mg Oral Q6H PRN Duard Brady, MD       Or   ondansetron (ZOFRAN) injection 4 mg  4 mg Intravenous Q6H PRN Duard Brady, MD       pantoprazole (PROTONIX) 80 mg /NS 100 mL IVPB  80 mg Intravenous Once Nanavati, Ankit, MD       pantoprazole (PROTONIX) injection 40 mg  40 mg Intravenous Q12H Khatri, Pardeep, MD       simvastatin (ZOCOR) tablet 20 mg  20 mg Oral QHS Duard Brady, MD  torsemide (DEMADEX) tablet 20 mg  20 mg Oral Daily Duard Brady, MD       triamterene-hydrochlorothiazide (DYAZIDE) 37.5-25 MG per capsule 1 capsule  1 capsule Oral Daily Duard Brady, MD       Current Outpatient Medications  Medication Sig Dispense Refill   acetaminophen (TYLENOL) 325 MG tablet Take 325-650 mg by mouth every 6 (six) hours as needed for headache or mild pain.     acetaminophen-codeine (TYLENOL #3) 300-30 MG tablet Take 1 tablet by mouth every 4 (four) hours as needed for moderate pain.     apixaban (ELIQUIS) 5 MG TABS tablet Take 1 tablet (5 mg total) by mouth 2 (two) times daily. 60 tablet 0   Biotin 1000 MCG tablet Take 1,000 mcg by mouth at bedtime.     Cholecalciferol (VITAMIN D3) 125 MCG (5000 UT) TABS Take 5,000 Units by mouth daily.     docusate sodium (COLACE) 100 MG capsule Take 1 capsule (100 mg total) by mouth 2 (two) times daily as needed for mild constipation or moderate constipation. 30 capsule 0   NON FORMULARY Take 1 tablet by mouth See admin instructions. D-arginine tablets- Take 1 tablet by mouth at bedtime     pantoprazole (PROTONIX) 40 MG tablet  Take 1 tablet (40 mg total) by mouth 2 (two) times daily before a meal. 60 tablet 1   polyethylene glycol (MIRALAX / GLYCOLAX) 17 g packet Take 17 g by mouth daily. 30 each 0   simvastatin (ZOCOR) 20 MG tablet Take 20 mg by mouth at bedtime.     sucralfate (CARAFATE) 1 g tablet Take 1 g by mouth in the morning.     torsemide (DEMADEX) 20 MG tablet Take 20 mg by mouth daily.     traMADol (ULTRAM) 50 MG tablet Take 50 mg by mouth in the morning.     triamterene-hydrochlorothiazide (DYAZIDE) 37.5-25 MG capsule Take 1 capsule by mouth daily.     Allergies as of 12/27/2022 - Review Complete 12/27/2022  Allergen Reaction Noted   Sulfa antibiotics Anaphylaxis and Swelling 08/14/2015   Dexlansoprazole Other (See Comments) 08/14/2015   Ace inhibitors Cough 11/05/2015   Oxycodone Nausea Only 12/19/2017   Penicillins Rash 08/14/2015   Family History  Problem Relation Age of Onset   Breast cancer Maternal Grandmother        in 33's   Heart disease Neg Hx    Social History   Socioeconomic History   Marital status: Married    Spouse name: Not on file   Number of children: Not on file   Years of education: Not on file   Highest education level: Not on file  Occupational History   Not on file  Tobacco Use   Smoking status: Never   Smokeless tobacco: Never  Vaping Use   Vaping Use: Never used  Substance and Sexual Activity   Alcohol use: No   Drug use: No   Sexual activity: Yes    Birth control/protection: None    Comment: intercourse age 80, less than 5 sexual partners  Other Topics Concern   Not on file  Social History Narrative   Not on file   Social Determinants of Health   Financial Resource Strain: Not on file  Food Insecurity: No Food Insecurity (10/15/2022)   Hunger Vital Sign    Worried About Running Out of Food in the Last Year: Never true    Ran Out of Food in the Last Year: Never true  Transportation Needs: No  Transportation Needs (10/15/2022)   PRAPARE -  Hydrologist (Medical): No    Lack of Transportation (Non-Medical): No  Physical Activity: Not on file  Stress: Not on file  Social Connections: Not on file  Intimate Partner Violence: Not At Risk (09/18/2022)   Humiliation, Afraid, Rape, and Kick questionnaire    Fear of Current or Ex-Partner: No    Emotionally Abused: No    Physically Abused: No    Sexually Abused: No   Review of Systems:  Review of Systems  Respiratory:  Positive for cough. Negative for shortness of breath.   Cardiovascular:  Negative for chest pain.  Gastrointestinal:  Positive for melena. Negative for abdominal pain, constipation and diarrhea.    OBJECTIVE:   Temp:  [97.5 F (36.4 C)-97.7 F (36.5 C)] 97.5 F (36.4 C) (03/25 1442) Pulse Rate:  [73-97] 73 (03/25 1442) Resp:  [16-18] 18 (03/25 1442) BP: (118-152)/(87-109) 118/97 (03/25 1442) SpO2:  [98 %-99 %] 99 % (03/25 1442) Weight:  [81.6 kg] 81.6 kg (03/25 1027)   Physical Exam Constitutional:      General: She is not in acute distress.    Appearance: She is not ill-appearing, toxic-appearing or diaphoretic.  Cardiovascular:     Rate and Rhythm: Normal rate. Rhythm irregular.  Pulmonary:     Effort: No respiratory distress.     Breath sounds: Normal breath sounds.  Abdominal:     General: Bowel sounds are normal. There is no distension.     Palpations: Abdomen is soft.     Tenderness: There is abdominal tenderness (epigastric). There is no guarding.  Musculoskeletal:     Right lower leg: Edema present.     Left lower leg: Edema present.  Skin:    General: Skin is warm and dry.     Coloration: Skin is not jaundiced.  Neurological:     Mental Status: She is alert.     Labs: Recent Labs    12/27/22 1052  WBC 16.0*  HGB 11.7*  HCT 38.0  PLT 267   BMET Recent Labs    12/27/22 1052  NA 137  K 3.7  CL 108  CO2 23  GLUCOSE 135*  BUN 34*  CREATININE 1.24*  CALCIUM 8.5*   LFT Recent Labs     12/27/22 1052  PROT 6.3*  ALBUMIN 3.5  AST 15  ALT 16  ALKPHOS 62  BILITOT 1.1   PT/INR No results for input(s): "LABPROT", "INR" in the last 72 hours.  Diagnostic imaging: No results found.  IMPRESSION: Melena, resolved Anemia, unspecified Epigastric abdominal discomfort History hiatal hernia with Cameron's erosions on 09/25/22 History provoked pulmonary embolism 2020, on Eliquis prior to admission  -Last dose 12/26/22 Hypertension Hyperlipidemia History sleep apnea  PLAN: -Continue IV PPI therapy Q12Hr -Trend H/H, transfuse for Hgb < 7 -Monitor bowel movements -Check iron studies -Had in depth discussion with patient and her family at bedside regarding proceeding with EGD, discussed benefits of procedure, alternatives and risks including bleeding/infection/perforation/anesthesia risk. She verbalized understanding and wished to proceed with procedure.  -Ok for diet today, NPO at midnight -Plan for EGD 12/28/22 -Further recommendations pending endoscopy   LOS: 0 days   Danton Clap, [facility]astroenter[facility]

## 2022-12-27 NOTE — ED Provider Notes (Signed)
[place] EMERGENCY DEPARTMENT AT [facility] Provider Note   CSN: [id number] Arrival date & time: 12/27/22  [id number]     History {Add pertinent medical, surgical, social history, OB history to HPI:1} Chief Complaint  Patient presents with   Rectal Bleeding    Cheryl Goodman is a 82 y.o. female.  HPI    Pt comes in w Home Medications Prior to Admission medications   Medication Sig Start Date End Date Taking? Authorizing Provider  acetaminophen (TYLENOL) 325 MG tablet Take 325-650 mg by mouth every 6 (six) hours as needed for headache or mild pain.    [provider]  acetaminophen-codeine (TYLENOL #3) 300-30 MG tablet Take 1 tablet by mouth every 4 (four) hours as needed for moderate pain.    [provider]  apixaban (ELIQUIS) 5 MG TABS tablet Take 1 tablet (5 mg total) by mouth 2 (two) times daily. 09/26/22 10/26/22  Amin, Jeanella Flattery, MD  Biotin 1000 MCG tablet Take 1,000 mcg by mouth at bedtime.    [provider]  Cholecalciferol (VITAMIN D3) 125 MCG (5000 UT) TABS Take 5,000 Units by mouth daily.    [provider]  docusate sodium (COLACE) 100 MG capsule Take 1 capsule (100 mg total) by mouth 2 (two) times daily as needed for mild constipation or moderate constipation. 09/26/22   Amin, Jeanella Flattery, MD  NON FORMULARY Take 1 tablet by mouth See admin instructions. D-arginine tablets- Take 1 tablet by mouth at bedtime    [provider]  pantoprazole (PROTONIX) 40 MG tablet Take 1 tablet (40 mg total) by mouth 2 (two) times daily before a meal. 09/26/22 11/25/22  Amin, Jeanella Flattery, MD  polyethylene glycol (MIRALAX / GLYCOLAX) 17 g packet Take 17 g by mouth daily. 08/07/19   Eugenie Filler, MD  simvastatin (ZOCOR) 20 MG tablet Take 20 mg by mouth at bedtime.    [provider]  sucralfate (CARAFATE) 1 g tablet Take 1 g by mouth in the morning.    [provider]  torsemide (DEMADEX) 20 MG tablet Take  20 mg by mouth daily.    [provider]  traMADol (ULTRAM) 50 MG tablet Take 50 mg by mouth in the morning.    [provider]  triamterene-hydrochlorothiazide (DYAZIDE) 37.5-25 MG capsule Take 1 capsule by mouth daily.    [provider]      Allergies    Sulfa antibiotics, Dexlansoprazole, Ace inhibitors, Oxycodone, and Penicillins    Review of Systems   Review of Systems  Physical Exam Updated Vital Signs BP 138/77   Pulse 64   Temp (!) 97.5 F (36.4 C) (Oral)   Resp 18   Ht 4\' 10"  (1.473 m)   Wt 81.6 kg   SpO2 100%   BMI 37.62 kg/m  Physical Exam  ED Results / Procedures / Treatments   Labs (all labs ordered are listed, but only abnormal results are displayed) Labs Reviewed  COMPREHENSIVE METABOLIC PANEL - Abnormal; Notable for the following components:      Result Value   Glucose, Bld 135 (*)    BUN 34 (*)    Creatinine, Ser 1.24 (*)    Calcium 8.5 (*)    Total Protein 6.3 (*)    GFR, Estimated 43 (*)    All other components within normal limits  CBC - Abnormal; Notable for the following components:   WBC 16.0 (*)    Hemoglobin 11.7 (*)    All other  components within normal limits  IRON AND TIBC  FERRITIN  POC OCCULT BLOOD, ED  TYPE AND SCREEN    EKG None  Radiology No results found.  Procedures Procedures  {Document cardiac monitor, telemetry assessment procedure when appropriate:1}  Medications Ordered in ED Medications  pantoprazole (PROTONIX) 80 mg /NS 100 mL IVPB (has no administration in time range)  simvastatin (ZOCOR) tablet 20 mg (has no administration in time range)  torsemide (DEMADEX) tablet 20 mg (has no administration in time range)  triamterene-hydrochlorothiazide (DYAZIDE) 37.5-25 MG per capsule 1 capsule (has no administration in time range)  docusate sodium (COLACE) capsule 100 mg (has no administration in time range)  0.9 %  sodium chloride infusion (has no administration in time range)   acetaminophen (TYLENOL) tablet 650 mg (has no administration in time range)    Or  acetaminophen (TYLENOL) suppository 650 mg (has no administration in time range)  docusate sodium (COLACE) capsule 100 mg (has no administration in time range)  ondansetron (ZOFRAN) tablet 4 mg (has no administration in time range)    Or  ondansetron (ZOFRAN) injection 4 mg (has no administration in time range)  pantoprazole (PROTONIX) injection 40 mg (has no administration in time range)    ED Course/ Medical Decision Making/ A&P   {   Click here for ABCD2, HEART and other calculatorsREFRESH Note before signing :1}                          Medical Decision Making Amount and/or Complexity of Data Reviewed Labs: ordered.  Risk Decision regarding hospitalization.   ***  {Document critical care time when appropriate:1} {Document review of labs and clinical decision tools ie heart score, Chads2Vasc2 etc:1}  {Document your independent review of radiology images, and any outside records:1} {Document your discussion with family members, caretakers, and with consultants:1} {Document social determinants of health affecting pt's care:1} {Document your decision making why or why not admission, treatments were needed:1} Final Clinical Impression(s) / ED Diagnoses Final diagnoses:  Acute GI bleeding  Melena    Rx / DC Orders ED Discharge Orders     None

## 2022-12-27 NOTE — H&P (Signed)
History and Physical    Cheryl Goodman [id number] DOB: [birth year] DOA: 12/27/2022  PCP: [name], MD   Patient coming from: Home  I have personally briefly reviewed patient's old medical records in [facility]  Chief Complaint: Dark colored stools.  HPI: Cheryl Goodman is a 82 y.o. female with PMH significant for GERD, history of hiatal hernia, history of kidney stones, history of PE on Eliquis, hyperlipidemia, hypertension, history of CVA presented in the ED with complaints of dark-colored stools for last few days.  Patient had similar episode twice in the past and was admitted and underwent endoscopy,  found to have hiatal hernia and gastritis.  Patient reports having black-colored stools few days back, denies any dizziness, lightheadedness or palpitations or shortness of breath.  She continues to have ongoing dark-colored stools and getting brownish now.  Patient has contacted her primary care physician who has advised the patient to go to the hospital since she had this similar presentation in the past.  Patient denies any recent travel, sick contacts, nausea, vomiting, diarrhea, constipation, abdominal pain.  ED Course: She was hemodynamically stable except hypertension. HR 97, RR 16, BP 152/87, SpO2 99% on room air, temp 97.7 Labs include sodium 137, potassium 3.7, chloride 108, bicarb 23, glucose 135, BUN 34, creatinine 1.24, calcium 8.5, anion gap 6, alkaline phosphatase 62, albumin 3.5, AST 15, ALT 16, total protein 6.3, total bilirubin 1.1, WBC 16.0, hemoglobin 11.7, hematocrit 38.0, platelet 267,  Review of Systems: Review of Systems  Constitutional: Negative.   HENT: Negative.    Eyes: Negative.   Gastrointestinal:  Positive for blood in stool, heartburn and melena.  Genitourinary: Negative.   Musculoskeletal: Negative.   Skin: Negative.   Neurological: Negative.   Endo/Heme/Allergies: Negative.   Psychiatric/Behavioral: Negative.       Past Medical  History:  Diagnosis Date   Acute pulmonary embolism with acute cor pulmonale ([place]) 08/10/2019   Anemia    Arthritis    Dysphagia    GERD (gastroesophageal reflux disease)    History of hiatal hernia    History of kidney stones    x2 ; passed independently   Hyperlipidemia    Hypertension    Nausea    OA (osteoarthritis) of knee 10/31/2017   Sleep apnea    Stroke [facility])     Past Surgical History:  Procedure Laterality Date   ABDOMINAL HYSTERECTOMY     APPENDECTOMY     BREAST BIOPSY Right    BREAST SURGERY     CARPAL TUNNEL RELEASE     CHOLECYSTECTOMY     COLONOSCOPY WITH PROPOFOL N/A 09/01/2015   Procedure: COLONOSCOPY WITH PROPOFOL;  Surgeon: Manya Silvas, MD;  Location: [place];  Service: Endoscopy;  Laterality: N/A;   COLONOSCOPY WITH PROPOFOL N/A 04/29/2021   Procedure: COLONOSCOPY WITH PROPOFOL;  Surgeon: Robert Bellow, MD;  Location: [facility] ENDOSCOPY;  Service: Gastroenterology;  Laterality: N/A;   ESOPHAGOGASTRODUODENOSCOPY N/A 07/17/2019   Procedure: ESOPHAGOGASTRODUODENOSCOPY (EGD);  Surgeon: Virgel Manifold, MD;  Location: [facility] ENDOSCOPY;  Service: Endoscopy;  Laterality: N/A;   ESOPHAGOGASTRODUODENOSCOPY (EGD) WITH PROPOFOL N/A 04/29/2021   Procedure: ESOPHAGOGASTRODUODENOSCOPY (EGD) WITH PROPOFOL;  Surgeon: Robert Bellow, MD;  Location: [facility] ENDOSCOPY;  Service: Gastroenterology;  Laterality: N/A;   ESOPHAGOGASTRODUODENOSCOPY (EGD) WITH PROPOFOL N/A 09/21/2022   Procedure: ESOPHAGOGASTRODUODENOSCOPY (EGD) WITH PROPOFOL;  Surgeon: Arta Silence, MD;  Location: [facility] ENDOSCOPY;  Service: Gastroenterology;  Laterality: N/A;   IR KYPHO LUMBAR INC FX REDUCE BONE BX UNI/BIL  CANNULATION INC/IMAGING  10/19/2022   IR RADIOLOGIST EVAL & MGMT  11/04/2022   REDUCTION MAMMAPLASTY Bilateral    SAVORY DILATION N/A 09/01/2015   Procedure: SAVORY DILATION;  Surgeon: Manya Silvas, MD;  Location: [facility] ENDOSCOPY;  Service: Endoscopy;  Laterality: N/A;   TOTAL KNEE  ARTHROPLASTY Right 10/31/2017   Procedure: RIGHT TOTAL KNEE ARTHROPLASTY;  Surgeon: Gaynelle Arabian, MD;  Location: [facility] ORS;  Service: Orthopedics;  Laterality: Right;     reports that she has never smoked. She has never used smokeless tobacco. She reports that she does not drink alcohol and does not use drugs.  Allergies  Allergen Reactions   Sulfa Antibiotics Anaphylaxis and Swelling   Dexlansoprazole Other (See Comments)    Syncope    Ace Inhibitors Cough   Oxycodone Nausea Only   Penicillins Rash    Has patient had a PCN reaction causing immediate rash, facial/tongue/throat swelling, SOB or lightheadedness with hypotension: No Has patient had a PCN reaction causing severe rash involving mucus membranes or skin necrosis: No Has patient had a PCN reaction that required hospitalization: No Has patient had a PCN reaction occurring within the last 10 years: No If all of the above answers are "NO", then may proceed with Cephalosporin use.     Family History  Problem Relation Age of Onset   Breast cancer Maternal Grandmother        in 41's   Heart disease Neg Hx    Family history reviewed and not pertinent   Prior to Admission medications   Medication Sig Start Date End Date Taking? Authorizing Provider  acetaminophen (TYLENOL) 325 MG tablet Take 325-650 mg by mouth every 6 (six) hours as needed for headache or mild pain.    [provider]  acetaminophen-codeine (TYLENOL #3) 300-30 MG tablet Take 1 tablet by mouth every 4 (four) hours as needed for moderate pain.    [provider]  apixaban (ELIQUIS) 5 MG TABS tablet Take 1 tablet (5 mg total) by mouth 2 (two) times daily. 09/26/22 10/26/22  Amin, Jeanella Flattery, MD  Biotin 1000 MCG tablet Take 1,000 mcg by mouth at bedtime.    [provider]  Cholecalciferol (VITAMIN D3) 125 MCG (5000 UT) TABS Take 5,000 Units by mouth daily.    [provider]  docusate sodium (COLACE) 100 MG capsule Take 1  capsule (100 mg total) by mouth 2 (two) times daily as needed for mild constipation or moderate constipation. 09/26/22   Amin, Jeanella Flattery, MD  NON FORMULARY Take 1 tablet by mouth See admin instructions. D-arginine tablets- Take 1 tablet by mouth at bedtime    [provider]  pantoprazole (PROTONIX) 40 MG tablet Take 1 tablet (40 mg total) by mouth 2 (two) times daily before a meal. 09/26/22 11/25/22  Amin, Jeanella Flattery, MD  polyethylene glycol (MIRALAX / GLYCOLAX) 17 g packet Take 17 g by mouth daily. 08/07/19   Eugenie Filler, MD  simvastatin (ZOCOR) 20 MG tablet Take 20 mg by mouth at bedtime.    [provider]  sucralfate (CARAFATE) 1 g tablet Take 1 g by mouth in the morning.    [provider]  torsemide (DEMADEX) 20 MG tablet Take 20 mg by mouth daily.    [provider]  traMADol (ULTRAM) 50 MG tablet Take 50 mg by mouth in the morning.    [provider]  triamterene-hydrochlorothiazide (DYAZIDE) 37.5-25 MG capsule Take 1 capsule by mouth daily.    [provider]  Physical Exam: Vitals:   12/27/22 1530 12/27/22 1545 12/27/22 1600 12/27/22 1615  BP: (!) 126/92     Pulse: (!) 54 73 79 72  Resp:      Temp:      TempSrc:      SpO2: 98% 100% 96% 99%  Weight:      Height:        Constitutional: Appears comfortable, not in any acute distress. Vitals:   12/27/22 1530 12/27/22 1545 12/27/22 1600 12/27/22 1615  BP: (!) 126/92     Pulse: (!) 54 73 79 72  Resp:      Temp:      TempSrc:      SpO2: 98% 100% 96% 99%  Weight:      Height:       Eyes: PERRL, lids and conjunctivae normal ENMT: Mucous membranes are moist.  Posterior pharynx without exudate.  Normal dentition.  Neck: normal, supple, no masses, no thyromegaly Respiratory: Clear to auscultation bilaterally, respiratory for normal, no accessory muscle use, RR 15 Cardiovascular: S1-S2 heard, regular rate and rhythm, no murmur. Abdomen: Soft, nontender,  nondistended, BS+ Musculoskeletal: no clubbing / cyanosis. No joint deformity upper and lower extremities. Good ROM, no contractures. Normal muscle tone.  Skin: no rashes, lesions, ulcers. No induration Neurologic: CN 2-12 grossly intact. Sensation intact, DTR normal. Strength 5/5 in all 4.  Psychiatric: Normal judgment and insight. Alert and oriented x 3. Normal mood.    Labs on Admission: I have personally reviewed following labs and imaging studies  CBC: Recent Labs  Lab 12/27/22 1052  WBC 16.0*  HGB 11.7*  HCT 38.0  MCV 88.8  PLT 99991111   Basic Metabolic Panel: Recent Labs  Lab 12/27/22 1052  NA 137  K 3.7  CL 108  CO2 23  GLUCOSE 135*  BUN 34*  CREATININE 1.24*  CALCIUM 8.5*   GFR: Estimated Creatinine Clearance: 31.6 mL/min (A) (by C-G formula based on SCr of 1.24 mg/dL (H)). Liver Function Tests: Recent Labs  Lab 12/27/22 1052  AST 15  ALT 16  ALKPHOS 62  BILITOT 1.1  PROT 6.3*  ALBUMIN 3.5   No results for input(s): "LIPASE", "AMYLASE" in the last 168 hours. No results for input(s): "AMMONIA" in the last 168 hours. Coagulation Profile: No results for input(s): "INR", "PROTIME" in the last 168 hours. Cardiac Enzymes: No results for input(s): "CKTOTAL", "CKMB", "CKMBINDEX", "TROPONINI" in the last 168 hours. BNP (last 3 results) No results for input(s): "PROBNP" in the last 8760 hours. HbA1C: No results for input(s): "HGBA1C" in the last 72 hours. CBG: No results for input(s): "GLUCAP" in the last 168 hours. Lipid Profile: No results for input(s): "CHOL", "HDL", "LDLCALC", "TRIG", "CHOLHDL", "LDLDIRECT" in the last 72 hours. Thyroid Function Tests: No results for input(s): "TSH", "T4TOTAL", "FREET4", "T3FREE", "THYROIDAB" in the last 72 hours. Anemia Panel: No results for input(s): "VITAMINB12", "FOLATE", "FERRITIN", "TIBC", "IRON", "RETICCTPCT" in the last 72 hours. Urine analysis:    Component Value Date/Time   COLORURINE YELLOW 09/18/2022 1632    APPEARANCEUR HAZY (A) 09/18/2022 1632   APPEARANCEUR Clear 02/17/2013 1708   LABSPEC 1.023 09/18/2022 1632   LABSPEC 1.004 02/17/2013 1708   PHURINE 5.0 09/18/2022 1632   GLUCOSEU NEGATIVE 09/18/2022 1632   GLUCOSEU Negative 02/17/2013 1708   HGBUR NEGATIVE 09/18/2022 1632   BILIRUBINUR NEGATIVE 09/18/2022 1632   BILIRUBINUR Negative 02/17/2013 1708   KETONESUR NEGATIVE 09/18/2022 1632   PROTEINUR NEGATIVE 09/18/2022 1632   NITRITE NEGATIVE 09/18/2022 1632  LEUKOCYTESUR MODERATE (A) 09/18/2022 1632   LEUKOCYTESUR 1+ 02/17/2013 1708    Radiological Exams on Admission: No results found.  EKG:  Please review EKG, ordered.  Assessment/Plan Principal Problem:   Acute upper GI bleeding Active Problems:   History of ischemic stroke   Essential hypertension   Mixed hyperlipidemia   Gastroesophageal reflux disease   Acute upper GI bleeding: Patient presented with episode of melena followed by dark-colored stools for last few days. Hemoglobin dropped from 12> 11.7, continues to have dark-colored stools. Patient denies any dizziness, lightheadedness, nausea, vomiting, diarrhea. She is hemodynamically stable. Monitor H&H every 12 hours Continue IV Protonix 40 mg every 12 hours. Continue IV fluid resuscitation. Hold Eliquis in the setting of GI bleed GI is consulted, scheduled for upper GI endoscopy in the morning.  Essential hypertension: Continue triamterene-hydrochlorothiazide  Hyperlipidemia Continue Zocor 20 mg daily  GERD Continue Protonix 40 mg every 12 hours  History of PE: Hold Eliquis in the setting of GI bleed.   DVT prophylaxis: SCDs Code Status: Full code Family Communication: No family at bed side. Disposition Plan:  Status is: Observation The patient remains OBS appropriate and will d/c before 2 midnights.   Admitted for Acute upper GI bleeding.  GI is consulted.   Consults called: [facility] Admission status: observation   Duard Brady  MD [facility]   If 7PM-7AM, please contact night-coverage   12/27/2022, 5:00 PM

## 2022-12-27 NOTE — H&P (View-Only) (Signed)
[facility] Gastroenterology Consult  Referring Provider: No ref. provider found Primary Care Physician:  [name], MD Primary Gastroenterologist: Dr. Paulita Fujita  Reason for Consultation: Melena, anemia, fatigue  SUBJECTIVE:   HPI: Cheryl Goodman is a 82 y.o. female with past medical history of provoked pulmonary embolism in 2020 (currently on Eliquis), gastroesophageal reflux disease, hypertension, hyperlipidemia, sleep apnea. She presented to [facility] ER on 12/27/22 with 4-5 day history of worsening fatigue. She noted having melanic stool few days prior which has since become dark brown in color. She denied abdominal pain, though did endorse pain on palpitation in her epigastric region. Her last dose of Eliquis was 12/27/22. She denied chest pain and shortness of breath, did report having some cough.   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 Patient noted that she received iron infusions during this prior hospital stay, has not been receiving iron infusion in outpatient setting. She is not on iron therapy. She took pantoprazole twice per day after her recent hospital stay, is now taking this once per day. She reported a history of H.Pylori (none that I see in 2020 endoscopy reports). She denied NSAID use.   Labs on presentation showed Hgb 11.7 (was 12.6 on 12/21/22 and 9.1 on 09/26/22), WBC 16, PLT 267, Na 137, K 3.7, BUN/Cr 34/1.24, AST/ALT 15/16, ALP 62, total bilirubin 1.1. No abdominal imaging completed.   Family (patient's spouse and adult daughter) present at bedside, answered questions to best of my ability.  Past Medical History:  Diagnosis Date   Acute pulmonary embolism with acute cor pulmonale ([place]) 08/10/2019   Anemia    Arthritis    Dysphagia    GERD (gastroesophageal reflux disease)    History of hiatal hernia    History of kidney stones     x2 ; passed independently   Hyperlipidemia    Hypertension    Nausea    OA (osteoarthritis) of knee 10/31/2017   Sleep apnea    Stroke [facility])    Past Surgical History:  Procedure Laterality Date   ABDOMINAL HYSTERECTOMY     APPENDECTOMY     BREAST BIOPSY Right    BREAST SURGERY     CARPAL TUNNEL RELEASE     CHOLECYSTECTOMY     COLONOSCOPY WITH PROPOFOL N/A 09/01/2015   Procedure: COLONOSCOPY WITH PROPOFOL;  Surgeon: Manya Silvas, MD;  Location: [place];  Service: Endoscopy;  Laterality: N/A;   COLONOSCOPY WITH PROPOFOL N/A 04/29/2021   Procedure: COLONOSCOPY WITH PROPOFOL;  Surgeon: Robert Bellow, MD;  Location[facility] ENDOSCOPY;  Service: Gastroenterology;  Laterality: N/A;   ESOPHAGOGASTRODUODENOSCOPY N/A 07/17/2019   Procedure: ESOPHAGOGASTRODUODENOSCOPY (EGD);  Surgeon: Virgel Manifold, MD;  Location: [facility] ENDOSCOPY;  Service: Endoscopy;  Laterality: N/A;   ESOPHAGOGASTRODUODENOSCOPY (EGD) WITH PROPOFOL N/A 04/29/2021   Procedure: ESOPHAGOGASTRODUODENOSCOPY (EGD) WITH PROPOFOL;  Surgeon: Robert Bellow, MD;  Location: [facility] ENDOSCOPY;  Service: Gastroenterology;  Laterality: N/A;   ESOPHAGOGASTRODUODENOSCOPY (EGD) WITH PROPOFOL N/A 09/21/2022   Procedure: ESOPHAGOGASTRODUODENOSCOPY (EGD) WITH PROPOFOL;  Surgeon: Arta Silence, MD;  Location: [facility] ENDOSCOPY;  Service: Gastroenterology;  Laterality: N/A;   IR KYPHO LUMBAR INC FX REDUCE BONE BX UNI/BIL CANNULATION INC/IMAGING  10/19/2022   IR RADIOLOGIST EVAL & MGMT  11/04/2022   REDUCTION MAMMAPLASTY Bilateral    SAVORY DILATION N/A 09/01/2015   Procedure: SAVORY DILATION;  Surgeon: Manya Silvas, MD;  Location: [facility]  ENDOSCOPY;  Service: Endoscopy;  Laterality: N/A;   TOTAL KNEE ARTHROPLASTY Right 10/31/2017   Procedure: RIGHT TOTAL KNEE ARTHROPLASTY;  Surgeon: Gaynelle Arabian, MD;  Location: [facility] ORS;  Service: Orthopedics;  Laterality: Right;   Prior to Admission medications   Medication Sig Start Date End  Date Taking? Authorizing Provider  acetaminophen (TYLENOL) 325 MG tablet Take 325-650 mg by mouth every 6 (six) hours as needed for headache or mild pain.    [provider]  acetaminophen-codeine (TYLENOL #3) 300-30 MG tablet Take 1 tablet by mouth every 4 (four) hours as needed for moderate pain.    [provider]  apixaban (ELIQUIS) 5 MG TABS tablet Take 1 tablet (5 mg total) by mouth 2 (two) times daily. 09/26/22 10/26/22  Amin, Jeanella Flattery, MD  Biotin 1000 MCG tablet Take 1,000 mcg by mouth at bedtime.    [provider]  Cholecalciferol (VITAMIN D3) 125 MCG (5000 UT) TABS Take 5,000 Units by mouth daily.    [provider]  docusate sodium (COLACE) 100 MG capsule Take 1 capsule (100 mg total) by mouth 2 (two) times daily as needed for mild constipation or moderate constipation. 09/26/22   Amin, Jeanella Flattery, MD  NON FORMULARY Take 1 tablet by mouth See admin instructions. D-arginine tablets- Take 1 tablet by mouth at bedtime    [provider]  pantoprazole (PROTONIX) 40 MG tablet Take 1 tablet (40 mg total) by mouth 2 (two) times daily before a meal. 09/26/22 11/25/22  Amin, Jeanella Flattery, MD  polyethylene glycol (MIRALAX / GLYCOLAX) 17 g packet Take 17 g by mouth daily. 08/07/19   Eugenie Filler, MD  simvastatin (ZOCOR) 20 MG tablet Take 20 mg by mouth at bedtime.    [provider]  sucralfate (CARAFATE) 1 g tablet Take 1 g by mouth in the morning.    [provider]  torsemide (DEMADEX) 20 MG tablet Take 20 mg by mouth daily.    [provider]  traMADol (ULTRAM) 50 MG tablet Take 50 mg by mouth in the morning.    [provider]  triamterene-hydrochlorothiazide (DYAZIDE) 37.5-25 MG capsule Take 1 capsule by mouth daily.    [provider]   Current Facility-Administered Medications  Medication Dose Route Frequency Provider Last Rate Last Admin   0.9 %  sodium chloride infusion   Intravenous  Continuous Duard Brady, MD       acetaminophen (TYLENOL) tablet 650 mg  650 mg Oral Q6H PRN Duard Brady, MD       Or   acetaminophen (TYLENOL) suppository 650 mg  650 mg Rectal Q6H PRN Duard Brady, MD       docusate sodium (COLACE) capsule 100 mg  100 mg Oral BID PRN Duard Brady, MD       docusate sodium (COLACE) capsule 100 mg  100 mg Oral BID Duard Brady, MD       ondansetron (ZOFRAN) tablet 4 mg  4 mg Oral Q6H PRN Duard Brady, MD       Or   ondansetron (ZOFRAN) injection 4 mg  4 mg Intravenous Q6H PRN Duard Brady, MD       pantoprazole (PROTONIX) 80 mg /NS 100 mL IVPB  80 mg Intravenous Once Nanavati, Ankit, MD       pantoprazole (PROTONIX) injection 40 mg  40 mg Intravenous Q12H Khatri, Pardeep, MD       simvastatin (ZOCOR) tablet 20 mg  20 mg Oral QHS Duard Brady, MD  torsemide (DEMADEX) tablet 20 mg  20 mg Oral Daily Duard Brady, MD       triamterene-hydrochlorothiazide (DYAZIDE) 37.5-25 MG per capsule 1 capsule  1 capsule Oral Daily Duard Brady, MD       Current Outpatient Medications  Medication Sig Dispense Refill   acetaminophen (TYLENOL) 325 MG tablet Take 325-650 mg by mouth every 6 (six) hours as needed for headache or mild pain.     acetaminophen-codeine (TYLENOL #3) 300-30 MG tablet Take 1 tablet by mouth every 4 (four) hours as needed for moderate pain.     apixaban (ELIQUIS) 5 MG TABS tablet Take 1 tablet (5 mg total) by mouth 2 (two) times daily. 60 tablet 0   Biotin 1000 MCG tablet Take 1,000 mcg by mouth at bedtime.     Cholecalciferol (VITAMIN D3) 125 MCG (5000 UT) TABS Take 5,000 Units by mouth daily.     docusate sodium (COLACE) 100 MG capsule Take 1 capsule (100 mg total) by mouth 2 (two) times daily as needed for mild constipation or moderate constipation. 30 capsule 0   NON FORMULARY Take 1 tablet by mouth See admin instructions. D-arginine tablets- Take 1 tablet by mouth at bedtime     pantoprazole (PROTONIX) 40 MG tablet  Take 1 tablet (40 mg total) by mouth 2 (two) times daily before a meal. 60 tablet 1   polyethylene glycol (MIRALAX / GLYCOLAX) 17 g packet Take 17 g by mouth daily. 30 each 0   simvastatin (ZOCOR) 20 MG tablet Take 20 mg by mouth at bedtime.     sucralfate (CARAFATE) 1 g tablet Take 1 g by mouth in the morning.     torsemide (DEMADEX) 20 MG tablet Take 20 mg by mouth daily.     traMADol (ULTRAM) 50 MG tablet Take 50 mg by mouth in the morning.     triamterene-hydrochlorothiazide (DYAZIDE) 37.5-25 MG capsule Take 1 capsule by mouth daily.     Allergies as of 12/27/2022 - Review Complete 12/27/2022  Allergen Reaction Noted   Sulfa antibiotics Anaphylaxis and Swelling 08/14/2015   Dexlansoprazole Other (See Comments) 08/14/2015   Ace inhibitors Cough 11/05/2015   Oxycodone Nausea Only 12/19/2017   Penicillins Rash 08/14/2015   Family History  Problem Relation Age of Onset   Breast cancer Maternal Grandmother        in 48's   Heart disease Neg Hx    Social History   Socioeconomic History   Marital status: Married    Spouse name: Not on file   Number of children: Not on file   Years of education: Not on file   Highest education level: Not on file  Occupational History   Not on file  Tobacco Use   Smoking status: Never   Smokeless tobacco: Never  Vaping Use   Vaping Use: Never used  Substance and Sexual Activity   Alcohol use: No   Drug use: No   Sexual activity: Yes    Birth control/protection: None    Comment: intercourse age 54, less than 5 sexual partners  Other Topics Concern   Not on file  Social History Narrative   Not on file   Social Determinants of Health   Financial Resource Strain: Not on file  Food Insecurity: No Food Insecurity (10/15/2022)   Hunger Vital Sign    Worried About Running Out of Food in the Last Year: Never true    Ran Out of Food in the Last Year: Never true  Transportation Needs: No  Transportation Needs (10/15/2022)   PRAPARE -  Hydrologist (Medical): No    Lack of Transportation (Non-Medical): No  Physical Activity: Not on file  Stress: Not on file  Social Connections: Not on file  Intimate Partner Violence: Not At Risk (09/18/2022)   Humiliation, Afraid, Rape, and Kick questionnaire    Fear of Current or Ex-Partner: No    Emotionally Abused: No    Physically Abused: No    Sexually Abused: No   Review of Systems:  Review of Systems  Respiratory:  Positive for cough. Negative for shortness of breath.   Cardiovascular:  Negative for chest pain.  Gastrointestinal:  Positive for melena. Negative for abdominal pain, constipation and diarrhea.    OBJECTIVE:   Temp:  [97.5 F (36.4 C)-97.7 F (36.5 C)] 97.5 F (36.4 C) (03/25 1442) Pulse Rate:  [73-97] 73 (03/25 1442) Resp:  [16-18] 18 (03/25 1442) BP: (118-152)/(87-109) 118/97 (03/25 1442) SpO2:  [98 %-99 %] 99 % (03/25 1442) Weight:  [81.6 kg] 81.6 kg (03/25 1027)   Physical Exam Constitutional:      General: She is not in acute distress.    Appearance: She is not ill-appearing, toxic-appearing or diaphoretic.  Cardiovascular:     Rate and Rhythm: Normal rate. Rhythm irregular.  Pulmonary:     Effort: No respiratory distress.     Breath sounds: Normal breath sounds.  Abdominal:     General: Bowel sounds are normal. There is no distension.     Palpations: Abdomen is soft.     Tenderness: There is abdominal tenderness (epigastric). There is no guarding.  Musculoskeletal:     Right lower leg: Edema present.     Left lower leg: Edema present.  Skin:    General: Skin is warm and dry.     Coloration: Skin is not jaundiced.  Neurological:     Mental Status: She is alert.     Labs: Recent Labs    12/27/22 1052  WBC 16.0*  HGB 11.7*  HCT 38.0  PLT 267   BMET Recent Labs    12/27/22 1052  NA 137  K 3.7  CL 108  CO2 23  GLUCOSE 135*  BUN 34*  CREATININE 1.24*  CALCIUM 8.5*   LFT Recent Labs     12/27/22 1052  PROT 6.3*  ALBUMIN 3.5  AST 15  ALT 16  ALKPHOS 62  BILITOT 1.1   PT/INR No results for input(s): "LABPROT", "INR" in the last 72 hours.  Diagnostic imaging: No results found.  IMPRESSION: Melena, resolved Anemia, unspecified Epigastric abdominal discomfort History hiatal hernia with Cameron's erosions on 09/25/22 History provoked pulmonary embolism 2020, on Eliquis prior to admission  -Last dose 12/26/22 Hypertension Hyperlipidemia History sleep apnea  PLAN: -Continue IV PPI therapy Q12Hr -Trend H/H, transfuse for Hgb < 7 -Monitor bowel movements -Check iron studies -Had in depth discussion with patient and her family at bedside regarding proceeding with EGD, discussed benefits of procedure, alternatives and risks including bleeding/infection/perforation/anesthesia risk. She verbalized understanding and wished to proceed with procedure.  -Ok for diet today, NPO at midnight -Plan for EGD 12/28/22 -Further recommendations pending endoscopy   LOS: 0 days   Danton Clap, [facility] Gastroenterology

## 2022-12-28 ENCOUNTER — Observation Stay (HOSPITAL_COMMUNITY): Payer: Medicare HMO | Admitting: Certified Registered Nurse Anesthetist

## 2022-12-28 ENCOUNTER — Observation Stay (HOSPITAL_BASED_OUTPATIENT_CLINIC_OR_DEPARTMENT_OTHER): Payer: Medicare HMO | Admitting: Certified Registered Nurse Anesthetist

## 2022-12-28 ENCOUNTER — Encounter (HOSPITAL_COMMUNITY): Payer: Self-pay | Admitting: Family Medicine

## 2022-12-28 ENCOUNTER — Encounter (HOSPITAL_COMMUNITY)
Admission: EM | Disposition: A | Discharge status: Home / Self Care | Payer: Self-pay | Source: Home / Self Care | Attending: Emergency Medicine

## 2022-12-28 DIAGNOSIS — K449 Diaphragmatic hernia without obstruction or gangrene: Secondary | ICD-10-CM

## 2022-12-28 DIAGNOSIS — K649 Unspecified hemorrhoids: Secondary | ICD-10-CM | POA: Diagnosis not present

## 2022-12-28 DIAGNOSIS — G4730 Sleep apnea, unspecified: Secondary | ICD-10-CM

## 2022-12-28 DIAGNOSIS — D500 Iron deficiency anemia secondary to blood loss (chronic): Secondary | ICD-10-CM | POA: Diagnosis not present

## 2022-12-28 DIAGNOSIS — D62 Acute posthemorrhagic anemia: Secondary | ICD-10-CM | POA: Diagnosis not present

## 2022-12-28 DIAGNOSIS — I10 Essential (primary) hypertension: Secondary | ICD-10-CM | POA: Diagnosis not present

## 2022-12-28 DIAGNOSIS — K921 Melena: Secondary | ICD-10-CM | POA: Diagnosis not present

## 2022-12-28 DIAGNOSIS — D649 Anemia, unspecified: Secondary | ICD-10-CM | POA: Diagnosis not present

## 2022-12-28 DIAGNOSIS — K317 Polyp of stomach and duodenum: Secondary | ICD-10-CM

## 2022-12-28 DIAGNOSIS — K3189 Other diseases of stomach and duodenum: Secondary | ICD-10-CM | POA: Diagnosis not present

## 2022-12-28 DIAGNOSIS — K922 Gastrointestinal hemorrhage, unspecified: Secondary | ICD-10-CM | POA: Diagnosis not present

## 2022-12-28 DIAGNOSIS — K2970 Gastritis, unspecified, without bleeding: Secondary | ICD-10-CM | POA: Diagnosis not present

## 2022-12-28 HISTORY — PX: BIOPSY: SHX5522

## 2022-12-28 HISTORY — PX: ESOPHAGOGASTRODUODENOSCOPY: SHX5428

## 2022-12-28 LAB — PHOSPHORUS: Phosphorus: 3.7 mg/dL (ref 2.5–4.6)

## 2022-12-28 LAB — COMPREHENSIVE METABOLIC PANEL
ALT: 14 U/L (ref 0–44)
AST: 14 U/L — ABNORMAL LOW (ref 15–41)
Albumin: 2.8 g/dL — ABNORMAL LOW (ref 3.5–5.0)
Alkaline Phosphatase: 49 U/L (ref 38–126)
Anion gap: 7 (ref 5–15)
BUN: 25 mg/dL — ABNORMAL HIGH (ref 8–23)
CO2: 22 mmol/L (ref 22–32)
Calcium: 8.4 mg/dL — ABNORMAL LOW (ref 8.9–10.3)
Chloride: 110 mmol/L (ref 98–111)
Creatinine, Ser: 1.1 mg/dL — ABNORMAL HIGH (ref 0.44–1.00)
GFR, Estimated: 50 mL/min — ABNORMAL LOW (ref >60)
Glucose, Bld: 106 mg/dL — ABNORMAL HIGH (ref 70–99)
Potassium: 3.8 mmol/L (ref 3.5–5.1)
Sodium: 139 mmol/L (ref 135–145)
Total Bilirubin: 0.9 mg/dL (ref 0.3–1.2)
Total Protein: 5.1 g/dL — ABNORMAL LOW (ref 6.5–8.1)

## 2022-12-28 LAB — CBC
HCT: 32.3 % — ABNORMAL LOW (ref 36.0–46.0)
Hemoglobin: 10 g/dL — ABNORMAL LOW (ref 12.0–15.0)
MCH: 27.7 pg (ref 26.0–34.0)
MCHC: 31 g/dL (ref 30.0–36.0)
MCV: 89.5 fL (ref 80.0–100.0)
Platelets: 215 10*3/uL (ref 150–400)
RBC: 3.61 MIL/uL — ABNORMAL LOW (ref 3.87–5.11)
RDW: 14.6 % (ref 11.5–15.5)
WBC: 11 10*3/uL — ABNORMAL HIGH (ref 4.0–10.5)
nRBC: 0 % (ref 0.0–0.2)

## 2022-12-28 LAB — MAGNESIUM: Magnesium: 2.2 mg/dL (ref 1.7–2.4)

## 2022-12-28 SURGERY — EGD (ESOPHAGOGASTRODUODENOSCOPY)
Anesthesia: Monitor Anesthesia Care

## 2022-12-28 MED ORDER — LACTATED RINGERS IV SOLN: INTRAVENOUS | Status: DC

## 2022-12-28 MED ORDER — PROPOFOL 500 MG/50ML IV EMUL
INTRAVENOUS | Status: AC
  Filled 2022-12-28: qty 50

## 2022-12-28 MED ORDER — TRIAMTERENE-HCTZ 37.5-25 MG PO TABS
1.0000 | ORAL_TABLET | Freq: Every day | ORAL | Status: DC
  Administered 2022-12-28 – 2022-12-29 (×2): 1 via ORAL
  Filled 2022-12-28 (×2): qty 1

## 2022-12-28 MED ORDER — MELATONIN 3 MG PO TABS
3.0000 mg | ORAL_TABLET | Freq: Once | ORAL | Status: AC
  Administered 2022-12-29: 3 mg via ORAL
  Filled 2022-12-28: qty 1

## 2022-12-28 MED ORDER — PROPOFOL 10 MG/ML IV BOLUS
INTRAVENOUS | Status: DC | PRN
  Administered 2022-12-28: 30 mg via INTRAVENOUS
  Administered 2022-12-28: 20 mg via INTRAVENOUS

## 2022-12-28 MED ORDER — PROPOFOL 500 MG/50ML IV EMUL
INTRAVENOUS | Status: DC | PRN
  Administered 2022-12-28: 125 ug/kg/min via INTRAVENOUS

## 2022-12-28 MED ORDER — LIDOCAINE 2% (20 MG/ML) 5 ML SYRINGE
INTRAMUSCULAR | Status: DC | PRN
  Administered 2022-12-28: 60 mg via INTRAVENOUS

## 2022-12-28 NOTE — Transfer of Care (Signed)
Immediate Anesthesia Transfer of Care Note  Patient: Cheryl Goodman  Procedure(s) Performed: ESOPHAGOGASTRODUODENOSCOPY (EGD) BIOPSY  Patient Location: Endoscopy Unit  Anesthesia Type:MAC  Level of Consciousness: awake and patient cooperative  Airway & Oxygen Therapy: Patient Spontanous Breathing and Patient connected to face mask  Post-op Assessment: Report given to RN and Post -op Vital signs reviewed and stable  Post vital signs: Reviewed and stable  Last Vitals:  Vitals Value Taken Time  BP 103/47 12/28/22 1128  Temp    Pulse 70 12/28/22 1129  Resp 22 12/28/22 1129  SpO2 100 % 12/28/22 1129  Vitals shown include unvalidated device data.  Last Pain:  Vitals:   12/28/22 1128  TempSrc:   PainSc: 0-No pain         Complications: No notable events documented.

## 2022-12-28 NOTE — Anesthesia Preprocedure Evaluation (Addendum)
Anesthesia Evaluation  Patient identified by MRN, date of birth, ID band Patient awake    Reviewed: Allergy & Precautions, NPO status , Patient's Chart, lab work & pertinent test results  Airway Mallampati: II  TM Distance: >3 FB Neck ROM: Full    Dental no notable dental hx.    Pulmonary sleep apnea , PE   Pulmonary exam normal        Cardiovascular hypertension,  Rhythm:Regular Rate:Normal     Neuro/Psych CVA  negative psych ROS   GI/Hepatic Neg liver ROS, hiatal hernia,GERD  Medicated,,  Endo/Other  negative endocrine ROS    Renal/GU   negative genitourinary   Musculoskeletal  (+) Arthritis , Osteoarthritis,    Abdominal Normal abdominal exam  (+)   Peds  Hematology  (+) Blood dyscrasia, anemia Lab Results      Component                Value               Date                      WBC                      11.0 (H)            12/28/2022                HGB                      10.0 (L)            12/28/2022                HCT                      32.3 (L)            12/28/2022                MCV                      89.5                12/28/2022                PLT                      215                 12/28/2022              Anesthesia Other Findings   Reproductive/Obstetrics                             Anesthesia Physical Anesthesia Plan  ASA: 3  Anesthesia Plan: MAC   Post-op Pain Management:    Induction: Intravenous  PONV Risk Score and Plan: 2  Airway Management Planned: Simple Face Mask and Nasal Cannula  Additional Equipment: None  Intra-op Plan:   Post-operative Plan: Extubation in OR  Informed Consent: I have reviewed the patients History and Physical, chart, labs and discussed the procedure including the risks, benefits and alternatives for the proposed anesthesia with the patient or authorized representative who has indicated his/her understanding and  acceptance.     Dental advisory given  Plan Discussed with: CRNA  Anesthesia Plan  Comments:        Anesthesia Quick Evaluation

## 2022-12-28 NOTE — Anesthesia Postprocedure Evaluation (Signed)
Anesthesia Post Note  Patient: VALECIA HACKERT  Procedure(s) Performed: ESOPHAGOGASTRODUODENOSCOPY (EGD) BIOPSY     Patient location during evaluation: Endoscopy Anesthesia Type: MAC Level of consciousness: awake and alert Pain management: pain level controlled Vital Signs Assessment: post-procedure vital signs reviewed and stable Respiratory status: spontaneous breathing, nonlabored ventilation, respiratory function stable and patient connected to nasal cannula oxygen Cardiovascular status: stable and blood pressure returned to baseline Postop Assessment: no apparent nausea or vomiting Anesthetic complications: no   No notable events documented.  Last Vitals:  Vitals:   12/28/22 1130 12/28/22 1140  BP: (!) 101/47 128/60  Pulse: 67 65  Resp: (!) 21 (!) 23  Temp:    SpO2: 100% 98%    Last Pain:  Vitals:   12/28/22 1140  TempSrc:   PainSc: 0-No pain                 Belenda Cruise P Aryaan Persichetti

## 2022-12-28 NOTE — Op Note (Signed)
[facility] Patient Name: Cheryl Goodman Procedure Date: 12/28/2022 MRN: [id number] Attending MD: Danton Clap DO, DO, [id number] Date of Birth: 1941-04-01 CSN: [id number] Age: 82 Admit Type: Inpatient Procedure:                Upper GI endoscopy Indications:              Acute post hemorrhagic anemia, Melena Providers:                Danton Clap DO, DO, Adah Perl RN, RN,                            Benetta Spar, Technician Referring MD:              Medicines:                See the Anesthesia note for documentation of the                            administered medications Complications:            No immediate complications. Estimated Blood Loss:     Estimated blood loss was minimal. Procedure:                Pre-Anesthesia Assessment:                           - ASA Grade Assessment: III - A patient with severe                            systemic disease.                           - The risks and benefits of the procedure and the                            sedation options and risks were discussed with the                            patient. All questions were answered and informed                            consent was obtained.                           After obtaining informed consent, the endoscope was                            passed under direct vision. Throughout the                            procedure, the patient's blood pressure, pulse, and                            oxygen saturations were monitored continuously. The                            [id number]  Olympus Endoscope was                            introduced through the mouth, and advanced to the                            third part of duodenum. The upper GI endoscopy was                            accomplished without difficulty. The patient                            tolerated the procedure well. Scope In: Scope Out: Findings:      A 10 cm hiatal hernia was  present. With findings of non-bleeding [name]       erosion x2 at diaphragmatic pinch.      The Z-line was regular and was found 32 cm from the incisors.      Localized mild inflammation characterized by erythema was found in the       gastric antrum. Biopsies were taken with a cold forceps for Helicobacter       pylori testing.      Multiple small sessile polyps with no bleeding and no stigmata of recent       bleeding were found in the gastric body.      The examined duodenum was normal. Impression:               - 10 cm hiatal hernia.                           - Z-line regular, 32 cm from the incisors.                           - Gastritis. Biopsied.                           - Multiple gastric polyps.                           - Normal examined duodenum. Moderate Sedation:      See the other procedure note for documentation of moderate sedation with       intraservice time. Recommendation:           - Return patient to hospital ward for ongoing care.                           - Resume regular diet.                           - Use Protonix (pantoprazole) 40 mg PO BID                            indefinitely.                           - Recommend an iron supplement indefinitely.                           -  Await pathology results.                           - Ok to resume Eliquis today.                           - If melena were to persist, consider small bowel                            enteroscopy. Procedure Code(s):        --- Professional ---                           [phone], Esophagogastroduodenoscopy, flexible,                            transoral; with biopsy, single or multiple Diagnosis Code(s):        --- Professional ---                           K44.9, Diaphragmatic hernia without obstruction or                            gangrene                           K29.70, Gastritis, unspecified, without bleeding                           K31.7, Polyp of stomach and duodenum                            D62, Acute posthemorrhagic anemia                           K92.1, Melena (includes Hematochezia) CPT copyright 2022 American Medical Association. All rights reserved. The codes documented in this report are preliminary and upon coder review may  be revised to meet current compliance requirements. Dr Danton Clap, DO Danton Clap DO, DO 12/28/2022 11:50:21 AM Number of Addenda: 0

## 2022-12-28 NOTE — Brief Op Note (Signed)
12/28/2022  11:25 AM  PATIENT:  Cheryl Goodman  82 y.o. 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  PROCEDURE:  Procedure(s): ESOPHAGOGASTRODUODENOSCOPY (EGD) (N/A) BIOPSY  SURGEON:  Surgeon(s) and Role:    Loney Laurence, DO - Primary  Recommendations:  -Suspect melena and anemia secondary to Cameron's erosions at hiatal hernia diaphragmatic pinch, no active bleeding to treat endoscopically on today's exam -Recommendations to reduce bleeding from Cameron's erosions includes optimizing PPI therapy, would continue pantoprazole 40 mg PO twice per day for now -Additionally, would continue iron supplementation (either per oral or via IV) in the outpatient setting -Last colonoscopy completed 04/29/2021 with findings of diverticulosis in sigmoid and transverse colon -If melena and anemia persists in the inpatient setting, could consider small bowel enteroscopy  -Ok to resume Eliquis today from [place] for diet today from GI perspective -If appropriate for discharge, would have patient complete CBC with PCP later this week to ensure stable Hgb -Would recommend patient follow up with primary gastroenterologist [facility] either Dr. Paulita Fujita or myself) to monitor symptoms and consider video capsule endoscopy if melena were to persist -[facility] will follow while inpatient  Danton Clap, DO [facility]

## 2022-12-28 NOTE — Progress Notes (Signed)
PROGRESS NOTE    Cheryl Goodman  [id number] DOB: [birth year] DOA: 12/27/2022  PCP: [name], MD   Brief Narrative:  This 82 y.o. female with PMH significant for GERD, history of hiatal hernia, history of kidney stones, history of PE on Eliquis, hyperlipidemia, hypertension, history of CVA presented in the ED with complaints of dark-colored stools for last few days.  Patient had similar episode twice in the past and was admitted and underwent endoscopy,  found to have hiatal hernia and gastritis.  Patient reports having black-colored stools few days back, denies any dizziness, lightheadedness or palpitations or shortness of breath.  She continues to have ongoing dark-colored stools and getting brownish now.  Patient has contacted her primary care physician who has advised the patient to go to the hospital since she had this similar presentation in the past.  GI consulted,  Patient is scheduled to have EGD today.  Assessment & Plan:   Principal Problem:   Acute upper GI bleeding Active Problems:   History of ischemic stroke   Essential hypertension   Mixed hyperlipidemia   Gastroesophageal reflux disease   Acute upper GI bleeding: Patient presented with episode of melena followed by dark-colored stools for last few days. Hemoglobin dropped from 12> 11.7>10.2, continues to have dark-colored stools. Patient denies any dizziness, lightheadedness, nausea, vomiting, diarrhea. She is hemodynamically stable. Monitor H&H every 12 hours Continue IV Protonix 40 mg every 12 hours. Continue IV fluid resuscitation. Hold Eliquis in the setting of GI bleed GI is consulted, scheduled to have EGD today.   Essential hypertension: Continue triamterene-hydrochlorothiazide.   Hyperlipidemia Continue Zocor 20 mg daily   GERD Continue Protonix 40 mg every 12 hours   History of PE: Hold Eliquis in the setting of GI bleed.  Acute kidney injury: Likely prerenal in the setting of  dehydration from decreased p.o. intake. Baseline creatinine WNL. Avoid nephrotoxic medications, continue IV fluid resuscitation. Monitor serum creatinine which is improving.   DVT prophylaxis: SCDS Code Status: Full code Family Communication: Family at bed side Disposition Plan:    Status is: Observation The patient remains OBS appropriate and will d/c before 2 midnights.   Consultants:  Gastroenterology  Procedures: EGD  Antimicrobials:  Anti-infectives (From admission, onward)    None       Subjective: Patient was seen and examined at bedside.  Overnight events noted. Patient still reports having dark-colored stools.  She is scheduled to have EGD today.  Objective: Vitals:   12/28/22 1010 12/28/22 1128 12/28/22 1130 12/28/22 1140  BP: (!) 143/66 (!) 103/47 (!) 101/47 128/60  Pulse: 64 69 67 65  Resp: 19 (!) 23 (!) 21 (!) 23  Temp: 98.2 F (36.8 C) (!) 97.3 F (36.3 C)    TempSrc: Temporal Temporal    SpO2: 99% 100% 100% 98%  Weight: 81.6 kg     Height: 4\' 10"  (1.473 m)       Intake/Output Summary (Last 24 hours) at 12/28/2022 1150 Last data filed at 12/28/2022 1120 Gross per 24 hour  Intake 1418.58 ml  Output --  Net 1418.58 ml   Filed Weights   12/27/22 1027 12/28/22 1010  Weight: 81.6 kg 81.6 kg    Examination:  General exam: Appears calm and comfortable, deconditioned. Respiratory system: Clear to auscultation. Respiratory effort normal.  RR 15 Cardiovascular system: S1 & S2 heard, RRR. No JVD, murmurs, rubs, gallops or clicks. No pedal edema. Gastrointestinal system: Abdomen is soft, non tender, non distended, BS+ Central nervous system:  Alert and oriented x 3. No focal neurological deficits. Extremities: No edema, no cyanosis, no clubbing. Skin: No rashes, lesions or ulcers Psychiatry: Judgement and insight appear normal. Mood & affect appropriate.     Data Reviewed: I have personally reviewed following labs and imaging  studies  CBC: Recent Labs  Lab 12/27/22 1052 12/28/22 0510  WBC 16.0* 11.0*  HGB 11.7* 10.0*  HCT 38.0 32.3*  MCV 88.8 89.5  PLT 267 [id number]   Basic Metabolic Panel: Recent Labs  Lab 12/27/22 1052 12/28/22 0510  NA 137 139  K 3.7 3.8  CL 108 110  CO2 23 22  GLUCOSE 135* 106*  BUN 34* 25*  CREATININE 1.24* 1.10*  CALCIUM 8.5* 8.4*  MG  --  2.2  PHOS  --  3.7   GFR: Estimated Creatinine Clearance: 35.6 mL/min (A) (by C-G formula based on SCr of 1.1 mg/dL (H)). Liver Function Tests: Recent Labs  Lab 12/27/22 1052 12/28/22 0510  AST 15 14*  ALT 16 14  ALKPHOS 62 49  BILITOT 1.1 0.9  PROT 6.3* 5.1*  ALBUMIN 3.5 2.8*   No results for input(s): "LIPASE", "AMYLASE" in the last 168 hours. No results for input(s): "AMMONIA" in the last 168 hours. Coagulation Profile: No results for input(s): "INR", "PROTIME" in the last 168 hours. Cardiac Enzymes: No results for input(s): "CKTOTAL", "CKMB", "CKMBINDEX", "TROPONINI" in the last 168 hours. BNP (last 3 results) No results for input(s): "PROBNP" in the last 8760 hours. HbA1C: No results for input(s): "HGBA1C" in the last 72 hours. CBG: No results for input(s): "GLUCAP" in the last 168 hours. Lipid Profile: No results for input(s): "CHOL", "HDL", "LDLCALC", "TRIG", "CHOLHDL", "LDLDIRECT" in the last 72 hours. Thyroid Function Tests: No results for input(s): "TSH", "T4TOTAL", "FREET4", "T3FREE", "THYROIDAB" in the last 72 hours. Anemia Panel: Recent Labs    12/27/22 1703  FERRITIN 14  TIBC 459*  IRON 42   Sepsis Labs: No results for input(s): "PROCALCITON", "LATICACIDVEN" in the last 168 hours.  No results found for this or any previous visit (from the past 240 hour(s)).   Radiology Studies: No results found.  Scheduled Meds:  [MAR Hold] docusate sodium  100 mg Oral BID   [MAR Hold] pantoprazole (PROTONIX) IV  40 mg Intravenous Q12H   [MAR Hold] simvastatin  20 mg Oral QHS   [MAR Hold]  triamterene-hydrochlorothiazide  1 tablet Oral Daily   Continuous Infusions:  sodium chloride 50 mL/hr at 12/27/22 1831   sodium chloride     lactated ringers Stopped (12/28/22 1147)     LOS: 0 days    Time spent: 35 mins    Duard Brady, MD [facility]   If 7PM-7AM, please contact night-coverage

## 2022-12-28 NOTE — Interval H&P Note (Signed)
History and Physical Interval Note:  12/28/2022 11:02 AM  Cheryl Goodman  has presented today for surgery, with the diagnosis of Melena, anemia.  The various methods of treatment have been discussed with the patient and family. After consideration of risks, benefits and other options for treatment, the patient has consented to  Procedure(s): ESOPHAGOGASTRODUODENOSCOPY (EGD) (N/A) as a surgical intervention.  The patient's history has been reviewed, patient examined, no change in status, stable for surgery.  I have reviewed the patient's chart and labs.  Questions were answered to the patient's satisfaction.     Loney Laurence

## 2022-12-29 DIAGNOSIS — D500 Iron deficiency anemia secondary to blood loss (chronic): Secondary | ICD-10-CM | POA: Diagnosis not present

## 2022-12-29 DIAGNOSIS — K921 Melena: Secondary | ICD-10-CM | POA: Diagnosis not present

## 2022-12-29 DIAGNOSIS — K922 Gastrointestinal hemorrhage, unspecified: Secondary | ICD-10-CM | POA: Diagnosis not present

## 2022-12-29 LAB — MAGNESIUM: Magnesium: 2 mg/dL (ref 1.7–2.4)

## 2022-12-29 LAB — CBC
HCT: 32.7 % — ABNORMAL LOW (ref 36.0–46.0)
Hemoglobin: 10 g/dL — ABNORMAL LOW (ref 12.0–15.0)
MCH: 27.5 pg (ref 26.0–34.0)
MCHC: 30.6 g/dL (ref 30.0–36.0)
MCV: 89.8 fL (ref 80.0–100.0)
Platelets: 220 K/uL (ref 150–400)
RBC: 3.64 MIL/uL — ABNORMAL LOW (ref 3.87–5.11)
RDW: 14.8 % (ref 11.5–15.5)
WBC: 10 K/uL (ref 4.0–10.5)
nRBC: 0 % (ref 0.0–0.2)

## 2022-12-29 LAB — HEMOGLOBIN AND HEMATOCRIT, BLOOD
HCT: 35.1 % — ABNORMAL LOW (ref 36.0–46.0)
Hemoglobin: 10.9 g/dL — ABNORMAL LOW (ref 12.0–15.0)

## 2022-12-29 LAB — BASIC METABOLIC PANEL
Anion gap: 7 (ref 5–15)
BUN: 18 mg/dL (ref 8–23)
CO2: 22 mmol/L (ref 22–32)
Calcium: 8.5 mg/dL — ABNORMAL LOW (ref 8.9–10.3)
Chloride: 111 mmol/L (ref 98–111)
Creatinine, Ser: 1.11 mg/dL — ABNORMAL HIGH (ref 0.44–1.00)
GFR, Estimated: 50 mL/min — ABNORMAL LOW (ref >60)
Glucose, Bld: 97 mg/dL (ref 70–99)
Potassium: 3.7 mmol/L (ref 3.5–5.1)
Sodium: 140 mmol/L (ref 135–145)

## 2022-12-29 LAB — SURGICAL PATHOLOGY

## 2022-12-29 LAB — PHOSPHORUS: Phosphorus: 3.7 mg/dL (ref 2.5–4.6)

## 2022-12-29 MED ORDER — PANTOPRAZOLE SODIUM 40 MG PO TBEC: 40.0000 mg | DELAYED_RELEASE_TABLET | Freq: Two times a day (BID) | ORAL | 1 refills | Status: DC

## 2022-12-29 MED ORDER — APIXABAN 5 MG PO TABS
5.0000 mg | ORAL_TABLET | Freq: Two times a day (BID) | ORAL | Status: DC
  Administered 2022-12-29: 5 mg via ORAL
  Filled 2022-12-29: qty 1

## 2022-12-29 NOTE — Plan of Care (Signed)
  Problem: Education: Goal: Knowledge of General Education information will improve Description: Including pain rating scale, medication(s)/side effects and non-pharmacologic comfort measures Outcome: Progressing   Problem: Clinical Measurements: Goal: Ability to maintain clinical measurements within normal limits will improve Outcome: Progressing Goal: Will remain free from infection Outcome: Progressing   

## 2022-12-29 NOTE — Progress Notes (Signed)
[facility] Progress Note  SUBJECTIVE:   Interval history: Cheryl Goodman was seen and evaluated today at bedside.  Resting comfortably in bed with her spouse at bedside.  Noted that she had a melenic appearing stool last evening.  Hemoglobin stable this a.m. at 10.  No significant abdominal pain.  No chest pain.  Shortness of breath and fatigue have improved since her admission.  She tolerated solid food this morning with no issue.  She is very concerned about having further bleeding at home.  Past Medical History:  Diagnosis Date   Acute pulmonary embolism with acute cor pulmonale ([place]) 08/10/2019   Anemia    Arthritis    Dysphagia    GERD (gastroesophageal reflux disease)    History of hiatal hernia    History of kidney stones    x2 ; passed independently   Hyperlipidemia    Hypertension    Nausea    OA (osteoarthritis) of knee 10/31/2017   Sleep apnea    Stroke [name]    Past Surgical History:  Procedure Laterality Date   ABDOMINAL HYSTERECTOMY     APPENDECTOMY     BREAST BIOPSY Right    BREAST SURGERY     CARPAL TUNNEL RELEASE     CHOLECYSTECTOMY     COLONOSCOPY WITH PROPOFOL N/A 09/01/2015   Procedure: COLONOSCOPY WITH PROPOFOL;  Surgeon: Manya Silvas, MD;  Location: [place];  Service: Endoscopy;  Laterality: N/A;   COLONOSCOPY WITH PROPOFOL N/A 04/29/2021   Procedure: COLONOSCOPY WITH PROPOFOL;  Surgeon: Robert Bellow, MD;  Location: [facility] ENDOSCOPY;  Service: Gastroenterology;  Laterality: N/A;   ESOPHAGOGASTRODUODENOSCOPY N/A 07/17/2019   Procedure: ESOPHAGOGASTRODUODENOSCOPY (EGD);  Surgeon: Virgel Manifold, MD;  Location: [facility] ENDOSCOPY;  Service: Endoscopy;  Laterality: N/A;   ESOPHAGOGASTRODUODENOSCOPY (EGD) WITH PROPOFOL N/A 04/29/2021   Procedure: ESOPHAGOGASTRODUODENOSCOPY (EGD) WITH PROPOFOL;  Surgeon: Robert Bellow, MD;  Location: [facility] ENDOSCOPY;  Service: Gastroenterology;  Laterality: N/A;   ESOPHAGOGASTRODUODENOSCOPY  (EGD) WITH PROPOFOL N/A 09/21/2022   Procedure: ESOPHAGOGASTRODUODENOSCOPY (EGD) WITH PROPOFOL;  Surgeon: Arta Silence, MD;  Location: [facility] ENDOSCOPY;  Service: Gastroenterology;  Laterality: N/A;   IR KYPHO LUMBAR INC FX REDUCE BONE BX UNI/BIL CANNULATION INC/IMAGING  10/19/2022   IR RADIOLOGIST EVAL & MGMT  11/04/2022   REDUCTION MAMMAPLASTY Bilateral    SAVORY DILATION N/A 09/01/2015   Procedure: SAVORY DILATION;  Surgeon: Manya Silvas, MD;  Location: [facility] ENDOSCOPY;  Service: Endoscopy;  Laterality: N/A;   TOTAL KNEE ARTHROPLASTY Right 10/31/2017   Procedure: RIGHT TOTAL KNEE ARTHROPLASTY;  Surgeon: Gaynelle Arabian, MD;  Location: [facility] ORS;  Service: Orthopedics;  Laterality: Right;   Current Facility-Administered Medications  Medication Dose Route Frequency Provider Last Rate Last Admin   0.9 %  sodium chloride infusion   Intravenous Continuous Loney Laurence, DO 50 mL/hr at 12/29/22 0200 Infusion Verify at 12/29/22 0200   acetaminophen (TYLENOL) tablet 650 mg  650 mg Oral Q6H PRN Loney Laurence, DO       Or   acetaminophen (TYLENOL) suppository 650 mg  650 mg Rectal Q6H PRN Loney Laurence, DO       docusate sodium (COLACE) capsule 100 mg  100 mg Oral BID Danton Clap H, DO   100 mg at 12/28/22 1301   guaiFENesin-dextromethorphan (ROBITUSSIN DM) 100-10 MG/5ML syrup 5 mL  5 mL Oral Q4H PRN Danton Clap H, DO       ondansetron (ZOFRAN) tablet 4 mg  4 mg Oral Q6H PRN Danton Clap  H, DO       Or   ondansetron (ZOFRAN) injection 4 mg  4 mg Intravenous Q6H PRN Danton Clap H, DO       pantoprazole (PROTONIX) injection 40 mg  40 mg Intravenous Q12H Danton Clap H, DO   40 mg at 12/29/22 0902   simvastatin (ZOCOR) tablet 20 mg  20 mg Oral QHS Danton Clap H, DO   20 mg at 12/28/22 2123   triamterene-hydrochlorothiazide (MAXZIDE-25) 37.5-25 MG per tablet 1 tablet  1 tablet Oral Daily Loney Laurence, DO   1 tablet at 12/29/22 [id number]   Allergies as of 12/27/2022  - Review Complete 12/27/2022  Allergen Reaction Noted   Sulfa antibiotics Anaphylaxis and Swelling 08/14/2015   Dexlansoprazole Other (See Comments) 08/14/2015   Ace inhibitors Cough 11/05/2015   Oxycodone Nausea Only 12/19/2017   Penicillins Rash 08/14/2015   Review of Systems:  Review of Systems  Respiratory:  Negative for shortness of breath.   Cardiovascular:  Negative for chest pain.  Gastrointestinal:  Positive for melena. Negative for abdominal pain, constipation, diarrhea, nausea and vomiting.    OBJECTIVE:   Temp:  [97.3 F (36.3 C)-98 F (36.7 C)] 97.5 F (36.4 C) (03/27 0410) Pulse Rate:  [58-72] 72 (03/27 0410) Resp:  [16-23] 22 (03/27 0410) BP: (101-136)/(47-65) 136/65 (03/27 0410) SpO2:  [96 %-100 %] 100 % (03/27 0410) Last BM Date : 12/28/22 Physical Exam Constitutional:      General: She is not in acute distress.    Appearance: She is not ill-appearing, toxic-appearing or diaphoretic.  Cardiovascular:     Rate and Rhythm: Normal rate. Rhythm irregular.  Pulmonary:     Effort: No respiratory distress.     Breath sounds: Normal breath sounds.     Comments: No supplemental oxygen in place. Abdominal:     General: Bowel sounds are normal. There is no distension.     Palpations: Abdomen is soft.     Tenderness: There is abdominal tenderness (Epigastric). There is no guarding.  Musculoskeletal:        General: Tenderness (Bilateral lower extremity to palpation) present.     Right lower leg: No edema.     Left lower leg: No edema.  Skin:    General: Skin is warm and dry.  Neurological:     Mental Status: She is alert.     Labs: Recent Labs    12/27/22 1052 12/28/22 0510 12/29/22 0531  WBC 16.0* 11.0* 10.0  HGB 11.7* 10.0* 10.0*  HCT 38.0 32.3* 32.7*  PLT 267 215 220   BMET Recent Labs    12/27/22 1052 12/28/22 0510 12/29/22 0531  NA 137 139 140  K 3.7 3.8 3.7  CL 108 110 111  CO2 23 22 22   GLUCOSE 135* 106* 97  BUN 34* 25* 18   CREATININE 1.24* 1.10* 1.11*  CALCIUM 8.5* 8.4* 8.5*   LFT Recent Labs    12/28/22 0510  PROT 5.1*  ALBUMIN 2.8*  AST 14*  ALT 14  ALKPHOS 49  BILITOT 0.9   PT/INR No results for input(s): "LABPROT", "INR" in the last 72 hours. Diagnostic imaging: No results found.  IMPRESSION: Melena Normocytic anemia Large hiatal hernia, Hill grade 4, 10 cm  -Cameron erosions History of provoked pulmonary embolism 2020  -On Eliquis, currently on hold History transverse and sigmoid colon diverticulosis, on colonoscopy 2022 Hypertension Hyperlipidemia  PLAN: -Encouraged that patient hemoglobin is stable today, will recheck at noon today -Etiology of melena suspected related to  Cameron erosions, need to optimize therapy for [facility] erosions including pantoprazole twice per day indefinitely, start iron supplementation (ferritin low normal) -After long discussion with patient and her husband at bedside, will proceed with outpatient video capsule endoscopy to ensure no other etiology of melena, this will be coordinated with my office -Okay to resume Eliquis from GI perspective -Plan to follow-up with patient in office -Discussed with internal medicine   LOS: 0 days   Danton Clap, DO [facility] Gastroenterology

## 2022-12-29 NOTE — Discharge Summary (Signed)
Physician Discharge Summary  Cheryl Goodman [id number] DOB: 02/17/1941 DOA: 12/27/2022  PCP: [name], MD  Admit date: 12/27/2022  Discharge date: 12/29/2022  Admitted From: Home.  Disposition:  Home  Recommendations for Outpatient Follow-up:  Follow up with PCP in 1-2 weeks Please obtain BMP/CBC in one week Advised to follow up GI for capsule endoscopy. Advised to take pantoprazole 40 mg every 12 hours indefinitely.  Home Health:None Equipment/Devices: None  Discharge Condition: Stable CODE STATUS:Full code Diet recommendation: Heart Healthy   Brief [facility] Course: This 81 y.o. female with PMH significant for GERD, history of hiatal hernia, history of kidney stones, history of PE on Eliquis, hyperlipidemia, hypertension, history of CVA presented in the ED with complaints of dark-colored stools for last few days.  Patient had similar episode twice in the past and was admitted and underwent endoscopy,  found to have hiatal hernia and gastritis.  Patient reports having black-colored stools few days back, denies any dizziness, lightheadedness or palpitations or shortness of breath.  She continues to have ongoing dark-colored stools and getting brownish now. Patient has contacted her primary care physician who has advised the patient to go to the hospital since she had this similar presentation in the past. Patient was admitted for further evaluation.GI was consulted, underwent endoscopy found to have Cameron erosions likely reason for melena.  GI recommended pantoprazole twice daily indefinitely, iron supplementation.  Follow-up hemoglobin remained stable.  Patient is being discharged home and patient will follow-up with GI for capsule endoscopy.    Discharge Diagnoses:  Principal Problem:   Acute upper GI bleeding Active Problems:   History of ischemic stroke   Essential hypertension   Mixed hyperlipidemia   Gastroesophageal reflux disease  Acute upper GI  bleeding: Patient presented with episode of melena followed by dark-colored stools for last few days. Hemoglobin dropped from 12> 11.7>10.2, continues to have dark-colored stools. Patient denies any dizziness, lightheadedness, nausea, vomiting, diarrhea. She is hemodynamically stable. Monitor H&H every 12 hours Continue IV Protonix 40 mg every 12 hours. Continue IV fluid resuscitation. Hold Eliquis in the setting of GI bleed GI consulted underwent EGD found to have Cameron erosions which are likely source of melena. H&H remains stable.  Patient denies any further episodes of melena. GI signed off,  recommended patient can be discharged and follow-up outpatient.   Essential hypertension: Continue triamterene-hydrochlorothiazide.   Hyperlipidemia Continue Zocor 20 mg daily   GERD Continue Protonix 40 mg every 12 hours   History of PE: Eliquis resumed.   Acute kidney injury: Improved Likely prerenal in the setting of dehydration from decreased p.o. intake. Baseline creatinine WNL. Avoid nephrotoxic medications, continue IV fluid resuscitation. Monitor serum creatinine which is improving.  Discharge Instructions  Discharge Instructions     Call MD for:  persistant dizziness or light-headedness   Complete by: As directed    Call MD for:  persistant nausea and vomiting   Complete by: As directed    Diet - low sodium heart healthy   Complete by: As directed    Diet Carb Modified   Complete by: As directed    Discharge instructions   Complete by: As directed    Advised to follow up with PCP in one week. Advised to follow up GI for capsule endoscopy. Advised to take pantoprazole 40 mg every 12 hours indefinitely.   Increase activity slowly   Complete by: As directed       Allergies as of 12/29/2022  Reactions   Sulfa Antibiotics Anaphylaxis, Swelling   Dexlansoprazole Other (See Comments)   Syncope   Ace Inhibitors Cough   Oxycodone Nausea Only   Penicillins  Rash   Has patient had a PCN reaction causing immediate rash, facial/tongue/throat swelling, SOB or lightheadedness with hypotension: No Has patient had a PCN reaction causing severe rash involving mucus membranes or skin necrosis: No Has patient had a PCN reaction that required hospitalization: No Has patient had a PCN reaction occurring within the last 10 years: No If all of the above answers are "NO", then may proceed with Cephalosporin use.        Medication List     STOP taking these medications    Biotin 1000 MCG tablet   docusate sodium 100 MG capsule Commonly known as: COLACE       TAKE these medications    acetaminophen 325 MG tablet Commonly known as: TYLENOL Take 325-650 mg by mouth every 6 (six) hours as needed for headache or mild pain.   acetaminophen-codeine 300-30 MG tablet Commonly known as: TYLENOL #3 Take 1 tablet by mouth every 4 (four) hours as needed for moderate pain.   apixaban 5 MG Tabs tablet Commonly known as: ELIQUIS Take 1 tablet (5 mg total) by mouth 2 (two) times daily.   NON FORMULARY Take 1 tablet by mouth See admin instructions. D-arginine tablets- Take 1 tablet by mouth at bedtime   pantoprazole 40 MG tablet Commonly known as: PROTONIX Take 1 tablet (40 mg total) by mouth 2 (two) times daily before a meal.   polyethylene glycol 17 g packet Commonly known as: MIRALAX / GLYCOLAX Take 17 g by mouth daily. What changed:  when to take this reasons to take this   simvastatin 20 MG tablet Commonly known as: ZOCOR Take 20 mg by mouth at bedtime.   sucralfate 1 g tablet Commonly known as: CARAFATE Take 1 g by mouth in the morning and at bedtime.   traMADol 50 MG tablet Commonly known as: ULTRAM Take 50 mg by mouth in the morning.   triamterene-hydrochlorothiazide 37.5-25 MG capsule Commonly known as: DYAZIDE Take 1 capsule by mouth daily.   Vitamin D3 125 MCG (5000 UT) Tabs Generic drug: Cholecalciferol Take 5,000 Units by  mouth daily.        Follow-up Information     [name], MD Follow up in 1 week(s).   Specialty: Internal Medicine Contact information: [place] [address] [place], DO Follow up in 2 week(s).   Specialty: Gastroenterology Contact information: [address] [place] [address] [phone]                Allergies  Allergen Reactions   Sulfa Antibiotics Anaphylaxis and Swelling   Dexlansoprazole Other (See Comments)    Syncope    Ace Inhibitors Cough   Oxycodone Nausea Only   Penicillins Rash    Has patient had a PCN reaction causing immediate rash, facial/tongue/throat swelling, SOB or lightheadedness with hypotension: No Has patient had a PCN reaction causing severe rash involving mucus membranes or skin necrosis: No Has patient had a PCN reaction that required hospitalization: No Has patient had a PCN reaction occurring within the last 10 years: No If all of the above answers are "NO", then may proceed with Cephalosporin use.     Consultations: Gastroenterology   Procedures/Studies: No results found. EGD  Subjective: Patient was seen and examined at bedside.  Overnight events noted.   Patient report doing much better. H/H remains stable.  Patient wants to be discharged.   Patient is being discharged home.  Discharge Exam: Vitals:   12/29/22 0410 12/29/22 1259  BP: 136/65 (!) 125/59  Pulse: 72 86  Resp: (!) 22 18  Temp: (!) 97.5 F (36.4 C) 97.9 F (36.6 C)  SpO2: 100% 98%   Vitals:   12/28/22 1215 12/28/22 2057 12/29/22 0410 12/29/22 1259  BP: (!) 117/55 (!) 127/55 136/65 (!) 125/59  Pulse: (!) 59 62 72 86  Resp: 16 20 (!) 22 18  Temp: 98 F (36.7 C) 97.8 F (36.6 C) (!) 97.5 F (36.4 C) 97.9 F (36.6 C)  TempSrc: Oral Oral Oral Oral  SpO2: 100% 96% 100% 98%  Weight:      Height:        General: Pt is alert, awake, not  in acute distress Cardiovascular: RRR, S1/S2 +, no rubs, no gallops Respiratory: CTA bilaterally, no wheezing, no rhonchi Abdominal: Soft, NT, ND, bowel sounds + Extremities: no edema, no cyanosis    The results of significant diagnostics from this hospitalization (including imaging, microbiology, ancillary and laboratory) are listed below for reference.     Microbiology: No results found for this or any previous visit (from the past 240 hour(s)).   Labs: BNP (last 3 results) No results for input(s): "BNP" in the last 8760 hours. Basic Metabolic Panel: Recent Labs  Lab 12/27/22 1052 12/28/22 0510 12/29/22 0531  NA 137 139 140  K 3.7 3.8 3.7  CL 108 110 111  CO2 23 22 22   GLUCOSE 135* 106* 97  BUN 34* 25* 18  CREATININE 1.24* 1.10* 1.11*  CALCIUM 8.5* 8.4* 8.5*  MG  --  2.2 2.0  PHOS  --  3.7 3.7   Liver Function Tests: Recent Labs  Lab 12/27/22 1052 12/28/22 0510  AST 15 14*  ALT 16 14  ALKPHOS 62 49  BILITOT 1.1 0.9  PROT 6.3* 5.1*  ALBUMIN 3.5 2.8*   No results for input(s): "LIPASE", "AMYLASE" in the last 168 hours. No results for input(s): "AMMONIA" in the last 168 hours. CBC: Recent Labs  Lab 12/27/22 1052 12/28/22 0510 12/29/22 0531 12/29/22 1217  WBC 16.0* 11.0* 10.0  --   HGB 11.7* 10.0* 10.0* 10.9*  HCT 38.0 32.3* 32.7* 35.1*  MCV 88.8 89.5 89.8  --   PLT 267 215 220  --    Cardiac Enzymes: No results for input(s): "CKTOTAL", "CKMB", "CKMBINDEX", "TROPONINI" in the last 168 hours. BNP: Invalid input(s): "POCBNP" CBG: No results for input(s): "GLUCAP" in the last 168 hours. D-Dimer No results for input(s): "DDIMER" in the last 72 hours. Hgb A1c No results for input(s): "HGBA1C" in the last 72 hours. Lipid Profile No results for input(s): "CHOL", "HDL", "LDLCALC", "TRIG", "CHOLHDL", "LDLDIRECT" in the last 72 hours. Thyroid function studies No results for input(s): "TSH", "T4TOTAL", "T3FREE", "THYROIDAB" in the last 72 hours.  Invalid  input(s): "FREET3" Anemia work up Recent Labs    12/27/22 1703  FERRITIN 14  TIBC 459*  IRON 42   Urinalysis    Component Value Date/Time   COLORURINE YELLOW 09/18/2022 1632   APPEARANCEUR HAZY (A) 09/18/2022 1632   APPEARANCEUR Clear 02/17/2013 1708   LABSPEC 1.023 09/18/2022 1632   LABSPEC 1.004 02/17/2013 1708   PHURINE 5.0 09/18/2022 1632   GLUCOSEU NEGATIVE 09/18/2022 1632   GLUCOSEU Negative 02/17/2013 [place]  NEGATIVE 09/18/2022 [place] 09/18/2022 1632   BILIRUBINUR Negative 02/17/2013 1708   KETONESUR NEGATIVE 09/18/2022 1632   PROTEINUR NEGATIVE 09/18/2022 1632   NITRITE NEGATIVE 09/18/2022 1632   LEUKOCYTESUR MODERATE (A) 09/18/2022 1632   LEUKOCYTESUR 1+ 02/17/2013 1708   Sepsis Labs Recent Labs  Lab 12/27/22 1052 12/28/22 0510 12/29/22 0531  WBC 16.0* 11.0* 10.0   Microbiology No results found for this or any previous visit (from the past 240 hour(s)).   Time coordinating discharge: Over 30 minutes  SIGNED:   Duard Brady, MD  [facility] 12/29/2022, 1:37 PM Pager   If 7PM-7AM, please contact night-coverage

## 2022-12-29 NOTE — Plan of Care (Signed)
  Problem: Education: Goal: Knowledge of General Education information will improve Description: Including pain rating scale, medication(s)/side effects and non-pharmacologic comfort measures 12/29/2022 1336 by Lennie Hummer, RN Outcome: Adequate for Discharge 12/29/2022 1212 by Lennie Hummer, RN Outcome: Progressing   Problem: Health Behavior/Discharge Planning: Goal: Ability to manage health-related needs will improve Outcome: Adequate for Discharge   Problem: Clinical Measurements: Goal: Ability to maintain clinical measurements within normal limits will improve 12/29/2022 1336 by Lennie Hummer, RN Outcome: Adequate for Discharge 12/29/2022 1212 by Lennie Hummer, RN Outcome: Progressing Goal: Will remain free from infection 12/29/2022 1336 by Lennie Hummer, RN Outcome: Adequate for Discharge 12/29/2022 1212 by Lennie Hummer, RN Outcome: Progressing Goal: Diagnostic test results will improve Outcome: Adequate for Discharge Goal: Respiratory complications will improve Outcome: Adequate for Discharge Goal: Cardiovascular complication will be avoided Outcome: Adequate for Discharge   Problem: Activity: Goal: Risk for activity intolerance will decrease Outcome: Adequate for Discharge   Problem: Nutrition: Goal: Adequate nutrition will be maintained Outcome: Adequate for Discharge   Problem: Coping: Goal: Level of anxiety will decrease Outcome: Adequate for Discharge   Problem: Elimination: Goal: Will not experience complications related to bowel motility Outcome: Adequate for Discharge Goal: Will not experience complications related to urinary retention Outcome: Adequate for Discharge   Problem: Pain Managment: Goal: General experience of comfort will improve Outcome: Adequate for Discharge   Problem: Safety: Goal: Ability to remain free from injury will improve Outcome: Adequate for Discharge   Problem: Skin Integrity: Goal: Risk for impaired skin  integrity will decrease Outcome: Adequate for Discharge

## 2022-12-29 NOTE — Discharge Instructions (Signed)
Advised to follow up GI for capsule endoscopy. Advised to take pantoprazole 40 mg every 12 hours indefinitely.

## 2022-12-29 NOTE — Plan of Care (Signed)
  Problem: Clinical Measurements: Goal: Ability to maintain clinical measurements within normal limits will improve Outcome: Adequate for Discharge Goal: Diagnostic test results will improve Outcome: Adequate for Discharge Goal: Respiratory complications will improve Outcome: Adequate for Discharge   Problem: Activity: Goal: Risk for activity intolerance will decrease Outcome: Adequate for Discharge   Problem: Pain Managment: Goal: General experience of comfort will improve Outcome: Adequate for Discharge   Problem: Safety: Goal: Ability to remain free from injury will improve Outcome: Adequate for Discharge   Problem: Skin Integrity: Goal: Risk for impaired skin integrity will decrease Outcome: Adequate for Discharge

## 2022-12-30 ENCOUNTER — Encounter (HOSPITAL_COMMUNITY): Payer: Self-pay | Admitting: Internal Medicine

## 2023-01-06 DIAGNOSIS — D509 Iron deficiency anemia, unspecified: Secondary | ICD-10-CM | POA: Diagnosis not present

## 2023-01-06 DIAGNOSIS — J45909 Unspecified asthma, uncomplicated: Secondary | ICD-10-CM | POA: Diagnosis not present

## 2023-01-06 DIAGNOSIS — D500 Iron deficiency anemia secondary to blood loss (chronic): Secondary | ICD-10-CM | POA: Diagnosis not present

## 2023-01-06 DIAGNOSIS — K2960 Other gastritis without bleeding: Secondary | ICD-10-CM | POA: Diagnosis not present

## 2023-01-06 DIAGNOSIS — J45902 Unspecified asthma with status asthmaticus: Secondary | ICD-10-CM | POA: Diagnosis not present

## 2023-01-06 DIAGNOSIS — K279 Peptic ulcer, site unspecified, unspecified as acute or chronic, without hemorrhage or perforation: Secondary | ICD-10-CM | POA: Diagnosis not present

## 2023-01-07 DIAGNOSIS — K2960 Other gastritis without bleeding: Secondary | ICD-10-CM | POA: Diagnosis not present

## 2023-01-07 DIAGNOSIS — J45902 Unspecified asthma with status asthmaticus: Secondary | ICD-10-CM | POA: Diagnosis not present

## 2023-01-07 DIAGNOSIS — D500 Iron deficiency anemia secondary to blood loss (chronic): Secondary | ICD-10-CM | POA: Diagnosis not present

## 2023-01-07 DIAGNOSIS — K279 Peptic ulcer, site unspecified, unspecified as acute or chronic, without hemorrhage or perforation: Secondary | ICD-10-CM | POA: Diagnosis not present

## 2023-01-07 DIAGNOSIS — J45909 Unspecified asthma, uncomplicated: Secondary | ICD-10-CM | POA: Diagnosis not present

## 2023-01-07 DIAGNOSIS — D509 Iron deficiency anemia, unspecified: Secondary | ICD-10-CM | POA: Diagnosis not present

## 2023-01-11 ENCOUNTER — Telehealth: Payer: Self-pay | Admitting: Internal Medicine

## 2023-01-11 ENCOUNTER — Inpatient Hospital Stay: Payer: Medicare HMO

## 2023-01-11 ENCOUNTER — Inpatient Hospital Stay: Payer: Medicare HMO | Attending: Internal Medicine | Admitting: Internal Medicine

## 2023-01-11 ENCOUNTER — Encounter: Payer: Self-pay | Admitting: Internal Medicine

## 2023-01-11 VITALS — BP 112/77 | HR 93 | Temp 97.6°F | Resp 18 | Ht <= 58 in | Wt 179.0 lb

## 2023-01-11 DIAGNOSIS — Z86711 Personal history of pulmonary embolism: Secondary | ICD-10-CM | POA: Diagnosis not present

## 2023-01-11 DIAGNOSIS — Z86718 Personal history of other venous thrombosis and embolism: Secondary | ICD-10-CM | POA: Insufficient documentation

## 2023-01-11 DIAGNOSIS — K449 Diaphragmatic hernia without obstruction or gangrene: Secondary | ICD-10-CM | POA: Insufficient documentation

## 2023-01-11 DIAGNOSIS — Z8673 Personal history of transient ischemic attack (TIA), and cerebral infarction without residual deficits: Secondary | ICD-10-CM | POA: Insufficient documentation

## 2023-01-11 DIAGNOSIS — D500 Iron deficiency anemia secondary to blood loss (chronic): Secondary | ICD-10-CM

## 2023-01-11 DIAGNOSIS — D509 Iron deficiency anemia, unspecified: Secondary | ICD-10-CM | POA: Diagnosis not present

## 2023-01-11 DIAGNOSIS — Z7901 Long term (current) use of anticoagulants: Secondary | ICD-10-CM | POA: Insufficient documentation

## 2023-01-11 NOTE — Progress Notes (Signed)
[facility]  Telephone:(336) [phone] [phone]) [phone]  ID: Cheryl Goodman OB: 01-11-1941  MR#: [id number]  AOZ#:[id number]#:[id number]  Patient Care Team: Danella PentonMiller, Mark F, MD as PCP - General (Internal Medicine) Kemper DurieLane, Monica, RN as [facility] Care Management  REFERRING PROVIDER: Dr. Hyacinth MeekerMiller  REASON FOR REFERRAL: Iron deficiency anemia  HPI: Cheryl Goodman is a 82 y.o. 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  [facility] to 12/29/2022 for melanotic stools.  Endoscopically done by Dr. Lorenso QuarryVreeland showed 10 cm hiatal hernia with nonbleeding Sheria Langameron erosion x 2 at the diaphragmatic pinch.  Mild inflammation in gastric antrum and multiple gastric polyps.  H. pylori testing was negative.  Last colonoscopy was in July 2022 which showed diverticulosis in the sigmoid and transverse colon otherwise normal.  She is planned for capsule endoscopy on March 24 for patient. Labs reviewed.  Iron 42, saturation 9% and ferritin of 14.  Hemoglobin from April for this 10.8.  She has history of iron deficiency anemia.  She has received 1 dose of Feraheme in 2020 and ferrilict in December 2023.  She feels very tired.  Had bronchitis and was started on 10 days of prednisone.  Her breathing has improved since.  Denies any further melanotic stools.  REVIEW OF SYSTEMS:   ROS  As per HPI. Otherwise, a complete review of systems is negative.  PAST MEDICAL HISTORY: Past Medical History:  Diagnosis Date   Acute pulmonary embolism with acute cor pulmonale 08/10/2019   Anemia    Arthritis    Dysphagia    GERD (gastroesophageal reflux disease)    History of hiatal hernia    History of kidney stones    x2 ; passed independently   Hyperlipidemia    Hypertension    Nausea    OA (osteoarthritis) of  knee 10/31/2017   Sleep apnea    Stroke     PAST SURGICAL HISTORY: Past Surgical History:  Procedure Laterality Date   ABDOMINAL HYSTERECTOMY     APPENDECTOMY     BIOPSY  12/28/2022   Procedure: BIOPSY;  Surgeon: Lynann BolognaVreeland, Claire H, DO;  Location: [facility] ENDOSCOPY;  Service: Gastroenterology;;   BREAST BIOPSY Right    BREAST SURGERY     CARPAL TUNNEL RELEASE     CHOLECYSTECTOMY     COLONOSCOPY WITH PROPOFOL N/A 09/01/2015   Procedure: COLONOSCOPY WITH PROPOFOL;  Surgeon: Scot Junobert T Elliott, MD;  [facility];  Service: Endoscopy;  Laterality: N/A;   COLONOSCOPY WITH PROPOFOL N/A 04/29/2021   Procedure: COLONOSCOPY WITH PROPOFOL;  Surgeon: Earline MayotteByrnett, Jeffrey W, MD;  [facility];  Service: Gastroenterology;  Laterality: N/A;   ESOPHAGOGASTRODUODENOSCOPY N/A 07/17/2019   Procedure: ESOPHAGOGASTRODUODENOSCOPY (EGD);  Surgeon: Pasty Spillersahiliani, Varnita B, MD;  [facility];  Service: Endoscopy;  Laterality: N/A;   ESOPHAGOGASTRODUODENOSCOPY N/A 12/28/2022   Procedure: ESOPHAGOGASTRODUODENOSCOPY (EGD);  Surgeon: Lynann BolognaVreeland, Claire H, DO;  [facility];  Service: Gastroenterology;  Laterality: N/A;   ESOPHAGOGASTRODUODENOSCOPY (EGD) WITH PROPOFOL N/A 04/29/2021   Procedure: ESOPHAGOGASTRODUODENOSCOPY (EGD) WITH PROPOFOL;  Surgeon: Earline MayotteByrnett, Jeffrey W, MD;  Location: [facility] ENDOSCOPY;  Service: Gastroenterology;  Laterality: N/A;   ESOPHAGOGASTRODUODENOSCOPY (EGD) WITH PROPOFOL N/A 09/21/2022   Procedure: ESOPHAGOGASTRODUODENOSCOPY (EGD) WITH PROPOFOL;  Surgeon: Willis Modenautlaw, William, MD;  Location: [facility] ENDOSCOPY;  Service: Gastroenterology;  Laterality: N/A;   IR KYPHO LUMBAR INC FX REDUCE BONE BX  UNI/BIL CANNULATION INC/IMAGING  10/19/2022   IR RADIOLOGIST EVAL & MGMT  11/04/2022   REDUCTION MAMMAPLASTY Bilateral    SAVORY DILATION N/A 09/01/2015   Procedure: SAVORY DILATION;  Surgeon: Scot Jun, MD;  Location: [facility] ENDOSCOPY;  Service: Endoscopy;  Laterality: N/A;    TOTAL KNEE ARTHROPLASTY Right 10/31/2017   Procedure: RIGHT TOTAL KNEE ARTHROPLASTY;  Surgeon: Ollen Gross, MD;  Location: [facility] ORS;  Service: Orthopedics;  Laterality: Right;    FAMILY HISTORY: Family History  Problem Relation Age of Onset   Breast cancer Maternal Grandmother        in 42's   Heart disease Neg Hx     HEALTH MAINTENANCE: Social History   Tobacco Use   Smoking status: Never   Smokeless tobacco: Never  Vaping Use   Vaping Use: Never used  Substance Use Topics   Alcohol use: No   Drug use: No     Allergies  Allergen Reactions   Sulfa Antibiotics Anaphylaxis and Swelling   Dexlansoprazole Other (See Comments)    Syncope    Ace Inhibitors Cough   Oxycodone Nausea Only   Penicillins Rash    Has patient had a PCN reaction causing immediate rash, facial/tongue/throat swelling, SOB or lightheadedness with hypotension: No Has patient had a PCN reaction causing severe rash involving mucus membranes or skin necrosis: No Has patient had a PCN reaction that required hospitalization: No Has patient had a PCN reaction occurring within the last 10 years: No If all of the above answers are "NO", then may proceed with Cephalosporin use.     Current Outpatient Medications  Medication Sig Dispense Refill   acetaminophen (TYLENOL) 325 MG tablet Take 325-650 mg by mouth every 6 (six) hours as needed for headache or mild pain.     acetaminophen-codeine (TYLENOL #3) 300-30 MG tablet Take 1 tablet by mouth every 4 (four) hours as needed for moderate pain.     azithromycin (ZITHROMAX) 250 MG tablet Take by mouth.     Cholecalciferol (VITAMIN D3) 125 MCG (5000 UT) TABS Take 5,000 Units by mouth daily.     levofloxacin (LEVAQUIN) 500 MG tablet Take by mouth.     NON FORMULARY Take 1 tablet by mouth See admin instructions. D-arginine tablets- Take 1 tablet by mouth at bedtime     pantoprazole (PROTONIX) 40 MG tablet Take 1 tablet (40 mg total) by mouth 2 (two) times daily before  a meal. 60 tablet 1   predniSONE (DELTASONE) 10 MG tablet Take by mouth.     sucralfate (CARAFATE) 1 g tablet Take 1 g by mouth in the morning and at bedtime.     traMADol (ULTRAM) 50 MG tablet Take 50 mg by mouth in the morning.     triamterene-hydrochlorothiazide (DYAZIDE) 37.5-25 MG capsule Take 1 capsule by mouth daily.     apixaban (ELIQUIS) 5 MG TABS tablet Take 1 tablet (5 mg total) by mouth 2 (two) times daily. 60 tablet 0   clopidogrel (PLAVIX) 75 MG tablet Take 75 mg by mouth daily. (Patient not taking: Reported on 01/11/2023)     DULoxetine (CYMBALTA) 20 MG capsule Take 20 mg by mouth daily. (Patient not taking: Reported on 01/11/2023)     polyethylene glycol (MIRALAX / GLYCOLAX) 17 g packet Take 17 g by mouth daily. (Patient not taking: Reported on 01/11/2023) 30 each 0   simvastatin (ZOCOR) 20 MG tablet Take 20 mg by mouth at bedtime. (Patient not taking: Reported on 01/11/2023)     No  current facility-administered medications for this visit.    OBJECTIVE: Vitals:   01/11/23 1138  BP: 112/77  Pulse: 93  Resp: 18  Temp: 97.6 F (36.4 C)  SpO2: 95%     Body mass index is 37.41 kg/m.      General: Well-developed, well-nourished, no acute distress. Eyes: Pink conjunctiva, anicteric sclera. HEENT: Normocephalic, moist mucous membranes, clear oropharnyx. Lungs: Clear to auscultation bilaterally. Heart: Regular rate and rhythm. No rubs, murmurs, or gallops. Abdomen: Soft, nontender, nondistended. No organomegaly noted, normoactive bowel sounds. Musculoskeletal: No edema, cyanosis, or clubbing. Neuro: Alert, answering all questions appropriately. Cranial nerves grossly intact. Skin: No rashes or petechiae noted. Psych: Normal affect. Lymphatics: No cervical, calvicular, axillary or inguinal LAD.   LAB RESULTS:  Lab Results  Component Value Date   NA 140 12/29/2022   K 3.7 12/29/2022   CL 111 12/29/2022   CO2 22 12/29/2022   GLUCOSE 97 12/29/2022   BUN 18 12/29/2022    CREATININE 1.11 (H) 12/29/2022   CALCIUM 8.5 (L) 12/29/2022   PROT 5.1 (L) 12/28/2022   ALBUMIN 2.8 (L) 12/28/2022   AST 14 (L) 12/28/2022   ALT 14 12/28/2022   ALKPHOS 49 12/28/2022   BILITOT 0.9 12/28/2022   GFRNONAA 50 (L) 12/29/2022   GFRAA >60 08/06/2019    Lab Results  Component Value Date   WBC 10.0 12/29/2022   NEUTROABS 12.1 (H) 09/22/2022   HGB 10.9 (L) 12/29/2022   HCT 35.1 (L) 12/29/2022   MCV 89.8 12/29/2022   PLT 220 12/29/2022    Lab Results  Component Value Date   TIBC 459 (H) 12/27/2022   TIBC 441 09/19/2022   TIBC 415 07/17/2019   FERRITIN 14 12/27/2022   FERRITIN 3 (L) 07/17/2019   IRONPCTSAT 9 (L) 12/27/2022   IRONPCTSAT 6 (L) 09/19/2022   IRONPCTSAT 3 (L) 07/17/2019     STUDIES: No results found.  ASSESSMENT AND PLAN:   POORVI KAPALA is a 82 y.o. female with pmh of DVT/PE in November 2020, GERD, hypertension, hyperlipidemia, stroke in November 2023 was referred to hematology for management of iron deficiency anemia.  # Iron deficiency anemia -Progressive.  Likely secondary to [facility] erosions seen in hiatal hernia.  She has fatigue.   - Admitted at [facility] from 12/27/2022 to 12/29/2022 for melanotic stools.  Endoscopically done by Dr. Lorenso Quarry showed 10 cm hiatal hernia with nonbleeding Sheria Lang erosion x 2 at the diaphragmatic pinch.  Mild inflammation in gastric antrum and multiple gastric polyps.  H. pylori testing was negative.  Last colonoscopy was in July 2022 which showed diverticulosis in the sigmoid and transverse colon otherwise normal.  - She is planned for capsule endoscopy on March 24 for patient. Labs reviewed.  Iron 42, saturation 9% and ferritin of 14.  Hemoglobin from April for this 10.8.  She has history of iron deficiency anemia.  She has received 1 dose of Feraheme in 2020 and ferrilict in December 2023.  I discussed with the patient about IV Feraheme x 2 doses weekly.  I was informed that her insurance covers Venofer so we  will schedule her for IV Venofer weekly x 5.  She has received iron infusions in the past and tolerated well.  # History of unprovoked DVT/PE # History of stroke -On Eliquis.  Was dose reduced to 2.5 mg twice daily by Dr. Hyacinth Meeker after GI bleed.  # GERD -On Protonix twice daily.  # Hypertension-on triamterene HCTZ.  Orders Placed This Encounter  Procedures  CBC with Differential/Platelet   Iron and TIBC   Ferritin   Scheduled for IV Venofer weekly x 5 RTC in 3 months for MD visit, labs, possible Venofer.  Patient expressed understanding and was in agreement with this plan. She also understands that She can call clinic at any time with any questions, concerns, or complaints.   I spent a total of 45 minutes reviewing chart data, face-to-face evaluation with the patient, counseling and coordination of care as detailed above.  Michaelyn Barter, MD   01/11/2023 1:49 PM

## 2023-01-11 NOTE — Telephone Encounter (Signed)
Per chat:  Appointments scheduled  Hi, her insurance does not cover ferraheme. Please schedule for IV venofer weekly x 5. thanks.

## 2023-01-13 ENCOUNTER — Inpatient Hospital Stay: Payer: Medicare HMO

## 2023-01-13 VITALS — BP 112/68 | HR 88 | Temp 99.0°F | Resp 18

## 2023-01-13 DIAGNOSIS — Z7901 Long term (current) use of anticoagulants: Secondary | ICD-10-CM | POA: Diagnosis not present

## 2023-01-13 DIAGNOSIS — D509 Iron deficiency anemia, unspecified: Secondary | ICD-10-CM | POA: Diagnosis not present

## 2023-01-13 DIAGNOSIS — Z86711 Personal history of pulmonary embolism: Secondary | ICD-10-CM | POA: Diagnosis not present

## 2023-01-13 DIAGNOSIS — K449 Diaphragmatic hernia without obstruction or gangrene: Secondary | ICD-10-CM | POA: Diagnosis not present

## 2023-01-13 DIAGNOSIS — Z86718 Personal history of other venous thrombosis and embolism: Secondary | ICD-10-CM | POA: Diagnosis not present

## 2023-01-13 DIAGNOSIS — Z8673 Personal history of transient ischemic attack (TIA), and cerebral infarction without residual deficits: Secondary | ICD-10-CM | POA: Diagnosis not present

## 2023-01-13 DIAGNOSIS — D500 Iron deficiency anemia secondary to blood loss (chronic): Secondary | ICD-10-CM

## 2023-01-13 MED ORDER — SODIUM CHLORIDE 0.9 % IV SOLN
Freq: Once | INTRAVENOUS | Status: AC
  Filled 2023-01-13: qty 250

## 2023-01-13 MED ORDER — SODIUM CHLORIDE 0.9 % IV SOLN
200.0000 mg | Freq: Once | INTRAVENOUS | Status: AC
  Administered 2023-01-13: 200 mg via INTRAVENOUS
  Filled 2023-01-13: qty 200

## 2023-01-20 ENCOUNTER — Inpatient Hospital Stay: Payer: Medicare HMO

## 2023-01-20 VITALS — BP 148/70 | HR 81 | Temp 98.9°F | Resp 18

## 2023-01-20 DIAGNOSIS — K449 Diaphragmatic hernia without obstruction or gangrene: Secondary | ICD-10-CM | POA: Diagnosis not present

## 2023-01-20 DIAGNOSIS — Z8673 Personal history of transient ischemic attack (TIA), and cerebral infarction without residual deficits: Secondary | ICD-10-CM | POA: Diagnosis not present

## 2023-01-20 DIAGNOSIS — Z86711 Personal history of pulmonary embolism: Secondary | ICD-10-CM | POA: Diagnosis not present

## 2023-01-20 DIAGNOSIS — Z7901 Long term (current) use of anticoagulants: Secondary | ICD-10-CM | POA: Diagnosis not present

## 2023-01-20 DIAGNOSIS — D500 Iron deficiency anemia secondary to blood loss (chronic): Secondary | ICD-10-CM

## 2023-01-20 DIAGNOSIS — D509 Iron deficiency anemia, unspecified: Secondary | ICD-10-CM | POA: Diagnosis not present

## 2023-01-20 DIAGNOSIS — Z86718 Personal history of other venous thrombosis and embolism: Secondary | ICD-10-CM | POA: Diagnosis not present

## 2023-01-20 MED ORDER — SODIUM CHLORIDE 0.9 % IV SOLN
Freq: Once | INTRAVENOUS | Status: AC
  Filled 2023-01-20: qty 250

## 2023-01-20 MED ORDER — SODIUM CHLORIDE 0.9 % IV SOLN
200.0000 mg | Freq: Once | INTRAVENOUS | Status: AC
  Administered 2023-01-20: 200 mg via INTRAVENOUS
  Filled 2023-01-20: qty 200

## 2023-01-20 NOTE — Patient Instructions (Signed)

## 2023-01-27 ENCOUNTER — Inpatient Hospital Stay: Payer: Medicare HMO

## 2023-01-27 VITALS — BP 124/69 | HR 72 | Temp 99.6°F | Resp 16

## 2023-01-27 DIAGNOSIS — Z86718 Personal history of other venous thrombosis and embolism: Secondary | ICD-10-CM | POA: Diagnosis not present

## 2023-01-27 DIAGNOSIS — D500 Iron deficiency anemia secondary to blood loss (chronic): Secondary | ICD-10-CM | POA: Diagnosis not present

## 2023-01-27 DIAGNOSIS — Z8673 Personal history of transient ischemic attack (TIA), and cerebral infarction without residual deficits: Secondary | ICD-10-CM | POA: Diagnosis not present

## 2023-01-27 DIAGNOSIS — Z8709 Personal history of other diseases of the respiratory system: Secondary | ICD-10-CM | POA: Diagnosis not present

## 2023-01-27 DIAGNOSIS — D509 Iron deficiency anemia, unspecified: Secondary | ICD-10-CM | POA: Diagnosis not present

## 2023-01-27 DIAGNOSIS — Z0000 Encounter for general adult medical examination without abnormal findings: Secondary | ICD-10-CM | POA: Diagnosis not present

## 2023-01-27 DIAGNOSIS — Z86711 Personal history of pulmonary embolism: Secondary | ICD-10-CM | POA: Diagnosis not present

## 2023-01-27 DIAGNOSIS — Z7901 Long term (current) use of anticoagulants: Secondary | ICD-10-CM | POA: Diagnosis not present

## 2023-01-27 DIAGNOSIS — D6859 Other primary thrombophilia: Secondary | ICD-10-CM | POA: Diagnosis not present

## 2023-01-27 DIAGNOSIS — K449 Diaphragmatic hernia without obstruction or gangrene: Secondary | ICD-10-CM | POA: Diagnosis not present

## 2023-01-27 MED ORDER — SODIUM CHLORIDE 0.9 % IV SOLN
Freq: Once | INTRAVENOUS | Status: AC
  Filled 2023-01-27: qty 250

## 2023-01-27 MED ORDER — SODIUM CHLORIDE 0.9 % IV SOLN
200.0000 mg | Freq: Once | INTRAVENOUS | Status: AC
  Administered 2023-01-27: 200 mg via INTRAVENOUS
  Filled 2023-01-27: qty 200

## 2023-01-27 NOTE — Patient Instructions (Signed)

## 2023-02-02 DIAGNOSIS — K921 Melena: Secondary | ICD-10-CM | POA: Diagnosis not present

## 2023-02-03 ENCOUNTER — Inpatient Hospital Stay: Payer: Medicare HMO | Attending: Internal Medicine

## 2023-02-03 VITALS — BP 139/79 | HR 76 | Temp 99.7°F | Resp 20

## 2023-02-03 DIAGNOSIS — D509 Iron deficiency anemia, unspecified: Secondary | ICD-10-CM | POA: Diagnosis not present

## 2023-02-03 DIAGNOSIS — D500 Iron deficiency anemia secondary to blood loss (chronic): Secondary | ICD-10-CM

## 2023-02-03 MED ORDER — SODIUM CHLORIDE 0.9 % IV SOLN
Freq: Once | INTRAVENOUS | Status: AC
  Filled 2023-02-03: qty 250

## 2023-02-03 MED ORDER — SODIUM CHLORIDE 0.9 % IV SOLN
200.0000 mg | Freq: Once | INTRAVENOUS | Status: AC
  Administered 2023-02-03: 200 mg via INTRAVENOUS
  Filled 2023-02-03: qty 200

## 2023-02-03 NOTE — Patient Instructions (Signed)

## 2023-02-10 ENCOUNTER — Inpatient Hospital Stay: Payer: Medicare HMO

## 2023-02-10 VITALS — BP 114/82 | HR 81 | Temp 98.8°F | Resp 18

## 2023-02-10 DIAGNOSIS — D500 Iron deficiency anemia secondary to blood loss (chronic): Secondary | ICD-10-CM

## 2023-02-10 DIAGNOSIS — D509 Iron deficiency anemia, unspecified: Secondary | ICD-10-CM | POA: Diagnosis not present

## 2023-02-10 MED ORDER — SODIUM CHLORIDE 0.9 % IV SOLN
200.0000 mg | Freq: Once | INTRAVENOUS | Status: AC
  Administered 2023-02-10: 200 mg via INTRAVENOUS
  Filled 2023-02-10: qty 10

## 2023-02-10 MED ORDER — SODIUM CHLORIDE 0.9 % IV SOLN
Freq: Once | INTRAVENOUS | Status: AC
  Filled 2023-02-10: qty 250

## 2023-02-18 ENCOUNTER — Other Ambulatory Visit: Payer: Self-pay | Admitting: Internal Medicine

## 2023-02-18 ENCOUNTER — Ambulatory Visit
Admission: RE | Admit: 2023-02-18 | Discharge: 2023-02-18 | Disposition: A | Discharge status: Home / Self Care | Payer: Medicare HMO | Source: Ambulatory Visit | Attending: Internal Medicine | Admitting: Internal Medicine

## 2023-02-18 DIAGNOSIS — R110 Nausea: Secondary | ICD-10-CM | POA: Diagnosis not present

## 2023-02-18 DIAGNOSIS — D649 Anemia, unspecified: Secondary | ICD-10-CM

## 2023-02-18 DIAGNOSIS — K449 Diaphragmatic hernia without obstruction or gangrene: Secondary | ICD-10-CM | POA: Diagnosis not present

## 2023-03-08 DIAGNOSIS — M810 Age-related osteoporosis without current pathological fracture: Secondary | ICD-10-CM | POA: Diagnosis not present

## 2023-03-16 DIAGNOSIS — I10 Essential (primary) hypertension: Secondary | ICD-10-CM | POA: Diagnosis not present

## 2023-03-16 DIAGNOSIS — E538 Deficiency of other specified B group vitamins: Secondary | ICD-10-CM | POA: Diagnosis not present

## 2023-03-16 DIAGNOSIS — D6859 Other primary thrombophilia: Secondary | ICD-10-CM | POA: Diagnosis not present

## 2023-03-17 ENCOUNTER — Emergency Department (HOSPITAL_COMMUNITY)
Admission: EM | Admit: 2023-03-17 | Discharge: 2023-03-18 | Disposition: A | Discharge status: Home / Self Care | Payer: Medicare HMO | Attending: Emergency Medicine | Admitting: Emergency Medicine

## 2023-03-17 ENCOUNTER — Encounter (HOSPITAL_COMMUNITY): Payer: Self-pay

## 2023-03-17 ENCOUNTER — Emergency Department (HOSPITAL_COMMUNITY): Payer: Medicare HMO

## 2023-03-17 ENCOUNTER — Other Ambulatory Visit: Payer: Self-pay

## 2023-03-17 DIAGNOSIS — W19XXXA Unspecified fall, initial encounter: Secondary | ICD-10-CM

## 2023-03-17 DIAGNOSIS — I10 Essential (primary) hypertension: Secondary | ICD-10-CM | POA: Insufficient documentation

## 2023-03-17 DIAGNOSIS — D72829 Elevated white blood cell count, unspecified: Secondary | ICD-10-CM | POA: Diagnosis not present

## 2023-03-17 DIAGNOSIS — M25511 Pain in right shoulder: Secondary | ICD-10-CM | POA: Diagnosis not present

## 2023-03-17 DIAGNOSIS — W1830XA Fall on same level, unspecified, initial encounter: Secondary | ICD-10-CM | POA: Diagnosis not present

## 2023-03-17 DIAGNOSIS — S4981XA Other specified injuries of right shoulder and upper arm, initial encounter: Secondary | ICD-10-CM | POA: Diagnosis not present

## 2023-03-17 DIAGNOSIS — Z7901 Long term (current) use of anticoagulants: Secondary | ICD-10-CM | POA: Diagnosis not present

## 2023-03-17 DIAGNOSIS — R739 Hyperglycemia, unspecified: Secondary | ICD-10-CM | POA: Insufficient documentation

## 2023-03-17 DIAGNOSIS — Z79899 Other long term (current) drug therapy: Secondary | ICD-10-CM | POA: Insufficient documentation

## 2023-03-17 DIAGNOSIS — Z8673 Personal history of transient ischemic attack (TIA), and cerebral infarction without residual deficits: Secondary | ICD-10-CM | POA: Insufficient documentation

## 2023-03-17 DIAGNOSIS — S0990XA Unspecified injury of head, initial encounter: Secondary | ICD-10-CM

## 2023-03-17 DIAGNOSIS — S42211A Unspecified displaced fracture of surgical neck of right humerus, initial encounter for closed fracture: Secondary | ICD-10-CM

## 2023-03-17 DIAGNOSIS — E876 Hypokalemia: Secondary | ICD-10-CM | POA: Diagnosis not present

## 2023-03-17 DIAGNOSIS — S199XXA Unspecified injury of neck, initial encounter: Secondary | ICD-10-CM | POA: Diagnosis not present

## 2023-03-17 DIAGNOSIS — S0993XA Unspecified injury of face, initial encounter: Secondary | ICD-10-CM | POA: Diagnosis not present

## 2023-03-17 DIAGNOSIS — R001 Bradycardia, unspecified: Secondary | ICD-10-CM | POA: Diagnosis not present

## 2023-03-17 DIAGNOSIS — Z7902 Long term (current) use of antithrombotics/antiplatelets: Secondary | ICD-10-CM | POA: Diagnosis not present

## 2023-03-17 LAB — CBC WITH DIFFERENTIAL/PLATELET
Abs Immature Granulocytes: 0.12 10*3/uL — ABNORMAL HIGH (ref 0.00–0.07)
Basophils Absolute: 0.1 10*3/uL (ref 0.0–0.1)
Basophils Relative: 0 %
Eosinophils Absolute: 0 10*3/uL (ref 0.0–0.5)
Eosinophils Relative: 0 %
HCT: 41.9 % (ref 36.0–46.0)
Hemoglobin: 13.3 g/dL (ref 12.0–15.0)
Immature Granulocytes: 1 %
Lymphocytes Relative: 7 %
Lymphs Abs: 1.1 10*3/uL (ref 0.7–4.0)
MCH: 29.2 pg (ref 26.0–34.0)
MCHC: 31.7 g/dL (ref 30.0–36.0)
MCV: 91.9 fL (ref 80.0–100.0)
Monocytes Absolute: 1 10*3/uL (ref 0.1–1.0)
Monocytes Relative: 6 %
Neutro Abs: 14.1 10*3/uL — ABNORMAL HIGH (ref 1.7–7.7)
Neutrophils Relative %: 86 %
Platelets: 275 10*3/uL (ref 150–400)
RBC: 4.56 MIL/uL (ref 3.87–5.11)
RDW: 16.3 % — ABNORMAL HIGH (ref 11.5–15.5)
WBC: 16.5 10*3/uL — ABNORMAL HIGH (ref 4.0–10.5)
nRBC: 0 % (ref 0.0–0.2)

## 2023-03-17 LAB — BASIC METABOLIC PANEL
Anion gap: 14 (ref 5–15)
BUN: 15 mg/dL (ref 8–23)
CO2: 22 mmol/L (ref 22–32)
Calcium: 8.2 mg/dL — ABNORMAL LOW (ref 8.9–10.3)
Chloride: 104 mmol/L (ref 98–111)
Creatinine, Ser: 1.35 mg/dL — ABNORMAL HIGH (ref 0.44–1.00)
GFR, Estimated: 39 mL/min — ABNORMAL LOW (ref >60)
Glucose, Bld: 139 mg/dL — ABNORMAL HIGH (ref 70–99)
Potassium: 3.2 mmol/L — ABNORMAL LOW (ref 3.5–5.1)
Sodium: 140 mmol/L (ref 135–145)

## 2023-03-17 MED ORDER — MORPHINE SULFATE (PF) 4 MG/ML IV SOLN
4.0000 mg | Freq: Once | INTRAVENOUS | Status: AC
  Administered 2023-03-17: 4 mg via INTRAVENOUS
  Filled 2023-03-17: qty 1

## 2023-03-17 MED ORDER — HYDROCODONE-ACETAMINOPHEN 5-325 MG PO TABS: 1.0000 | ORAL_TABLET | Freq: Four times a day (QID) | ORAL | 0 refills | Status: DC | PRN

## 2023-03-17 MED ORDER — POTASSIUM CHLORIDE CRYS ER 20 MEQ PO TBCR
20.0000 meq | EXTENDED_RELEASE_TABLET | Freq: Once | ORAL | Status: AC
  Administered 2023-03-17: 20 meq via ORAL
  Filled 2023-03-17: qty 1

## 2023-03-17 MED ORDER — HYDROMORPHONE HCL 1 MG/ML IJ SOLN
1.0000 mg | Freq: Once | INTRAMUSCULAR | Status: AC
  Administered 2023-03-17: 1 mg via INTRAVENOUS
  Filled 2023-03-17: qty 1

## 2023-03-17 NOTE — Progress Notes (Signed)
Orthopedic Tech Progress Note Patient Details:  Cheryl Goodman 12/20/1940 [id number]   Ortho Devices Type of Ortho Device: Sling immobilizer Ortho Device/Splint Location: RUE Ortho Device/Splint Interventions: Ordered, Application, Adjustment   Post Interventions Patient Tolerated: Well Instructions Provided: Care of device, Adjustment of device  [name] [name] 03/17/2023, 5:20 PM

## 2023-03-17 NOTE — ED Provider Notes (Signed)
[place] EMERGENCY DEPARTMENT AT [facility] Provider Note   CSN: [id number] Arrival date & time: 03/17/23  1118     History  Chief Complaint  Patient presents with   Fall    On eliquis     Cheryl Goodman is a 82 y.o. female with a past medical history significant for history of upper GI bleed, history of CVA, hypertension, hyperlipidemia, iron deficiency anemia who presents to the ED after a mechanical fall.  Patient activated as a level 2 trauma prior to arrival. Patient is on Eliquis and admits to hitting her head.  No LOC.  Denies headache.  No visual changes or speech changes.  Denies unilateral weakness.  Denies nausea and vomiting.  Patient admits to significant right shoulder pain.  No numbness/tingling.  No other injuries. Denies hip and back pain.   History obtained from patient and past medical records. No interpreter used during encounter.       Home Medications Prior to Admission medications   Medication Sig Start Date End Date Taking? Authorizing Provider  acetaminophen (TYLENOL) 325 MG tablet Take 325-650 mg by mouth every 6 (six) hours as needed for headache or mild pain.   Yes [provider]  acetaminophen-codeine (TYLENOL #3) 300-30 MG tablet Take 1 tablet by mouth every 4 (four) hours as needed for moderate pain.   Yes [provider]  apixaban (ELIQUIS) 2.5 MG TABS tablet Take 2.5 mg by mouth 2 (two) times daily.   Yes [provider]  Cholecalciferol (VITAMIN D3) 125 MCG (5000 UT) TABS Take 5,000 Units by mouth daily.   Yes [provider]  HYDROcodone-acetaminophen (NORCO/VICODIN) 5-325 MG tablet Take 1 tablet by mouth every 6 (six) hours as needed. 03/17/23  Yes Aide Wojnar C, PA-C  PROLIA 60 MG/ML SOSY injection Inject 60 mg into the skin every 6 (six) months.   Yes [provider]  simvastatin (ZOCOR) 20 MG tablet Take 20 mg by mouth at bedtime.   Yes [provider]  sucralfate  (CARAFATE) 1 g tablet Take 1 g by mouth in the morning and at bedtime.   Yes [provider]  traMADol (ULTRAM) 50 MG tablet Take 50 mg by mouth in the morning.   Yes [provider]  triamterene-hydrochlorothiazide (DYAZIDE) 37.5-25 MG capsule Take 1 capsule by mouth daily.   Yes [provider]  apixaban (ELIQUIS) 5 MG TABS tablet Take 1 tablet (5 mg total) by mouth 2 (two) times daily. Patient not taking: Reported on 03/17/2023 09/26/22 12/27/22  Dimple Nanas, MD  azithromycin (ZITHROMAX) 250 MG tablet Take by mouth. Patient not taking: Reported on 03/17/2023 01/04/23   [provider]  clopidogrel (PLAVIX) 75 MG tablet Take 75 mg by mouth daily. Patient not taking: Reported on 01/11/2023 12/24/22   [provider]  DULoxetine (CYMBALTA) 20 MG capsule Take 20 mg by mouth daily. Patient not taking: Reported on 01/11/2023 12/24/22   [provider]  pantoprazole (PROTONIX) 40 MG tablet Take 1 tablet (40 mg total) by mouth 2 (two) times daily before a meal. Patient not taking: Reported on 03/17/2023 12/29/22 03/17/23  Willeen Niece, MD  polyethylene glycol (MIRALAX / GLYCOLAX) 17 g packet Take 17 g by mouth daily. Patient not taking: Reported on 01/11/2023 08/07/19   Rodolph Bong, MD      Allergies    Sulfa antibiotics, Dexlansoprazole, Ace inhibitors, Oxycodone, and Penicillins    Review of Systems   Review of Systems  Eyes:  Negative for visual disturbance.  Musculoskeletal:  Positive for arthralgias. Negative for back pain.  Neurological:  Negative for weakness, numbness and headaches.    Physical Exam Updated Vital Signs BP 123/68   Pulse 79   Temp 97.6 F (36.4 C) (Oral)   Resp 19   Ht 4\' 10"  (1.473 m)   Wt 79.4 kg   SpO2 97%   BMI 36.58 kg/m  Physical Exam Vitals and nursing note reviewed.  Constitutional:      General: She is not in acute distress.    Appearance: She is not ill-appearing.  HENT:     Head:  Normocephalic.  Eyes:     Pupils: Pupils are equal, round, and reactive to light.  Neck:     Comments: No cervical midline tenderness Cardiovascular:     Rate and Rhythm: Normal rate and regular rhythm.     Pulses: Normal pulses.     Heart sounds: Normal heart sounds. No murmur heard.    No friction rub. No gallop.  Pulmonary:     Effort: Pulmonary effort is normal.     Breath sounds: Normal breath sounds.  Abdominal:     General: Abdomen is flat. There is no distension.     Palpations: Abdomen is soft.     Tenderness: There is no abdominal tenderness. There is no guarding or rebound.  Musculoskeletal:        General: Normal range of motion.     Cervical back: Neck supple.     Comments: TTP to right shoulder. Radial pulse intact. Soft compartments.  Skin:    General: Skin is warm and dry.  Neurological:     General: No focal deficit present.     Mental Status: She is alert.  Psychiatric:        Mood and Affect: Mood normal.        Behavior: Behavior normal.     ED Results / Procedures / Treatments   Labs (all labs ordered are listed, but only abnormal results are displayed) Labs Reviewed  CBC WITH DIFFERENTIAL/PLATELET - Abnormal; Notable for the following components:      Result Value   WBC 16.5 (*)    RDW 16.3 (*)    Neutro Abs 14.1 (*)    Abs Immature Granulocytes 0.12 (*)    All other components within normal limits  BASIC METABOLIC PANEL - Abnormal; Notable for the following components:   Potassium 3.2 (*)    Glucose, Bld 139 (*)    Creatinine, Ser 1.35 (*)    Calcium 8.2 (*)    GFR, Estimated 39 (*)    All other components within normal limits    EKG None  Radiology CT Shoulder Right Wo Contrast  Result Date: 03/17/2023 CLINICAL DATA:  Shoulder trauma. Recent fall. Fracture of humerus or scapula. EXAM: CT OF THE UPPER RIGHT EXTREMITY WITHOUT CONTRAST TECHNIQUE: Multidetector CT imaging of the right shoulder was performed according to the standard  protocol. RADIATION DOSE REDUCTION: This exam was performed according to the departmental dose-optimization program which includes automated exposure control, adjustment of the mA and/or kV according to patient size and/or use of iterative reconstruction technique. COMPARISON:  Radiographs 03/17/2023. FINDINGS: Bones/Joint/Cartilage The bones appear demineralized. There is a mildly impacted and mildly displaced fracture of the surgical neck of the right humerus. Fracture extends superior laterally into the greater tuberosity. There is no involvement of the humeral head articular surface. There is no dislocation or scapular fracture. The right clavicle is intact. Mild  underlying glenohumeral and acromioclavicular degenerative changes with a small shoulder joint lipohemarthrosis. Ligaments Suboptimally assessed by CT. Muscles and Tendons No focal muscular atrophy or fluid collection. Soft tissues No periarticular fluid collections, foreign bodies or soft tissue emphysema. The visualized right lung demonstrates no suspicious findings, although images are mildly degraded by breathing. Apparent moderate-sized hiatal hernia, incompletely visualized. IMPRESSION: 1. Mildly impacted and displaced fracture of the surgical neck of the right humerus with extension into the greater tuberosity. No involvement of the humeral head articular surface. 2. No dislocation or scapular fracture. 3. Mild underlying glenohumeral and acromioclavicular degenerative changes with a small shoulder joint lipohemarthrosis. 4. Apparent moderate-sized hiatal hernia, incompletely visualized. Electronically Signed   By: Carey Bullocks M.D.   On: 03/17/2023 14:11   CT Head Wo Contrast  Result Date: 03/17/2023 CLINICAL DATA:  Head trauma, minor (Age >= 65y); Facial trauma, blunt; Neck trauma (Age >= 65y). EXAM: CT HEAD WITHOUT CONTRAST CT MAXILLOFACIAL WITHOUT CONTRAST CT CERVICAL SPINE WITHOUT CONTRAST TECHNIQUE: Multidetector CT imaging of the  head, cervical spine, and maxillofacial structures were performed using the standard protocol without intravenous contrast. Multiplanar CT image reconstructions of the cervical spine and maxillofacial structures were also generated. RADIATION DOSE REDUCTION: This exam was performed according to the departmental dose-optimization program which includes automated exposure control, adjustment of the mA and/or kV according to patient size and/or use of iterative reconstruction technique. COMPARISON:  Head CT 09/21/2022. FINDINGS: CT HEAD FINDINGS Brain: No acute hemorrhage. Unchanged mild chronic small-vessel disease. Cortical gray-white differentiation is otherwise preserved. Prominence of the ventricles and sulci within expected range for age. No hydrocephalus or extra-axial collection. No mass effect or midline shift. Vascular: No hyperdense vessel or unexpected calcification. Skull: No calvarial fracture or suspicious bone lesion. Skull base is unremarkable. Other: Retained metallic object in the left scalp soft tissues. CT MAXILLOFACIAL FINDINGS Osseous: No fracture or mandibular dislocation. No destructive process. Orbits: Negative. No traumatic or inflammatory finding. Sinuses: Chronic left sphenoid sinusitis. Soft tissues: Unremarkable. CT CERVICAL SPINE FINDINGS Alignment: Normal. Skull base and vertebrae: No acute fracture. Normal craniocervical junction. No suspicious bone lesions. Soft tissues and spinal canal: No prevertebral fluid or swelling. No visible canal hematoma. Disc levels: Mild cervical spondylosis without high-grade spinal canal stenosis. Upper chest: Unremarkable. Other: None. IMPRESSION: 1. No acute intracranial abnormality. 2. No acute facial fracture. 3. No acute cervical spine fracture or traumatic listhesis. Electronically Signed   By: Orvan Falconer M.D.   On: 03/17/2023 13:27   CT Cervical Spine Wo Contrast  Result Date: 03/17/2023 CLINICAL DATA:  Head trauma, minor (Age >= 65y);  Facial trauma, blunt; Neck trauma (Age >= 65y). EXAM: CT HEAD WITHOUT CONTRAST CT MAXILLOFACIAL WITHOUT CONTRAST CT CERVICAL SPINE WITHOUT CONTRAST TECHNIQUE: Multidetector CT imaging of the head, cervical spine, and maxillofacial structures were performed using the standard protocol without intravenous contrast. Multiplanar CT image reconstructions of the cervical spine and maxillofacial structures were also generated. RADIATION DOSE REDUCTION: This exam was performed according to the departmental dose-optimization program which includes automated exposure control, adjustment of the mA and/or kV according to patient size and/or use of iterative reconstruction technique. COMPARISON:  Head CT 09/21/2022. FINDINGS: CT HEAD FINDINGS Brain: No acute hemorrhage. Unchanged mild chronic small-vessel disease. Cortical gray-white differentiation is otherwise preserved. Prominence of the ventricles and sulci within expected range for age. No hydrocephalus or extra-axial collection. No mass effect or midline shift. Vascular: No hyperdense vessel or unexpected calcification. Skull: No calvarial fracture or suspicious bone lesion. Skull  base is unremarkable. Other: Retained metallic object in the left scalp soft tissues. CT MAXILLOFACIAL FINDINGS Osseous: No fracture or mandibular dislocation. No destructive process. Orbits: Negative. No traumatic or inflammatory finding. Sinuses: Chronic left sphenoid sinusitis. Soft tissues: Unremarkable. CT CERVICAL SPINE FINDINGS Alignment: Normal. Skull base and vertebrae: No acute fracture. Normal craniocervical junction. No suspicious bone lesions. Soft tissues and spinal canal: No prevertebral fluid or swelling. No visible canal hematoma. Disc levels: Mild cervical spondylosis without high-grade spinal canal stenosis. Upper chest: Unremarkable. Other: None. IMPRESSION: 1. No acute intracranial abnormality. 2. No acute facial fracture. 3. No acute cervical spine fracture or traumatic  listhesis. Electronically Signed   By: Orvan Falconer M.D.   On: 03/17/2023 13:27   CT Maxillofacial Wo Contrast  Result Date: 03/17/2023 CLINICAL DATA:  Head trauma, minor (Age >= 65y); Facial trauma, blunt; Neck trauma (Age >= 65y). EXAM: CT HEAD WITHOUT CONTRAST CT MAXILLOFACIAL WITHOUT CONTRAST CT CERVICAL SPINE WITHOUT CONTRAST TECHNIQUE: Multidetector CT imaging of the head, cervical spine, and maxillofacial structures were performed using the standard protocol without intravenous contrast. Multiplanar CT image reconstructions of the cervical spine and maxillofacial structures were also generated. RADIATION DOSE REDUCTION: This exam was performed according to the departmental dose-optimization program which includes automated exposure control, adjustment of the mA and/or kV according to patient size and/or use of iterative reconstruction technique. COMPARISON:  Head CT 09/21/2022. FINDINGS: CT HEAD FINDINGS Brain: No acute hemorrhage. Unchanged mild chronic small-vessel disease. Cortical gray-white differentiation is otherwise preserved. Prominence of the ventricles and sulci within expected range for age. No hydrocephalus or extra-axial collection. No mass effect or midline shift. Vascular: No hyperdense vessel or unexpected calcification. Skull: No calvarial fracture or suspicious bone lesion. Skull base is unremarkable. Other: Retained metallic object in the left scalp soft tissues. CT MAXILLOFACIAL FINDINGS Osseous: No fracture or mandibular dislocation. No destructive process. Orbits: Negative. No traumatic or inflammatory finding. Sinuses: Chronic left sphenoid sinusitis. Soft tissues: Unremarkable. CT CERVICAL SPINE FINDINGS Alignment: Normal. Skull base and vertebrae: No acute fracture. Normal craniocervical junction. No suspicious bone lesions. Soft tissues and spinal canal: No prevertebral fluid or swelling. No visible canal hematoma. Disc levels: Mild cervical spondylosis without high-grade  spinal canal stenosis. Upper chest: Unremarkable. Other: None. IMPRESSION: 1. No acute intracranial abnormality. 2. No acute facial fracture. 3. No acute cervical spine fracture or traumatic listhesis. Electronically Signed   By: Orvan Falconer M.D.   On: 03/17/2023 13:27   DG Shoulder Right  Result Date: 03/17/2023 CLINICAL DATA:  Pain after fall EXAM: RIGHT SHOULDER - 3 VIEW COMPARISON:  None Available. FINDINGS: Osteopenia. Degenerative changes of the [facility] joint with joint space loss and hypertrophic changes areas the impacted comminuted fracture of the humeral neck with component along the greater tuberosity of the humeral head. On the lateral view of the humeral head is slightly anterior to the glenoid. Please correlate with a component of subluxation or dislocation. IMPRESSION: Osteopenia.  Comminuted fracture of the humeral head and neck. On the scapular Y-view the humeral head is slightly anterior to the glenoid. Please correlate for a component of subluxation or dislocation Electronically Signed   By: Karen Kays M.D.   On: 03/17/2023 12:43    Procedures .Critical Care  Performed by: Mannie Stabile, PA-C Authorized by: Mannie Stabile, PA-C   Critical care provider statement:    Critical care time (minutes):  33   Critical care was necessary to treat or prevent imminent or life-threatening deterioration of the following  conditions:  Trauma   Critical care was time spent personally by me on the following activities:  Development of treatment plan with patient or surrogate, discussions with consultants, evaluation of patient's response to treatment, examination of patient, ordering and review of laboratory studies, ordering and review of radiographic studies, ordering and performing treatments and interventions, pulse oximetry, re-evaluation of patient's condition and review of old charts   I assumed direction of critical care for this patient from another provider in my specialty: no        Medications Ordered in ED Medications  HYDROmorphone (DILAUDID) injection 1 mg (has no administration in time range)  potassium chloride SA (KLOR-CON M) CR tablet 20 mEq (has no administration in time range)  morphine (PF) 4 MG/ML injection 4 mg (4 mg Intravenous Given 03/17/23 1223)    ED Course/ Medical Decision Making/ A&P                             Medical Decision Making Amount and/or Complexity of Data Reviewed Independent Historian: EMS Labs: ordered. Decision-making details documented in ED Course. Radiology: ordered and independent interpretation performed. Decision-making details documented in ED Course.  Risk Prescription drug management.   This patient presents to the ED for concern of fall, this involves an extensive number of treatment options, and is a complaint that carries with it a high risk of complications and morbidity.  The differential diagnosis includes intracranial bleed, bony fracture, dislocation, etc  83 year old female presents to the ED after a fall on Eliquis.  Patient activated as a level 2 trauma prior to arrival.  Patient admits to tripping as she was walking up the stairs landing on her right shoulder and hitting her head.  No LOC.  Admits to right shoulder pain.  No other injuries.  Denies headache, nausea, vomiting.  No visual changes or speech changes.  Upon arrival, stable vitals.  Patient in no acute distress.  Tenderness throughout right shoulder with decreased range of motion.  Radial pulse intact.  Soft compartments.  Low suspicion for compartment syndrome.  Normal neurological exam.  CT head, cervical spine, and maxillofacial ordered.  X-ray ordered to rule out bony fracture of right shoulder.  IV morphine given. Discussed with Dr. Adela Lank who evaluated patient during initial evaluation.   CBC significant for leukocytosis at 16.5 likely reactive.  Normal hemoglobin.  BMP significant for hypokalemia at 3.2.  Potassium repleted.  Creatinine  mildly elevated 1.35.  Normal BUN.  Elevated glucose at 139.  No anion gap.  Right shoulder x-ray personally reviewed and interpreted which demonstrates a comminuted fracture of the humeral head and neck possible dislocation versus subluxation.  Discussed with Earney Hamburg, PA-C with orthopedics who recommends CT right shoulder.  CT head, cervical spine, maxillofacial negative for any acute abnormalities.  CT right shoulder demonstrates mildly impacted displaced fracture of surgical neck of the right humerus into the greater tuberosity.  No dislocation or scapular fracture.  Patient placed in sling after discussion with Earney Hamburg, PA-C with orthopedics. Patient has seen Dr. Lequita Halt in the past. Advised patient to call office today to schedule an appointment for further evaluation. Patient able to ambulate in the ED without difficulty. Patient discharged with pain medication. Strict ED precautions discussed with patient. Patient states understanding and agrees to plan. Patient discharged home in no acute distress and stable vitals  Has PCP Lives at home       Final Clinical Impression(s) /  ED Diagnoses Final diagnoses:  Fall, initial encounter  Injury of head, initial encounter  Closed displaced fracture of surgical neck of right humerus, unspecified fracture morphology, initial encounter    Rx / DC Orders ED Discharge Orders          Ordered    HYDROcodone-acetaminophen (NORCO/VICODIN) 5-325 MG tablet  Every 6 hours PRN        03/17/23 1447              Mannie Stabile, PA-C 03/17/23 1451    Melene Plan, DO 03/17/23 1458

## 2023-03-17 NOTE — Discharge Instructions (Addendum)
It was a pleasure taking care of you today. As discussed, you broke your right arm. Keep splint on until evaluated by your orthopedic surgeon. Call today to schedule an appointment for further evaluation. I am sending you home with pain medication. Take as needed for pain.  Pain medication can cause drowsiness so do not drive or operate machinery while the medication.  Return to the ER for new or worsening symptoms.

## 2023-03-17 NOTE — ED Triage Notes (Signed)
From home via ems with c/o trip and fall from standing. Struck left face on step, denies LOC. Denies NV. Denies visual changes. C/O right shoulder pain. Distal CMS intact.  Patient alert and calm. VSS.  fentanyl administered by EMS.

## 2023-03-17 NOTE — ED Notes (Signed)
Trauma Event Note  Pt was activated as Level 2 Fall on thinners. This TRN was with another pt when this pt arrived.  Pt is alert/oriented x 4, Primary RN and family at bedside. Will follow    Last imported Vital Signs BP 137/84   Pulse 78   Temp 97.6 F (36.4 C) (Oral)   Resp 19   Ht 4\' 10"  (1.473 m)   Wt 175 lb (79.4 kg)   SpO2 95%   BMI 36.58 kg/m   Trending CBC No results for input(s): "WBC", "HGB", "HCT", "PLT" in the last 72 hours.  Trending Coag's No results for input(s): "APTT", "INR" in the last 72 hours.  Trending BMET No results for input(s): "NA", "K", "CL", "CO2", "BUN", "CREATININE", "GLUCOSE" in the last 72 hours.    Cheryl Goodman  Trauma Response RN  Please call TRN at [phone] for further assistance.

## 2023-03-21 DIAGNOSIS — S42202D Unspecified fracture of upper end of left humerus, subsequent encounter for fracture with routine healing: Secondary | ICD-10-CM | POA: Diagnosis not present

## 2023-03-23 DIAGNOSIS — M25511 Pain in right shoulder: Secondary | ICD-10-CM | POA: Diagnosis not present

## 2023-03-23 DIAGNOSIS — N183 Chronic kidney disease, stage 3 unspecified: Secondary | ICD-10-CM | POA: Diagnosis not present

## 2023-03-23 DIAGNOSIS — D6859 Other primary thrombophilia: Secondary | ICD-10-CM | POA: Diagnosis not present

## 2023-03-23 DIAGNOSIS — Z9181 History of falling: Secondary | ICD-10-CM | POA: Diagnosis not present

## 2023-03-24 ENCOUNTER — Telehealth: Payer: Self-pay | Admitting: *Deleted

## 2023-03-24 NOTE — Telephone Encounter (Signed)
Transition Care Management Unsuccessful Follow-up Telephone Call  Date of discharge and from where:  [place] 03/18/2023  Attempts:  2nd Attempt  Reason for unsuccessful TCM follow-up call:  Unable to leave message

## 2023-03-24 NOTE — Telephone Encounter (Signed)
Transition Care Management Unsuccessful Follow-up Telephone Call  Date of discharge and from where:  [place] [facility] 03/18/2023  Attempts:  1st Attempt  Reason for unsuccessful TCM follow-up call:  Left voice message

## 2023-04-11 DIAGNOSIS — M25511 Pain in right shoulder: Secondary | ICD-10-CM | POA: Diagnosis not present

## 2023-04-12 ENCOUNTER — Inpatient Hospital Stay: Payer: Medicare HMO | Admitting: Internal Medicine

## 2023-04-12 ENCOUNTER — Inpatient Hospital Stay: Payer: Medicare HMO

## 2023-04-12 ENCOUNTER — Inpatient Hospital Stay: Payer: Medicare HMO | Attending: Internal Medicine

## 2023-04-12 VITALS — BP 110/90 | HR 80 | Temp 99.1°F | Wt 179.5 lb

## 2023-04-12 DIAGNOSIS — Z8673 Personal history of transient ischemic attack (TIA), and cerebral infarction without residual deficits: Secondary | ICD-10-CM | POA: Insufficient documentation

## 2023-04-12 DIAGNOSIS — K317 Polyp of stomach and duodenum: Secondary | ICD-10-CM | POA: Insufficient documentation

## 2023-04-12 DIAGNOSIS — D500 Iron deficiency anemia secondary to blood loss (chronic): Secondary | ICD-10-CM

## 2023-04-12 DIAGNOSIS — K449 Diaphragmatic hernia without obstruction or gangrene: Secondary | ICD-10-CM | POA: Insufficient documentation

## 2023-04-12 DIAGNOSIS — Z7901 Long term (current) use of anticoagulants: Secondary | ICD-10-CM | POA: Diagnosis not present

## 2023-04-12 DIAGNOSIS — Z86718 Personal history of other venous thrombosis and embolism: Secondary | ICD-10-CM | POA: Diagnosis not present

## 2023-04-12 DIAGNOSIS — K219 Gastro-esophageal reflux disease without esophagitis: Secondary | ICD-10-CM | POA: Insufficient documentation

## 2023-04-12 DIAGNOSIS — D509 Iron deficiency anemia, unspecified: Secondary | ICD-10-CM | POA: Insufficient documentation

## 2023-04-12 DIAGNOSIS — I10 Essential (primary) hypertension: Secondary | ICD-10-CM | POA: Insufficient documentation

## 2023-04-12 DIAGNOSIS — K5730 Diverticulosis of large intestine without perforation or abscess without bleeding: Secondary | ICD-10-CM | POA: Diagnosis not present

## 2023-04-12 DIAGNOSIS — M810 Age-related osteoporosis without current pathological fracture: Secondary | ICD-10-CM | POA: Insufficient documentation

## 2023-04-12 DIAGNOSIS — Z79899 Other long term (current) drug therapy: Secondary | ICD-10-CM | POA: Diagnosis not present

## 2023-04-12 DIAGNOSIS — E785 Hyperlipidemia, unspecified: Secondary | ICD-10-CM | POA: Diagnosis not present

## 2023-04-12 DIAGNOSIS — Z86711 Personal history of pulmonary embolism: Secondary | ICD-10-CM | POA: Diagnosis not present

## 2023-04-12 LAB — CBC WITH DIFFERENTIAL/PLATELET
Abs Immature Granulocytes: 0.05 10*3/uL (ref 0.00–0.07)
Basophils Absolute: 0.1 10*3/uL (ref 0.0–0.1)
Basophils Relative: 1 %
Eosinophils Absolute: 0.1 10*3/uL (ref 0.0–0.5)
Eosinophils Relative: 1 %
HCT: 42.1 % (ref 36.0–46.0)
Hemoglobin: 13.6 g/dL (ref 12.0–15.0)
Immature Granulocytes: 1 %
Lymphocytes Relative: 19 %
Lymphs Abs: 2 10*3/uL (ref 0.7–4.0)
MCH: 29.1 pg (ref 26.0–34.0)
MCHC: 32.3 g/dL (ref 30.0–36.0)
MCV: 90.1 fL (ref 80.0–100.0)
Monocytes Absolute: 1 10*3/uL (ref 0.1–1.0)
Monocytes Relative: 9 %
Neutro Abs: 7.4 10*3/uL (ref 1.7–7.7)
Neutrophils Relative %: 69 %
Platelets: 286 10*3/uL (ref 150–400)
RBC: 4.67 MIL/uL (ref 3.87–5.11)
RDW: 15.1 % (ref 11.5–15.5)
WBC: 10.6 10*3/uL — ABNORMAL HIGH (ref 4.0–10.5)
nRBC: 0 % (ref 0.0–0.2)

## 2023-04-12 LAB — IRON AND TIBC
Iron: 51 ug/dL (ref 28–170)
Saturation Ratios: 14 % (ref 10.4–31.8)
TIBC: 378 ug/dL (ref 250–450)
UIBC: 327 ug/dL

## 2023-04-12 LAB — FERRITIN: Ferritin: 206 ng/mL (ref 11–307)

## 2023-04-12 NOTE — Progress Notes (Signed)
[facility]  [phone]) [phone] [phone]  ID: Cheryl Goodman OB: 12-05-1940  MR#: [id number]  AOZ#:[id number]  Patient Care Team: Danella Penton, MD as PCP - General (Internal Medicine) Kemper Durie, RN as [facility] Care Management  REFERRING PROVIDER: Dr. Hyacinth Meeker  REASON FOR REFERRAL: Iron deficiency anemia  HPI: Cheryl Goodman is a 82 y.o. 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  Patient was admitted at [facility] from 12/27/2022 to 12/29/2022 for melanotic stools.  Upper endoscopy done by Dr. Lorenso Quarry showed 10 cm hiatal hernia with nonbleeding Sheria Lang erosion x 2 at the diaphragmatic pinch.  Mild inflammation in gastric antrum and multiple gastric polyps.  H. pylori testing was negative.  Last colonoscopy was in July 2022 which showed diverticulosis in the sigmoid and transverse colon otherwise normal.  Had small bowel endoscopy in March 2024 which did not show any active bleeding.  She has received 1 dose of Feraheme in 2020 and ferrilict in December 2023.  Interval history Patient seen today as follow-up for iron deficiency anemia and labs She was in the emergency room few weeks ago after a fall.  Was found to have mildly displaced right shoulder.  On conservative management.  Per patient she was told it has been healing well. She feels fatigued since the event.  Denies any bleeding in urine or stool.  REVIEW OF SYSTEMS:   Review of Systems  Constitutional:  Positive for malaise/fatigue.  Musculoskeletal:  Positive for falls.    As per HPI. Otherwise, a complete review of systems is negative.  PAST MEDICAL HISTORY: Past Medical History:  Diagnosis Date   Acute pulmonary embolism with acute cor pulmonale (HCC) 08/10/2019   Anemia    Arthritis    Dysphagia    GERD  (gastroesophageal reflux disease)    History of hiatal hernia    History of kidney stones    x2 ; passed independently   Hyperlipidemia    Hypertension    Nausea    OA (osteoarthritis) of knee 10/31/2017   Sleep apnea    Stroke [facility])     PAST SURGICAL HISTORY: Past Surgical History:  Procedure Laterality Date   ABDOMINAL HYSTERECTOMY     APPENDECTOMY     BIOPSY  12/28/2022   Procedure: BIOPSY;  Surgeon: Lynann Bologna, DO;  Location: [facility] ENDOSCOPY;  Service: Gastroenterology;;   BREAST BIOPSY Right    BREAST SURGERY     CARPAL TUNNEL RELEASE     CHOLECYSTECTOMY     COLONOSCOPY WITH PROPOFOL N/A 09/01/2015   Procedure: COLONOSCOPY WITH PROPOFOL;  Surgeon: Scot Jun, MD;  Location: [facility] ENDOSCOPY;  Service: Endoscopy;  Laterality: N/A;   COLONOSCOPY WITH PROPOFOL N/A 04/29/2021   Procedure: COLONOSCOPY WITH PROPOFOL;  Surgeon: Earline Mayotte, MD;  Location: [facility] ENDOSCOPY;  Service: Gastroenterology;  Laterality: N/A;   ESOPHAGOGASTRODUODENOSCOPY N/A 07/17/2019   Procedure: ESOPHAGOGASTRODUODENOSCOPY (EGD);  Surgeon: Pasty Spillers, MD;  Location: [facility] ENDOSCOPY;  Service: Endoscopy;  Laterality: N/A;   ESOPHAGOGASTRODUODENOSCOPY N/A 12/28/2022   Procedure: ESOPHAGOGASTRODUODENOSCOPY (EGD);  Surgeon: Lynann Bologna, DO;  Location: [facility] ENDOSCOPY;  Service: Gastroenterology;  Laterality: N/A;   ESOPHAGOGASTRODUODENOSCOPY (EGD) WITH PROPOFOL N/A 04/29/2021   Procedure: ESOPHAGOGASTRODUODENOSCOPY (EGD) WITH PROPOFOL;  Surgeon: Earline Mayotte, MD;  Location: [facility] ENDOSCOPY;  Service: Gastroenterology;  Laterality: N/A;   ESOPHAGOGASTRODUODENOSCOPY (EGD) WITH PROPOFOL N/A 09/21/2022  Procedure: ESOPHAGOGASTRODUODENOSCOPY (EGD) WITH PROPOFOL;  Surgeon: Willis Modena, MD;  Location: [facility] ENDOSCOPY;  Service: Gastroenterology;  Laterality: N/A;   IR KYPHO LUMBAR INC FX REDUCE BONE BX UNI/BIL CANNULATION INC/IMAGING  10/19/2022   IR RADIOLOGIST EVAL & MGMT  11/04/2022    REDUCTION MAMMAPLASTY Bilateral    SAVORY DILATION N/A 09/01/2015   Procedure: SAVORY DILATION;  Surgeon: Scot Jun, MD;  Location: [facility] ENDOSCOPY;  Service: Endoscopy;  Laterality: N/A;   TOTAL KNEE ARTHROPLASTY Right 10/31/2017   Procedure: RIGHT TOTAL KNEE ARTHROPLASTY;  Surgeon: Ollen Gross, MD;  Location: [facility] ORS;  Service: Orthopedics;  Laterality: Right;    FAMILY HISTORY: Family History  Problem Relation Age of Onset   Breast cancer Maternal Grandmother        in 51's   Heart disease Neg Hx     HEALTH MAINTENANCE: Social History   Tobacco Use   Smoking status: Never   Smokeless tobacco: Never  Vaping Use   Vaping Use: Never used  Substance Use Topics   Alcohol use: No   Drug use: No     Allergies  Allergen Reactions   Sulfa Antibiotics Anaphylaxis and Swelling   Dexlansoprazole Other (See Comments)    Syncope    Ace Inhibitors Cough   Oxycodone Nausea Only   Penicillins Rash    Has patient had a PCN reaction causing immediate rash, facial/tongue/throat swelling, SOB or lightheadedness with hypotension: No Has patient had a PCN reaction causing severe rash involving mucus membranes or skin necrosis: No Has patient had a PCN reaction that required hospitalization: No Has patient had a PCN reaction occurring within the last 10 years: No If all of the above answers are "NO", then may proceed with Cephalosporin use.     Current Outpatient Medications  Medication Sig Dispense Refill   acetaminophen (TYLENOL) 325 MG tablet Take 325-650 mg by mouth every 6 (six) hours as needed for headache or mild pain.     acetaminophen-codeine (TYLENOL #3) 300-30 MG tablet Take 1 tablet by mouth every 4 (four) hours as needed for moderate pain.     apixaban (ELIQUIS) 2.5 MG TABS tablet Take 2.5 mg by mouth 2 (two) times daily.     Cholecalciferol (VITAMIN D3) 125 MCG (5000 UT) TABS Take 5,000 Units by mouth daily.     HYDROcodone-acetaminophen (NORCO/VICODIN) 5-325 MG  tablet Take 1 tablet by mouth every 6 (six) hours as needed. 8 tablet 0   PROLIA 60 MG/ML SOSY injection Inject 60 mg into the skin every 6 (six) months.     simvastatin (ZOCOR) 20 MG tablet Take 20 mg by mouth at bedtime.     sucralfate (CARAFATE) 1 g tablet Take 1 g by mouth in the morning and at bedtime.     traMADol (ULTRAM) 50 MG tablet Take 50 mg by mouth in the morning.     triamterene-hydrochlorothiazide (DYAZIDE) 37.5-25 MG capsule Take 1 capsule by mouth daily.     azithromycin (ZITHROMAX) 250 MG tablet Take by mouth. (Patient not taking: Reported on 03/17/2023)     clopidogrel (PLAVIX) 75 MG tablet Take 75 mg by mouth daily. (Patient not taking: Reported on 01/11/2023)     DULoxetine (CYMBALTA) 20 MG capsule Take 20 mg by mouth daily. (Patient not taking: Reported on 01/11/2023)     pantoprazole (PROTONIX) 40 MG tablet Take 1 tablet (40 mg total) by mouth 2 (two) times daily before a meal. (Patient not taking: Reported on 03/17/2023) 60 tablet 1   polyethylene  glycol (MIRALAX / GLYCOLAX) 17 g packet Take 17 g by mouth daily. (Patient not taking: Reported on 01/11/2023) 30 each 0   No current facility-administered medications for this visit.    OBJECTIVE: Vitals:   04/12/23 1350  BP: (!) 110/90  Pulse: 80  Temp: 99.1 F (37.3 C)  SpO2: 97%     Body mass index is 37.52 kg/m.      General: Well-developed, well-nourished, no acute distress. Eyes: Pink conjunctiva, anicteric sclera. HEENT: Normocephalic, moist mucous membranes, clear oropharnyx. Lungs: Clear to auscultation bilaterally. Heart: Regular rate and rhythm. No rubs, murmurs, or gallops. Abdomen: Soft, nontender, nondistended. No organomegaly noted, normoactive bowel sounds. Musculoskeletal: No edema, cyanosis, or clubbing. Neuro: Alert, answering all questions appropriately. Cranial nerves grossly intact. Skin: No rashes or petechiae noted. Psych: Normal affect. Lymphatics: No cervical, calvicular, axillary or inguinal  LAD.   LAB RESULTS:  Lab Results  Component Value Date   NA 140 03/17/2023   K 3.2 (L) 03/17/2023   CL 104 03/17/2023   CO2 22 03/17/2023   GLUCOSE 139 (H) 03/17/2023   BUN 15 03/17/2023   CREATININE 1.35 (H) 03/17/2023   CALCIUM 8.2 (L) 03/17/2023   PROT 5.1 (L) 12/28/2022   ALBUMIN 2.8 (L) 12/28/2022   AST 14 (L) 12/28/2022   ALT 14 12/28/2022   ALKPHOS 49 12/28/2022   BILITOT 0.9 12/28/2022   GFRNONAA 39 (L) 03/17/2023   GFRAA >60 08/06/2019    Lab Results  Component Value Date   WBC 10.6 (H) 04/12/2023   NEUTROABS 7.4 04/12/2023   HGB 13.6 04/12/2023   HCT 42.1 04/12/2023   MCV 90.1 04/12/2023   PLT 286 04/12/2023    Lab Results  Component Value Date   TIBC 459 (H) 12/27/2022   TIBC 441 09/19/2022   TIBC 415 07/17/2019   FERRITIN 14 12/27/2022   FERRITIN 3 (L) 07/17/2019   IRONPCTSAT 9 (L) 12/27/2022   IRONPCTSAT 6 (L) 09/19/2022   IRONPCTSAT 3 (L) 07/17/2019     STUDIES: CT Shoulder Right Wo Contrast  Result Date: 03/17/2023 CLINICAL DATA:  Shoulder trauma. Recent fall. Fracture of humerus or scapula. EXAM: CT OF THE UPPER RIGHT EXTREMITY WITHOUT CONTRAST TECHNIQUE: Multidetector CT imaging of the right shoulder was performed according to the standard protocol. RADIATION DOSE REDUCTION: This exam was performed according to the departmental dose-optimization program which includes automated exposure control, adjustment of the mA and/or kV according to patient size and/or use of iterative reconstruction technique. COMPARISON:  Radiographs 03/17/2023. FINDINGS: Bones/Joint/Cartilage The bones appear demineralized. There is a mildly impacted and mildly displaced fracture of the surgical neck of the right humerus. Fracture extends superior laterally into the greater tuberosity. There is no involvement of the humeral head articular surface. There is no dislocation or scapular fracture. The right clavicle is intact. Mild underlying glenohumeral and acromioclavicular  degenerative changes with a small shoulder joint lipohemarthrosis. Ligaments Suboptimally assessed by CT. Muscles and Tendons No focal muscular atrophy or fluid collection. Soft tissues No periarticular fluid collections, foreign bodies or soft tissue emphysema. The visualized right lung demonstrates no suspicious findings, although images are mildly degraded by breathing. Apparent moderate-sized hiatal hernia, incompletely visualized. IMPRESSION: 1. Mildly impacted and displaced fracture of the surgical neck of the right humerus with extension into the greater tuberosity. No involvement of the humeral head articular surface. 2. No dislocation or scapular fracture. 3. Mild underlying glenohumeral and acromioclavicular degenerative changes with a small shoulder joint lipohemarthrosis. 4. Apparent moderate-sized hiatal hernia, incompletely visualized.  Electronically Signed   By: Carey Bullocks M.D.   On: 03/17/2023 14:11   CT Head Wo Contrast  Result Date: 03/17/2023 CLINICAL DATA:  Head trauma, minor (Age >= 65y); Facial trauma, blunt; Neck trauma (Age >= 65y). EXAM: CT HEAD WITHOUT CONTRAST CT MAXILLOFACIAL WITHOUT CONTRAST CT CERVICAL SPINE WITHOUT CONTRAST TECHNIQUE: Multidetector CT imaging of the head, cervical spine, and maxillofacial structures were performed using the standard protocol without intravenous contrast. Multiplanar CT image reconstructions of the cervical spine and maxillofacial structures were also generated. RADIATION DOSE REDUCTION: This exam was performed according to the departmental dose-optimization program which includes automated exposure control, adjustment of the mA and/or kV according to patient size and/or use of iterative reconstruction technique. COMPARISON:  Head CT 09/21/2022. FINDINGS: CT HEAD FINDINGS Brain: No acute hemorrhage. Unchanged mild chronic small-vessel disease. Cortical gray-white differentiation is otherwise preserved. Prominence of the ventricles and sulci  within expected range for age. No hydrocephalus or extra-axial collection. No mass effect or midline shift. Vascular: No hyperdense vessel or unexpected calcification. Skull: No calvarial fracture or suspicious bone lesion. Skull base is unremarkable. Other: Retained metallic object in the left scalp soft tissues. CT MAXILLOFACIAL FINDINGS Osseous: No fracture or mandibular dislocation. No destructive process. Orbits: Negative. No traumatic or inflammatory finding. Sinuses: Chronic left sphenoid sinusitis. Soft tissues: Unremarkable. CT CERVICAL SPINE FINDINGS Alignment: Normal. Skull base and vertebrae: No acute fracture. Normal craniocervical junction. No suspicious bone lesions. Soft tissues and spinal canal: No prevertebral fluid or swelling. No visible canal hematoma. Disc levels: Mild cervical spondylosis without high-grade spinal canal stenosis. Upper chest: Unremarkable. Other: None. IMPRESSION: 1. No acute intracranial abnormality. 2. No acute facial fracture. 3. No acute cervical spine fracture or traumatic listhesis. Electronically Signed   By: Orvan Falconer M.D.   On: 03/17/2023 13:27   CT Cervical Spine Wo Contrast  Result Date: 03/17/2023 CLINICAL DATA:  Head trauma, minor (Age >= 65y); Facial trauma, blunt; Neck trauma (Age >= 65y). EXAM: CT HEAD WITHOUT CONTRAST CT MAXILLOFACIAL WITHOUT CONTRAST CT CERVICAL SPINE WITHOUT CONTRAST TECHNIQUE: Multidetector CT imaging of the head, cervical spine, and maxillofacial structures were performed using the standard protocol without intravenous contrast. Multiplanar CT image reconstructions of the cervical spine and maxillofacial structures were also generated. RADIATION DOSE REDUCTION: This exam was performed according to the departmental dose-optimization program which includes automated exposure control, adjustment of the mA and/or kV according to patient size and/or use of iterative reconstruction technique. COMPARISON:  Head CT 09/21/2022. FINDINGS:  CT HEAD FINDINGS Brain: No acute hemorrhage. Unchanged mild chronic small-vessel disease. Cortical gray-white differentiation is otherwise preserved. Prominence of the ventricles and sulci within expected range for age. No hydrocephalus or extra-axial collection. No mass effect or midline shift. Vascular: No hyperdense vessel or unexpected calcification. Skull: No calvarial fracture or suspicious bone lesion. Skull base is unremarkable. Other: Retained metallic object in the left scalp soft tissues. CT MAXILLOFACIAL FINDINGS Osseous: No fracture or mandibular dislocation. No destructive process. Orbits: Negative. No traumatic or inflammatory finding. Sinuses: Chronic left sphenoid sinusitis. Soft tissues: Unremarkable. CT CERVICAL SPINE FINDINGS Alignment: Normal. Skull base and vertebrae: No acute fracture. Normal craniocervical junction. No suspicious bone lesions. Soft tissues and spinal canal: No prevertebral fluid or swelling. No visible canal hematoma. Disc levels: Mild cervical spondylosis without high-grade spinal canal stenosis. Upper chest: Unremarkable. Other: None. IMPRESSION: 1. No acute intracranial abnormality. 2. No acute facial fracture. 3. No acute cervical spine fracture or traumatic listhesis. Electronically Signed   By: Zollie Beckers  Wiggins M.D.   On: 03/17/2023 13:27   CT Maxillofacial Wo Contrast  Result Date: 03/17/2023 CLINICAL DATA:  Head trauma, minor (Age >= 65y); Facial trauma, blunt; Neck trauma (Age >= 65y). EXAM: CT HEAD WITHOUT CONTRAST CT MAXILLOFACIAL WITHOUT CONTRAST CT CERVICAL SPINE WITHOUT CONTRAST TECHNIQUE: Multidetector CT imaging of the head, cervical spine, and maxillofacial structures were performed using the standard protocol without intravenous contrast. Multiplanar CT image reconstructions of the cervical spine and maxillofacial structures were also generated. RADIATION DOSE REDUCTION: This exam was performed according to the departmental dose-optimization program which  includes automated exposure control, adjustment of the mA and/or kV according to patient size and/or use of iterative reconstruction technique. COMPARISON:  Head CT 09/21/2022. FINDINGS: CT HEAD FINDINGS Brain: No acute hemorrhage. Unchanged mild chronic small-vessel disease. Cortical gray-white differentiation is otherwise preserved. Prominence of the ventricles and sulci within expected range for age. No hydrocephalus or extra-axial collection. No mass effect or midline shift. Vascular: No hyperdense vessel or unexpected calcification. Skull: No calvarial fracture or suspicious bone lesion. Skull base is unremarkable. Other: Retained metallic object in the left scalp soft tissues. CT MAXILLOFACIAL FINDINGS Osseous: No fracture or mandibular dislocation. No destructive process. Orbits: Negative. No traumatic or inflammatory finding. Sinuses: Chronic left sphenoid sinusitis. Soft tissues: Unremarkable. CT CERVICAL SPINE FINDINGS Alignment: Normal. Skull base and vertebrae: No acute fracture. Normal craniocervical junction. No suspicious bone lesions. Soft tissues and spinal canal: No prevertebral fluid or swelling. No visible canal hematoma. Disc levels: Mild cervical spondylosis without high-grade spinal canal stenosis. Upper chest: Unremarkable. Other: None. IMPRESSION: 1. No acute intracranial abnormality. 2. No acute facial fracture. 3. No acute cervical spine fracture or traumatic listhesis. Electronically Signed   By: Orvan Falconer M.D.   On: 03/17/2023 13:27   DG Shoulder Right  Result Date: 03/17/2023 CLINICAL DATA:  Pain after fall EXAM: RIGHT SHOULDER - 3 VIEW COMPARISON:  None Available. FINDINGS: Osteopenia. Degenerative changes of the [facility] joint with joint space loss and hypertrophic changes areas the impacted comminuted fracture of the humeral neck with component along the greater tuberosity of the humeral head. On the lateral view of the humeral head is slightly anterior to the glenoid. Please  correlate with a component of subluxation or dislocation. IMPRESSION: Osteopenia.  Comminuted fracture of the humeral head and neck. On the scapular Y-view the humeral head is slightly anterior to the glenoid. Please correlate for a component of subluxation or dislocation Electronically Signed   By: Karen Kays M.D.   On: 03/17/2023 12:43    ASSESSMENT AND PLAN:   Cheryl Goodman is a 82 y.o. female with pmh of DVT/PE in November 2020, GERD, hypertension, hyperlipidemia, stroke in November 2023 was referred to hematology for management of iron deficiency anemia.  # Iron deficiency anemia  - Admitted at [facility] from 12/27/2022 to 12/29/2022 for melanotic stools.  Upper endoscopy done by Dr. Lorenso Quarry showed 10 cm hiatal hernia with nonbleeding Sheria Lang erosion x 2 at the diaphragmatic pinch.  Mild inflammation in gastric antrum and multiple gastric polyps.  H. pylori testing was negative.  Last colonoscopy was in July 2022 which showed diverticulosis in the sigmoid and transverse colon otherwise normal.  Capsule endoscopy in March 2024 with with no evidence of bleeding.  -Insurance did not cover Feraheme.  She received IV Venofer 200 mg weekly x 5 doses in April 2024.  Hemoglobin has normalized to 13.6.  Iron level is pending.  I will hold off on iron infusion today.  If her ferritin or saturation level comes back low we will schedule her for infusions.  # History of unprovoked DVT/PE # History of stroke -On Eliquis.  Was dose reduced to 2.5 mg twice daily by Dr. Hyacinth Meeker after GI bleed.  # GERD -On Protonix twice daily.  # Hypertension-on triamterene HCTZ.  # Osteoporosis-follows with Endo  Orders Placed This Encounter  Procedures   CBC with Differential (Cancer Center Only)   Iron and TIBC([facility])   Ferritin   RTC in 6 months for MD visit, labs  Patient expressed understanding and was in agreement with this plan. She also understands that She can call clinic at any time  with any questions, concerns, or complaints.   I spent a total of 25 minutes reviewing chart data, face-to-face evaluation with the patient, counseling and coordination of care as detailed above.  Michaelyn Barter, MD   04/12/2023 3:45 PM

## 2023-05-02 DIAGNOSIS — M25511 Pain in right shoulder: Secondary | ICD-10-CM | POA: Diagnosis not present

## 2023-05-17 DIAGNOSIS — S32039D Unspecified fracture of third lumbar vertebra, subsequent encounter for fracture with routine healing: Secondary | ICD-10-CM | POA: Diagnosis not present

## 2023-05-17 DIAGNOSIS — M1712 Unilateral primary osteoarthritis, left knee: Secondary | ICD-10-CM | POA: Diagnosis not present

## 2023-05-21 ENCOUNTER — Encounter (HOSPITAL_COMMUNITY): Payer: Self-pay

## 2023-05-21 ENCOUNTER — Other Ambulatory Visit: Payer: Self-pay

## 2023-05-21 ENCOUNTER — Emergency Department (HOSPITAL_COMMUNITY): Payer: Medicare HMO

## 2023-05-21 ENCOUNTER — Inpatient Hospital Stay (HOSPITAL_COMMUNITY)
Admission: EM | Admit: 2023-05-21 | Discharge: 2023-05-23 | DRG: 378 | Disposition: A | Discharge status: Home / Self Care | Payer: Medicare HMO | Attending: Internal Medicine | Admitting: Internal Medicine

## 2023-05-21 DIAGNOSIS — Z7902 Long term (current) use of antithrombotics/antiplatelets: Secondary | ICD-10-CM

## 2023-05-21 DIAGNOSIS — Z79620 Long term (current) use of immunosuppressive biologic: Secondary | ICD-10-CM | POA: Diagnosis not present

## 2023-05-21 DIAGNOSIS — Z6837 Body mass index (BMI) 37.0-37.9, adult: Secondary | ICD-10-CM | POA: Diagnosis not present

## 2023-05-21 DIAGNOSIS — K922 Gastrointestinal hemorrhage, unspecified: Secondary | ICD-10-CM | POA: Diagnosis not present

## 2023-05-21 DIAGNOSIS — K5732 Diverticulitis of large intestine without perforation or abscess without bleeding: Secondary | ICD-10-CM | POA: Diagnosis not present

## 2023-05-21 DIAGNOSIS — Z79899 Other long term (current) drug therapy: Secondary | ICD-10-CM | POA: Diagnosis not present

## 2023-05-21 DIAGNOSIS — Z803 Family history of malignant neoplasm of breast: Secondary | ICD-10-CM | POA: Diagnosis not present

## 2023-05-21 DIAGNOSIS — R531 Weakness: Secondary | ICD-10-CM | POA: Diagnosis not present

## 2023-05-21 DIAGNOSIS — Q630 Accessory kidney: Secondary | ICD-10-CM | POA: Diagnosis not present

## 2023-05-21 DIAGNOSIS — K317 Polyp of stomach and duodenum: Secondary | ICD-10-CM | POA: Diagnosis present

## 2023-05-21 DIAGNOSIS — Z9049 Acquired absence of other specified parts of digestive tract: Secondary | ICD-10-CM | POA: Diagnosis not present

## 2023-05-21 DIAGNOSIS — Z8719 Personal history of other diseases of the digestive system: Secondary | ICD-10-CM | POA: Diagnosis not present

## 2023-05-21 DIAGNOSIS — Z90710 Acquired absence of both cervix and uterus: Secondary | ICD-10-CM | POA: Diagnosis not present

## 2023-05-21 DIAGNOSIS — K449 Diaphragmatic hernia without obstruction or gangrene: Secondary | ICD-10-CM | POA: Diagnosis not present

## 2023-05-21 DIAGNOSIS — Z882 Allergy status to sulfonamides status: Secondary | ICD-10-CM

## 2023-05-21 DIAGNOSIS — Z86711 Personal history of pulmonary embolism: Secondary | ICD-10-CM | POA: Diagnosis present

## 2023-05-21 DIAGNOSIS — K2970 Gastritis, unspecified, without bleeding: Secondary | ICD-10-CM | POA: Diagnosis not present

## 2023-05-21 DIAGNOSIS — I129 Hypertensive chronic kidney disease with stage 1 through stage 4 chronic kidney disease, or unspecified chronic kidney disease: Secondary | ICD-10-CM | POA: Diagnosis present

## 2023-05-21 DIAGNOSIS — M1990 Unspecified osteoarthritis, unspecified site: Secondary | ICD-10-CM | POA: Diagnosis present

## 2023-05-21 DIAGNOSIS — Z880 Allergy status to penicillin: Secondary | ICD-10-CM

## 2023-05-21 DIAGNOSIS — R1084 Generalized abdominal pain: Secondary | ICD-10-CM | POA: Diagnosis not present

## 2023-05-21 DIAGNOSIS — K921 Melena: Secondary | ICD-10-CM | POA: Diagnosis not present

## 2023-05-21 DIAGNOSIS — K5792 Diverticulitis of intestine, part unspecified, without perforation or abscess without bleeding: Secondary | ICD-10-CM | POA: Diagnosis present

## 2023-05-21 DIAGNOSIS — E669 Obesity, unspecified: Secondary | ICD-10-CM | POA: Diagnosis present

## 2023-05-21 DIAGNOSIS — Z885 Allergy status to narcotic agent status: Secondary | ICD-10-CM

## 2023-05-21 DIAGNOSIS — N1831 Chronic kidney disease, stage 3a: Secondary | ICD-10-CM | POA: Diagnosis present

## 2023-05-21 DIAGNOSIS — D500 Iron deficiency anemia secondary to blood loss (chronic): Secondary | ICD-10-CM | POA: Diagnosis not present

## 2023-05-21 DIAGNOSIS — E785 Hyperlipidemia, unspecified: Secondary | ICD-10-CM | POA: Diagnosis present

## 2023-05-21 DIAGNOSIS — I10 Essential (primary) hypertension: Secondary | ICD-10-CM | POA: Diagnosis present

## 2023-05-21 DIAGNOSIS — Z7901 Long term (current) use of anticoagulants: Secondary | ICD-10-CM | POA: Diagnosis not present

## 2023-05-21 DIAGNOSIS — K219 Gastro-esophageal reflux disease without esophagitis: Secondary | ICD-10-CM | POA: Diagnosis present

## 2023-05-21 DIAGNOSIS — Z8673 Personal history of transient ischemic attack (TIA), and cerebral infarction without residual deficits: Secondary | ICD-10-CM | POA: Diagnosis not present

## 2023-05-21 DIAGNOSIS — D62 Acute posthemorrhagic anemia: Secondary | ICD-10-CM | POA: Diagnosis not present

## 2023-05-21 DIAGNOSIS — K5733 Diverticulitis of large intestine without perforation or abscess with bleeding: Principal | ICD-10-CM | POA: Diagnosis present

## 2023-05-21 DIAGNOSIS — Z86718 Personal history of other venous thrombosis and embolism: Secondary | ICD-10-CM | POA: Diagnosis not present

## 2023-05-21 DIAGNOSIS — I728 Aneurysm of other specified arteries: Secondary | ICD-10-CM | POA: Diagnosis not present

## 2023-05-21 DIAGNOSIS — Z96651 Presence of right artificial knee joint: Secondary | ICD-10-CM | POA: Diagnosis present

## 2023-05-21 DIAGNOSIS — G4730 Sleep apnea, unspecified: Secondary | ICD-10-CM | POA: Diagnosis present

## 2023-05-21 DIAGNOSIS — Z87442 Personal history of urinary calculi: Secondary | ICD-10-CM

## 2023-05-21 DIAGNOSIS — K5793 Diverticulitis of intestine, part unspecified, without perforation or abscess with bleeding: Principal | ICD-10-CM

## 2023-05-21 DIAGNOSIS — Z888 Allergy status to other drugs, medicaments and biological substances status: Secondary | ICD-10-CM

## 2023-05-21 DIAGNOSIS — R5383 Other fatigue: Secondary | ICD-10-CM | POA: Diagnosis not present

## 2023-05-21 LAB — COMPREHENSIVE METABOLIC PANEL
ALT: 22 U/L (ref 0–44)
AST: 16 U/L (ref 15–41)
Albumin: 3.7 g/dL (ref 3.5–5.0)
Alkaline Phosphatase: 46 U/L (ref 38–126)
Anion gap: 7 (ref 5–15)
BUN: 53 mg/dL — ABNORMAL HIGH (ref 8–23)
CO2: 23 mmol/L (ref 22–32)
Calcium: 8.5 mg/dL — ABNORMAL LOW (ref 8.9–10.3)
Chloride: 106 mmol/L (ref 98–111)
Creatinine, Ser: 1.3 mg/dL — ABNORMAL HIGH (ref 0.44–1.00)
GFR, Estimated: 41 mL/min — ABNORMAL LOW (ref >60)
Glucose, Bld: 114 mg/dL — ABNORMAL HIGH (ref 70–99)
Potassium: 3.8 mmol/L (ref 3.5–5.1)
Sodium: 136 mmol/L (ref 135–145)
Total Bilirubin: 0.9 mg/dL (ref 0.3–1.2)
Total Protein: 6.4 g/dL — ABNORMAL LOW (ref 6.5–8.1)

## 2023-05-21 LAB — CBC WITH DIFFERENTIAL/PLATELET
Abs Immature Granulocytes: 0.19 10*3/uL — ABNORMAL HIGH (ref 0.00–0.07)
Basophils Absolute: 0 10*3/uL (ref 0.0–0.1)
Basophils Relative: 0 %
Eosinophils Absolute: 0 10*3/uL (ref 0.0–0.5)
Eosinophils Relative: 0 %
HCT: 37 % (ref 36.0–46.0)
Hemoglobin: 11.5 g/dL — ABNORMAL LOW (ref 12.0–15.0)
Immature Granulocytes: 1 %
Lymphocytes Relative: 11 %
Lymphs Abs: 1.8 10*3/uL (ref 0.7–4.0)
MCH: 28.3 pg (ref 26.0–34.0)
MCHC: 31.1 g/dL (ref 30.0–36.0)
MCV: 90.9 fL (ref 80.0–100.0)
Monocytes Absolute: 1.6 10*3/uL — ABNORMAL HIGH (ref 0.1–1.0)
Monocytes Relative: 9 %
Neutro Abs: 13.7 10*3/uL — ABNORMAL HIGH (ref 1.7–7.7)
Neutrophils Relative %: 79 %
Platelets: 261 10*3/uL (ref 150–400)
RBC: 4.07 MIL/uL (ref 3.87–5.11)
RDW: 14.4 % (ref 11.5–15.5)
WBC: 17.4 10*3/uL — ABNORMAL HIGH (ref 4.0–10.5)
nRBC: 0 % (ref 0.0–0.2)

## 2023-05-21 LAB — PROTIME-INR
INR: 1.1 (ref 0.8–1.2)
Prothrombin Time: 14.9 seconds (ref 11.4–15.2)

## 2023-05-21 LAB — HEMOGLOBIN AND HEMATOCRIT, BLOOD
HCT: 34.7 % — ABNORMAL LOW (ref 36.0–46.0)
Hemoglobin: 10.9 g/dL — ABNORMAL LOW (ref 12.0–15.0)

## 2023-05-21 LAB — LIPASE, BLOOD: Lipase: 38 U/L (ref 11–51)

## 2023-05-21 LAB — TYPE AND SCREEN
ABO/RH(D): B NEG
Antibody Screen: NEGATIVE

## 2023-05-21 LAB — POC OCCULT BLOOD, ED: Fecal Occult Bld: POSITIVE — AB

## 2023-05-21 LAB — APTT: aPTT: 24 seconds (ref 24–36)

## 2023-05-21 MED ORDER — ONDANSETRON HCL 4 MG/2ML IJ SOLN: 4.0000 mg | Freq: Four times a day (QID) | INTRAMUSCULAR | Status: DC | PRN

## 2023-05-21 MED ORDER — IOHEXOL 350 MG/ML SOLN
100.0000 mL | Freq: Once | INTRAVENOUS | Status: AC | PRN
  Administered 2023-05-21: 100 mL via INTRAVENOUS

## 2023-05-21 MED ORDER — ONDANSETRON HCL 4 MG PO TABS: 4.0000 mg | ORAL_TABLET | Freq: Four times a day (QID) | ORAL | Status: DC | PRN

## 2023-05-21 MED ORDER — SODIUM CHLORIDE 0.9 % IV SOLN
1.0000 g | Freq: Once | INTRAVENOUS | Status: AC
  Administered 2023-05-21: 1 g via INTRAVENOUS
  Filled 2023-05-21: qty 10

## 2023-05-21 MED ORDER — ACETAMINOPHEN 650 MG RE SUPP: 650.0000 mg | Freq: Four times a day (QID) | RECTAL | Status: DC | PRN

## 2023-05-21 MED ORDER — SODIUM CHLORIDE 0.9% FLUSH
3.0000 mL | Freq: Two times a day (BID) | INTRAVENOUS | Status: DC
  Administered 2023-05-21 – 2023-05-23 (×4): 3 mL via INTRAVENOUS

## 2023-05-21 MED ORDER — TRAMADOL HCL 50 MG PO TABS
50.0000 mg | ORAL_TABLET | Freq: Every morning | ORAL | Status: DC
  Administered 2023-05-22 – 2023-05-23 (×2): 50 mg via ORAL
  Filled 2023-05-21 (×2): qty 1

## 2023-05-21 MED ORDER — SIMVASTATIN 20 MG PO TABS
20.0000 mg | ORAL_TABLET | Freq: Every day | ORAL | Status: DC
  Administered 2023-05-22: 20 mg via ORAL
  Filled 2023-05-21: qty 1

## 2023-05-21 MED ORDER — METRONIDAZOLE 500 MG/100ML IV SOLN
500.0000 mg | Freq: Once | INTRAVENOUS | Status: AC
  Administered 2023-05-21: 500 mg via INTRAVENOUS
  Filled 2023-05-21: qty 100

## 2023-05-21 MED ORDER — LACTATED RINGERS IV SOLN: INTRAVENOUS | Status: AC

## 2023-05-21 MED ORDER — MELATONIN 3 MG PO TABS
3.0000 mg | ORAL_TABLET | Freq: Once | ORAL | Status: AC
  Administered 2023-05-21: 3 mg via ORAL
  Filled 2023-05-21: qty 1

## 2023-05-21 MED ORDER — METRONIDAZOLE 500 MG/100ML IV SOLN
500.0000 mg | Freq: Two times a day (BID) | INTRAVENOUS | Status: DC
  Administered 2023-05-22 – 2023-05-23 (×3): 500 mg via INTRAVENOUS
  Filled 2023-05-21 (×3): qty 100

## 2023-05-21 MED ORDER — SODIUM CHLORIDE 0.9 % IV SOLN
2.0000 g | INTRAVENOUS | Status: DC
  Administered 2023-05-22: 2 g via INTRAVENOUS
  Filled 2023-05-21: qty 20

## 2023-05-21 MED ORDER — PANTOPRAZOLE SODIUM 40 MG IV SOLR
40.0000 mg | Freq: Two times a day (BID) | INTRAVENOUS | Status: DC
  Administered 2023-05-21 – 2023-05-23 (×4): 40 mg via INTRAVENOUS
  Filled 2023-05-21 (×4): qty 10

## 2023-05-21 MED ORDER — ACETAMINOPHEN 325 MG PO TABS: 650.0000 mg | ORAL_TABLET | Freq: Four times a day (QID) | ORAL | Status: DC | PRN

## 2023-05-21 NOTE — ED Triage Notes (Signed)
Pt arrived from home by EMS for weakness after losing about a pint of bloody stool around 10 am today with abdominal pain increasing before and relief from pain once passing the bloody stool, but very weak after. V/s 128/78 68 18 98 RA  Pt a&o x 4

## 2023-05-21 NOTE — Hospital Course (Signed)
Cheryl Goodman is a 82 y.o. female with medical history significant for PE/DVT on Eliquis, history of CVA, CKD stage IIIa, HTN, HLD, history of recurrent GI bleed due to [facility] erosions, diverticulosis, iron deficiency anemia who is admitted for evaluation of upper GI bleed.

## 2023-05-21 NOTE — H&P (Signed)
History and Physical    Cheryl Goodman ZOX:[id number] DOB: 09-15-1941 DOA: 05/21/2023  PCP: Danella Penton, MD  Patient coming from: Home  I have personally briefly reviewed patient's old medical records in [facility]  Chief Complaint: Melena  HPI: Cheryl Goodman is a 82 y.o. female with medical history significant for PE/DVT on Eliquis, history of CVA, CKD stage IIIa, HTN, HLD, history of recurrent GI bleed due to [facility] erosions, diverticulosis, iron deficiency anemia who presented to the ED for evaluation of abdominal pain and dark black stool.  Patient states that she has been seeing dark black stool for the last 5 days.  She has been having some vague pain to her right lower back.  Last night she had significant midsternal heartburn pain.  Today she had an episode where she passed a large amount of dark black tarry stool.  She states he was nearly a pint of blood around 10 AM today.  She says her abdominal pain resolved afterwards but she has been feeling generally weak since.  She has had some lightheadedness but has not passed out or fallen.  She reports adherence to Eliquis with last dose taken morning of 8/17.  She has not had any nausea or vomiting.  She has not seen any bright red blood per rectum.  She is hoping to be well enough to attend her granddaughter's wedding next weekend.  ED Course  Labs/Imaging on admission: I have personally reviewed following labs and imaging studies.  Initial vitals showed BP 138/69, pulse 70, RR 18, temp 97.9 F, SpO2 100% on room air.  Labs show WBC 17.4, hemoglobin 11.5, platelets 261,000, sodium 136, potassium 3.8, bicarb 23, BUN 53, creatinine 1.30, serum glucose 114, LFTs within normal limits, lipase 38, INR 1.1.  FOBT is positive.  CT abdomen/pelvis shows pancolonic diverticulosis.  Focal thickening of the distal descending colon, suggesting mild, focal diverticulitis noted although underlying mass not excluded.  No  intraluminal contrast extravasation to localize GI bleeding.  Large hiatal hernia with complete intrathoracic position of the stomach and a small portion of the transverse colon incompletely imaged.  Patient was given IV ceftriaxone and Flagyl.  The hospitalist service was consulted to admit for further evaluation and management.  Review of Systems: All systems reviewed and are negative except as documented in history of present illness above.   Past Medical History:  Diagnosis Date   Acute pulmonary embolism with acute cor pulmonale (HCC) 08/10/2019   Anemia    Arthritis    Dysphagia    GERD (gastroesophageal reflux disease)    History of hiatal hernia    History of kidney stones    x2 ; passed independently   Hyperlipidemia    Hypertension    Nausea    OA (osteoarthritis) of knee 10/31/2017   Sleep apnea    Stroke [facility])     Past Surgical History:  Procedure Laterality Date   ABDOMINAL HYSTERECTOMY     APPENDECTOMY     BIOPSY  12/28/2022   Procedure: BIOPSY;  Surgeon: Lynann Bologna, DO;  Location: [facility] ENDOSCOPY;  Service: Gastroenterology;;   BREAST BIOPSY Right    BREAST SURGERY     CARPAL TUNNEL RELEASE     CHOLECYSTECTOMY     COLONOSCOPY WITH PROPOFOL N/A 09/01/2015   Procedure: COLONOSCOPY WITH PROPOFOL;  Surgeon: Scot Jun, MD;  Location: [facility] ENDOSCOPY;  Service: Endoscopy;  Laterality: N/A;   COLONOSCOPY WITH PROPOFOL N/A 04/29/2021   Procedure: COLONOSCOPY WITH  PROPOFOL;  Surgeon: Earline Mayotte, MD;  Location: [facility] ENDOSCOPY;  Service: Gastroenterology;  Laterality: N/A;   ESOPHAGOGASTRODUODENOSCOPY N/A 07/17/2019   Procedure: ESOPHAGOGASTRODUODENOSCOPY (EGD);  Surgeon: Pasty Spillers, MD;  Location: [facility] ENDOSCOPY;  Service: Endoscopy;  Laterality: N/A;   ESOPHAGOGASTRODUODENOSCOPY N/A 12/28/2022   Procedure: ESOPHAGOGASTRODUODENOSCOPY (EGD);  Surgeon: Lynann Bologna, DO;  Location: [facility] ENDOSCOPY;  Service: Gastroenterology;  Laterality: N/A;    ESOPHAGOGASTRODUODENOSCOPY (EGD) WITH PROPOFOL N/A 04/29/2021   Procedure: ESOPHAGOGASTRODUODENOSCOPY (EGD) WITH PROPOFOL;  Surgeon: Earline Mayotte, MD;  Location: [facility] ENDOSCOPY;  Service: Gastroenterology;  Laterality: N/A;   ESOPHAGOGASTRODUODENOSCOPY (EGD) WITH PROPOFOL N/A 09/21/2022   Procedure: ESOPHAGOGASTRODUODENOSCOPY (EGD) WITH PROPOFOL;  Surgeon: Willis Modena, MD;  Location: [facility] ENDOSCOPY;  Service: Gastroenterology;  Laterality: N/A;   IR KYPHO LUMBAR INC FX REDUCE BONE BX UNI/BIL CANNULATION INC/IMAGING  10/19/2022   IR RADIOLOGIST EVAL & MGMT  11/04/2022   REDUCTION MAMMAPLASTY Bilateral    SAVORY DILATION N/A 09/01/2015   Procedure: SAVORY DILATION;  Surgeon: Scot Jun, MD;  Location: [facility] ENDOSCOPY;  Service: Endoscopy;  Laterality: N/A;   TOTAL KNEE ARTHROPLASTY Right 10/31/2017   Procedure: RIGHT TOTAL KNEE ARTHROPLASTY;  Surgeon: Ollen Gross, MD;  Location: [facility] ORS;  Service: Orthopedics;  Laterality: Right;    Social History:  reports that she has never smoked. She has never used smokeless tobacco. No history on file for alcohol use and drug use.  Allergies  Allergen Reactions   Sulfa Antibiotics Anaphylaxis and Swelling   Dexlansoprazole Other (See Comments)    Syncope    Ace Inhibitors Cough   Oxycodone Nausea Only   Penicillins Rash    Has patient had a PCN reaction causing immediate rash, facial/tongue/throat swelling, SOB or lightheadedness with hypotension: No Has patient had a PCN reaction causing severe rash involving mucus membranes or skin necrosis: No Has patient had a PCN reaction that required hospitalization: No Has patient had a PCN reaction occurring within the last 10 years: No If all of the above answers are "NO", then may proceed with Cephalosporin use.     Family History  Problem Relation Age of Onset   Breast cancer Maternal Grandmother        in 69's   Heart disease Neg Hx      Prior to Admission medications    Medication Sig Start Date End Date Taking? Authorizing Provider  acetaminophen (TYLENOL) 325 MG tablet Take 325-650 mg by mouth every 6 (six) hours as needed for headache or mild pain.    [provider]  acetaminophen-codeine (TYLENOL #3) 300-30 MG tablet Take 1 tablet by mouth every 4 (four) hours as needed for moderate pain.    [provider]  apixaban (ELIQUIS) 2.5 MG TABS tablet Take 2.5 mg by mouth 2 (two) times daily.    [provider]  azithromycin (ZITHROMAX) 250 MG tablet Take by mouth. Patient not taking: Reported on 03/17/2023 01/04/23   [provider]  Cholecalciferol (VITAMIN D3) 125 MCG (5000 UT) TABS Take 5,000 Units by mouth daily.    [provider]  clopidogrel (PLAVIX) 75 MG tablet Take 75 mg by mouth daily. Patient not taking: Reported on 01/11/2023 12/24/22   [provider]  DULoxetine (CYMBALTA) 20 MG capsule Take 20 mg by mouth daily. Patient not taking: Reported on 01/11/2023 12/24/22   [provider]  HYDROcodone-acetaminophen (NORCO/VICODIN) 5-325 MG tablet Take 1 tablet by mouth every 6 (six) hours as needed. 03/17/23   Mannie Stabile, PA-C  pantoprazole (PROTONIX) 40 MG tablet Take 1 tablet (40 mg total) by mouth 2 (two) times daily before a meal. Patient not taking: Reported on 03/17/2023 12/29/22 03/17/23  Willeen Niece, MD  polyethylene glycol (MIRALAX / GLYCOLAX) 17 g packet Take 17 g by mouth daily. Patient not taking: Reported on 01/11/2023 08/07/19   Rodolph Bong, MD  PROLIA 60 MG/ML SOSY injection Inject 60 mg into the skin every 6 (six) months.    [provider]  simvastatin (ZOCOR) 20 MG tablet Take 20 mg by mouth at bedtime.    [provider]  sucralfate (CARAFATE) 1 g tablet Take 1 g by mouth in the morning and at bedtime.    [provider]  traMADol (ULTRAM) 50 MG tablet Take 50 mg by mouth in the morning.    [provider]   triamterene-hydrochlorothiazide (DYAZIDE) 37.5-25 MG capsule Take 1 capsule by mouth daily.    [provider]    Physical Exam: Vitals:   05/21/23 1354 05/21/23 1356 05/21/23 1657 05/21/23 2011  BP:  138/69 114/69 124/71  Pulse:  70 68 69  Resp:  18 16 16   Temp:  97.9 F (36.6 C) 98 F (36.7 C) 98.9 F (37.2 C)  TempSrc:  Oral Oral Oral  SpO2:  100% 99% 97%  Weight: 82.6 kg   81.3 kg  Height: 4\' 10"  (1.473 m)   4\' 10"  (1.473 m)   Constitutional: Resting in bed, NAD, calm, comfortable Eyes: EOMI, lids and conjunctivae normal ENMT: Mucous membranes are moist. Posterior pharynx clear of any exudate or lesions.Normal dentition.  Neck: normal, supple, no masses. Respiratory: clear to auscultation bilaterally, no wheezing, no crackles. Normal respiratory effort. No accessory muscle use.  Cardiovascular: Regular rate and rhythm, no murmurs / rubs / gallops. No extremity edema. 2+ pedal pulses. Abdomen: no tenderness, no masses palpated.  Musculoskeletal: no clubbing / cyanosis. No joint deformity upper and lower extremities. Good ROM, no contractures. Normal muscle tone.  Skin: no rashes, lesions, ulcers. No induration Neurologic:  Sensation intact. Strength 5/5 in all 4.  Psychiatric: Normal judgment and insight. Alert and oriented x 3. Normal mood.   EKG: Personally reviewed. Sinus rhythm, rate 72, PAC, no acute ischemic changes.  Similar to previous.  Assessment/Plan Principal Problem:   Acute upper GI bleed Active Problems:   Diverticulitis   Essential hypertension   Hyperlipidemia   History of ischemic stroke   Chronic kidney disease, stage 3a (HCC)   History of pulmonary embolism   Cheryl Goodman is a 82 y.o. female with medical history significant for PE/DVT on Eliquis, history of CVA, CKD stage IIIa, HTN, HLD, history of recurrent GI bleed due to [facility] erosions, diverticulosis, iron deficiency anemia who is admitted for evaluation of upper GI  bleed.  Assessment and Plan: Acute upper GI bleed: CTA A/P did not show localization of bleeding, pancolonic diverticulosis noted but history consistent with an upper GI bleed.  Presenting with melena, BUN elevated.  History of [place] erosions.  Hemoglobin stable at 11.5. -Hold Eliquis -Start IV Protonix 40 mg twice daily -Repeat H&H tonight, transfuse as needed (patient has consented) -[facility], Dr. Levora Angel, notified for routine consult in the morning -Will keep n.p.o. after midnight  Diverticulitis: CT suggestive of focal diverticulitis at the distal descending colon although underlying mass is not excluded.  WBC 17.4 on admission.   -Continue IV ceftriaxone and Flagyl -Continue IV fluid hydration overnight  History of PE/DVT History of CVA: Holding Eliquis as  above.  Continue simvastatin.  Hypertension: Hold home antihypertensive as she has had some borderline low blood pressures.  CKD stage IIIa: Creatinine slightly increased from baseline.  Continue IV fluid hydration overnight and monitor.  Hyperlipidemia: Continue simvastatin.   DVT prophylaxis: SCDs Start: 05/21/23 1939 Code Status: Full code, confirmed with patient on admission Family Communication: Discussed with patient, she has discussed with family Disposition Plan: From home and likely discharge to home pending clinical progress Consults called: [facility] Severity of Illness: The appropriate patient status for this patient is INPATIENT. Inpatient status is judged to be reasonable and necessary in order to provide the required intensity of service to ensure the patient's safety. The patient's presenting symptoms, physical exam findings, and initial radiographic and laboratory data in the context of their chronic comorbidities is felt to place them at high risk for further clinical deterioration. Furthermore, it is not anticipated that the patient will be medically stable for discharge from the hospital within 2  midnights of admission.   * I certify that at the point of admission it is my clinical judgment that the patient will require inpatient hospital care spanning beyond 2 midnights from the point of admission due to high intensity of service, high risk for further deterioration and high frequency of surveillance required.Darreld Mclean MD [facility]  If 7PM-7AM, please contact night-coverage [email or web address]  05/21/2023, 8:27 PM

## 2023-05-21 NOTE — ED Notes (Signed)
Patient transported to CT 

## 2023-05-21 NOTE — ED Provider Notes (Signed)
[place] EMERGENCY DEPARTMENT AT [facility] Provider Note   CSN: [id number] Arrival date & time: 05/21/23  1338     History  Chief Complaint  Patient presents with   Melena   GI Bleeding    PRISCA Goodman is a 82 y.o. female.  This is an 82 year old female who presents emergency department today due to melena.  Patient says that beginning 5 days ago, she has begun to have dark, tarry stools.  She says that after she has these, she does feel very weak.  Patient has had multiple hospitalizations for similar, has had endoscopy and diagnosed with gastritis.  Patient currently takes pantoprazole 2 times per day, she does take Eliquis.  I reviewed the patient's most recent hospital admission, discharge notes, gastroenterology notes.        Home Medications Prior to Admission medications   Medication Sig Start Date End Date Taking? Authorizing Provider  acetaminophen (TYLENOL) 325 MG tablet Take 325-650 mg by mouth every 6 (six) hours as needed for headache or mild pain.    [provider]  acetaminophen-codeine (TYLENOL #3) 300-30 MG tablet Take 1 tablet by mouth every 4 (four) hours as needed for moderate pain.    [provider]  apixaban (ELIQUIS) 2.5 MG TABS tablet Take 2.5 mg by mouth 2 (two) times daily.    [provider]  azithromycin (ZITHROMAX) 250 MG tablet Take by mouth. Patient not taking: Reported on 03/17/2023 01/04/23   [provider]  Cholecalciferol (VITAMIN D3) 125 MCG (5000 UT) TABS Take 5,000 Units by mouth daily.    [provider]  clopidogrel (PLAVIX) 75 MG tablet Take 75 mg by mouth daily. Patient not taking: Reported on 01/11/2023 12/24/22   [provider]  DULoxetine (CYMBALTA) 20 MG capsule Take 20 mg by mouth daily. Patient not taking: Reported on 01/11/2023 12/24/22   [provider]  HYDROcodone-acetaminophen (NORCO/VICODIN) 5-325 MG tablet Take 1 tablet by mouth every 6 (six)  hours as needed. 03/17/23   Mannie Stabile, PA-C  pantoprazole (PROTONIX) 40 MG tablet Take 1 tablet (40 mg total) by mouth 2 (two) times daily before a meal. Patient not taking: Reported on 03/17/2023 12/29/22 03/17/23  Willeen Niece, MD  polyethylene glycol (MIRALAX / GLYCOLAX) 17 g packet Take 17 g by mouth daily. Patient not taking: Reported on 01/11/2023 08/07/19   Rodolph Bong, MD  PROLIA 60 MG/ML SOSY injection Inject 60 mg into the skin every 6 (six) months.    [provider]  simvastatin (ZOCOR) 20 MG tablet Take 20 mg by mouth at bedtime.    [provider]  sucralfate (CARAFATE) 1 g tablet Take 1 g by mouth in the morning and at bedtime.    [provider]  traMADol (ULTRAM) 50 MG tablet Take 50 mg by mouth in the morning.    [provider]  triamterene-hydrochlorothiazide (DYAZIDE) 37.5-25 MG capsule Take 1 capsule by mouth daily.    [provider]      Allergies    Sulfa antibiotics, Dexlansoprazole, Ace inhibitors, Oxycodone, and Penicillins    Review of Systems   Review of Systems  Physical Exam Updated Vital Signs BP 138/69 (BP Location: Right Arm)   Pulse 70   Temp 97.9 F (36.6 C) (Oral)   Resp 18   Ht 4\' 10"  (1.473 m)   Wt 82.6 kg   SpO2 100%   BMI 38.04 kg/m  Physical Exam Vitals reviewed.  HENT:  Head: Normocephalic.  Cardiovascular:     Rate and Rhythm: Normal rate.  Pulmonary:     Effort: Pulmonary effort is normal.  Abdominal:     General: Abdomen is flat. There is no distension.     Palpations: Abdomen is soft.     Tenderness: There is no abdominal tenderness. There is no guarding.  Neurological:     General: No focal deficit present.     Mental Status: She is alert.     ED Results / Procedures / Treatments   Labs (all labs ordered are listed, but only abnormal results are displayed) Labs Reviewed  COMPREHENSIVE METABOLIC PANEL - Abnormal; Notable for the following components:       Result Value   Glucose, Bld 114 (*)    BUN 53 (*)    Creatinine, Ser 1.30 (*)    Calcium 8.5 (*)    Total Protein 6.4 (*)    GFR, Estimated 41 (*)    All other components within normal limits  CBC WITH DIFFERENTIAL/PLATELET - Abnormal; Notable for the following components:   WBC 17.4 (*)    Hemoglobin 11.5 (*)    Neutro Abs 13.7 (*)    Monocytes Absolute 1.6 (*)    Abs Immature Granulocytes 0.19 (*)    All other components within normal limits  POC OCCULT BLOOD, ED - Abnormal; Notable for the following components:   Fecal Occult Bld POSITIVE (*)    All other components within normal limits  LIPASE, BLOOD  PROTIME-INR  APTT  TYPE AND SCREEN    EKG None  Radiology CT Angio Abd/Pel W and/or Wo Contrast  Result Date: 05/21/2023 CLINICAL DATA:  Lower GI bleed EXAM: CTA ABDOMEN AND PELVIS WITHOUT AND WITH CONTRAST TECHNIQUE: Multidetector CT imaging of the abdomen and pelvis was performed using the standard protocol during bolus administration of intravenous contrast. Multiplanar reconstructed images and MIPs were obtained and reviewed to evaluate the vascular anatomy. RADIATION DOSE REDUCTION: This exam was performed according to the departmental dose-optimization program which includes automated exposure control, adjustment of the mA and/or kV according to patient size and/or use of iterative reconstruction technique. CONTRAST:  OMNIPAQUE IOHEXOL 350 MG/ML SOLN COMPARISON:  09/18/2022 FINDINGS: VASCULAR Normal contour and caliber of the abdominal aorta. No evidence of aneurysm, dissection, or other acute aortic pathology. 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 Mild aortic atherosclerosis. 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 Review of the MIP images confirms the above findings. NON-VASCULAR Lower  Chest: No acute findings. Large hiatal hernia with complete intrathoracic position of the stomach and a small portion of transverse colon, incompletely imaged. Hepatobiliary: No focal liver abnormality is seen. Status post cholecystectomy. No biliary dilatation. Pancreas: Unremarkable. No pancreatic ductal dilatation or surrounding inflammatory changes. Spleen: Normal in size without significant abnormality. Adrenals/Urinary Tract: Adrenal glands are unremarkable. Kidneys are normal, without renal calculi, solid lesion, or hydronephrosis. Small volume air in the urinary bladder, presumably secondary to recent catheterization. Stomach/Bowel: Stomach is within normal limits. Incidental descending duodenal diverticulum. Appendix appears normal. No evidence of bowel wall thickening, distention, or inflammatory changes. Pancolonic diverticulosis. Focal thickening of the distal descending colon (series 7, image 123, series 9, image 90). No intraluminal contrast extravasation. Lymphatic: No enlarged abdominal or pelvic lymph nodes. Reproductive: Status post hysterectomy. Other: No abdominal wall hernia or abnormality. No ascites. Musculoskeletal:  No acute osseous findings. IMPRESSION: 1. Pancolonic diverticulosis. Focal thickening of the distal descending colon, suggesting mild, focal diverticulitis, although underlying mass is not excluded. 2. No intraluminal contrast extravasation to specifically localize GI bleeding. 3. Large hiatal hernia with complete intrathoracic position of the stomach and a small portion of transverse colon, incompletely imaged. 4. 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 5. Small volume air in the urinary bladder, presumably secondary to recent catheterization. 6. Status post cholecystectomy and hysterectomy. Aortic Atherosclerosis (ICD10-I70.0). Electronically Signed   By: Jearld Lesch M.D.   On: 05/21/2023 16:26     Procedures Procedures    Medications Ordered in ED Medications  cefTRIAXone (ROCEPHIN) 1 g in sodium chloride 0.9 % 100 mL IVPB (has no administration in time range)  metroNIDAZOLE (FLAGYL) IVPB 500 mg (has no administration in time range)  iohexol (OMNIPAQUE) 350 MG/ML injection 100 mL (100 mLs Intravenous Contrast Given 05/21/23 1559)    ED Course/ Medical Decision Making/ A&P                                 Medical Decision Making 82 year old female here today for melena.  Differential diagnoses include gastritis, peptic ulcer disease, upper GI bleed, less likely lower GI bleed.  Plan -with the patient's history, prior scopes, this most likely recurrent gastritis.  Does have some abdominal pain as well.  Will obtain imaging the patient's abdomen pelvis.  Blood work ordered.  Deferred rectal exam given patient's history, unlikely to change management.  Anticipate admission for the patient.  Patient has a recorded allergy to Protonix which is listed as syncope.  Reassessment-patient with an elevated white count to 17.  CT imaging showing probable diverticulitis.  In this setting the patient's abdominal pain, melena,, white count I agree that this likely is diverticulitis.  Unclear if there is also overlying gastritis which is contributing to her symptoms.  With the patient being on blood thinners, having melena, and having diverticulitis, will plan to admit to hospitalist.  Amount and/or Complexity of Data Reviewed Labs: ordered. Radiology: ordered.  Risk Prescription drug management.           Final Clinical Impression(s) / ED Diagnoses Final diagnoses:  Gastrointestinal hemorrhage associated with intestinal diverticulitis    Rx / DC Orders ED Discharge Orders     None         Anders Simmonds T, DO 05/21/23 1643

## 2023-05-21 NOTE — ED Notes (Signed)
ED TO INPATIENT HANDOFF REPORT  ED Nurse Name and Phone #: Crist Infante, RN [phone]  S Name/Age/Gender Cheryl Goodman 82 y.o. female Room/Bed: WA16/WA16  Code Status   Code Status: Prior  Home/SNF/Other Home Patient oriented to: self, place, time, and situation Is this baseline? Yes   Triage Complete: Triage complete  Chief Complaint melena  Triage Note Pt arrived from home by EMS for weakness after losing about a pint of bloody stool around 10 am today with abdominal pain increasing before and relief from pain once passing the bloody stool, but very weak after. V/s 128/78 68 18 98 RA  Pt a&o x 4    Allergies Allergies  Allergen Reactions   Sulfa Antibiotics Anaphylaxis and Swelling   Dexlansoprazole Other (See Comments)    Syncope    Ace Inhibitors Cough   Oxycodone Nausea Only   Penicillins Rash    Has patient had a PCN reaction causing immediate rash, facial/tongue/throat swelling, SOB or lightheadedness with hypotension: No Has patient had a PCN reaction causing severe rash involving mucus membranes or skin necrosis: No Has patient had a PCN reaction that required hospitalization: No Has patient had a PCN reaction occurring within the last 10 years: No If all of the above answers are "NO", then may proceed with Cephalosporin use.     Level of Care/Admitting Diagnosis ED Disposition     ED Disposition  Admit   Condition  --   Comment  The patient appears reasonably stabilized for admission considering the current resources, flow, and capabilities available in the ED at this time, and I doubt any other [facility] requiring further screening and/or treatment in the ED prior to admission is  present.          B Medical/Surgery History Past Medical History:  Diagnosis Date   Acute pulmonary embolism with acute cor pulmonale (HCC) 08/10/2019   Anemia    Arthritis    Dysphagia    GERD (gastroesophageal reflux disease)    History of hiatal hernia     History of kidney stones    x2 ; passed independently   Hyperlipidemia    Hypertension    Nausea    OA (osteoarthritis) of knee 10/31/2017   Sleep apnea    Stroke [facility])    Past Surgical History:  Procedure Laterality Date   ABDOMINAL HYSTERECTOMY     APPENDECTOMY     BIOPSY  12/28/2022   Procedure: BIOPSY;  Surgeon: Lynann Bologna, DO;  Location: [facility] ENDOSCOPY;  Service: Gastroenterology;;   BREAST BIOPSY Right    BREAST SURGERY     CARPAL TUNNEL RELEASE     CHOLECYSTECTOMY     COLONOSCOPY WITH PROPOFOL N/A 09/01/2015   Procedure: COLONOSCOPY WITH PROPOFOL;  Surgeon: Scot Jun, MD;  Location: [facility] ENDOSCOPY;  Service: Endoscopy;  Laterality: N/A;   COLONOSCOPY WITH PROPOFOL N/A 04/29/2021   Procedure: COLONOSCOPY WITH PROPOFOL;  Surgeon: Earline Mayotte, MD;  Location: [facility] ENDOSCOPY;  Service: Gastroenterology;  Laterality: N/A;   ESOPHAGOGASTRODUODENOSCOPY N/A 07/17/2019   Procedure: ESOPHAGOGASTRODUODENOSCOPY (EGD);  Surgeon: Pasty Spillers, MD;  Location: [facility] ENDOSCOPY;  Service: Endoscopy;  Laterality: N/A;   ESOPHAGOGASTRODUODENOSCOPY N/A 12/28/2022   Procedure: ESOPHAGOGASTRODUODENOSCOPY (EGD);  Surgeon: Lynann Bologna, DO;  Location: [facility] END[facility]COPY;  Service: Gastroenterology;  Laterality: N/A;   ESOPHAGOGASTRODUODENOSCOPY (EGD) WITH PROPOFOL N/A 04/29/2021   Procedure: ESOPHAGOGASTRODUODENOSCOPY (EGD) WITH PROPOFOL;  Surgeon: Earline Mayotte, MD;  Location: [facility] ENDOSCOPY;  Service: Gastroenterology;  Laterality: N/A;   ESOPHAGOGASTRODUODENOSCOPY (  EGD) WITH PROPOFOL N/A 09/21/2022   Procedure: ESOPHAGOGASTRODUODENOSCOPY (EGD) WITH PROPOFOL;  Surgeon: Willis Modena, MD;  Location: [facility] ENDOSCOPY;  Service: Gastroenterology;  Laterality: N/A;   IR KYPHO LUMBAR INC FX REDUCE BONE BX UNI/BIL CANNULATION INC/IMAGING  10/19/2022   IR RADIOLOGIST EVAL & MGMT  11/04/2022   REDUCTION MAMMAPLASTY Bilateral    SAVORY DILATION N/A 09/01/2015   Procedure: SAVORY  DILATION;  Surgeon: Scot Jun, MD;  Location: [facility] ENDOSCOPY;  Service: Endoscopy;  Laterality: N/A;   TOTAL KNEE ARTHROPLASTY Right 10/31/2017   Procedure: RIGHT TOTAL KNEE ARTHROPLASTY;  Surgeon: Ollen Gross, MD;  Location: [facility] ORS;  Service: Orthopedics;  Laterality: Right;     A IV Location/Drains/Wounds Patient Lines/Drains/Airways Status     Active Line/Drains/Airways     Name Placement date Placement time Site Days   Peripheral IV 03/17/23 20 G 1.16" Left;Posterior Hand 03/17/23  --  Hand  65   Peripheral IV 05/21/23 20 G 1" Right Antecubital 05/21/23  1447  Antecubital  less than 1   Peripheral IV 05/21/23 22 G 1" Right;Posterior Hand 05/21/23  1720  Hand  less than 1            Intake/Output Last 24 hours No intake or output data in the 24 hours ending 05/21/23 1746  Labs/Imaging Results for orders placed or performed during the hospital encounter of 05/21/23 (from the past 48 hour(s))  Comprehensive metabolic panel     Status: Abnormal   Collection Time: 05/21/23  2:47 PM  Result Value Ref Range   Sodium 136 135 - 145 mmol/L   Potassium 3.8 3.5 - 5.1 mmol/L   Chloride 106 98 - 111 mmol/L   CO2 23 22 - 32 mmol/L   Glucose, Bld 114 (H) 70 - 99 mg/dL    Comment: Glucose reference range applies only to samples taken after fasting for at least 8 hours.   BUN 53 (H) 8 - 23 mg/dL   Creatinine, Ser 1.61 (H) 0.44 - 1.00 mg/dL   Calcium 8.5 (L) 8.9 - 10.3 mg/dL   Total Protein 6.4 (L) 6.5 - 8.1 g/dL   Albumin 3.7 3.5 - 5.0 g/dL   AST 16 15 - 41 U/L   ALT 22 0 - 44 U/L   Alkaline Phosphatase 46 38 - 126 U/L   Total Bilirubin 0.9 0.3 - 1.2 mg/dL   GFR, Estimated 41 (L) >60 mL/min    Comment: (NOTE) Calculated using the CKD-EPI Creatinine Equation (2021)    Anion gap 7 5 - 15    Comment: Performed at [facility], [address] [place], [place] [address]  Lipase, blood     Status: None   Collection Time: 05/21/23  2:47 PM  Result Value  Ref Range   Lipase 38 11 - 51 U/L    Comment: Performed at [facility], [address] [place], [place] [address]  CBC with Differential     Status: Abnormal   Collection Time: 05/21/23  2:47 PM  Result Value Ref Range   WBC 17.4 (H) 4.0 - 10.5 K/uL   RBC 4.07 3.87 - 5.11 MIL/uL   Hemoglobin 11.5 (L) 12.0 - 15.0 g/dL   HCT 11.9 14.7 - 82.9 %   MCV 90.9 80.0 - 100.0 fL   MCH 28.3 26.0 - 34.0 pg   MCHC 31.1 30.0 - 36.0 g/dL   RDW 56.2 13.0 - 86.5 %   Platelets 261 150 - 400 K/uL   nRBC  0.0 0.0 - 0.2 %   Neutrophils Relative % 79 %   Neutro Abs 13.7 (H) 1.7 - 7.7 K/uL   Lymphocytes Relative 11 %   Lymphs Abs 1.8 0.7 - 4.0 K/uL   Monocytes Relative 9 %   Monocytes Absolute 1.6 (H) 0.1 - 1.0 K/uL   Eosinophils Relative 0 %   Eosinophils Absolute 0.0 0.0 - 0.5 K/uL   Basophils Relative 0 %   Basophils Absolute 0.0 0.0 - 0.1 K/uL   Immature Granulocytes 1 %   Abs Immature Granulocytes 0.19 (H) 0.00 - 0.07 K/uL    Comment: Performed at [facility], [address] [place], [place] [address]  Protime-INR     Status: None   Collection Time: 05/21/23  2:47 PM  Result Value Ref Range   Prothrombin Time 14.9 11.4 - 15.2 seconds   INR 1.1 0.8 - 1.2    Comment: (NOTE) INR goal varies based on device and disease states. Performed at [facility], [address] [place], [place] [address]   Type and screen [facility]     Status: None   Collection Time: 05/21/23  2:47 PM  Result Value Ref Range   ABO/RH(D) B NEG    Antibody Screen NEG    Sample Expiration      05/24/2023,2359 Performed at [facility], [address], [place], [place] [address]   APTT     Status: None   Collection Time: 05/21/23  2:47 PM  Result Value Ref Range   aPTT 24 24 - 36 seconds    Comment: Performed at [facility], [address], [place], [place] [address]  POC occult blood, ED      Status: Abnormal   Collection Time: 05/21/23  3:08 PM  Result Value Ref Range   Fecal Occult Bld POSITIVE (A) NEGATIVE   CT Angio Abd/Pel W and/or Wo Contrast  Result Date: 05/21/2023 CLINICAL DATA:  Lower GI bleed EXAM: CTA ABDOMEN AND PELVIS WITHOUT AND WITH CONTRAST TECHNIQUE: Multidetector CT imaging of the abdomen and pelvis was performed using the standard protocol during bolus administration of intravenous contrast. Multiplanar reconstructed images and MIPs were obtained and reviewed to evaluate the vascular anatomy. RADIATION DOSE REDUCTION: This exam was performed according to the departmental dose-optimization program which includes automated exposure control, adjustment of the mA and/or kV according to patient size and/or use of iterative reconstruction technique. CONTRAST:  OMNIPAQUE IOHEXOL 350 MG/ML SOLN COMPARISON:  09/18/2022 FINDINGS: VASCULAR Normal contour and caliber of the abdominal aorta. No evidence of aneurysm, dissection, or other acute aortic pathology. 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 Mild aortic atherosclerosis. 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 Review of the MIP images confirms the above findings. NON-VASCULAR Lower Chest: No acute findings. Large hiatal hernia with complete intrathoracic position of the stomach and a small portion of transverse colon, incompletely imaged. Hepatobiliary: No focal liver abnormality is seen. Status post cholecystectomy. No biliary dilatation. Pancreas: Unremarkable. No pancreatic ductal dilatation or surrounding inflammatory changes. Spleen: Normal in size without significant abnormality. Adrenals/Urinary Tract: Adrenal glands are unremarkable. Kidneys are normal, without renal calculi, solid lesion, or hydronephrosis. Small volume air in the  urinary bladder, presumably secondary to recent catheterization. Stomach/Bowel: Stomach is within normal limits. Incidental descending duodenal diverticulum. Appendix appears  normal. No evidence of bowel wall thickening, distention, or inflammatory changes. Pancolonic diverticulosis. Focal thickening of the distal descending colon (series 7, image 123, series 9, image 90). No intraluminal contrast extravasation. Lymphatic: No enlarged abdominal or pelvic lymph nodes. Reproductive: Status post hysterectomy. Other: No abdominal wall hernia or abnormality. No ascites. Musculoskeletal: No acute osseous findings. IMPRESSION: 1. Pancolonic diverticulosis. Focal thickening of the distal descending colon, suggesting mild, focal diverticulitis, although underlying mass is not excluded. 2. No intraluminal contrast extravasation to specifically localize GI bleeding. 3. Large hiatal hernia with complete intrathoracic position of the stomach and a small portion of transverse colon, incompletely imaged. 4. 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 5. Small volume air in the urinary bladder, presumably secondary to recent catheterization. 6. Status post cholecystectomy and hysterectomy. Aortic Atherosclerosis (ICD10-I70.0). Electronically Signed   By: Jearld Lesch M.D.   On: 05/21/2023 16:26    Pending Labs Unresulted Labs (From admission, onward)    None       Vitals/Pain Today's Vitals   05/21/23 1349 05/21/23 1354 05/21/23 1356 05/21/23 1657  BP: 138/69  138/69 114/69  Pulse:   70 68  Resp:   18 16  Temp:   97.9 F (36.6 C) 98 F (36.7 C)  TempSrc:   Oral Oral  SpO2:   100% 99%  Weight:  82.6 kg    Height:  4\' 10"  (1.473 m)      Isolation Precautions No active isolations  Medications Medications  cefTRIAXone (ROCEPHIN) 1 g in sodium chloride 0.9 % 100 mL IVPB (1 g Intravenous New Bag/Given 05/21/23 1717)  metroNIDAZOLE (FLAGYL) IVPB 500 mg  (500 mg Intravenous New Bag/Given 05/21/23 1716)  iohexol (OMNIPAQUE) 350 MG/ML injection 100 mL (100 mLs Intravenous Contrast Given 05/21/23 1559)    Mobility walks     Focused Assessments Neuro Assessment Handoff:  Swallow screen pass?  N/A         Neuro Assessment:   Neuro Checks:      Has TPA been given? No If patient is a Neuro Trauma and patient is going to OR before floor call report to 4N Charge nurse: [phone] or [phone]   R Recommendations: See Admitting Provider Note  Report given to:   Additional Notes:

## 2023-05-22 DIAGNOSIS — K922 Gastrointestinal hemorrhage, unspecified: Secondary | ICD-10-CM | POA: Diagnosis not present

## 2023-05-22 LAB — CBC
HCT: 33.3 % — ABNORMAL LOW (ref 36.0–46.0)
Hemoglobin: 10.3 g/dL — ABNORMAL LOW (ref 12.0–15.0)
MCH: 28.9 pg (ref 26.0–34.0)
MCHC: 30.9 g/dL (ref 30.0–36.0)
MCV: 93.3 fL (ref 80.0–100.0)
Platelets: 232 10*3/uL (ref 150–400)
RBC: 3.57 MIL/uL — ABNORMAL LOW (ref 3.87–5.11)
RDW: 14.6 % (ref 11.5–15.5)
WBC: 13.8 10*3/uL — ABNORMAL HIGH (ref 4.0–10.5)
nRBC: 0 % (ref 0.0–0.2)

## 2023-05-22 LAB — BASIC METABOLIC PANEL
Anion gap: 7 (ref 5–15)
BUN: 35 mg/dL — ABNORMAL HIGH (ref 8–23)
CO2: 21 mmol/L — ABNORMAL LOW (ref 22–32)
Calcium: 8 mg/dL — ABNORMAL LOW (ref 8.9–10.3)
Chloride: 109 mmol/L (ref 98–111)
Creatinine, Ser: 1.1 mg/dL — ABNORMAL HIGH (ref 0.44–1.00)
GFR, Estimated: 50 mL/min — ABNORMAL LOW (ref >60)
Glucose, Bld: 111 mg/dL — ABNORMAL HIGH (ref 70–99)
Potassium: 4.1 mmol/L (ref 3.5–5.1)
Sodium: 137 mmol/L (ref 135–145)

## 2023-05-22 MED ORDER — MELATONIN 3 MG PO TABS
3.0000 mg | ORAL_TABLET | Freq: Every evening | ORAL | Status: DC | PRN
  Administered 2023-05-22: 3 mg via ORAL
  Filled 2023-05-22: qty 1

## 2023-05-22 MED ORDER — SODIUM CHLORIDE 0.9 % IV SOLN: INTRAVENOUS | Status: DC

## 2023-05-22 MED ORDER — VITAMIN D 25 MCG (1000 UNIT) PO TABS
5000.0000 [IU] | ORAL_TABLET | Freq: Every day | ORAL | Status: DC
  Administered 2023-05-22: 5000 [IU] via ORAL
  Filled 2023-05-22: qty 5

## 2023-05-22 MED ORDER — ORAL CARE MOUTH RINSE: 15.0000 mL | OROMUCOSAL | Status: DC | PRN

## 2023-05-22 NOTE — Consult Note (Signed)
Referring Provider: TH Primary Care Physician:  Danella Penton, MD Primary Gastroenterologist:  Dr. Dulce Sellar  Reason for Consultation: Melena  HPI: Cheryl Goodman is a 82 y.o. female history of PE and DVT on Eliquis, history of CVA, chronic kidney disease, history of GI bleed in the past which was thought to be from hemorrhoids erosions admitted to the hospital with black-colored stool and anemia.  Last dose of Eliquis on the morning of 8/17.  Her hemoglobin in March 2024 was 10.9.  Hemoglobin was normal in June and July of this year.  Hemoglobin on admission dropped to 11.5 yesterday and hemoglobin is 10.3 this morning.  Occult blood positive.  She was also found to have leukocytosis with white count of 17.4 on admission.  CT abdomen pelvis with IV contrast yesterday showed focal thickening of the distal descending colon could be mild diverticulitis although underlying mass cannot be excluded.  Also showed large hiatal hernia with complete intrathoracic stomach as well as small portion of transverse colon.  Patient had upper and right-sided abdominal pain radiating to the back which has resolved now.  Denies any vomiting.  Continues to have acid reflux.  Currently taking pantoprazole 40 mg daily.  She was admitted to the hospital with similar symptoms in March 2024.  EGD at that time showed 10 cm hiatal hernia with nonbleeding Cameron's erosions.  Also showed gastritis and gastric polyps.  She was advised to stay on Protonix 40 mg twice a day indefinitely because of recurrent bleeding. Also had EGD in December 2023 as well as  July 2022. Colonoscopy in July 2022 showed diverticulosis.  Colonoscopy done at [facility]  Past Medical History:  Diagnosis Date   Acute pulmonary embolism with acute cor pulmonale (HCC) 08/10/2019   Anemia    Arthritis    Dysphagia    GERD (gastroesophageal reflux disease)    History of hiatal hernia    History of kidney stones    x2 ; passed independently    Hyperlipidemia    Hypertension    Nausea    OA (osteoarthritis) of knee 10/31/2017   Sleep apnea    Stroke [facility])     Past Surgical History:  Procedure Laterality Date   ABDOMINAL HYSTERECTOMY     APPENDECTOMY     BIOPSY  12/28/2022   Procedure: BIOPSY;  Surgeon: Lynann Bologna, DO;  Location: [facility] ENDOSCOPY;  Service: Gastroenterology;;   BREAST BIOPSY Right    BREAST SURGERY     CARPAL TUNNEL RELEASE     CHOLECYSTECTOMY     COLONOSCOPY WITH PROPOFOL N/A 09/01/2015   Procedure: COLONOSCOPY WITH PROPOFOL;  Surgeon: Scot Jun, MD;  Location: [facility] ENDOSCOPY;  Service: Endoscopy;  Laterality: N/A;   COLONOSCOPY WITH PROPOFOL N/A 04/29/2021   Procedure: COLONOSCOPY WITH PROPOFOL;  Surgeon: Earline Mayotte, MD;  Location: [facility] ENDOSCOPY;  Service: Gastroenterology;  Laterality: N/A;   ESOPHAGOGASTRODUODENOSCOPY N/A 07/17/2019   Procedure: ESOPHAGOGASTRODUODENOSCOPY (EGD);  Surgeon: Pasty Spillers, MD;  Location: [facility] ENDOSCOPY;  Service: Endoscopy;  Laterality: N/A;   ESOPHAGOGASTRODUODENOSCOPY N/A 12/28/2022   Procedure: ESOPHAGOGASTRODUODENOSCOPY (EGD);  Surgeon: Lynann Bologna, DO;  Location: [facility] ENDOSCOPY;  Service: Gastroenterology;  Laterality: N/A;   ESOPHAGOGASTRODUODENOSCOPY (EGD) WITH PROPOFOL N/A 04/29/2021   Procedure: ESOPHAGOGASTRODUODENOSCOPY (EGD) WITH PROPOFOL;  Surgeon: Earline Mayotte, MD;  Location: [facility] ENDOSCOPY;  Service: Gastroenterology;  Laterality: N/A;   ESOPHAGOGASTRODUODENOSCOPY (EGD) WITH PROPOFOL N/A 09/21/2022   Procedure: ESOPHAGOGASTRODUODENOSCOPY (EGD) WITH PROPOFOL;  Surgeon: Willis Modena, MD;  Location: [facility]  ENDOSCOPY;  Service: Gastroenterology;  Laterality: N/A;   IR KYPHO LUMBAR INC FX REDUCE BONE BX UNI/BIL CANNULATION INC/IMAGING  10/19/2022   IR RADIOLOGIST EVAL & MGMT  11/04/2022   REDUCTION MAMMAPLASTY Bilateral    SAVORY DILATION N/A 09/01/2015   Procedure: SAVORY DILATION;  Surgeon: Scot Jun, MD;  Location: [facility]  ENDOSCOPY;  Service: Endoscopy;  Laterality: N/A;   TOTAL KNEE ARTHROPLASTY Right 10/31/2017   Procedure: RIGHT TOTAL KNEE ARTHROPLASTY;  Surgeon: Ollen Gross, MD;  Location: [facility] ORS;  Service: Orthopedics;  Laterality: Right;    Prior to Admission medications   Medication Sig Start Date End Date Taking? Authorizing Provider  acetaminophen (TYLENOL) 325 MG tablet Take 325-650 mg by mouth every 6 (six) hours as needed for headache or mild pain.   Yes [provider]  acetaminophen-codeine (TYLENOL #3) 300-30 MG tablet Take 1 tablet by mouth every 4 (four) hours as needed for moderate pain.   Yes [provider]  apixaban (ELIQUIS) 2.5 MG TABS tablet Take 2.5 mg by mouth 2 (two) times daily.   Yes [provider]  Cholecalciferol (VITAMIN D3) 125 MCG (5000 UT) TABS Take 5,000 Units by mouth daily.   Yes [provider]  HYDROcodone-acetaminophen (NORCO/VICODIN) 5-325 MG tablet Take 1 tablet by mouth every 6 (six) hours as needed. Patient taking differently: Take 1 tablet by mouth every 6 (six) hours as needed for moderate pain. 03/17/23  Yes Aberman, Merla Riches, PA-C  pantoprazole (PROTONIX) 40 MG tablet Take 1 tablet (40 mg total) by mouth 2 (two) times daily before a meal. 12/29/22 05/21/23 Yes Khatri, Alverda Skeans, MD  PROLIA 60 MG/ML SOSY injection Inject 60 mg into the skin every 6 (six) months.   Yes [provider]  simvastatin (ZOCOR) 20 MG tablet Take 20 mg by mouth at bedtime.   Yes [provider]  sucralfate (CARAFATE) 1 g tablet Take 1 g by mouth in the morning and at bedtime.   Yes [provider]  traMADol (ULTRAM) 50 MG tablet Take 50 mg by mouth in the morning.   Yes [provider]  triamterene-hydrochlorothiazide (DYAZIDE) 37.5-25 MG capsule Take 1 capsule by mouth daily.   Yes [provider]  polyethylene glycol (MIRALAX / GLYCOLAX) 17 g packet Take 17 g by mouth daily. Patient not taking: Reported on  01/11/2023 08/07/19   Rodolph Bong, MD    Scheduled Meds:  pantoprazole (PROTONIX) IV  40 mg Intravenous Q12H   simvastatin  20 mg Oral QHS   sodium chloride flush  3 mL Intravenous Q12H   traMADol  50 mg Oral q AM   Continuous Infusions:  cefTRIAXone (ROCEPHIN)  IV     metronidazole 500 mg (05/22/23 0642)   PRN Meds:.acetaminophen **OR** acetaminophen, ondansetron **OR** ondansetron (ZOFRAN) IV, mouth rinse  Allergies as of 05/21/2023 - Review Complete 05/21/2023  Allergen Reaction Noted   Sulfa antibiotics Anaphylaxis and Swelling 08/14/2015   Dexlansoprazole Other (See Comments) 08/14/2015   Ace inhibitors Cough 11/05/2015   Oxycodone Nausea Only 12/19/2017   Penicillins Rash 08/14/2015    Family History  Problem Relation Age of Onset   Breast cancer Maternal Grandmother        in 65's   Heart disease Neg Hx     Social History   Socioeconomic History   Marital status: Married    Spouse name: Not on file   Number of children: Not on file   Years of education: Not on file   Highest  education level: Not on file  Occupational History   Not on file  Tobacco Use   Smoking status: Never   Smokeless tobacco: Never  Vaping Use   Vaping status: Never Used  Substance and Sexual Activity   Alcohol use: Not on file   Drug use: Not on file   Sexual activity: Not Currently    Birth control/protection: None    Comment: intercourse age 46, less than 5 sexual partners  Other Topics Concern   Not on file  Social History Narrative   Not on file   Social Determinants of Health   Financial Resource Strain: Low Risk  (03/23/2023)   [facility], [facility]   Overall Financial Resource Strain (CARDIA)    Difficulty of Paying Living Expenses: Not hard at all  Food Insecurity: No Food Insecurity (05/21/2023)   Hunger Vital Sign    Worried About Running Out of Food in the Last Year: Never true    Ran Out of Food in the Last  Year: Never true  Transportation Needs: No Transportation Needs (05/22/2023)   PRAPARE - Administrator, Civil Service (Medical): No    Lack of Transportation (Non-Medical): No  Physical Activity: Not on file  Stress: Not on file  Social Connections: Not on file  Intimate Partner Violence: Not At Risk (05/22/2023)   Humiliation, Afraid, Rape, and Kick questionnaire    Fear of Current or Ex-Partner: No    Emotionally Abused: No    Physically Abused: No    Sexually Abused: No    Review of Systems: All negative except as stated above in HPI.  Physical Exam: Vital signs: Vitals:   05/22/23 0057 05/22/23 0419  BP: 125/60 (!) 143/66  Pulse: 65 65  Resp: 20 20  Temp: 97.9 F (36.6 C) 98.5 F (36.9 C)  SpO2: 98% 100%   Last BM Date : 05/21/23 General:   Elderly patient, not in acute distress Lungs: No visible respiratory distress Heart:  Regular rate and rhythm; no murmurs, clicks, rubs,  or gallops. Abdomen: Soft, nontender, nondistended, bowel sound present, no peritoneal signs Rectal:  Deferred  GI:  Lab Results: Recent Labs    05/21/23 1447 05/21/23 2148 05/22/23 0411  WBC 17.4*  --  13.8*  HGB 11.5* 10.9* 10.3*  HCT 37.0 34.7* 33.3*  PLT 261  --  232   BMET Recent Labs    05/21/23 1447 05/22/23 0411  NA 136 137  K 3.8 4.1  CL 106 109  CO2 23 21*  GLUCOSE 114* 111*  BUN 53* 35*  CREATININE 1.30* 1.10*  CALCIUM 8.5* 8.0*   LFT Recent Labs    05/21/23 1447  PROT 6.4*  ALBUMIN 3.7  AST 16  ALT 22  ALKPHOS 46  BILITOT 0.9   PT/INR Recent Labs    05/21/23 1447  LABPROT 14.9  INR 1.1     Studies/Results: CT Angio Abd/Pel W and/or Wo Contrast  Result Date: 05/21/2023 CLINICAL DATA:  Lower GI bleed EXAM: CTA ABDOMEN AND PELVIS WITHOUT AND WITH CONTRAST TECHNIQUE: Multidetector CT imaging of the abdomen and pelvis was performed using the standard protocol during bolus administration of intravenous contrast. Multiplanar reconstructed  images and MIPs were obtained and reviewed to evaluate the vascular anatomy. RADIATION DOSE REDUCTION: This exam was performed according to the departmental dose-optimization program which includes automated exposure control, adjustment of the mA and/or kV according to patient size and/or use of iterative reconstruction technique.  CONTRAST:  OMNIPAQUE IOHEXOL 350 MG/ML SOLN COMPARISON:  09/18/2022 FINDINGS: VASCULAR Normal contour and caliber of the abdominal aorta. No evidence of aneurysm, dissection, or other acute aortic pathology. 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 Mild aortic atherosclerosis. 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 Review of the MIP images confirms the above findings. NON-VASCULAR Lower Chest: No acute findings. Large hiatal hernia with complete intrathoracic position of the stomach and a small portion of transverse colon, incompletely imaged. Hepatobiliary: No focal liver abnormality is seen. Status post cholecystectomy. No biliary dilatation. Pancreas: Unremarkable. No pancreatic ductal dilatation or surrounding inflammatory changes. Spleen: Normal in size without significant abnormality. Adrenals/Urinary Tract: Adrenal glands are unremarkable. Kidneys are normal, without renal calculi, solid lesion, or hydronephrosis. Small volume air in the urinary bladder, presumably secondary to recent catheterization. Stomach/Bowel: Stomach is within normal limits. Incidental descending duodenal diverticulum. Appendix appears normal. No evidence of bowel wall thickening, distention, or inflammatory changes. Pancolonic diverticulosis. Focal thickening of the distal descending colon (series 7, image 123, series 9, image 90). No intraluminal contrast extravasation. Lymphatic: No enlarged abdominal or pelvic lymph  nodes. Reproductive: Status post hysterectomy. Other: No abdominal wall hernia or abnormality. No ascites. Musculoskeletal: No acute osseous findings. IMPRESSION: 1. Pancolonic diverticulosis. Focal thickening of the distal descending colon, suggesting mild, focal diverticulitis, although underlying mass is not excluded. 2. No intraluminal contrast extravasation to specifically localize GI bleeding. 3. Large hiatal hernia with complete intrathoracic position of the stomach and a small portion of transverse colon, incompletely imaged. 4. 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 5. Small volume air in the urinary bladder, presumably secondary to recent catheterization. 6. Status post cholecystectomy and hysterectomy. Aortic Atherosclerosis (ICD10-I70.0). Electronically Signed   By: Jearld Lesch M.D.   On: 05/21/2023 16:26    Impression/Plan: -Melena in a patient with history of large hiatal hernia Cameron's erosion.  Recent CT scan showed  complete intrathoracic stomach  -History of DVT and PE.  Was on Eliquis.  Last dose yesterday morning. -Abnormal CT scan concerning for thickening of the distal descending colon.  Could be from diverticulitis or questionable underlying mass cannot be ruled out.  Last colonoscopy in July 2022 showed diverticulitis without any other findings.  Recommendations ------------------------- -Patient is requesting upper endoscopy for further evaluation.  We discussed that last several endoscopy showed similar findings but she is insisting on doing endoscopy before starting anticoagulation. -Plan for upper endoscopy tomorrow. -She will likely benefit from lifelong 40 mg twice daily PPI with her large hiatal hernia and recurrent Cameron erosions. -Continue antibiotics for possible diverticulitis.  Patient with leukocytosis, and abnormal CT scan showing descending colon diverticulitis. ?  Can consider repeat colonoscopy to  rule out underlying mass versus repeat CT scan after 4 to 6 weeks.  She had a colonoscopy 2 years ago which was normal.  Colonoscopy was done at [facility]  Risks (bleeding, infection, bowel perforation that could require surgery, sedation-related changes in cardiopulmonary systems), benefits (identification and possible treatment of source of symptoms, exclusion of certain causes of symptoms), and alternatives (watchful waiting, radiographic imaging studies, empiric medical treatment)  were explained to patient/family in detail and patient wishes to proceed.     LOS: 1 day   Kathi Der  MD, FACP 05/22/2023, 9:09 AM  Contact #  [phone]  

## 2023-05-22 NOTE — Progress Notes (Addendum)
PROGRESS NOTE Cheryl Goodman  JXB:[id number] DOB: [birth year] DOA: 05/21/2023 PCP: Danella Penton, MD  Brief Narrative/Hospital Course: 82 y.o. female with medical history significant for PE/DVT on Eliquis, history of CVA, CKD stage IIIa, HTN, HLD, history of recurrent GI bleed due to [facility] erosions, diverticulosis, iron deficiency anemia who presented w/ dark black stool for the last 5 days    Subjective: Seen and examined C/o epigastric pain On BM today Until yesterday black tarry stool for 5 days PTA   Assessment and Plan: Principal Problem:   Acute upper GI bleed Active Problems:   Diverticulitis   Essential hypertension   Hyperlipidemia   History of ischemic stroke   Chronic kidney disease, stage 3a (HCC)   History of pulmonary embolism  Melanotic stool History of large hiatal hernia/Cameron lesions: Concern for GI bleeding-FOBT positive stool: Holding Eliquis, CT abdomen pelvis showed pancolonic diverticulosis.  Continue PPI twice daily, trend [facility] GI has been consulted-planning for EGD tomorrow and question repeat colonoscopy to rule out underlying mass versus repeat CT scan in 4 to 6 weeks.  Last colonoscopy 2 years ago and normal per her Recent Labs    09/19/22 1816 09/20/22 1305 12/27/22 1703 12/28/22 0510 03/17/23 1130 04/12/23 1306 05/21/23 1447 05/21/23 2148 05/22/23 0411  HGB 8.4*   < >  --    < > 13.3 13.6 11.5* 10.9* 10.3*  MCV 91.0   < >  --    < > 91.9 90.1 90.9  --  93.3  FERRITIN  --   --  14  --   --  206  --   --   --   TIBC 441  --  459*  --   --  378  --   --   --   IRON 26*  --  42  --   --  51  --   --   --    < > = values in this interval not displayed.     Diverticulitis: CT suggestive of focal diverticulitis at the distal descending colon although underlying mass is not excluded.  She has a leukocytosis show keep on ceftriaxone Flagyl    History of PE/DVT in 09/2022 History of CVA in Nov 2023: Holding Eliquis 2/2 #1.Continue  simvastatin.   Hypertension: Hold home antihypertensive as she has had some borderline low blood pressures.   CKD stage IIIa: Creatinine slightly increased from baseline.  Continue IV fluid hydration overnight and monitor. Recent Labs    09/23/22 0546 09/24/22 0042 09/25/22 0753 09/26/22 0640 12/27/22 1052 12/28/22 0510 12/29/22 0531 03/17/23 1130 05/21/23 1447 05/22/23 0411  BUN 20 20 15 15  34* 25* 18 15 53* 35*  CREATININE 1.21* 1.36* 1.16* 1.21* 1.24* 1.10* 1.11* 1.35* 1.30* 1.10*  CO2 23 24 25 24 23 22 22 22 23  21*      Hyperlipidemia: Continue simvastatin.  Class II Obesity:Patient's Body mass index is 37.46 kg/m. : Will benefit with PCP follow-up, weight loss  healthy lifestyle and outpatient sleep evaluation.   DVT prophylaxis: SCDs Start: 05/21/23 1939 Code Status:   Code Status: Full Code Family Communication: plan of care discussed with patient at bedside. Patient status is:  inpatient  because of gi bleeding Level of care: Progressive   Dispo: The patient is from: Home w/ husband            Anticipated disposition: TBD Objective: Vitals last 24 hrs: Vitals:   05/21/23 1657 05/21/23 2011 05/22/23 0057 05/22/23 0419  BP:  114/69 124/71 125/60 (!) 143/66  Pulse: 68 69 65 65  Resp: 16 16 20 20   Temp: 98 F (36.7 C) 98.9 F (37.2 C) 97.9 F (36.6 C) 98.5 F (36.9 C)  TempSrc: Oral Oral Oral Oral  SpO2: 99% 97% 98% 100%  Weight:  81.3 kg    Height:  4\' 10"  (1.473 m)     Weight change:   Physical Examination: General exam: alert awake, older than stated age HEENT:Oral mucosa moist, Ear/Nose WNL grossly Respiratory system: bilaterally clear BS, no use of accessory muscle Cardiovascular system: S1 & S2 +, No JVD. Gastrointestinal system: Abdomen soft, mildly tender epigastrium,ND, BS+ Nervous System:Alert, awake, moving extremities. Extremities: LE edema neg,distal peripheral pulses palpable.  Skin: No rashes,no icterus. MSK: Normal muscle  bulk,tone, power  Medications reviewed:  Scheduled Meds:  pantoprazole (PROTONIX) IV  40 mg Intravenous Q12H   simvastatin  20 mg Oral QHS   sodium chloride flush  3 mL Intravenous Q12H   traMADol  50 mg Oral q AM  Continuous Infusions:  cefTRIAXone (ROCEPHIN)  IV     metronidazole 500 mg (05/22/23 3244)     Diet Order             Diet NPO time specified  Diet effective midnight                  Intake/Output Summary (Last 24 hours) at 05/22/2023 0924 Last data filed at 05/22/2023 0645 Gross per 24 hour  Intake 1083.93 ml  Output 200 ml  Net 883.93 ml   Net IO Since Admission: 883.93 mL [05/22/23 0924]  Wt Readings from Last 3 Encounters:  05/21/23 81.3 kg  04/12/23 81.4 kg  03/17/23 79.4 kg     Unresulted Labs (From admission, onward)    None     Data Reviewed: I have personally reviewed following labs and imaging studies CBC: Recent Labs  Lab 05/21/23 1447 05/21/23 2148 05/22/23 0411  WBC 17.4*  --  13.8*  NEUTROABS 13.7*  --   --   HGB 11.5* 10.9* 10.3*  HCT 37.0 34.7* 33.3*  MCV 90.9  --  93.3  PLT 261  --  232   Basic Metabolic Panel: Recent Labs  Lab 05/21/23 1447 05/22/23 0411  NA 136 137  K 3.8 4.1  CL 106 109  CO2 23 21*  GLUCOSE 114* 111*  BUN 53* 35*  CREATININE 1.30* 1.10*  CALCIUM 8.5* 8.0*  GFR: Estimated Creatinine Clearance: 35.5 mL/min (A) (by C-G formula based on SCr of 1.1 mg/dL (H)). Liver Function Tests: Recent Labs  Lab 05/21/23 1447  AST 16  ALT 22  ALKPHOS 46  BILITOT 0.9  PROT 6.4*  ALBUMIN 3.7   Recent Labs  Lab 05/21/23 1447  LIPASE 38   No results for input(s): "AMMONIA" in the last 168 hours. Coagulation Profile: Recent Labs  Lab 05/21/23 1447  INR 1.1   No results found for this or any previous visit (from the past 240 hour(s)).  Antimicrobials: Anti-infectives (From admission, onward)    Start     Dose/Rate Route Frequency Ordered Stop   05/22/23 1600  cefTRIAXone (ROCEPHIN) 2 g in sodium  chloride 0.9 % 100 mL IVPB        2 g 200 mL/hr over 30 Minutes Intravenous Every 24 hours 05/21/23 1935     05/22/23 0600  metroNIDAZOLE (FLAGYL) IVPB 500 mg        500 mg 100 mL/hr over 60 Minutes Intravenous Every 12  hours 05/21/23 1935     05/21/23 2000  cefTRIAXone (ROCEPHIN) 1 g in sodium chloride 0.9 % 100 mL IVPB        1 g 200 mL/hr over 30 Minutes Intravenous  Once 05/21/23 1949 05/21/23 2128   05/21/23 1645  cefTRIAXone (ROCEPHIN) 1 g in sodium chloride 0.9 % 100 mL IVPB        1 g 200 mL/hr over 30 Minutes Intravenous  Once 05/21/23 1641 05/21/23 1751   05/21/23 1645  metroNIDAZOLE (FLAGYL) IVPB 500 mg        500 mg 100 mL/hr over 60 Minutes Intravenous  Once 05/21/23 1641 05/21/23 1831      Culture/Microbiology None  Radiology Studies: CT Angio Abd/Pel W and/or Wo Contrast  Result Date: 05/21/2023 CLINICAL DATA:  Lower GI bleed EXAM: CTA ABDOMEN AND PELVIS WITHOUT AND WITH CONTRAST TECHNIQUE: Multidetector CT imaging of the abdomen and pelvis was performed using the standard protocol during bolus administration of intravenous contrast. Multiplanar reconstructed images and MIPs were obtained and reviewed to evaluate the vascular anatomy. RADIATION DOSE REDUCTION: This exam was performed according to the departmental dose-optimization program which includes automated exposure control, adjustment of the mA and/or kV according to patient size and/or use of iterative reconstruction technique. CONTRAST:  OMNIPAQUE IOHEXOL 350 MG/ML SOLN COMPARISON:  09/18/2022 FINDINGS: VASCULAR Normal contour and caliber of the abdominal aorta. No evidence of aneurysm, dissection, or other acute aortic pathology. 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 Mild aortic atherosclerosis. 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 Review of the MIP images confirms the above findings. NON-VASCULAR Lower Chest: No acute findings. Large hiatal hernia with complete intrathoracic position of the stomach and a small portion of transverse colon, incompletely imaged. Hepatobiliary: No focal liver abnormality is seen. Status post cholecystectomy. No biliary dilatation. Pancreas: Unremarkable. No pancreatic ductal dilatation or surrounding inflammatory changes. Spleen: Normal in size without significant abnormality. Adrenals/Urinary Tract: Adrenal glands are unremarkable. Kidneys are normal, without renal calculi, solid lesion, or hydronephrosis. Small volume air in the urinary bladder, presumably secondary to recent catheterization. Stomach/Bowel: Stomach is within normal limits. Incidental descending duodenal diverticulum. Appendix appears normal. No evidence of bowel wall thickening, distention, or inflammatory changes. Pancolonic diverticulosis. Focal thickening of the distal descending colon (series 7, image 123, series 9, image 90). No intraluminal contrast extravasation. Lymphatic: No enlarged abdominal or pelvic lymph nodes. Reproductive: Status post hysterectomy. Other: No abdominal wall hernia or abnormality. No ascites. Musculoskeletal: No acute osseous findings. IMPRESSION: 1. Pancolonic diverticulosis. Focal thickening of the distal descending colon, suggesting mild, focal diverticulitis, although underlying mass is not excluded. 2. No intraluminal contrast extravasation to specifically localize GI bleeding. 3. Large hiatal hernia with complete intrathoracic position of the stomach and a small portion of transverse colon, incompletely imaged. 4. 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 5. Small volume air in the urinary bladder, presumably secondary to recent catheterization. 6. Status post cholecystectomy and hysterectomy. Aortic Atherosclerosis (ICD10-I70.0).  Electronically Signed   By: Jearld Lesch M.D.   On: 05/21/2023 16:26     LOS: 1 day   Lanae Boast, MD [facility]  05/22/2023, 9:24 AM

## 2023-05-22 NOTE — H&P (View-Only) (Signed)
Referring Provider: TH Primary Care Physician:  Danella Penton, MD Primary Gastroenterologist:  Dr. Dulce Sellar  Reason for Consultation: Melena  HPI: Cheryl Goodman is a 82 y.o. female history of PE and DVT on Eliquis, history of CVA, chronic kidney disease, history of GI bleed in the past which was thought to be from hemorrhoids erosions admitted to the hospital with black-colored stool and anemia.  Last dose of Eliquis on the morning of 8/17.  Her hemoglobin in March 2024 was 10.9.  Hemoglobin was normal in June and July of this year.  Hemoglobin on admission dropped to 11.5 yesterday and hemoglobin is 10.3 this morning.  Occult blood positive.  She was also found to have leukocytosis with white count of 17.4 on admission.  CT abdomen pelvis with IV contrast yesterday showed focal thickening of the distal descending colon could be mild diverticulitis although underlying mass cannot be excluded.  Also showed large hiatal hernia with complete intrathoracic stomach as well as small portion of transverse colon.  Patient had upper and right-sided abdominal pain radiating to the back which has resolved now.  Denies any vomiting.  Continues to have acid reflux.  Currently taking pantoprazole 40 mg daily.  She was admitted to the hospital with similar symptoms in March 2024.  EGD at that time showed 10 cm hiatal hernia with nonbleeding Cameron's erosions.  Also showed gastritis and gastric polyps.  She was advised to stay on Protonix 40 mg twice a day indefinitely because of recurrent bleeding. Also had EGD in December 2023 as well as  July 2022. Colonoscopy in July 2022 showed diverticulosis.  Colonoscopy done at [facility]  Past Medical History:  Diagnosis Date   Acute pulmonary embolism with acute cor pulmonale (HCC) 08/10/2019   Anemia    Arthritis    Dysphagia    GERD (gastroesophageal reflux disease)    History of hiatal hernia    History of kidney stones    x2 ; passed independently    Hyperlipidemia    Hypertension    Nausea    OA (osteoarthritis) of knee 10/31/2017   Sleep apnea    Stroke [facility])     Past Surgical History:  Procedure Laterality Date   ABDOMINAL HYSTERECTOMY     APPENDECTOMY     BIOPSY  12/28/2022   Procedure: BIOPSY;  Surgeon: Lynann Bologna, DO;  Location: [facility] ENDOSCOPY;  Service: Gastroenterology;;   BREAST BIOPSY Right    BREAST SURGERY     CARPAL TUNNEL RELEASE     CHOLECYSTECTOMY     COLONOSCOPY WITH PROPOFOL N/A 09/01/2015   Procedure: COLONOSCOPY WITH PROPOFOL;  Surgeon: Scot Jun, MD;  Location: [facility] ENDOSCOPY;  Service: Endoscopy;  Laterality: N/A;   COLONOSCOPY WITH PROPOFOL N/A 04/29/2021   Procedure: COLONOSCOPY WITH PROPOFOL;  Surgeon: Earline Mayotte, MD;  Location: [facility] ENDOSCOPY;  Service: Gastroenterology;  Laterality: N/A;   ESOPHAGOGASTRODUODENOSCOPY N/A 07/17/2019   Procedure: ESOPHAGOGASTRODUODENOSCOPY (EGD);  Surgeon: Pasty Spillers, MD;  Location: [facility] ENDOSCOPY;  Service: Endoscopy;  Laterality: N/A;   ESOPHAGOGASTRODUODENOSCOPY N/A 12/28/2022   Procedure: ESOPHAGOGASTRODUODENOSCOPY (EGD);  Surgeon: Lynann Bologna, DO;  Location: [facility] ENDOSCOPY;  Service: Gastroenterology;  Laterality: N/A;   ESOPHAGOGASTRODUODENOSCOPY (EGD) WITH PROPOFOL N/A 04/29/2021   Procedure: ESOPHAGOGASTRODUODENOSCOPY (EGD) WITH PROPOFOL;  Surgeon: Earline Mayotte, MD;  Location: [facility] ENDOSCOPY;  Service: Gastroenterology;  Laterality: N/A;   ESOPHAGOGASTRODUODENOSCOPY (EGD) WITH PROPOFOL N/A 09/21/2022   Procedure: ESOPHAGOGASTRODUODENOSCOPY (EGD) WITH PROPOFOL;  Surgeon: Willis Modena, MD;  Location: [facility]  ENDOSCOPY;  Service: Gastroenterology;  Laterality: N/A;   IR KYPHO LUMBAR INC FX REDUCE BONE BX UNI/BIL CANNULATION INC/IMAGING  10/19/2022   IR RADIOLOGIST EVAL & MGMT  11/04/2022   REDUCTION MAMMAPLASTY Bilateral    SAVORY DILATION N/A 09/01/2015   Procedure: SAVORY DILATION;  Surgeon: Scot Jun, MD;  Location: [facility]  ENDOSCOPY;  Service: Endoscopy;  Laterality: N/A;   TOTAL KNEE ARTHROPLASTY Right 10/31/2017   Procedure: RIGHT TOTAL KNEE ARTHROPLASTY;  Surgeon: Ollen Gross, MD;  Location: [facility] ORS;  Service: Orthopedics;  Laterality: Right;    Prior to Admission medications   Medication Sig Start Date End Date Taking? Authorizing Provider  acetaminophen (TYLENOL) 325 MG tablet Take 325-650 mg by mouth every 6 (six) hours as needed for headache or mild pain.   Yes [provider]  acetaminophen-codeine (TYLENOL #3) 300-30 MG tablet Take 1 tablet by mouth every 4 (four) hours as needed for moderate pain.   Yes [provider]  apixaban (ELIQUIS) 2.5 MG TABS tablet Take 2.5 mg by mouth 2 (two) times daily.   Yes [provider]  Cholecalciferol (VITAMIN D3) 125 MCG (5000 UT) TABS Take 5,000 Units by mouth daily.   Yes [provider]  HYDROcodone-acetaminophen (NORCO/VICODIN) 5-325 MG tablet Take 1 tablet by mouth every 6 (six) hours as needed. Patient taking differently: Take 1 tablet by mouth every 6 (six) hours as needed for moderate pain. 03/17/23  Yes Aberman, Merla Riches, PA-C  pantoprazole (PROTONIX) 40 MG tablet Take 1 tablet (40 mg total) by mouth 2 (two) times daily before a meal. 12/29/22 05/21/23 Yes Khatri, Alverda Skeans, MD  PROLIA 60 MG/ML SOSY injection Inject 60 mg into the skin every 6 (six) months.   Yes [provider]  simvastatin (ZOCOR) 20 MG tablet Take 20 mg by mouth at bedtime.   Yes [provider]  sucralfate (CARAFATE) 1 g tablet Take 1 g by mouth in the morning and at bedtime.   Yes [provider]  traMADol (ULTRAM) 50 MG tablet Take 50 mg by mouth in the morning.   Yes [provider]  triamterene-hydrochlorothiazide (DYAZIDE) 37.5-25 MG capsule Take 1 capsule by mouth daily.   Yes [provider]  polyethylene glycol (MIRALAX / GLYCOLAX) 17 g packet Take 17 g by mouth daily. Patient not taking: Reported on  01/11/2023 08/07/19   Rodolph Bong, MD    Scheduled Meds:  pantoprazole (PROTONIX) IV  40 mg Intravenous Q12H   simvastatin  20 mg Oral QHS   sodium chloride flush  3 mL Intravenous Q12H   traMADol  50 mg Oral q AM   Continuous Infusions:  cefTRIAXone (ROCEPHIN)  IV     metronidazole 500 mg (05/22/23 0642)   PRN Meds:.acetaminophen **OR** acetaminophen, ondansetron **OR** ondansetron (ZOFRAN) IV, mouth rinse  Allergies as of 05/21/2023 - Review Complete 05/21/2023  Allergen Reaction Noted   Sulfa antibiotics Anaphylaxis and Swelling 08/14/2015   Dexlansoprazole Other (See Comments) 08/14/2015   Ace inhibitors Cough 11/05/2015   Oxycodone Nausea Only 12/19/2017   Penicillins Rash 08/14/2015    Family History  Problem Relation Age of Onset   Breast cancer Maternal Grandmother        in 65's   Heart disease Neg Hx     Social History   Socioeconomic History   Marital status: Married    Spouse name: Not on file   Number of children: Not on file   Years of education: Not on file   Highest  education level: Not on file  Occupational History   Not on file  Tobacco Use   Smoking status: Never   Smokeless tobacco: Never  Vaping Use   Vaping status: Never Used  Substance and Sexual Activity   Alcohol use: Not on file   Drug use: Not on file   Sexual activity: Not Currently    Birth control/protection: None    Comment: intercourse age 46, less than 5 sexual partners  Other Topics Concern   Not on file  Social History Narrative   Not on file   Social Determinants of Health   Financial Resource Strain: Low Risk  (03/23/2023)   [facility], [facility]   Overall Financial Resource Strain (CARDIA)    Difficulty of Paying Living Expenses: Not hard at all  Food Insecurity: No Food Insecurity (05/21/2023)   Hunger Vital Sign    Worried About Running Out of Food in the Last Year: Never true    Ran Out of Food in the Last  Year: Never true  Transportation Needs: No Transportation Needs (05/22/2023)   PRAPARE - Administrator, Civil Service (Medical): No    Lack of Transportation (Non-Medical): No  Physical Activity: Not on file  Stress: Not on file  Social Connections: Not on file  Intimate Partner Violence: Not At Risk (05/22/2023)   Humiliation, Afraid, Rape, and Kick questionnaire    Fear of Current or Ex-Partner: No    Emotionally Abused: No    Physically Abused: No    Sexually Abused: No    Review of Systems: All negative except as stated above in HPI.  Physical Exam: Vital signs: Vitals:   05/22/23 0057 05/22/23 0419  BP: 125/60 (!) 143/66  Pulse: 65 65  Resp: 20 20  Temp: 97.9 F (36.6 C) 98.5 F (36.9 C)  SpO2: 98% 100%   Last BM Date : 05/21/23 General:   Elderly patient, not in acute distress Lungs: No visible respiratory distress Heart:  Regular rate and rhythm; no murmurs, clicks, rubs,  or gallops. Abdomen: Soft, nontender, nondistended, bowel sound present, no peritoneal signs Rectal:  Deferred  GI:  Lab Results: Recent Labs    05/21/23 1447 05/21/23 2148 05/22/23 0411  WBC 17.4*  --  13.8*  HGB 11.5* 10.9* 10.3*  HCT 37.0 34.7* 33.3*  PLT 261  --  232   BMET Recent Labs    05/21/23 1447 05/22/23 0411  NA 136 137  K 3.8 4.1  CL 106 109  CO2 23 21*  GLUCOSE 114* 111*  BUN 53* 35*  CREATININE 1.30* 1.10*  CALCIUM 8.5* 8.0*   LFT Recent Labs    05/21/23 1447  PROT 6.4*  ALBUMIN 3.7  AST 16  ALT 22  ALKPHOS 46  BILITOT 0.9   PT/INR Recent Labs    05/21/23 1447  LABPROT 14.9  INR 1.1     Studies/Results: CT Angio Abd/Pel W and/or Wo Contrast  Result Date: 05/21/2023 CLINICAL DATA:  Lower GI bleed EXAM: CTA ABDOMEN AND PELVIS WITHOUT AND WITH CONTRAST TECHNIQUE: Multidetector CT imaging of the abdomen and pelvis was performed using the standard protocol during bolus administration of intravenous contrast. Multiplanar reconstructed  images and MIPs were obtained and reviewed to evaluate the vascular anatomy. RADIATION DOSE REDUCTION: This exam was performed according to the departmental dose-optimization program which includes automated exposure control, adjustment of the mA and/or kV according to patient size and/or use of iterative reconstruction technique.  CONTRAST:  OMNIPAQUE IOHEXOL 350 MG/ML SOLN COMPARISON:  09/18/2022 FINDINGS: VASCULAR Normal contour and caliber of the abdominal aorta. No evidence of aneurysm, dissection, or other acute aortic pathology. 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 Mild aortic atherosclerosis. 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 Review of the MIP images confirms the above findings. NON-VASCULAR Lower Chest: No acute findings. Large hiatal hernia with complete intrathoracic position of the stomach and a small portion of transverse colon, incompletely imaged. Hepatobiliary: No focal liver abnormality is seen. Status post cholecystectomy. No biliary dilatation. Pancreas: Unremarkable. No pancreatic ductal dilatation or surrounding inflammatory changes. Spleen: Normal in size without significant abnormality. Adrenals/Urinary Tract: Adrenal glands are unremarkable. Kidneys are normal, without renal calculi, solid lesion, or hydronephrosis. Small volume air in the urinary bladder, presumably secondary to recent catheterization. Stomach/Bowel: Stomach is within normal limits. Incidental descending duodenal diverticulum. Appendix appears normal. No evidence of bowel wall thickening, distention, or inflammatory changes. Pancolonic diverticulosis. Focal thickening of the distal descending colon (series 7, image 123, series 9, image 90). No intraluminal contrast extravasation. Lymphatic: No enlarged abdominal or pelvic lymph  nodes. Reproductive: Status post hysterectomy. Other: No abdominal wall hernia or abnormality. No ascites. Musculoskeletal: No acute osseous findings. IMPRESSION: 1. Pancolonic diverticulosis. Focal thickening of the distal descending colon, suggesting mild, focal diverticulitis, although underlying mass is not excluded. 2. No intraluminal contrast extravasation to specifically localize GI bleeding. 3. Large hiatal hernia with complete intrathoracic position of the stomach and a small portion of transverse colon, incompletely imaged. 4. 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 5. Small volume air in the urinary bladder, presumably secondary to recent catheterization. 6. Status post cholecystectomy and hysterectomy. Aortic Atherosclerosis (ICD10-I70.0). Electronically Signed   By: Jearld Lesch M.D.   On: 05/21/2023 16:26    Impression/Plan: -Melena in a patient with history of large hiatal hernia Cameron's erosion.  Recent CT scan showed  complete intrathoracic stomach  -History of DVT and PE.  Was on Eliquis.  Last dose yesterday morning. -Abnormal CT scan concerning for thickening of the distal descending colon.  Could be from diverticulitis or questionable underlying mass cannot be ruled out.  Last colonoscopy in July 2022 showed diverticulitis without any other findings.  Recommendations ------------------------- -Patient is requesting upper endoscopy for further evaluation.  We discussed that last several endoscopy showed similar findings but she is insisting on doing endoscopy before starting anticoagulation. -Plan for upper endoscopy tomorrow. -She will likely benefit from lifelong 40 mg twice daily PPI with her large hiatal hernia and recurrent Cameron erosions. -Continue antibiotics for possible diverticulitis.  Patient with leukocytosis, and abnormal CT scan showing descending colon diverticulitis. ?  Can consider repeat colonoscopy to  rule out underlying mass versus repeat CT scan after 4 to 6 weeks.  She had a colonoscopy 2 years ago which was normal.  Colonoscopy was done at [facility]  Risks (bleeding, infection, bowel perforation that could require surgery, sedation-related changes in cardiopulmonary systems), benefits (identification and possible treatment of source of symptoms, exclusion of certain causes of symptoms), and alternatives (watchful waiting, radiographic imaging studies, empiric medical treatment)  were explained to patient/family in detail and patient wishes to proceed.     LOS: 1 day   Kathi Der  MD, FACP 05/22/2023, 9:09 AM  Contact #  [phone]  

## 2023-05-22 NOTE — Plan of Care (Signed)
  Problem: Safety: Goal: Ability to remain free from injury will improve Outcome: Progressing   Problem: Pain Managment: Goal: General experience of comfort will improve Outcome: Progressing   Problem: Coping: Goal: Level of anxiety will decrease Outcome: Progressing   Problem: Elimination: Goal: Will not experience complications related to bowel motility Outcome: Progressing

## 2023-05-23 ENCOUNTER — Inpatient Hospital Stay (HOSPITAL_COMMUNITY): Payer: Medicare HMO | Admitting: Anesthesiology

## 2023-05-23 ENCOUNTER — Encounter (HOSPITAL_COMMUNITY): Payer: Self-pay | Admitting: Internal Medicine

## 2023-05-23 ENCOUNTER — Encounter (HOSPITAL_COMMUNITY)
Admission: EM | Disposition: A | Discharge status: Home / Self Care | Payer: Self-pay | Source: Home / Self Care | Attending: Internal Medicine

## 2023-05-23 DIAGNOSIS — N1831 Chronic kidney disease, stage 3a: Secondary | ICD-10-CM

## 2023-05-23 DIAGNOSIS — I129 Hypertensive chronic kidney disease with stage 1 through stage 4 chronic kidney disease, or unspecified chronic kidney disease: Secondary | ICD-10-CM

## 2023-05-23 DIAGNOSIS — K922 Gastrointestinal hemorrhage, unspecified: Secondary | ICD-10-CM | POA: Diagnosis not present

## 2023-05-23 DIAGNOSIS — K2970 Gastritis, unspecified, without bleeding: Secondary | ICD-10-CM

## 2023-05-23 DIAGNOSIS — K449 Diaphragmatic hernia without obstruction or gangrene: Secondary | ICD-10-CM

## 2023-05-23 HISTORY — PX: ESOPHAGOGASTRODUODENOSCOPY (EGD) WITH PROPOFOL: SHX5813

## 2023-05-23 LAB — CBC
HCT: 31.7 % — ABNORMAL LOW (ref 36.0–46.0)
Hemoglobin: 9.9 g/dL — ABNORMAL LOW (ref 12.0–15.0)
MCH: 28.9 pg (ref 26.0–34.0)
MCHC: 31.2 g/dL (ref 30.0–36.0)
MCV: 92.7 fL (ref 80.0–100.0)
Platelets: 240 10*3/uL (ref 150–400)
RBC: 3.42 MIL/uL — ABNORMAL LOW (ref 3.87–5.11)
RDW: 14.6 % (ref 11.5–15.5)
WBC: 11.7 10*3/uL — ABNORMAL HIGH (ref 4.0–10.5)
nRBC: 0 % (ref 0.0–0.2)

## 2023-05-23 LAB — BASIC METABOLIC PANEL
Anion gap: 5 (ref 5–15)
BUN: 28 mg/dL — ABNORMAL HIGH (ref 8–23)
CO2: 24 mmol/L (ref 22–32)
Calcium: 8.3 mg/dL — ABNORMAL LOW (ref 8.9–10.3)
Chloride: 111 mmol/L (ref 98–111)
Creatinine, Ser: 1.11 mg/dL — ABNORMAL HIGH (ref 0.44–1.00)
GFR, Estimated: 50 mL/min — ABNORMAL LOW (ref >60)
Glucose, Bld: 116 mg/dL — ABNORMAL HIGH (ref 70–99)
Potassium: 4.1 mmol/L (ref 3.5–5.1)
Sodium: 140 mmol/L (ref 135–145)

## 2023-05-23 SURGERY — ESOPHAGOGASTRODUODENOSCOPY (EGD) WITH PROPOFOL
Anesthesia: Monitor Anesthesia Care

## 2023-05-23 MED ORDER — PROPOFOL 500 MG/50ML IV EMUL
INTRAVENOUS | Status: DC | PRN
  Administered 2023-05-23: 100 ug/kg/min via INTRAVENOUS

## 2023-05-23 MED ORDER — PANTOPRAZOLE SODIUM 40 MG PO TBEC: 40.0000 mg | DELAYED_RELEASE_TABLET | Freq: Every day | ORAL | Status: DC

## 2023-05-23 MED ORDER — PROPOFOL 10 MG/ML IV BOLUS
INTRAVENOUS | Status: DC | PRN
  Administered 2023-05-23: 60 mg via INTRAVENOUS

## 2023-05-23 MED ORDER — LIDOCAINE HCL (PF) 2 % IJ SOLN
INTRAMUSCULAR | Status: DC | PRN
  Administered 2023-05-23: 80 mg via INTRADERMAL

## 2023-05-23 MED ORDER — CIPROFLOXACIN HCL 500 MG PO TABS: 500.0000 mg | ORAL_TABLET | Freq: Two times a day (BID) | ORAL | 0 refills | Status: AC

## 2023-05-23 MED ORDER — METRONIDAZOLE 500 MG PO TABS: 500.0000 mg | ORAL_TABLET | Freq: Three times a day (TID) | ORAL | 0 refills | Status: AC

## 2023-05-23 MED ORDER — LACTATED RINGERS IV SOLN
INTRAVENOUS | Status: AC | PRN
  Administered 2023-05-23: 1000 mL via INTRAVENOUS

## 2023-05-23 MED ORDER — PANTOPRAZOLE SODIUM 40 MG PO TBEC: 40.0000 mg | DELAYED_RELEASE_TABLET | Freq: Every day | ORAL | 1 refills | Status: DC

## 2023-05-23 SURGICAL SUPPLY — 15 items

## 2023-05-23 NOTE — Progress Notes (Signed)
Patient ate a bowl of broccoli/cheddar soup from [employer] with no difficulties. OK for D/C per MD Ghimire. Patient and husband agrees. D/C Instructions explained- pt verbalizes understanding.

## 2023-05-23 NOTE — Anesthesia Preprocedure Evaluation (Addendum)
Anesthesia Evaluation  Patient identified by MRN, date of birth, ID band Patient awake    Reviewed: Allergy & Precautions, NPO status , Patient's Chart, lab work & pertinent test results  Airway Mallampati: III  TM Distance: >3 FB Neck ROM: Limited    Dental no notable dental hx.    Pulmonary sleep apnea , PE   Pulmonary exam normal        Cardiovascular hypertension,  Rhythm:Regular Rate:Normal  Echo 09/2022  1. Left ventricular ejection fraction, by estimation, is 60 to 65%. The left ventricle has normal function. The left ventricle has no regional wall motion abnormalities. Left ventricular diastolic parameters are consistent with Grade I diastolic dysfunction (impaired relaxation).   2. Right ventricular systolic function is low normal. The right ventricular size is normal.   3. The mitral valve is grossly normal. No evidence of mitral valve regurgitation. No evidence of mitral stenosis.   4. The aortic valve is grossly normal. Aortic valve regurgitation is not visualized. No aortic stenosis is present.   5. Cannot exclude a small PFO by color doppler, however, agitated saline contrast bubble study was negative for right to left shunt.   Comparison(s): Changes from prior study are noted. 08/02/2019: LVEF 60-65%, severe RV dysfunction (Right heart strain).     Neuro/Psych CVA  negative psych ROS   GI/Hepatic Neg liver ROS, hiatal hernia,GERD  Medicated,,  Endo/Other  negative endocrine ROS    Renal/GU Renal disease  negative genitourinary   Musculoskeletal  (+) Arthritis , Osteoarthritis,    Abdominal Normal abdominal exam  (+)   Peds  Hematology  (+) Blood dyscrasia, anemia Lab Results      Component                Value               Date                      WBC                      11.0 (H)            12/28/2022                HGB                      10.0 (L)            12/28/2022                HCT                       32.3 (L)            12/28/2022                MCV                      89.5                12/28/2022                PLT                      215                 12/28/2022  Anesthesia Other Findings   Reproductive/Obstetrics                             Anesthesia Physical Anesthesia Plan  ASA: 3  Anesthesia Plan: MAC   Post-op Pain Management: Minimal or no pain anticipated   Induction: Intravenous  PONV Risk Score and Plan: 2 and Propofol infusion, Treatment may vary due to age or medical condition and TIVA  Airway Management Planned: Simple Face Mask and Nasal Cannula  Additional Equipment: None  Intra-op Plan:   Post-operative Plan:   Informed Consent: I have reviewed the patients History and Physical, chart, labs and discussed the procedure including the risks, benefits and alternatives for the proposed anesthesia with the patient or authorized representative who has indicated his/her understanding and acceptance.     Dental advisory given  Plan Discussed with: CRNA  Anesthesia Plan Comments:        Anesthesia Quick Evaluation

## 2023-05-23 NOTE — Transfer of Care (Signed)
Immediate Anesthesia Transfer of Care Note  Patient: Cheryl Goodman  Procedure(s) Performed: ESOPHAGOGASTRODUODENOSCOPY (EGD) WITH PROPOFOL  Patient Location: PACU and Endoscopy Unit  Anesthesia Type:MAC  Level of Consciousness: awake, alert , oriented, and patient cooperative  Airway & Oxygen Therapy: Patient Spontanous Breathing and Patient connected to face mask oxygen  Post-op Assessment: Report given to RN and Post -op Vital signs reviewed and stable  Post vital signs: Reviewed and stable  Last Vitals:  Vitals Value Taken Time  BP    Temp    Pulse 66 05/23/23 1401  Resp 21 05/23/23 1401  SpO2 100 % 05/23/23 1401  Vitals shown include unfiled device data.  Last Pain:  Vitals:   05/23/23 1258  TempSrc: Temporal  PainSc: 0-No pain         Complications: No notable events documented.

## 2023-05-23 NOTE — Plan of Care (Signed)
  Problem: Education: °Goal: Knowledge of General Education information will improve °Description: Including pain rating scale, medication(s)/side effects and non-pharmacologic comfort measures °Outcome: Progressing °  °Problem: Clinical Measurements: °Goal: Diagnostic test results will improve °Outcome: Progressing °  °Problem: Activity: °Goal: Risk for activity intolerance will decrease °Outcome: Progressing °  °Problem: Coping: °Goal: Level of anxiety will decrease °Outcome: Progressing °  °

## 2023-05-23 NOTE — Op Note (Signed)
[facility] Patient Name: Cheryl Goodman Procedure Date: 05/23/2023 MRN: [id number] Attending MD: Willis Modena , MD, [id number] Date of Birth: [birth year] CSN: [id number] Age: 82 Admit Type: Inpatient Procedure:                Upper GI endoscopy Indications:              Acute post hemorrhagic anemia, Melena Providers:                Willis Modena, MD, Fransisca Connors, Adin Hector,                            RN, Sunday Corn Mbumina, Technician Referring MD:             [facility] Medicines:                Monitored Anesthesia Care Complications:            No immediate complications. Estimated Blood Loss:     Estimated blood loss: none. Procedure:                Pre-Anesthesia Assessment:                           - Prior to the procedure, a History and Physical                            was performed, and patient medications and                            allergies were reviewed. The patient's tolerance of                            previous anesthesia was also reviewed. The risks                            and benefits of the procedure and the sedation                            options and risks were discussed with the patient.                            All questions were answered, and informed consent                            was obtained. Prior Anticoagulants: The patient has                            taken Eliquis (apixaban), last dose was 2 days                            prior to procedure. ASA Grade Assessment: III - A                            patient with severe systemic disease. After  reviewing the risks and benefits, the patient was                            deemed in satisfactory condition to undergo the                            procedure.                           After obtaining informed consent, the endoscope was                            passed under direct vision. Throughout the                             procedure, the patient's blood pressure, pulse, and                            oxygen saturations were monitored continuously. The                            [id number]-[id number] ([id number]) Olympus endoscope was introduced                            through the mouth, and advanced to the second part                            of duodenum. The upper GI endoscopy was                            accomplished without difficulty. The patient                            tolerated the procedure well. Scope In: Scope Out: Findings:      A 10 cm hiatal hernia was present.      The exam of the esophagus was otherwise normal.      Patchy mild inflammation was found in the entire examined stomach.      A few small sessile polyps were found in the gastric body and in the       gastric antrum.      The exam of the stomach was otherwise normal.      The duodenal bulb, first portion of the duodenum and second portion of       the duodenum were normal.      No old or fresh blood was seen to the extent of our examination. Impression:               - 10 cm hiatal hernia.                           - Gastritis.                           - A few gastric polyps.                           -  Normal duodenal bulb, first portion of the                            duodenum and second portion of the duodenum.                           - No specimens collected. Moderate Sedation:      Not Applicable - Patient had care per Anesthesia. Recommendation:           - Return patient to hospital ward for ongoing care.                           - Soft diet today.                           - Continue present medications.                           - No contraindication to anticoagulation from GI                            perspective. Would make sure if anticoagulation                            started she is on daily PPI (pantoprazole 40 mg po                            qd, or the equivalent).                           [facility] will  sign-off; we can arrange outpatient                            follow-up with [place]; please call with any questions;                            thank you for the consultation. Procedure Code(s):        --- Professional ---                           [phone], Esophagogastroduodenoscopy, flexible,                            transoral; diagnostic, including collection of                            specimen(s) by brushing or washing, when performed                            (separate procedure) Diagnosis Code(s):        --- Professional ---                           K44.9, Diaphragmatic hernia without obstruction or  gangrene                           K29.70, Gastritis, unspecified, without bleeding                           K31.7, Polyp of stomach and duodenum                           D62, Acute posthemorrhagic anemia                           K92.1, Melena (includes Hematochezia) CPT copyright 2022 American [employer]. All rights reserved. The codes documented in this report are preliminary and upon coder review may  be revised to meet current compliance requirements. Willis Modena, MD 05/23/2023 2:01:29 PM This report has been signed electronically. Number of Addenda: 0

## 2023-05-23 NOTE — TOC CM/SW Note (Signed)
Transition of Care [facility]) - Inpatient Brief Assessment   Patient Details  Name: Cheryl Goodman MRN: [id number] Date of Birth: Aug 21, 1941  [facility]) CM/SW Contact:    Howell Rucks, RN Phone Number: 05/23/2023, 10:11 AM   Clinical Narrative: Met with pt and pt's spouse at bedside to introduce role of TOC/NCM and review for dc planning. Pt reports she has a PCP and pharmacy in place, no current home care services, home DME: shower chair, potty chair, walker, pt confirms she feels safe returning home and has good family support, family will provide transportation at discharge.  TOC Brief Assessment completed. No TOC needs identified.     Transition of Care Asessment: Insurance and Status: Insurance coverage has been reviewed Patient has primary care physician: Yes Home environment has been reviewed: resides in private residence with spouse Prior level of function:: Independent Prior/Current Home Services: No current home services Social Determinants of Health Reivew: SDOH reviewed no interventions necessary Readmission risk has been reviewed: Yes Transition of care needs: no transition of care needs at this time

## 2023-05-23 NOTE — Interval H&P Note (Signed)
History and Physical Interval Note:  05/23/2023 1:35 PM  Cheryl Goodman  has presented today for surgery, with the diagnosis of GI bleed.  The various methods of treatment have been discussed with the patient and family. After consideration of risks, benefits and other options for treatment, the patient has consented to  Procedure(s): ESOPHAGOGASTRODUODENOSCOPY (EGD) WITH PROPOFOL (N/A) as a surgical intervention.  The patient's history has been reviewed, patient examined, no change in status, stable for surgery.  I have reviewed the patient's chart and labs.  Questions were answered to the patient's satisfaction.     Freddy Jaksch

## 2023-05-23 NOTE — Discharge Summary (Signed)
Physician Discharge Summary  Cheryl Goodman WGN:[id number] DOB: Sep 29, 1941 DOA: 05/21/2023  PCP: Danella Penton, MD  Admit date: 05/21/2023 Discharge date: 05/23/2023  Admitted From: Home Disposition: Home  Recommendations for Outpatient Follow-up:  Follow up with PCP in 1-2 weeks Please obtain BMP/CBC in one week GI to schedule outpatient follow-up  Home Health: None Equipment/Devices: None  Discharge Condition: Stable CODE STATUS: Full code Diet recommendation: Soft diet, low-salt diet  Discharge summary: 82 year old with history of PE and DVT on Eliquis, history of stroke, CKD stage IIIa, hypertension, hyperlipidemia and history of Cameron erosions, diverticulosis, iron-deficiency anemia presented to the emergency room with dark black stool for last 5 days.  She was found to have melanotic stool, history of Cameron ulcers and FOBT positive stool.  Started on PPI, hemoglobin remained stable. Underwent EGD 8/19 with no acute bleeding or active findings. Colonoscopy 2 years ago with diverticulosis. CT scan with diverticulosis, inflammation at the distal descending colon.  WBC count was elevated.  Patient did good clinical recovery.  Currently with normal bowel functions.  EGD without any significant findings.  Hemoglobin without any significant drop. Patient will be going home with 10 days of oral antibiotics including ciprofloxacin and Flagyl.  She will either have a follow-up colonoscopy or repeat CT scan and this will be scheduled by GI office.  Since there is no active bleeding, will continue Eliquis.  Discharge Diagnoses:  Principal Problem:   Acute upper GI bleed Active Problems:   Diverticulitis   Essential hypertension   Hyperlipidemia   History of ischemic stroke   Chronic kidney disease, stage 3a (HCC)   History of pulmonary embolism    Discharge Instructions  Discharge Instructions     Diet - low sodium heart healthy   Complete by: As directed    Increase  activity slowly   Complete by: As directed       Allergies as of 05/23/2023       Reactions   Sulfa Antibiotics Anaphylaxis, Swelling   Dexlansoprazole Other (See Comments)   Syncope   Ace Inhibitors Cough   Oxycodone Nausea Only   Penicillins Rash   Has patient had a PCN reaction causing immediate rash, facial/tongue/throat swelling, SOB or lightheadedness with hypotension: No Has patient had a PCN reaction causing severe rash involving mucus membranes or skin necrosis: No Has patient had a PCN reaction that required hospitalization: No Has patient had a PCN reaction occurring within the last 10 years: No If all of the above answers are "NO", then may proceed with Cephalosporin use.        Medication List     TAKE these medications    acetaminophen 325 MG tablet Commonly known as: TYLENOL Take 325-650 mg by mouth every 6 (six) hours as needed for headache or mild pain.   acetaminophen-codeine 300-30 MG tablet Commonly known as: TYLENOL #3 Take 1 tablet by mouth every 4 (four) hours as needed for moderate pain.   ciprofloxacin 500 MG tablet Commonly known as: Cipro Take 1 tablet (500 mg total) by mouth 2 (two) times daily for 10 days.   Eliquis 2.5 MG Tabs tablet Generic drug: apixaban Take 2.5 mg by mouth 2 (two) times daily.   HYDROcodone-acetaminophen 5-325 MG tablet Commonly known as: NORCO/VICODIN Take 1 tablet by mouth every 6 (six) hours as needed. What changed: reasons to take this   metroNIDAZOLE 500 MG tablet Commonly known as: Flagyl Take 1 tablet (500 mg total) by mouth 3 (three) times daily for  10 days.   pantoprazole 40 MG tablet Commonly known as: PROTONIX Take 1 tablet (40 mg total) by mouth daily. What changed: when to take this   polyethylene glycol 17 g packet Commonly known as: MIRALAX / GLYCOLAX Take 17 g by mouth daily.   Prolia 60 MG/ML Sosy injection Generic drug: denosumab Inject 60 mg into the skin every 6 (six) months.    simvastatin 20 MG tablet Commonly known as: ZOCOR Take 20 mg by mouth at bedtime.   sucralfate 1 g tablet Commonly known as: CARAFATE Take 1 g by mouth in the morning and at bedtime.   traMADol 50 MG tablet Commonly known as: ULTRAM Take 50 mg by mouth in the morning.   triamterene-hydrochlorothiazide 37.5-25 MG capsule Commonly known as: DYAZIDE Take 1 capsule by mouth daily.   Vitamin D3 125 MCG (5000 UT) Tabs Take 5,000 Units by mouth daily.        Allergies  Allergen Reactions   Sulfa Antibiotics Anaphylaxis and Swelling   Dexlansoprazole Other (See Comments)    Syncope    Ace Inhibitors Cough   Oxycodone Nausea Only   Penicillins Rash    Has patient had a PCN reaction causing immediate rash, facial/tongue/throat swelling, SOB or lightheadedness with hypotension: No Has patient had a PCN reaction causing severe rash involving mucus membranes or skin necrosis: No Has patient had a PCN reaction that required hospitalization: No Has patient had a PCN reaction occurring within the last 10 years: No If all of the above answers are "NO", then may proceed with Cephalosporin use.     Consultations: Gastroenterology   Procedures/Studies: CT Angio Abd/Pel W and/or Wo Contrast  Result Date: 05/21/2023 CLINICAL DATA:  Lower GI bleed EXAM: CTA ABDOMEN AND PELVIS WITHOUT AND WITH CONTRAST TECHNIQUE: Multidetector CT imaging of the abdomen and pelvis was performed using the standard protocol during bolus administration of intravenous contrast. Multiplanar reconstructed images and MIPs were obtained and reviewed to evaluate the vascular anatomy. RADIATION DOSE REDUCTION: This exam was performed according to the departmental dose-optimization program which includes automated exposure control, adjustment of the mA and/or kV according to patient size and/or use of iterative reconstruction technique. CONTRAST:  OMNIPAQUE IOHEXOL 350 MG/ML SOLN COMPARISON:  09/18/2022 FINDINGS:  VASCULAR Normal contour and caliber of the abdominal aorta. No evidence of aneurysm, dissection, or other acute aortic pathology. 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 Mild aortic atherosclerosis. 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 Review of the MIP images confirms the above findings. NON-VASCULAR Lower Chest: No acute findings. Large hiatal hernia with complete intrathoracic position of the stomach and a small portion of transverse colon, incompletely imaged. Hepatobiliary: No focal liver abnormality is seen. Status post cholecystectomy. No biliary dilatation. Pancreas: Unremarkable. No pancreatic ductal dilatation or surrounding inflammatory changes. Spleen: Normal in size without significant abnormality. Adrenals/Urinary Tract: Adrenal glands are unremarkable. Kidneys are normal, without renal calculi, solid lesion, or hydronephrosis. Small volume air in the urinary bladder, presumably secondary to recent catheterization. Stomach/Bowel: Stomach is within normal limits. Incidental descending duodenal diverticulum. Appendix appears normal. No evidence of bowel wall thickening, distention, or inflammatory changes. Pancolonic diverticulosis. Focal thickening of the distal descending colon (series 7, image 123, series 9, image 90). No intraluminal contrast extravasation. Lymphatic: No enlarged abdominal or pelvic lymph nodes. Reproductive: Status post hysterectomy. Other: No abdominal  wall hernia or abnormality. No ascites. Musculoskeletal: No acute osseous findings. IMPRESSION: 1. Pancolonic diverticulosis. Focal thickening of the distal descending colon, suggesting mild, focal diverticulitis, although underlying mass is not excluded. 2. No intraluminal contrast extravasation to specifically localize GI bleeding. 3. Large hiatal  hernia with complete intrathoracic position of the stomach and a small portion of transverse colon, incompletely imaged. 4. 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 5. Small volume air in the urinary bladder, presumably secondary to recent catheterization. 6. Status post cholecystectomy and hysterectomy. Aortic Atherosclerosis (ICD10-I70.0). Electronically Signed   By: Jearld Lesch M.D.   On: 05/21/2023 16:26   (Echo, Carotid, EGD, Colonoscopy, ERCP)    Subjective: Patient seen and examined in the morning rounds.  Denies any complaints.  Husband at the bedside.   Discharge Exam: Vitals:   05/23/23 1430 05/23/23 1511  BP: (!) 143/67 (!) 151/67  Pulse: (!) 56 64  Resp: 19 20  Temp:  99.8 F (37.7 C)  SpO2: 97% 100%   Vitals:   05/23/23 1410 05/23/23 1420 05/23/23 1430 05/23/23 1511  BP:  (!) 140/56 (!) 143/67 (!) 151/67  Pulse: 62 62 (!) 56 64  Resp: 20 19 19 20   Temp:    99.8 F (37.7 C)  TempSrc:    Oral  SpO2: 99% 98% 97% 100%  Weight:      Height:        General: Pt is alert, awake, not in acute distress Cardiovascular: RRR, S1/S2 +, no rubs, no gallops Respiratory: CTA bilaterally, no wheezing, no rhonchi Abdominal: Soft, NT, ND, bowel sounds + Extremities: no edema, no cyanosis    The results of significant diagnostics from this hospitalization (including imaging, microbiology, ancillary and laboratory) are listed below for reference.     Microbiology: No results found for this or any previous visit (from the past 240 hour(s)).   Labs: BNP (last 3 results) No results for input(s): "BNP" in the last 8760 hours. Basic Metabolic Panel: Recent Labs  Lab 05/21/23 1447 05/22/23 0411 05/23/23 0407  NA 136 137 140  K 3.8 4.1 4.1  CL 106 109 111  CO2 23 21* 24  GLUCOSE 114* 111* 116*  BUN 53* 35* 28*  CREATININE 1.30* 1.10* 1.11*  CALCIUM 8.5* 8.0* 8.3*   Liver Function Tests: Recent Labs  Lab  05/21/23 1447  AST 16  ALT 22  ALKPHOS 46  BILITOT 0.9  PROT 6.4*  ALBUMIN 3.7   Recent Labs  Lab 05/21/23 1447  LIPASE 38   No results for input(s): "AMMONIA" in the last 168 hours. CBC: Recent Labs  Lab 05/21/23 1447 05/21/23 2148 05/22/23 0411 05/23/23 0407  WBC 17.4*  --  13.8* 11.7*  NEUTROABS 13.7*  --   --   --   HGB 11.5* 10.9* 10.3* 9.9*  HCT 37.0 34.7* 33.3* 31.7*  MCV 90.9  --  93.3 92.7  PLT 261  --  232 240   Cardiac Enzymes: No results for input(s): "CKTOTAL", "CKMB", "CKMBINDEX", "TROPONINI" in the last 168 hours. BNP: Invalid input(s): "POCBNP" CBG: No results for input(s): "GLUCAP" in the last 168 hours. D-Dimer No results for input(s): "DDIMER" in the last 72 hours. Hgb A1c No results for input(s): "HGBA1C" in the last 72 hours. Lipid Profile No results for input(s): "CHOL", "HDL", "LDLCALC", "TRIG", "CHOLHDL", "LDLDIRECT" in the last 72 hours. Thyroid function studies No results for input(s): "TSH", "T4TOTAL", "T3FREE", "THYROIDAB" in the last 72 hours.  Invalid input(s): "FREET3" Anemia work up No results for input(s): "VITAMINB12", "FOLATE", "FERRITIN", "TIBC", "IRON", "RETICCTPCT" in the last 72 hours. Urinalysis    Component Value Date/Time   COLORURINE YELLOW 09/18/2022 1632   APPEARANCEUR HAZY (A) 09/18/2022 1632   APPEARANCEUR Clear 02/17/2013 1708   LABSPEC 1.023 09/18/2022 1632   LABSPEC 1.004 02/17/2013 1708   PHURINE 5.0 09/18/2022 1632   GLUCOSEU NEGATIVE 09/18/2022 1632   GLUCOSEU Negative 02/17/2013 1708   HGBUR NEGATIVE 09/18/2022 1632   BILIRUBINUR NEGATIVE 09/18/2022 1632   BILIRUBINUR Negative 02/17/2013 1708   KETONESUR NEGATIVE 09/18/2022 1632   PROTEINUR NEGATIVE 09/18/2022 1632   NITRITE NEGATIVE 09/18/2022 1632   LEUKOCYTESUR MODERATE (A) 09/18/2022 1632   LEUKOCYTESUR 1+ 02/17/2013 1708   Sepsis Labs Recent Labs  Lab 05/21/23 1447 05/22/23 0411 05/23/23 0407  WBC 17.4* 13.8* 11.7*   Microbiology No  results found for this or any previous visit (from the past 240 hour(s)).   Time coordinating discharge: 32 minutes  SIGNED:   Dorcas Carrow, MD  [facility] 05/23/2023, 3:21 PM

## 2023-05-24 NOTE — Anesthesia Postprocedure Evaluation (Signed)
Anesthesia Post Note  Patient: Cheryl Goodman  Procedure(s) Performed: ESOPHAGOGASTRODUODENOSCOPY (EGD) WITH PROPOFOL     Patient location during evaluation: Endoscopy Anesthesia Type: MAC Level of consciousness: awake and alert Pain management: pain level controlled Vital Signs Assessment: post-procedure vital signs reviewed and stable Respiratory status: spontaneous breathing Cardiovascular status: stable Anesthetic complications: no  No notable events documented.  Last Vitals:  Vitals:   05/23/23 1430 05/23/23 1511  BP: (!) 143/67 (!) 151/67  Pulse: (!) 56 64  Resp: 19 20  Temp:  37.7 C  SpO2: 97% 100%    Last Pain:  Vitals:   05/23/23 1511  TempSrc: Oral  PainSc:                  Lewie Loron

## 2023-05-28 ENCOUNTER — Encounter (HOSPITAL_COMMUNITY): Payer: Self-pay | Admitting: Gastroenterology

## 2023-05-30 DIAGNOSIS — K5792 Diverticulitis of intestine, part unspecified, without perforation or abscess without bleeding: Secondary | ICD-10-CM | POA: Diagnosis not present

## 2023-05-30 DIAGNOSIS — D500 Iron deficiency anemia secondary to blood loss (chronic): Secondary | ICD-10-CM | POA: Diagnosis not present

## 2023-06-07 DIAGNOSIS — K5792 Diverticulitis of intestine, part unspecified, without perforation or abscess without bleeding: Secondary | ICD-10-CM | POA: Diagnosis not present

## 2023-06-07 DIAGNOSIS — D509 Iron deficiency anemia, unspecified: Secondary | ICD-10-CM | POA: Diagnosis not present

## 2023-06-15 DIAGNOSIS — H524 Presbyopia: Secondary | ICD-10-CM | POA: Diagnosis not present

## 2023-06-15 DIAGNOSIS — H26492 Other secondary cataract, left eye: Secondary | ICD-10-CM | POA: Diagnosis not present

## 2023-06-15 DIAGNOSIS — H3589 Other specified retinal disorders: Secondary | ICD-10-CM | POA: Diagnosis not present

## 2023-06-15 DIAGNOSIS — H25811 Combined forms of age-related cataract, right eye: Secondary | ICD-10-CM | POA: Diagnosis not present

## 2023-07-07 DIAGNOSIS — L570 Actinic keratosis: Secondary | ICD-10-CM | POA: Diagnosis not present

## 2023-07-07 DIAGNOSIS — L720 Epidermal cyst: Secondary | ICD-10-CM | POA: Diagnosis not present

## 2023-07-07 DIAGNOSIS — L738 Other specified follicular disorders: Secondary | ICD-10-CM | POA: Diagnosis not present

## 2023-08-11 DIAGNOSIS — D649 Anemia, unspecified: Secondary | ICD-10-CM | POA: Diagnosis not present

## 2023-08-11 DIAGNOSIS — Z7901 Long term (current) use of anticoagulants: Secondary | ICD-10-CM | POA: Diagnosis not present

## 2023-08-11 DIAGNOSIS — K449 Diaphragmatic hernia without obstruction or gangrene: Secondary | ICD-10-CM | POA: Diagnosis not present

## 2023-08-11 DIAGNOSIS — R935 Abnormal findings on diagnostic imaging of other abdominal regions, including retroperitoneum: Secondary | ICD-10-CM | POA: Diagnosis not present

## 2023-08-30 DIAGNOSIS — K5730 Diverticulosis of large intestine without perforation or abscess without bleeding: Secondary | ICD-10-CM | POA: Diagnosis not present

## 2023-08-30 DIAGNOSIS — K648 Other hemorrhoids: Secondary | ICD-10-CM | POA: Diagnosis not present

## 2023-08-30 DIAGNOSIS — R933 Abnormal findings on diagnostic imaging of other parts of digestive tract: Secondary | ICD-10-CM | POA: Diagnosis not present

## 2023-09-12 DIAGNOSIS — E559 Vitamin D deficiency, unspecified: Secondary | ICD-10-CM | POA: Diagnosis not present

## 2023-09-12 DIAGNOSIS — E782 Mixed hyperlipidemia: Secondary | ICD-10-CM | POA: Diagnosis not present

## 2023-09-12 DIAGNOSIS — M810 Age-related osteoporosis without current pathological fracture: Secondary | ICD-10-CM | POA: Diagnosis not present

## 2023-09-12 DIAGNOSIS — E538 Deficiency of other specified B group vitamins: Secondary | ICD-10-CM | POA: Diagnosis not present

## 2023-09-12 DIAGNOSIS — R739 Hyperglycemia, unspecified: Secondary | ICD-10-CM | POA: Diagnosis not present

## 2023-09-19 DIAGNOSIS — N183 Chronic kidney disease, stage 3 unspecified: Secondary | ICD-10-CM | POA: Diagnosis not present

## 2023-09-19 DIAGNOSIS — M1712 Unilateral primary osteoarthritis, left knee: Secondary | ICD-10-CM | POA: Diagnosis not present

## 2023-09-19 DIAGNOSIS — Z0000 Encounter for general adult medical examination without abnormal findings: Secondary | ICD-10-CM | POA: Diagnosis not present

## 2023-10-13 ENCOUNTER — Inpatient Hospital Stay: Payer: Medicare HMO | Admitting: Internal Medicine

## 2023-10-13 ENCOUNTER — Inpatient Hospital Stay: Payer: Medicare HMO | Attending: Internal Medicine

## 2023-10-13 ENCOUNTER — Encounter: Payer: Self-pay | Admitting: Internal Medicine

## 2023-10-13 ENCOUNTER — Inpatient Hospital Stay: Payer: Medicare HMO

## 2023-10-13 VITALS — BP 118/72 | HR 60 | Temp 98.0°F | Resp 16 | Wt 178.0 lb

## 2023-10-13 DIAGNOSIS — Z8673 Personal history of transient ischemic attack (TIA), and cerebral infarction without residual deficits: Secondary | ICD-10-CM | POA: Diagnosis not present

## 2023-10-13 DIAGNOSIS — E785 Hyperlipidemia, unspecified: Secondary | ICD-10-CM | POA: Diagnosis not present

## 2023-10-13 DIAGNOSIS — K219 Gastro-esophageal reflux disease without esophagitis: Secondary | ICD-10-CM | POA: Diagnosis not present

## 2023-10-13 DIAGNOSIS — Z7901 Long term (current) use of anticoagulants: Secondary | ICD-10-CM | POA: Diagnosis not present

## 2023-10-13 DIAGNOSIS — D509 Iron deficiency anemia, unspecified: Secondary | ICD-10-CM | POA: Insufficient documentation

## 2023-10-13 DIAGNOSIS — M810 Age-related osteoporosis without current pathological fracture: Secondary | ICD-10-CM | POA: Insufficient documentation

## 2023-10-13 DIAGNOSIS — N183 Chronic kidney disease, stage 3 unspecified: Secondary | ICD-10-CM | POA: Diagnosis not present

## 2023-10-13 DIAGNOSIS — D649 Anemia, unspecified: Secondary | ICD-10-CM

## 2023-10-13 DIAGNOSIS — Z86711 Personal history of pulmonary embolism: Secondary | ICD-10-CM | POA: Diagnosis not present

## 2023-10-13 DIAGNOSIS — Z86718 Personal history of other venous thrombosis and embolism: Secondary | ICD-10-CM | POA: Diagnosis not present

## 2023-10-13 DIAGNOSIS — I129 Hypertensive chronic kidney disease with stage 1 through stage 4 chronic kidney disease, or unspecified chronic kidney disease: Secondary | ICD-10-CM | POA: Diagnosis not present

## 2023-10-13 DIAGNOSIS — D500 Iron deficiency anemia secondary to blood loss (chronic): Secondary | ICD-10-CM

## 2023-10-13 LAB — CBC WITH DIFFERENTIAL (CANCER CENTER ONLY)
Abs Immature Granulocytes: 0.06 10*3/uL (ref 0.00–0.07)
Basophils Absolute: 0.1 10*3/uL (ref 0.0–0.1)
Basophils Relative: 1 %
Eosinophils Absolute: 0.1 10*3/uL (ref 0.0–0.5)
Eosinophils Relative: 1 %
HCT: 33.3 % — ABNORMAL LOW (ref 36.0–46.0)
Hemoglobin: 10.2 g/dL — ABNORMAL LOW (ref 12.0–15.0)
Immature Granulocytes: 1 %
Lymphocytes Relative: 15 %
Lymphs Abs: 1.5 10*3/uL (ref 0.7–4.0)
MCH: 27.3 pg (ref 26.0–34.0)
MCHC: 30.6 g/dL (ref 30.0–36.0)
MCV: 89 fL (ref 80.0–100.0)
Monocytes Absolute: 0.8 10*3/uL (ref 0.1–1.0)
Monocytes Relative: 8 %
Neutro Abs: 7.6 10*3/uL (ref 1.7–7.7)
Neutrophils Relative %: 74 %
Platelet Count: 310 10*3/uL (ref 150–400)
RBC: 3.74 MIL/uL — ABNORMAL LOW (ref 3.87–5.11)
RDW: 14.3 % (ref 11.5–15.5)
WBC Count: 10.2 10*3/uL (ref 4.0–10.5)
nRBC: 0 % (ref 0.0–0.2)

## 2023-10-13 LAB — COMPREHENSIVE METABOLIC PANEL
ALT: 18 U/L (ref 0–44)
AST: 19 U/L (ref 15–41)
Albumin: 3.9 g/dL (ref 3.5–5.0)
Alkaline Phosphatase: 51 U/L (ref 38–126)
Anion gap: 11 (ref 5–15)
BUN: 34 mg/dL — ABNORMAL HIGH (ref 8–23)
CO2: 24 mmol/L (ref 22–32)
Calcium: 9.1 mg/dL (ref 8.9–10.3)
Chloride: 105 mmol/L (ref 98–111)
Creatinine, Ser: 1.71 mg/dL — ABNORMAL HIGH (ref 0.44–1.00)
GFR, Estimated: 30 mL/min — ABNORMAL LOW (ref >60)
Glucose, Bld: 105 mg/dL — ABNORMAL HIGH (ref 70–99)
Potassium: 4.2 mmol/L (ref 3.5–5.1)
Sodium: 140 mmol/L (ref 135–145)
Total Bilirubin: 0.8 mg/dL (ref 0.0–1.2)
Total Protein: 6.9 g/dL (ref 6.5–8.1)

## 2023-10-13 LAB — IRON AND TIBC
Iron: 46 ug/dL (ref 28–170)
Saturation Ratios: 10 % — ABNORMAL LOW (ref 10.4–31.8)
TIBC: 445 ug/dL (ref 250–450)
UIBC: 399 ug/dL

## 2023-10-13 LAB — FOLATE: Folate: 11.1 ng/mL (ref >5.9)

## 2023-10-13 LAB — FERRITIN: Ferritin: 14 ng/mL (ref 11–307)

## 2023-10-13 NOTE — Progress Notes (Signed)
 [facility]  Telephone:([phone]) [phone] Fax:(336) [phone]  ID: Cheryl Goodman OB: 10-25-1940  MR#: [id number]  RDW#:[id number]  Patient Care Team: Cleotilde Oneil FALCON, MD as PCP - General (Internal Medicine) Rosina Hough, RN as [facility] Care Management Clista Bimler, MD as Consulting Physician (Oncology)  REFERRING PROVIDER: Dr. Cleotilde  REASON FOR REFERRAL: Iron  deficiency anemia  HPI: Cheryl Goodman is a 83 y.o. female with past medical history of DVT/PE in November 2020, GERD, hypertension, hyperlipidemia, stroke in November 2023 was referred to hematology for management of iron  deficiency anemia.  Patient was admitted at [facility] from 12/27/2022 to 12/29/2022 for melanotic stools.  Upper endoscopy done by Dr. Kriss showed 10 cm hiatal hernia with nonbleeding Ole erosion x 2 at the diaphragmatic pinch.  Mild inflammation in gastric antrum and multiple gastric polyps.  H. pylori testing was negative.  Last colonoscopy was in July 2022 which showed diverticulosis in the sigmoid and transverse colon otherwise normal.  Had small bowel endoscopy in March 2024 which did not show any active bleeding.  Patient has longstanding history of iron  deficiency anemia at least since 2020.  Last treated with IV Venofer  in May 2024.  Interval history Patient seen today as follow-up for iron  deficiency anemia and labs Patient reports fatigue.  Denies any shortness of breath, chest pain.  Denies any bleeding in urine or stools.  REVIEW OF SYSTEMS:   Review of Systems  Constitutional:  Positive for malaise/fatigue.  Musculoskeletal:  Positive for falls.    As per HPI. Otherwise, a complete review of systems is negative.  PAST MEDICAL HISTORY: Past Medical History:  Diagnosis Date   Acute pulmonary embolism with acute cor pulmonale (HCC) 08/10/2019   Anemia    Arthritis    Dysphagia    GERD (gastroesophageal reflux disease)    History of  hiatal hernia    History of kidney stones    x2 ; passed independently   Hyperlipidemia    Hypertension    Nausea    OA (osteoarthritis) of knee 10/31/2017   Sleep apnea    Stroke [facility])     PAST SURGICAL HISTORY: Past Surgical History:  Procedure Laterality Date   ABDOMINAL HYSTERECTOMY     APPENDECTOMY     BIOPSY  12/28/2022   Procedure: BIOPSY;  Surgeon: Kriss Estefana DEL, DO;  Location: [facility] ENDOSCOPY;  Service: Gastroenterology;;   BREAST BIOPSY Right    BREAST SURGERY     CARPAL TUNNEL RELEASE     CHOLECYSTECTOMY     COLONOSCOPY WITH PROPOFOL  N/A 09/01/2015   Procedure: COLONOSCOPY WITH PROPOFOL ;  Surgeon: Lamar ONEIDA Holmes, MD;  Location: [facility] ENDOSCOPY;  Service: Endoscopy;  Laterality: N/A;   COLONOSCOPY WITH PROPOFOL  N/A 04/29/2021   Procedure: COLONOSCOPY WITH PROPOFOL ;  Surgeon: Dessa Reyes ORN, MD;  Location: [facility] ENDOSCOPY;  Service: Gastroenterology;  Laterality: N/A;   ESOPHAGOGASTRODUODENOSCOPY N/A 07/17/2019   Procedure: ESOPHAGOGASTRODUODENOSCOPY (EGD);  Surgeon: Janalyn Keene NOVAK, MD;  Location: [facility] ENDOSCOPY;  Service: Endoscopy;  Laterality: N/A;   ESOPHAGOGASTRODUODENOSCOPY N/A 12/28/2022   Procedure: ESOPHAGOGASTRODUODENOSCOPY (EGD);  Surgeon: Kriss Estefana DEL, DO;  Location: [facility] ENDOSCOPY;  Service: Gastroenterology;  Laterality: N/A;   ESOPHAGOGASTRODUODENOSCOPY (EGD) WITH PROPOFOL  N/A 04/29/2021   Procedure: ESOPHAGOGASTRODUODENOSCOPY (EGD) WITH PROPOFOL ;  Surgeon: Dessa Reyes ORN, MD;  Location: [facility] ENDOSCOPY;  Service: Gastroenterology;  Laterality: N/A;   ESOPHAGOGASTRODUODENOSCOPY (EGD) WITH PROPOFOL  N/A 09/21/2022   Procedure: ESOPHAGOGASTRODUODENOSCOPY (EGD) WITH PROPOFOL ;  Surgeon: Burnette Fallow, MD;  Location: [facility] ENDOSCOPY;  Service: Gastroenterology;  Laterality: N/A;   ESOPHAGOGASTRODUODENOSCOPY (EGD) WITH PROPOFOL  N/A 05/23/2023   Procedure: ESOPHAGOGASTRODUODENOSCOPY (EGD) WITH PROPOFOL ;  Surgeon: Burnette Fallow, MD;  Location: [facility]  ENDOSCOPY;  Service: Gastroenterology;  Laterality: N/A;   IR KYPHO LUMBAR INC FX REDUCE BONE BX UNI/BIL CANNULATION INC/IMAGING  10/19/2022   IR RADIOLOGIST EVAL & MGMT  11/04/2022   REDUCTION MAMMAPLASTY Bilateral    SAVORY DILATION N/A 09/01/2015   Procedure: SAVORY DILATION;  Surgeon: Lamar ONEIDA Holmes, MD;  Location: [facility] ENDOSCOPY;  Service: Endoscopy;  Laterality: N/A;   TOTAL KNEE ARTHROPLASTY Right 10/31/2017   Procedure: RIGHT TOTAL KNEE ARTHROPLASTY;  Surgeon: Melodi Lerner, MD;  Location: [facility] ORS;  Service: Orthopedics;  Laterality: Right;    FAMILY HISTORY: Family History  Problem Relation Age of Onset   Breast cancer Maternal Grandmother        in 34's   Heart disease Neg Hx     HEALTH MAINTENANCE: Social History   Tobacco Use   Smoking status: Never   Smokeless tobacco: Never  Vaping Use   Vaping status: Never Used     Allergies  Allergen Reactions   Sulfa Antibiotics Anaphylaxis and Swelling   Dexlansoprazole Other (See Comments)    Syncope    Ace Inhibitors Cough   Oxycodone  Nausea Only   Penicillins Rash    Has patient had a PCN reaction causing immediate rash, facial/tongue/throat swelling, SOB or lightheadedness with hypotension: No Has patient had a PCN reaction causing severe rash involving mucus membranes or skin necrosis: No Has patient had a PCN reaction that required hospitalization: No Has patient had a PCN reaction occurring within the last 10 years: No If all of the above answers are NO, then may proceed with Cephalosporin use.     Current Outpatient Medications  Medication Sig Dispense Refill   acetaminophen  (TYLENOL ) 325 MG tablet Take 325-650 mg by mouth every 6 (six) hours as needed for headache or mild pain.     apixaban  (ELIQUIS ) 2.5 MG TABS tablet Take 2.5 mg by mouth 2 (two) times daily.     Cholecalciferol  (VITAMIN D3) 125 MCG (5000 UT) TABS Take 5,000 Units by mouth daily.     HYDROcodone -acetaminophen  (NORCO/VICODIN) 5-325 MG  tablet Take 1 tablet by mouth every 6 (six) hours as needed. (Patient taking differently: Take 1 tablet by mouth every 6 (six) hours as needed for moderate pain (pain score 4-6).) 8 tablet 0   polyethylene glycol (MIRALAX  / GLYCOLAX ) 17 g packet Take 17 g by mouth daily. 30 each 0   PROLIA  60 MG/ML SOSY injection Inject 60 mg into the skin every 6 (six) months.     simvastatin  (ZOCOR ) 20 MG tablet Take 20 mg by mouth at bedtime.     sucralfate  (CARAFATE ) 1 g tablet Take 1 g by mouth in the morning and at bedtime.     traMADol  (ULTRAM ) 50 MG tablet Take 50 mg by mouth in the morning.     acetaminophen -codeine  (TYLENOL  #3) 300-30 MG tablet Take 1 tablet by mouth every 4 (four) hours as needed for moderate pain. (Patient not taking: Reported on 10/13/2023)     pantoprazole  (PROTONIX ) 40 MG tablet Take 1 tablet (40 mg total) by mouth daily. 30 tablet 1   triamterene -hydrochlorothiazide  (DYAZIDE ) 37.5-25 MG capsule Take 1 capsule by mouth daily. (Patient not taking: Reported on 10/13/2023)     No current facility-administered medications for this visit.    OBJECTIVE: Vitals:   10/13/23 0957  BP: 118/72  Pulse:  60  Resp: 16  Temp: 98 F (36.7 C)  SpO2: 100%     Body mass index is 37.2 kg/m.      General: Well-developed, well-nourished, no acute distress. Eyes: Pink conjunctiva, anicteric sclera. HEENT: Normocephalic, moist mucous membranes, clear oropharnyx. Lungs: Clear to auscultation bilaterally. Heart: Regular rate and rhythm. No rubs, murmurs, or gallops. Abdomen: Soft, nontender, nondistended. No organomegaly noted, normoactive bowel sounds. Musculoskeletal: No edema, cyanosis, or clubbing. Neuro: Alert, answering all questions appropriately. Cranial nerves grossly intact. Skin: No rashes or petechiae noted. Psych: Normal affect. Lymphatics: No cervical, calvicular, axillary or inguinal LAD.   LAB RESULTS:  Lab Results  Component Value Date   NA 140 10/13/2023   K 4.2  10/13/2023   CL 105 10/13/2023   CO2 24 10/13/2023   GLUCOSE 105 (H) 10/13/2023   BUN 34 (H) 10/13/2023   CREATININE 1.71 (H) 10/13/2023   CALCIUM 9.1 10/13/2023   PROT 6.9 10/13/2023   ALBUMIN  3.9 10/13/2023   AST 19 10/13/2023   ALT 18 10/13/2023   ALKPHOS 51 10/13/2023   BILITOT 0.8 10/13/2023   GFRNONAA 30 (L) 10/13/2023   GFRAA >60 08/06/2019    Lab Results  Component Value Date   WBC 10.2 10/13/2023   NEUTROABS 7.6 10/13/2023   HGB 10.2 (L) 10/13/2023   HCT 33.3 (L) 10/13/2023   MCV 89.0 10/13/2023   PLT 310 10/13/2023    Lab Results  Component Value Date   TIBC 445 10/13/2023   TIBC 378 04/12/2023   TIBC 459 (H) 12/27/2022   FERRITIN 14 10/13/2023   FERRITIN 206 04/12/2023   FERRITIN 14 12/27/2022   IRONPCTSAT 10 (L) 10/13/2023   IRONPCTSAT 14 04/12/2023   IRONPCTSAT 9 (L) 12/27/2022     STUDIES: No results found.  ASSESSMENT AND PLAN:   Cheryl Goodman is a 83 y.o. female with pmh of DVT/PE in November 2020, GERD, hypertension, hyperlipidemia, stroke in November 2023 was referred to hematology for management of iron  deficiency anemia.  # Iron  deficiency anemia -Of unknown etiology. ?  Malabsorption.  Ongoing at least since 2020.  - Admitted at [facility] from 12/27/2022 to 12/29/2022 for melanotic stools.  Upper endoscopy done by Dr. Kriss showed 10 cm hiatal hernia with nonbleeding Ole erosion x 2 at the diaphragmatic pinch.  Mild inflammation in gastric antrum and multiple gastric polyps.  H. pylori testing was negative.  Last colonoscopy was in July 2022 which showed diverticulosis in the sigmoid and transverse colon otherwise normal.  Capsule endoscopy in March 2024 with no evidence of bleeding.  -Labs reviewed from today.  Hemoglobin is down to 10.2.  Ferritin 14 with saturation of 10%.  I have sent a scheduling message to schedule for IV Venofer  200 mg weekly x 5 doses.  She has completed all the GI workup as above with no evidence of  bleeding noted.  Will continue to monitor her counts.  She will be seeing her PCP Dr. Cleotilde in May 2025.  So I will see her back in 6 months.   # History of unprovoked DVT/PE # History of stroke -On Eliquis .  Was dose reduced to 2.5 mg twice daily by Dr. Cleotilde after GI bleed.  # CKD stage III -Unclear etiology.  Monitor.  # GERD -On Protonix  twice daily.  # Hypertension-on triamterene  HCTZ.  # Osteoporosis-follows with Endo  Orders Placed This Encounter  Procedures   Multiple Myeloma Panel (SPEP&IFE w/QIG)   Kappa/lambda light chains   Erythropoietin   Comprehensive metabolic panel   Folate   CBC with Differential (Cancer Center Only)   Ferritin   Iron  and TIBC([facility])   RTC in 6 months for MD visit, labs, possible Venofer   Patient expressed understanding and was in agreement with this plan. She also understands that She can call clinic at any time with any questions, concerns, or complaints.   I spent a total of 30 minutes reviewing chart data, face-to-face evaluation with the patient, counseling and coordination of care as detailed above.  Aryonna Gunnerson, MD   10/13/2023 1:21 PM

## 2023-10-14 LAB — ERYTHROPOIETIN: Erythropoietin: 20.7 m[IU]/mL — ABNORMAL HIGH (ref 2.6–18.5)

## 2023-10-14 LAB — KAPPA/LAMBDA LIGHT CHAINS
Kappa free light chain: 37.1 mg/L — ABNORMAL HIGH (ref 3.3–19.4)
Kappa, lambda light chain ratio: 2.26 — ABNORMAL HIGH (ref 0.26–1.65)
Lambda free light chains: 16.4 mg/L (ref 5.7–26.3)

## 2023-10-19 ENCOUNTER — Telehealth: Payer: Self-pay | Admitting: Internal Medicine

## 2023-10-19 NOTE — Telephone Encounter (Signed)
 Patient called to reschedule her Friday 1/17 appointment to Thursday 1/16- appointment changed and confirmed.

## 2023-10-20 ENCOUNTER — Inpatient Hospital Stay: Payer: Medicare HMO

## 2023-10-20 VITALS — BP 117/81 | HR 88 | Temp 98.8°F | Resp 19

## 2023-10-20 DIAGNOSIS — E785 Hyperlipidemia, unspecified: Secondary | ICD-10-CM | POA: Diagnosis not present

## 2023-10-20 DIAGNOSIS — N183 Chronic kidney disease, stage 3 unspecified: Secondary | ICD-10-CM | POA: Diagnosis not present

## 2023-10-20 DIAGNOSIS — Z86711 Personal history of pulmonary embolism: Secondary | ICD-10-CM | POA: Diagnosis not present

## 2023-10-20 DIAGNOSIS — I129 Hypertensive chronic kidney disease with stage 1 through stage 4 chronic kidney disease, or unspecified chronic kidney disease: Secondary | ICD-10-CM | POA: Diagnosis not present

## 2023-10-20 DIAGNOSIS — M810 Age-related osteoporosis without current pathological fracture: Secondary | ICD-10-CM | POA: Diagnosis not present

## 2023-10-20 DIAGNOSIS — Z8673 Personal history of transient ischemic attack (TIA), and cerebral infarction without residual deficits: Secondary | ICD-10-CM | POA: Diagnosis not present

## 2023-10-20 DIAGNOSIS — Z86718 Personal history of other venous thrombosis and embolism: Secondary | ICD-10-CM | POA: Diagnosis not present

## 2023-10-20 DIAGNOSIS — K219 Gastro-esophageal reflux disease without esophagitis: Secondary | ICD-10-CM | POA: Diagnosis not present

## 2023-10-20 DIAGNOSIS — D500 Iron deficiency anemia secondary to blood loss (chronic): Secondary | ICD-10-CM

## 2023-10-20 DIAGNOSIS — D509 Iron deficiency anemia, unspecified: Secondary | ICD-10-CM | POA: Diagnosis not present

## 2023-10-20 LAB — MULTIPLE MYELOMA PANEL, SERUM
Albumin SerPl Elph-Mcnc: 3.4 g/dL (ref 2.9–4.4)
Albumin/Glob SerPl: 1.2 (ref 0.7–1.7)
Alpha 1: 0.3 g/dL (ref 0.0–0.4)
Alpha2 Glob SerPl Elph-Mcnc: 1 g/dL (ref 0.4–1.0)
B-Globulin SerPl Elph-Mcnc: 1.1 g/dL (ref 0.7–1.3)
Gamma Glob SerPl Elph-Mcnc: 0.5 g/dL (ref 0.4–1.8)
Globulin, Total: 2.9 g/dL (ref 2.2–3.9)
IgA: 97 mg/dL (ref 64–422)
IgG (Immunoglobin G), Serum: 669 mg/dL (ref 586–1602)
IgM (Immunoglobulin M), Srm: 60 mg/dL (ref 26–217)
Total Protein ELP: 6.3 g/dL (ref 6.0–8.5)

## 2023-10-20 MED ORDER — IRON SUCROSE 20 MG/ML IV SOLN
200.0000 mg | Freq: Once | INTRAVENOUS | Status: AC
  Administered 2023-10-20: 200 mg via INTRAVENOUS
  Filled 2023-10-20: qty 10

## 2023-10-20 MED ORDER — SODIUM CHLORIDE 0.9% FLUSH
10.0000 mL | Freq: Once | INTRAVENOUS | Status: AC | PRN
  Administered 2023-10-20: 10 mL
  Filled 2023-10-20: qty 10

## 2023-10-21 ENCOUNTER — Inpatient Hospital Stay: Payer: Medicare HMO

## 2023-10-28 ENCOUNTER — Inpatient Hospital Stay: Payer: Medicare HMO

## 2023-10-28 VITALS — BP 117/73 | HR 82 | Temp 97.3°F | Resp 18

## 2023-10-28 DIAGNOSIS — Z86711 Personal history of pulmonary embolism: Secondary | ICD-10-CM | POA: Diagnosis not present

## 2023-10-28 DIAGNOSIS — I129 Hypertensive chronic kidney disease with stage 1 through stage 4 chronic kidney disease, or unspecified chronic kidney disease: Secondary | ICD-10-CM | POA: Diagnosis not present

## 2023-10-28 DIAGNOSIS — E785 Hyperlipidemia, unspecified: Secondary | ICD-10-CM | POA: Diagnosis not present

## 2023-10-28 DIAGNOSIS — Z8673 Personal history of transient ischemic attack (TIA), and cerebral infarction without residual deficits: Secondary | ICD-10-CM | POA: Diagnosis not present

## 2023-10-28 DIAGNOSIS — Z86718 Personal history of other venous thrombosis and embolism: Secondary | ICD-10-CM | POA: Diagnosis not present

## 2023-10-28 DIAGNOSIS — N183 Chronic kidney disease, stage 3 unspecified: Secondary | ICD-10-CM | POA: Diagnosis not present

## 2023-10-28 DIAGNOSIS — K219 Gastro-esophageal reflux disease without esophagitis: Secondary | ICD-10-CM | POA: Diagnosis not present

## 2023-10-28 DIAGNOSIS — D500 Iron deficiency anemia secondary to blood loss (chronic): Secondary | ICD-10-CM

## 2023-10-28 DIAGNOSIS — M810 Age-related osteoporosis without current pathological fracture: Secondary | ICD-10-CM | POA: Diagnosis not present

## 2023-10-28 DIAGNOSIS — D509 Iron deficiency anemia, unspecified: Secondary | ICD-10-CM | POA: Diagnosis not present

## 2023-10-28 MED ORDER — IRON SUCROSE 20 MG/ML IV SOLN
200.0000 mg | Freq: Once | INTRAVENOUS | Status: AC
  Administered 2023-10-28: 200 mg via INTRAVENOUS

## 2023-10-28 NOTE — Patient Instructions (Signed)

## 2023-11-02 DIAGNOSIS — R531 Weakness: Secondary | ICD-10-CM | POA: Diagnosis not present

## 2023-11-02 DIAGNOSIS — M810 Age-related osteoporosis without current pathological fracture: Secondary | ICD-10-CM | POA: Diagnosis not present

## 2023-11-04 ENCOUNTER — Inpatient Hospital Stay: Payer: Medicare HMO

## 2023-11-04 VITALS — BP 114/64 | HR 62 | Temp 97.8°F | Resp 18

## 2023-11-04 DIAGNOSIS — M810 Age-related osteoporosis without current pathological fracture: Secondary | ICD-10-CM | POA: Diagnosis not present

## 2023-11-04 DIAGNOSIS — K219 Gastro-esophageal reflux disease without esophagitis: Secondary | ICD-10-CM | POA: Diagnosis not present

## 2023-11-04 DIAGNOSIS — Z86718 Personal history of other venous thrombosis and embolism: Secondary | ICD-10-CM | POA: Diagnosis not present

## 2023-11-04 DIAGNOSIS — D509 Iron deficiency anemia, unspecified: Secondary | ICD-10-CM | POA: Diagnosis not present

## 2023-11-04 DIAGNOSIS — D500 Iron deficiency anemia secondary to blood loss (chronic): Secondary | ICD-10-CM

## 2023-11-04 DIAGNOSIS — N183 Chronic kidney disease, stage 3 unspecified: Secondary | ICD-10-CM | POA: Diagnosis not present

## 2023-11-04 DIAGNOSIS — Z86711 Personal history of pulmonary embolism: Secondary | ICD-10-CM | POA: Diagnosis not present

## 2023-11-04 DIAGNOSIS — Z8673 Personal history of transient ischemic attack (TIA), and cerebral infarction without residual deficits: Secondary | ICD-10-CM | POA: Diagnosis not present

## 2023-11-04 DIAGNOSIS — E785 Hyperlipidemia, unspecified: Secondary | ICD-10-CM | POA: Diagnosis not present

## 2023-11-04 DIAGNOSIS — I129 Hypertensive chronic kidney disease with stage 1 through stage 4 chronic kidney disease, or unspecified chronic kidney disease: Secondary | ICD-10-CM | POA: Diagnosis not present

## 2023-11-04 MED ORDER — SODIUM CHLORIDE 0.9% FLUSH
10.0000 mL | Freq: Once | INTRAVENOUS | Status: AC | PRN
  Administered 2023-11-04: 10 mL
  Filled 2023-11-04: qty 10

## 2023-11-04 MED ORDER — IRON SUCROSE 20 MG/ML IV SOLN
200.0000 mg | Freq: Once | INTRAVENOUS | Status: AC
Start: 2023-11-04 — End: 2023-11-04
  Administered 2023-11-04: 200 mg via INTRAVENOUS
  Filled 2023-11-04: qty 10

## 2023-11-11 ENCOUNTER — Inpatient Hospital Stay: Payer: Medicare HMO | Attending: Internal Medicine

## 2023-11-11 VITALS — BP 112/67 | HR 65 | Temp 98.8°F | Resp 18

## 2023-11-11 DIAGNOSIS — D509 Iron deficiency anemia, unspecified: Secondary | ICD-10-CM | POA: Insufficient documentation

## 2023-11-11 DIAGNOSIS — D500 Iron deficiency anemia secondary to blood loss (chronic): Secondary | ICD-10-CM

## 2023-11-11 MED ORDER — IRON SUCROSE 20 MG/ML IV SOLN
200.0000 mg | Freq: Once | INTRAVENOUS | Status: AC
  Administered 2023-11-11: 200 mg via INTRAVENOUS
  Filled 2023-11-11: qty 10

## 2023-11-11 NOTE — Patient Instructions (Signed)

## 2023-11-18 ENCOUNTER — Inpatient Hospital Stay: Payer: Medicare HMO

## 2023-11-18 VITALS — BP 108/59 | HR 61 | Resp 18

## 2023-11-18 DIAGNOSIS — D509 Iron deficiency anemia, unspecified: Secondary | ICD-10-CM | POA: Diagnosis not present

## 2023-11-18 DIAGNOSIS — D500 Iron deficiency anemia secondary to blood loss (chronic): Secondary | ICD-10-CM

## 2023-11-18 MED ORDER — SODIUM CHLORIDE 0.9% FLUSH
10.0000 mL | Freq: Once | INTRAVENOUS | Status: AC | PRN
  Administered 2023-11-18: 10 mL
  Filled 2023-11-18: qty 10

## 2023-11-18 MED ORDER — IRON SUCROSE 20 MG/ML IV SOLN
200.0000 mg | Freq: Once | INTRAVENOUS | Status: AC
Start: 2023-11-18 — End: 2023-11-18
  Administered 2023-11-18: 200 mg via INTRAVENOUS
  Filled 2023-11-18: qty 10

## 2023-11-30 DIAGNOSIS — R5383 Other fatigue: Secondary | ICD-10-CM | POA: Diagnosis not present

## 2023-11-30 DIAGNOSIS — I2089 Other forms of angina pectoris: Secondary | ICD-10-CM | POA: Diagnosis not present

## 2023-11-30 DIAGNOSIS — J189 Pneumonia, unspecified organism: Secondary | ICD-10-CM | POA: Diagnosis not present

## 2023-12-06 ENCOUNTER — Inpatient Hospital Stay (HOSPITAL_COMMUNITY)
Admission: EM | Admit: 2023-12-06 | Discharge: 2023-12-10 | DRG: 813 | Disposition: A | Discharge status: Home / Self Care | Source: Ambulatory Visit | Attending: Family Medicine | Admitting: Family Medicine

## 2023-12-06 ENCOUNTER — Emergency Department (HOSPITAL_COMMUNITY)

## 2023-12-06 ENCOUNTER — Encounter (HOSPITAL_COMMUNITY): Payer: Self-pay

## 2023-12-06 ENCOUNTER — Other Ambulatory Visit: Payer: Self-pay

## 2023-12-06 DIAGNOSIS — D500 Iron deficiency anemia secondary to blood loss (chronic): Secondary | ICD-10-CM | POA: Diagnosis not present

## 2023-12-06 DIAGNOSIS — Z90710 Acquired absence of both cervix and uterus: Secondary | ICD-10-CM | POA: Diagnosis not present

## 2023-12-06 DIAGNOSIS — K625 Hemorrhage of anus and rectum: Secondary | ICD-10-CM | POA: Diagnosis not present

## 2023-12-06 DIAGNOSIS — Z6837 Body mass index (BMI) 37.0-37.9, adult: Secondary | ICD-10-CM

## 2023-12-06 DIAGNOSIS — I4891 Unspecified atrial fibrillation: Secondary | ICD-10-CM | POA: Diagnosis present

## 2023-12-06 DIAGNOSIS — Z86711 Personal history of pulmonary embolism: Secondary | ICD-10-CM | POA: Diagnosis not present

## 2023-12-06 DIAGNOSIS — Z7983 Long term (current) use of bisphosphonates: Secondary | ICD-10-CM

## 2023-12-06 DIAGNOSIS — K921 Melena: Principal | ICD-10-CM | POA: Diagnosis present

## 2023-12-06 DIAGNOSIS — T45515A Adverse effect of anticoagulants, initial encounter: Secondary | ICD-10-CM | POA: Diagnosis present

## 2023-12-06 DIAGNOSIS — Z7952 Long term (current) use of systemic steroids: Secondary | ICD-10-CM

## 2023-12-06 DIAGNOSIS — Z96651 Presence of right artificial knee joint: Secondary | ICD-10-CM | POA: Diagnosis present

## 2023-12-06 DIAGNOSIS — D62 Acute posthemorrhagic anemia: Secondary | ICD-10-CM | POA: Diagnosis present

## 2023-12-06 DIAGNOSIS — Z8701 Personal history of pneumonia (recurrent): Secondary | ICD-10-CM | POA: Diagnosis not present

## 2023-12-06 DIAGNOSIS — J9811 Atelectasis: Secondary | ICD-10-CM | POA: Diagnosis not present

## 2023-12-06 DIAGNOSIS — Z79891 Long term (current) use of opiate analgesic: Secondary | ICD-10-CM

## 2023-12-06 DIAGNOSIS — Z8711 Personal history of peptic ulcer disease: Secondary | ICD-10-CM

## 2023-12-06 DIAGNOSIS — M1711 Unilateral primary osteoarthritis, right knee: Secondary | ICD-10-CM | POA: Diagnosis present

## 2023-12-06 DIAGNOSIS — R195 Other fecal abnormalities: Secondary | ICD-10-CM | POA: Diagnosis not present

## 2023-12-06 DIAGNOSIS — I10 Essential (primary) hypertension: Secondary | ICD-10-CM | POA: Diagnosis present

## 2023-12-06 DIAGNOSIS — K922 Gastrointestinal hemorrhage, unspecified: Secondary | ICD-10-CM | POA: Diagnosis not present

## 2023-12-06 DIAGNOSIS — Z87442 Personal history of urinary calculi: Secondary | ICD-10-CM

## 2023-12-06 DIAGNOSIS — E66812 Obesity, class 2: Secondary | ICD-10-CM | POA: Diagnosis not present

## 2023-12-06 DIAGNOSIS — K219 Gastro-esophageal reflux disease without esophagitis: Secondary | ICD-10-CM | POA: Diagnosis present

## 2023-12-06 DIAGNOSIS — Z7901 Long term (current) use of anticoagulants: Secondary | ICD-10-CM | POA: Diagnosis not present

## 2023-12-06 DIAGNOSIS — D649 Anemia, unspecified: Principal | ICD-10-CM

## 2023-12-06 DIAGNOSIS — Z79899 Other long term (current) drug therapy: Secondary | ICD-10-CM

## 2023-12-06 DIAGNOSIS — Z87892 Personal history of anaphylaxis: Secondary | ICD-10-CM

## 2023-12-06 DIAGNOSIS — D6832 Hemorrhagic disorder due to extrinsic circulating anticoagulants: Principal | ICD-10-CM | POA: Diagnosis present

## 2023-12-06 DIAGNOSIS — E785 Hyperlipidemia, unspecified: Secondary | ICD-10-CM | POA: Diagnosis not present

## 2023-12-06 DIAGNOSIS — R0602 Shortness of breath: Secondary | ICD-10-CM | POA: Diagnosis not present

## 2023-12-06 DIAGNOSIS — M810 Age-related osteoporosis without current pathological fracture: Secondary | ICD-10-CM | POA: Diagnosis present

## 2023-12-06 DIAGNOSIS — N1831 Chronic kidney disease, stage 3a: Secondary | ICD-10-CM | POA: Diagnosis present

## 2023-12-06 DIAGNOSIS — I129 Hypertensive chronic kidney disease with stage 1 through stage 4 chronic kidney disease, or unspecified chronic kidney disease: Secondary | ICD-10-CM | POA: Diagnosis present

## 2023-12-06 DIAGNOSIS — Z8719 Personal history of other diseases of the digestive system: Secondary | ICD-10-CM | POA: Diagnosis not present

## 2023-12-06 DIAGNOSIS — E559 Vitamin D deficiency, unspecified: Secondary | ICD-10-CM | POA: Diagnosis present

## 2023-12-06 DIAGNOSIS — Z8673 Personal history of transient ischemic attack (TIA), and cerebral infarction without residual deficits: Secondary | ICD-10-CM | POA: Diagnosis not present

## 2023-12-06 DIAGNOSIS — Z888 Allergy status to other drugs, medicaments and biological substances status: Secondary | ICD-10-CM

## 2023-12-06 DIAGNOSIS — Z86718 Personal history of other venous thrombosis and embolism: Secondary | ICD-10-CM | POA: Diagnosis not present

## 2023-12-06 DIAGNOSIS — Z882 Allergy status to sulfonamides status: Secondary | ICD-10-CM

## 2023-12-06 DIAGNOSIS — Z880 Allergy status to penicillin: Secondary | ICD-10-CM

## 2023-12-06 DIAGNOSIS — K449 Diaphragmatic hernia without obstruction or gangrene: Secondary | ICD-10-CM | POA: Diagnosis not present

## 2023-12-06 DIAGNOSIS — R5383 Other fatigue: Secondary | ICD-10-CM | POA: Diagnosis not present

## 2023-12-06 DIAGNOSIS — Z885 Allergy status to narcotic agent status: Secondary | ICD-10-CM

## 2023-12-06 DIAGNOSIS — Z803 Family history of malignant neoplasm of breast: Secondary | ICD-10-CM

## 2023-12-06 LAB — COMPREHENSIVE METABOLIC PANEL
ALT: 15 U/L (ref 0–44)
AST: 19 U/L (ref 15–41)
Albumin: 3.4 g/dL — ABNORMAL LOW (ref 3.5–5.0)
Alkaline Phosphatase: 32 U/L — ABNORMAL LOW (ref 38–126)
Anion gap: 9 (ref 5–15)
BUN: 50 mg/dL — ABNORMAL HIGH (ref 8–23)
CO2: 24 mmol/L (ref 22–32)
Calcium: 8.6 mg/dL — ABNORMAL LOW (ref 8.9–10.3)
Chloride: 105 mmol/L (ref 98–111)
Creatinine, Ser: 1.35 mg/dL — ABNORMAL HIGH (ref 0.44–1.00)
GFR, Estimated: 39 mL/min — ABNORMAL LOW (ref >60)
Glucose, Bld: 211 mg/dL — ABNORMAL HIGH (ref 70–99)
Potassium: 4.5 mmol/L (ref 3.5–5.1)
Sodium: 138 mmol/L (ref 135–145)
Total Bilirubin: 0.7 mg/dL (ref 0.0–1.2)
Total Protein: 5.8 g/dL — ABNORMAL LOW (ref 6.5–8.1)

## 2023-12-06 LAB — CBC WITH DIFFERENTIAL/PLATELET
Abs Immature Granulocytes: 0.24 10*3/uL — ABNORMAL HIGH (ref 0.00–0.07)
Basophils Absolute: 0 10*3/uL (ref 0.0–0.1)
Basophils Relative: 0 %
Eosinophils Absolute: 0 10*3/uL (ref 0.0–0.5)
Eosinophils Relative: 0 %
HCT: 22.5 % — ABNORMAL LOW (ref 36.0–46.0)
Hemoglobin: 6.5 g/dL — CL (ref 12.0–15.0)
Immature Granulocytes: 2 %
Lymphocytes Relative: 11 %
Lymphs Abs: 1.7 10*3/uL (ref 0.7–4.0)
MCH: 29.4 pg (ref 26.0–34.0)
MCHC: 28.9 g/dL — ABNORMAL LOW (ref 30.0–36.0)
MCV: 101.8 fL — ABNORMAL HIGH (ref 80.0–100.0)
Monocytes Absolute: 0.6 10*3/uL (ref 0.1–1.0)
Monocytes Relative: 4 %
Neutro Abs: 12.4 10*3/uL — ABNORMAL HIGH (ref 1.7–7.7)
Neutrophils Relative %: 83 %
Platelets: 349 10*3/uL (ref 150–400)
RBC: 2.21 MIL/uL — ABNORMAL LOW (ref 3.87–5.11)
RDW: 18.5 % — ABNORMAL HIGH (ref 11.5–15.5)
WBC: 14.9 10*3/uL — ABNORMAL HIGH (ref 4.0–10.5)
nRBC: 0.5 % — ABNORMAL HIGH (ref 0.0–0.2)

## 2023-12-06 LAB — IRON AND TIBC
Iron: 60 ug/dL (ref 28–170)
Saturation Ratios: 19 % (ref 10.4–31.8)
TIBC: 309 ug/dL (ref 250–450)
UIBC: 249 ug/dL

## 2023-12-06 LAB — POC OCCULT BLOOD, ED: Fecal Occult Bld: POSITIVE — AB

## 2023-12-06 LAB — FERRITIN: Ferritin: 84 ng/mL (ref 11–307)

## 2023-12-06 LAB — VITAMIN B12: Vitamin B-12: 2873 pg/mL — ABNORMAL HIGH (ref 180–914)

## 2023-12-06 LAB — PREPARE RBC (CROSSMATCH)

## 2023-12-06 MED ORDER — FLEET ENEMA RE ENEM: 1.0000 | ENEMA | Freq: Once | RECTAL | Status: DC | PRN

## 2023-12-06 MED ORDER — ONDANSETRON HCL 4 MG/2ML IJ SOLN
4.0000 mg | Freq: Four times a day (QID) | INTRAMUSCULAR | Status: DC | PRN
Start: 2023-12-06 — End: 2023-12-10

## 2023-12-06 MED ORDER — DEXTROSE-SODIUM CHLORIDE 5-0.45 % IV SOLN: INTRAVENOUS | Status: AC

## 2023-12-06 MED ORDER — HYDROMORPHONE HCL 1 MG/ML IJ SOLN: 0.5000 mg | INTRAMUSCULAR | Status: DC | PRN

## 2023-12-06 MED ORDER — ACETAMINOPHEN 650 MG RE SUPP: 650.0000 mg | Freq: Four times a day (QID) | RECTAL | Status: DC | PRN

## 2023-12-06 MED ORDER — BISACODYL 5 MG PO TBEC
5.0000 mg | DELAYED_RELEASE_TABLET | Freq: Every day | ORAL | Status: DC | PRN
  Administered 2023-12-07: 5 mg via ORAL
  Filled 2023-12-06: qty 1

## 2023-12-06 MED ORDER — NICOTINE 21 MG/24HR TD PT24: 21.0000 mg | MEDICATED_PATCH | Freq: Every day | TRANSDERMAL | Status: DC | PRN

## 2023-12-06 MED ORDER — SODIUM CHLORIDE 0.9% IV SOLUTION: Freq: Once | INTRAVENOUS | Status: AC

## 2023-12-06 MED ORDER — OXYCODONE HCL 5 MG PO TABS: 5.0000 mg | ORAL_TABLET | ORAL | Status: DC | PRN

## 2023-12-06 MED ORDER — HYDRALAZINE HCL 20 MG/ML IJ SOLN: 10.0000 mg | INTRAMUSCULAR | Status: DC | PRN

## 2023-12-06 MED ORDER — GUAIFENESIN ER 600 MG PO TB12: 600.0000 mg | ORAL_TABLET | Freq: Two times a day (BID) | ORAL | Status: DC | PRN

## 2023-12-06 MED ORDER — SENNOSIDES-DOCUSATE SODIUM 8.6-50 MG PO TABS: 1.0000 | ORAL_TABLET | Freq: Every evening | ORAL | Status: DC | PRN

## 2023-12-06 MED ORDER — METOPROLOL TARTRATE 5 MG/5ML IV SOLN: 10.0000 mg | INTRAVENOUS | Status: DC | PRN

## 2023-12-06 MED ORDER — PANTOPRAZOLE SODIUM 40 MG IV SOLR
80.0000 mg | Freq: Once | INTRAVENOUS | Status: AC
  Administered 2023-12-06: 80 mg via INTRAVENOUS
  Filled 2023-12-06: qty 20

## 2023-12-06 MED ORDER — SODIUM CHLORIDE 0.9 % IV BOLUS
1000.0000 mL | Freq: Once | INTRAVENOUS | Status: AC
Start: 2023-12-06 — End: 2023-12-06
  Administered 2023-12-06: 1000 mL via INTRAVENOUS

## 2023-12-06 MED ORDER — ONDANSETRON HCL 4 MG PO TABS: 4.0000 mg | ORAL_TABLET | Freq: Four times a day (QID) | ORAL | Status: DC | PRN

## 2023-12-06 MED ORDER — IPRATROPIUM-ALBUTEROL 0.5-2.5 (3) MG/3ML IN SOLN: 3.0000 mL | RESPIRATORY_TRACT | Status: DC | PRN

## 2023-12-06 MED ORDER — TRAZODONE HCL 50 MG PO TABS: 50.0000 mg | ORAL_TABLET | Freq: Every evening | ORAL | Status: DC | PRN

## 2023-12-06 MED ORDER — PANTOPRAZOLE SODIUM 40 MG IV SOLR
40.0000 mg | Freq: Two times a day (BID) | INTRAVENOUS | Status: DC
  Administered 2023-12-06 – 2023-12-08 (×4): 40 mg via INTRAVENOUS
  Filled 2023-12-06 (×4): qty 10

## 2023-12-06 MED ORDER — ACETAMINOPHEN 325 MG PO TABS
650.0000 mg | ORAL_TABLET | Freq: Four times a day (QID) | ORAL | Status: DC | PRN
  Administered 2023-12-07: 650 mg via ORAL
  Filled 2023-12-06: qty 2

## 2023-12-06 NOTE — ED Provider Notes (Signed)
 Cheryl Goodman EMERGENCY DEPARTMENT AT [facility] Provider Note   CSN: [id number] Arrival date & time: 12/06/23  1510     History  Chief Complaint  Patient presents with   Rectal Bleeding    Cheryl Goodman is a 83 y.o. female history of gastric ulcer, A-fib on Eliquis, here presenting with melena.  Patient states that she had some dark stools for about 4 days now.  Patient states that she recently finished antibiotics (omnicef) for pneumonia.  She states that she feels weak and dizzy and tired.  She went to GI office (Cheryl Goodman) and had blood drawn today and hemoglobin was 6.6.  Patient was sent in for admission and blood transfusion. Patient denies any abdominal pain.  Denies any vomiting.  The history is provided by the patient.       Home Medications Prior to Admission medications   Medication Sig Start Date End Date Taking? Authorizing Provider  acetaminophen (TYLENOL) 325 MG tablet Take 325-650 mg by mouth every 6 (six) hours as needed for headache or mild pain.    [provider]  acetaminophen-codeine (TYLENOL #3) 300-30 MG tablet Take 1 tablet by mouth every 4 (four) hours as needed for moderate pain. Patient not taking: Reported on 10/13/2023    [provider]  apixaban (ELIQUIS) 2.5 MG TABS tablet Take 2.5 mg by mouth 2 (two) times daily.    [provider]  Cholecalciferol (VITAMIN D3) 125 MCG (5000 UT) TABS Take 5,000 Units by mouth daily.    [provider]  HYDROcodone-acetaminophen (NORCO/VICODIN) 5-325 MG tablet Take 1 tablet by mouth every 6 (six) hours as needed. Patient taking differently: Take 1 tablet by mouth every 6 (six) hours as needed for moderate pain (pain score 4-6). 03/17/23   Cheryl Stabile, PA-C  pantoprazole (PROTONIX) 40 MG tablet Take 1 tablet (40 mg total) by mouth daily. 05/23/23 07/22/23  Cheryl Carrow, MD  polyethylene glycol (MIRALAX / GLYCOLAX) 17 g packet Take 17 g by mouth daily. 08/07/19    Cheryl Bong, MD  PROLIA 60 MG/ML SOSY injection Inject 60 mg into the skin every 6 (six) months.    [provider]  simvastatin (ZOCOR) 20 MG tablet Take 20 mg by mouth at bedtime.    [provider]  sucralfate (CARAFATE) 1 g tablet Take 1 g by mouth in the morning and at bedtime.    [provider]  traMADol (ULTRAM) 50 MG tablet Take 50 mg by mouth in the morning.    [provider]  triamterene-hydrochlorothiazide (DYAZIDE) 37.5-25 MG capsule Take 1 capsule by mouth daily. Patient not taking: Reported on 10/13/2023    [provider]      Allergies    Sulfa antibiotics, Dexlansoprazole, Ace inhibitors, Oxycodone, and Penicillins    Review of Systems   Review of Systems  Gastrointestinal:        Melena  All other systems reviewed and are negative.   Physical Exam Updated Vital Signs BP 118/62 (BP Location: Right Arm)   Pulse 91   Temp 98.1 F (36.7 C) (Oral)   Resp 17   Ht 4\' 10"  (1.473 m)   Wt 80.7 kg   SpO2 100%   BMI 37.18 kg/m  Physical Exam Vitals and nursing note reviewed.  Constitutional:      Comments: Pale and chronically ill  HENT:     Head: Normocephalic.     Nose: Nose normal.     Mouth/Throat:  Mouth: Mucous membranes are dry.  Eyes:     Comments: Conjunctiva is pale  Cardiovascular:     Rate and Rhythm: Normal rate and regular rhythm.     Pulses: Normal pulses.     Heart sounds: Normal heart sounds.  Pulmonary:     Effort: Pulmonary effort is normal.     Breath sounds: Normal breath sounds.  Abdominal:     General: Abdomen is flat.     Palpations: Abdomen is soft.  Musculoskeletal:        General: Normal range of motion.     Cervical back: Normal range of motion and neck supple.  Skin:    General: Skin is warm.     Capillary Refill: Capillary refill takes less than 2 seconds.  Neurological:     General: No focal deficit present.     Mental Status: She is alert and oriented to person,  place, and time.  Psychiatric:        Mood and Affect: Mood normal.        Behavior: Behavior normal.        Thought Content: Thought content normal.     ED Results / Procedures / Treatments   Labs (all labs ordered are listed, but only abnormal results are displayed) Labs Reviewed  COMPREHENSIVE METABOLIC PANEL  CBC WITH DIFFERENTIAL/PLATELET  POC OCCULT BLOOD, ED  TYPE AND SCREEN  PREPARE RBC (CROSSMATCH)    EKG None  Radiology No results found.  Procedures Procedures    CRITICAL CARE Performed by: Cheryl Goodman   Total critical care time: 30 minutes  Critical care time was exclusive of separately billable procedures and treating other patients.  Critical care was necessary to treat or prevent imminent or life-threatening deterioration.  Critical care was time spent personally by me on the following activities: development of treatment plan with patient and/or surrogate as well as nursing, discussions with consultants, evaluation of patient's response to treatment, examination of patient, obtaining history from patient or surrogate, ordering and performing treatments and interventions, ordering and review of laboratory studies, ordering and review of radiographic studies, pulse oximetry and re-evaluation of patient's condition.    Medications Ordered in ED Medications  pantoprazole (PROTONIX) injection 80 mg (has no administration in time range)  sodium chloride 0.9 % bolus 1,000 mL (has no administration in time range)  0.9 %  sodium chloride infusion (Manually program via Guardrails IV Fluids) (has no administration in time range)    ED Course/ Medical Decision Making/ A&P                                 Medical Decision Making [name] is a 83 y.o. female here presenting with melena and weakness.  Patient had an outpatient hemoglobin today that was 6.6. Patient appears pale.  Patient has been having melena for 4 days.  Concern for slow GI bleed  likely from gastric ulcer.  Will give Protonix and IV fluids.  Patient states that she has received blood before and consented for blood.  Will consult GI regarding endoscopy   5:31 PM Hemoglobin in the ED is 6.5.  Ordered 2 units of blood.  Also ordered Protonix.  I messaged Dr. Marca Ancona from GI to see patient tomorrow.  Patient will need admission for GI bleed likely from gastric ulcer.  Problems Addressed: Anemia, unspecified type: acute illness or injury Gastrointestinal hemorrhage with melena: acute illness or injury  Amount and/or Complexity of Data Reviewed Labs: ordered. Decision-making details documented in ED Course.  Risk Prescription drug management. Decision regarding hospitalization.   Final Clinical Impression(s) / ED Diagnoses Final diagnoses:  None    Rx / DC Orders ED Discharge Orders     None         Charlynne Pander, MD 12/06/23 1733

## 2023-12-06 NOTE — H&P (Signed)
 History and Physical    Cheryl Goodman YQM:[id number] DOB: [birth year] DOA: 12/06/2023  PCP: Danella Penton, MD Patient coming from: PCP/GI office  Chief Complaint: Fatigue  HPI: Cheryl Goodman is a 83 y.o. female with medical history significant of DVT/PE on Eliquis, CVA, GI bleed secondary to [facility] lesion, diverticulosis, iron deficiency anemia, HLD, HTN, CKD 3A comes to the hospital for evaluation of symptomatic anemia.  Patient reports of being diagnosed with influenza pneumonia last week and had been recovering well.  About 2 days ago started noticing dark stools and at first thought it was secondary to her recent pneumonia but earlier today started noticing some signs and symptoms of exertional dyspnea/symptomatic anemia.  She called her PCP and [facility] office.  Eventually ended up seeing Dr. Dulce Sellar from [facility] and the lab work noted to have hemoglobin of 6.6 therefore sent to the hospital.  In the ER she was FOBT positive.  BUN was noted to be 50, hemoglobin 6.5.  [facility] was notified.  Medical team was requested to admit the patient.  2 units PRBC transfusion was ordered by EDP.   Review of Systems: As per HPI otherwise 10 point review of systems negative.  Review of Systems Otherwise negative except as per HPI, including: General: Denies fever, chills, night sweats or unintended weight loss. Resp: Denies cough, wheezing, shortness of breath. Cardiac: Denies chest pain, palpitations, orthopnea, paroxysmal nocturnal dyspnea. GI: Denies abdominal pain, nausea, vomiting, diarrhea or constipation GU: Denies dysuria, frequency, hesitancy or incontinence MS: Denies muscle aches, joint pain or swelling Neuro: Denies headache, neurologic deficits (focal weakness, numbness, tingling), abnormal gait Psych: Denies anxiety, depression, SI/HI/AVH Skin: Denies new rashes or lesions ID: Denies sick contacts, exotic exposures, travel  Past Medical History:  Diagnosis Date   Acute  pulmonary embolism with acute cor pulmonale (HCC) 08/10/2019   Anemia    Arthritis    Dysphagia    GERD (gastroesophageal reflux disease)    History of hiatal hernia    History of kidney stones    x2 ; passed independently   Hyperlipidemia    Hypertension    Nausea    OA (osteoarthritis) of knee 10/31/2017   Sleep apnea    Stroke [facility])     Past Surgical History:  Procedure Laterality Date   ABDOMINAL HYSTERECTOMY     APPENDECTOMY     BIOPSY  12/28/2022   Procedure: BIOPSY;  Surgeon: Lynann Bologna, DO;  Location: [facility] ENDOSCOPY;  Service: Gastroenterology;;   BREAST BIOPSY Right    BREAST SURGERY     CARPAL TUNNEL RELEASE     CHOLECYSTECTOMY     COLONOSCOPY WITH PROPOFOL N/A 09/01/2015   Procedure: COLONOSCOPY WITH PROPOFOL;  Surgeon: Scot Jun, MD;  Location: [facility] ENDOSCOPY;  Service: Endoscopy;  Laterality: N/A;   COLONOSCOPY WITH PROPOFOL N/A 04/29/2021   Procedure: COLONOSCOPY WITH PROPOFOL;  Surgeon: Earline Mayotte, MD;  Location: [facility] ENDOSCOPY;  Service: Gastroenterology;  Laterality: N/A;   ESOPHAGOGASTRODUODENOSCOPY N/A 07/17/2019   Procedure: ESOPHAGOGASTRODUODENOSCOPY (EGD);  Surgeon: Pasty Spillers, MD;  Location: [facility] ENDOSCOPY;  Service: Endoscopy;  Laterality: N/A;   ESOPHAGOGASTRODUODENOSCOPY N/A 12/28/2022   Procedure: ESOPHAGOGASTRODUODENOSCOPY (EGD);  Surgeon: Lynann Bologna, DO;  Location: [facility] ENDOSCOPY;  Service: Gastroenterology;  Laterality: N/A;   ESOPHAGOGASTRODUODENOSCOPY (EGD) WITH PROPOFOL N/A 04/29/2021   Procedure: ESOPHAGOGASTRODUODENOSCOPY (EGD) WITH PROPOFOL;  Surgeon: Earline Mayotte, MD;  Location: [facility] ENDOSCOPY;  Service: Gastroenterology;  Laterality: N/A;   ESOPHAGOGASTRODUODENOSCOPY (EGD) WITH PROPOFOL  N/A 09/21/2022   Procedure: ESOPHAGOGASTRODUODENOSCOPY (EGD) WITH PROPOFOL;  Surgeon: Willis Modena, MD;  Location: [facility] ENDOSCOPY;  Service: Gastroenterology;  Laterality: N/A;   ESOPHAGOGASTRODUODENOSCOPY (EGD) WITH  PROPOFOL N/A 05/23/2023   Procedure: ESOPHAGOGASTRODUODENOSCOPY (EGD) WITH PROPOFOL;  Surgeon: Willis Modena, MD;  Location: [facility] ENDOSCOPY;  Service: Gastroenterology;  Laterality: N/A;   IR KYPHO LUMBAR INC FX REDUCE BONE BX UNI/BIL CANNULATION INC/IMAGING  10/19/2022   IR RADIOLOGIST EVAL & MGMT  11/04/2022   REDUCTION MAMMAPLASTY Bilateral    SAVORY DILATION N/A 09/01/2015   Procedure: SAVORY DILATION;  Surgeon: Scot Jun, MD;  Location: [facility] ENDOSCOPY;  Service: Endoscopy;  Laterality: N/A;   TOTAL KNEE ARTHROPLASTY Right 10/31/2017   Procedure: RIGHT TOTAL KNEE ARTHROPLASTY;  Surgeon: Ollen Gross, MD;  Location: [facility] ORS;  Service: Orthopedics;  Laterality: Right;    SOCIAL HISTORY:  reports that she has never smoked. She has never used smokeless tobacco. No history on file for alcohol use and drug use.  Allergies  Allergen Reactions   Sulfa Antibiotics Anaphylaxis and Swelling   Dexlansoprazole Other (See Comments)    Syncope    Ace Inhibitors Cough   Oxycodone Nausea Only   Penicillins Rash    Has patient had a PCN reaction causing immediate rash, facial/tongue/throat swelling, SOB or lightheadedness with hypotension: No Has patient had a PCN reaction causing severe rash involving mucus membranes or skin necrosis: No Has patient had a PCN reaction that required hospitalization: No Has patient had a PCN reaction occurring within the last 10 years: No If all of the above answers are "NO", then may proceed with Cephalosporin use.     FAMILY HISTORY: Family History  Problem Relation Age of Onset   Breast cancer Maternal Grandmother        in 87's   Heart disease Neg Hx      Prior to Admission medications   Medication Sig Start Date End Date Taking? Authorizing Provider  acetaminophen (TYLENOL) 325 MG tablet Take 325-650 mg by mouth every 6 (six) hours as needed for headache or mild pain.    [provider]  acetaminophen-codeine (TYLENOL #3) 300-30 MG tablet  Take 1 tablet by mouth every 4 (four) hours as needed for moderate pain. Patient not taking: Reported on 10/13/2023    [provider]  apixaban (ELIQUIS) 2.5 MG TABS tablet Take 2.5 mg by mouth 2 (two) times daily.    [provider]  Cholecalciferol (VITAMIN D3) 125 MCG (5000 UT) TABS Take 5,000 Units by mouth daily.    [provider]  HYDROcodone-acetaminophen (NORCO/VICODIN) 5-325 MG tablet Take 1 tablet by mouth every 6 (six) hours as needed. Patient taking differently: Take 1 tablet by mouth every 6 (six) hours as needed for moderate pain (pain score 4-6). 03/17/23   Mannie Stabile, PA-C  pantoprazole (PROTONIX) 40 MG tablet Take 1 tablet (40 mg total) by mouth daily. 05/23/23 07/22/23  Dorcas Carrow, MD  polyethylene glycol (MIRALAX / GLYCOLAX) 17 g packet Take 17 g by mouth daily. 08/07/19   Rodolph Bong, MD  PROLIA 60 MG/ML SOSY injection Inject 60 mg into the skin every 6 (six) months.    [provider]  simvastatin (ZOCOR) 20 MG tablet Take 20 mg by mouth at bedtime.    [provider]  sucralfate (CARAFATE) 1 g tablet Take 1 g by mouth in the morning and at bedtime.    [provider]  traMADol (ULTRAM) 50 MG tablet Take 50 mg by  mouth in the morning.    [provider]  triamterene-hydrochlorothiazide (DYAZIDE) 37.5-25 MG capsule Take 1 capsule by mouth daily. Patient not taking: Reported on 10/13/2023    [provider]    Physical Exam: Vitals:   12/06/23 1519 12/06/23 1520 12/06/23 1521  BP: 118/62    Pulse: 93 91   Resp: 17    Temp:  98.1 F (36.7 C)   TempSrc:  Oral   SpO2: 100% 100%   Weight:   80.7 kg  Height:   4\' 10"  (1.473 m)      Constitutional: NAD, calm, comfortable Eyes: PERRL, lids and conjunctivae normal ENMT: Mucous membranes are moist. Posterior pharynx clear of any exudate or lesions.Normal dentition.  Neck: normal, supple, no masses, no thyromegaly Respiratory: clear to  auscultation bilaterally, no wheezing, no crackles. Normal respiratory effort. No accessory muscle use.  Cardiovascular: Regular rate and rhythm, no murmurs / rubs / gallops. No extremity edema. 2+ pedal pulses. No carotid bruits.  Abdomen: no tenderness, no masses palpated. No hepatosplenomegaly. Bowel sounds positive.  Musculoskeletal: no clubbing / cyanosis. No joint deformity upper and lower extremities. Good ROM, no contractures. Normal muscle tone.  Skin: no rashes, lesions, ulcers. No induration Neurologic: CN 2-12 grossly intact. Sensation intact, DTR normal. Strength 5/5 in all 4.  Psychiatric: Normal judgment and insight. Alert and oriented x 3. Normal mood.    Body mass index is 37.18 kg/m.      Labs on Admission: I have personally reviewed following labs and imaging studies  CBC: Recent Labs  Lab 12/06/23 1626  WBC 14.9*  NEUTROABS 12.4*  HGB 6.5*  HCT 22.5*  MCV 101.8*  PLT 349   Basic Metabolic Panel: Recent Labs  Lab 12/06/23 1626  NA 138  K 4.5  CL 105  CO2 24  GLUCOSE 211*  BUN 50*  CREATININE 1.35*  CALCIUM 8.6*   GFR: Estimated Creatinine Clearance: 28.3 mL/min (A) (by C-G formula based on SCr of 1.35 mg/dL (H)). Liver Function Tests: Recent Labs  Lab 12/06/23 1626  AST 19  ALT 15  ALKPHOS 32*  BILITOT 0.7  PROT 5.8*  ALBUMIN 3.4*   No results for input(s): "LIPASE", "AMYLASE" in the last 168 hours. No results for input(s): "AMMONIA" in the last 168 hours. Coagulation Profile: No results for input(s): "INR", "PROTIME" in the last 168 hours. Cardiac Enzymes: No results for input(s): "CKTOTAL", "CKMB", "CKMBINDEX", "TROPONINI" in the last 168 hours. BNP (last 3 results) No results for input(s): "PROBNP" in the last 8760 hours. HbA1C: No results for input(s): "HGBA1C" in the last 72 hours. CBG: No results for input(s): "GLUCAP" in the last 168 hours. Lipid Profile: No results for input(s): "CHOL", "HDL", "LDLCALC", "TRIG", "CHOLHDL",  "LDLDIRECT" in the last 72 hours. Thyroid Function Tests: No results for input(s): "TSH", "T4TOTAL", "FREET4", "T3FREE", "THYROIDAB" in the last 72 hours. Anemia Panel: No results for input(s): "VITAMINB12", "FOLATE", "FERRITIN", "TIBC", "IRON", "RETICCTPCT" in the last 72 hours. Urine analysis:    Component Value Date/Time   COLORURINE YELLOW 09/18/2022 1632   APPEARANCEUR HAZY (A) 09/18/2022 1632   APPEARANCEUR Clear 02/17/2013 1708   LABSPEC 1.023 09/18/2022 1632   LABSPEC 1.004 02/17/2013 1708   PHURINE 5.0 09/18/2022 1632   GLUCOSEU NEGATIVE 09/18/2022 1632   GLUCOSEU Negative 02/17/2013 1708   HGBUR NEGATIVE 09/18/2022 1632   BILIRUBINUR NEGATIVE 09/18/2022 1632   BILIRUBINUR Negative 02/17/2013 1708   KETONESUR NEGATIVE 09/18/2022 1632   PROTEINUR NEGATIVE 09/18/2022 1632   NITRITE NEGATIVE  09/18/2022 1632   LEUKOCYTESUR MODERATE (A) 09/18/2022 1632   LEUKOCYTESUR 1+ 02/17/2013 1708   Sepsis Labs: !!!!!!!!!!!!!!!!!!!!!!!!!!!!!!!!!!!!!!!!!!!! @LABRCNTIP (procalcitonin:4,lacticidven:4) )No results found for this or any previous visit (from the past 240 hours).   Radiological Exams on Admission: No results found.  Nutritional status  All images have been reviewed by me personally.    Assessment/Plan Principal Problem:   Melena Active Problems:   Acute upper GI bleed   Essential hypertension   Gastroesophageal reflux disease   History of ischemic stroke   Osteoporosis   Chronic kidney disease, stage 3a (HCC)   History of pulmonary embolism    Acute upper GI bleed with melanotic stool Symptomatic anemia - Admission hemoglobin 6.6, 2 units PRBC ordered by EDP.  Baseline hemoglobin 10.  BUN is also elevated at this time.  Will place her on clear liquid diet, n.p.o. past midnight.  PPI IV twice daily.  [facility] has been consulted.  IV fluids. - Reactive leukocytosis.  Continue to monitor  EGD August 2000 24-10 cm hiatal hernia, gastritis, gastric polyp  History  of CVA History of PE/DVT - Eliquis is currently on hold secondary to GI bleed evaluation.  Hyperlipidemia - Hold statin  CKD stage IIIa - Creatinine around baseline 1.3.  Closely monitor  Osteoarthritis - Pain control.  Tylenol as needed  Essential hypertension - Holding home diuretics.  On IV as needed medications.  Vitamin D deficiency - Supplements on hold    DVT prophylaxis: SCDs Code Status: Full code Family Communication: None Consults called: [facility] consulted Admission status: MedSurg, hemodynamically stable  Status is: Inpatient Remains inpatient appropriate because: Admit for GI evaluation.  Likely will need endoscopic evaluation   Time Spent: 65 minutes.  >50% of the time was devoted to discussing the patients care, assessment, plan and disposition with other care givers along with counseling the patient about the risks and benefits of treatment.    Miguel Rota MD [facility]  If 7PM-7AM, please contact night-coverage   12/06/2023, 6:09 PM

## 2023-12-06 NOTE — ED Provider Triage Note (Signed)
 Emergency Medicine Provider Triage Evaluation Note  Cheryl Goodman , a 83 y.o. female  was evaluated in triage.  Pt complains of rectal bleeding.  Patient reports no hospitalizations in her stools.  She denies any significant constipation.  Denies any bright red blood per rectum.  She is currently on Eliquis.  Feels generalized weakness but denies any syncope.  No significant abdominal pain.  Denies any vomiting, hematemesis, or hematochezia.  Review of Systems  Positive: As above Negative: As above  Physical Exam  BP 118/62 (BP Location: Right Arm)   Pulse 91   Temp 98.1 F (36.7 C) (Oral)   Resp 17   Ht 4\' 10"  (1.473 m)   Wt 80.7 kg   SpO2 100%   BMI 37.18 kg/m  Gen:   Awake, no distress   Resp:  Normal effort  MSK:   Moves extremities without difficulty  Other:    Medical Decision Making  Medically screening exam initiated at 3:35 PM.  Appropriate orders placed.  Cheryl Goodman was informed that the remainder of the evaluation will be completed by another provider, this initial triage assessment does not replace that evaluation, and the importance of remaining in the ED until their evaluation is complete.     Smitty Knudsen, PA-C 12/06/23 1537

## 2023-12-06 NOTE — ED Notes (Signed)
 ED TO INPATIENT HANDOFF REPORT  Name/Age/Gender Cheryl Goodman 83 y.o. female  Code Status    Code Status Orders  (From admission, onward)           Start     Ordered   12/06/23 1804  Full code  Continuous       Question:  By:  Answer:  Consent: discussion documented in EHR   12/06/23 1805           Code Status History     Date Active Date Inactive Code Status Order ID Comments User Context   05/21/2023 1939 05/23/2023 2128 Full Code [id number]  Charlsie Quest, MD ED   12/27/2022 1446 12/29/2022 1853 Full Code [id number]  Willeen Niece, MD ED   09/18/2022 1654 09/26/2022 1630 Full Code [id number]  Marinda Elk, MD ED   08/01/2019 2132 08/06/2019 1928 Full Code [id number]  Charlsie Quest, MD ED   07/16/2019 2001 07/18/2019 1426 Full Code [id number]  Milagros Loll, MD ED   10/31/2017 1645 11/02/2017 1845 Full Code [id number]  Ollen Gross, MD Inpatient       Home/SNF/Other Home  Chief Complaint Melena [K92.1]  Level of Care/Admitting Diagnosis ED Disposition     ED Disposition  Admit   Condition  --   Comment  Hospital Area: [facility] [[id number]]  Level of Care: Med-Surg [16]  May admit patient to [facility] or [facility] if equivalent level of care is available:: Yes  Covid Evaluation: Confirmed COVID Negative  Diagnosis: Melena [[id number]]  Admitting Physician: Miguel Rota [[id number]]  Attending Physician: Miguel Rota [phone]  Certification:: I certify this patient will need inpatient services for at least 2 midnights  Expected Medical Readiness: 12/08/2023          Medical History Past Medical History:  Diagnosis Date   Acute pulmonary embolism with acute cor pulmonale (HCC) 08/10/2019   Anemia    Arthritis    Dysphagia    GERD (gastroesophageal reflux disease)    History of hiatal hernia    History of kidney stones    x2 ; passed independently   Hyperlipidemia    Hypertension    Nausea    OA (osteoarthritis) of  knee 10/31/2017   Sleep apnea    Stroke (HCC)     Allergies Allergies  Allergen Reactions   Sulfa Antibiotics Anaphylaxis and Swelling   Dexlansoprazole Other (See Comments)    Syncope    Ace Inhibitors Cough   Oxycodone Nausea Only   Penicillins Rash    Has patient had a PCN reaction causing immediate rash, facial/tongue/throat swelling, SOB or lightheadedness with hypotension: No Has patient had a PCN reaction causing severe rash involving mucus membranes or skin necrosis: No Has patient had a PCN reaction that required hospitalization: No Has patient had a PCN reaction occurring within the last 10 years: No If all of the above answers are "NO", then may proceed with Cephalosporin use.     IV Location/Drains/Wounds Patient Lines/Drains/Airways Status     Active Line/Drains/Airways     Name Placement date Placement time Site Days   Peripheral IV 12/06/23 20 G Anterior;Proximal;Right Forearm 12/06/23  1714  Forearm  less than 1   Peripheral IV 12/06/23 20 G 1.88" Anterior;Right Forearm 12/06/23  2140  Forearm  less than 1            Labs/Imaging Results for orders placed or performed during the hospital encounter of 12/06/23 (from the past 48  hours)  Comprehensive metabolic panel     Status: Abnormal   Collection Time: 12/06/23  4:26 PM  Result Value Ref Range   Sodium 138 135 - 145 mmol/L   Potassium 4.5 3.5 - 5.1 mmol/L   Chloride 105 98 - 111 mmol/L   CO2 24 22 - 32 mmol/L   Glucose, Bld 211 (H) 70 - 99 mg/dL    Comment: Glucose reference range applies only to samples taken after fasting for at least 8 hours.   BUN 50 (H) 8 - 23 mg/dL   Creatinine, Ser 1.61 (H) 0.44 - 1.00 mg/dL   Calcium 8.6 (L) 8.9 - 10.3 mg/dL   Total Protein 5.8 (L) 6.5 - 8.1 g/dL   Albumin 3.4 (L) 3.5 - 5.0 g/dL   AST 19 15 - 41 U/L   ALT 15 0 - 44 U/L   Alkaline Phosphatase 32 (L) 38 - 126 U/L   Total Bilirubin 0.7 0.0 - 1.2 mg/dL   GFR, Estimated 39 (L) >60 mL/min    Comment:  (NOTE) Calculated using the CKD-EPI Creatinine Equation (2021)    Anion gap 9 5 - 15    Comment: Performed at [facility], [address] [place], [place] [address]  Type and screen [facility]     Status: None (Preliminary result)   Collection Time: 12/06/23  4:26 PM  Result Value Ref Range   ABO/RH(D) B NEG    Antibody Screen NEG    Sample Expiration 12/09/2023,2359    Unit Number [id number]    Blood Component Type RED CELLS,LR    Unit division 00    Status of Unit ALLOCATED    Transfusion Status OK TO TRANSFUSE    Crossmatch Result Compatible    Unit Number [id number]    Blood Component Type RED CELLS,LR    Unit division 00    Status of Unit ISSUED    Transfusion Status OK TO TRANSFUSE    Crossmatch Result      Compatible Performed at [facility], [address], [place], [place] [address]   CBC with Differential     Status: Abnormal   Collection Time: 12/06/23  4:26 PM  Result Value Ref Range   WBC 14.9 (H) 4.0 - 10.5 K/uL   RBC 2.21 (L) 3.87 - 5.11 MIL/uL   Hemoglobin 6.5 (LL) 12.0 - 15.0 g/dL    Comment: REPEATED TO VERIFY THIS CRITICAL RESULT HAS VERIFIED AND BEEN CALLED TO M. BOWEN, RN BY JENNIFER COLE ON 03 04 2025 AT 1657, AND HAS BEEN READ BACK.     HCT 22.5 (L) 36.0 - 46.0 %   MCV 101.8 (H) 80.0 - 100.0 fL   MCH 29.4 26.0 - 34.0 pg   MCHC 28.9 (L) 30.0 - 36.0 g/dL   RDW 52.8 (H) 41.3 - 24.4 %   Platelets 349 150 - 400 K/uL   nRBC 0.5 (H) 0.0 - 0.2 %   Neutrophils Relative % 83 %   Neutro Abs 12.4 (H) 1.7 - 7.7 K/uL   Lymphocytes Relative 11 %   Lymphs Abs 1.7 0.7 - 4.0 K/uL   Monocytes Relative 4 %   Monocytes Absolute 0.6 0.1 - 1.0 K/uL   Eosinophils Relative 0 %   Eosinophils Absolute 0.0 0.0 - 0.5 K/uL   Basophils Relative 0 %   Basophils Absolute 0.0 0.0 - 0.1 K/uL   Immature Granulocytes 2 %   Abs Immature Granulocytes 0.24 (H) 0.00 - 0.07  K/uL    Comment: Performed at [facility], [address], [place], [place] [address]  Prepare RBC (crossmatch)     Status: None   Collection Time: 12/06/23  4:26 PM  Result Value Ref Range   Order Confirmation      ORDER PROCESSED BY BLOOD BANK Performed at [facility], [address], [place], [place] [address]   POC occult blood, ED     Status: Abnormal   Collection Time: 12/06/23  4:41 PM  Result Value Ref Range   Fecal Occult Bld POSITIVE (A) NEGATIVE  Ferritin     Status: None   Collection Time: 12/06/23  7:35 PM  Result Value Ref Range   Ferritin 84 11 - 307 ng/mL    Comment: Performed at [facility], [address], [place], [place] [address]  Iron and TIBC     Status: None   Collection Time: 12/06/23  7:35 PM  Result Value Ref Range   Iron 60 28 - 170 ug/dL   TIBC 213 086 - 578 ug/dL   Saturation Ratios 19 10.4 - 31.8 %   UIBC 249 ug/dL    Comment: Performed at [facility], [address] [place], [place] [address]  Vitamin B12     Status: Abnormal   Collection Time: 12/06/23  7:35 PM  Result Value Ref Range   Vitamin B-12 2,873 (H) 180 - 914 pg/mL    Comment: RESULT CONFIRMED BY MANUAL DILUTION (NOTE) This assay is not validated for testing neonatal or myeloproliferative syndrome specimens for Vitamin B12 levels. Performed at [facility], [address] [place], [place] [address]    DG Chest Portable 1 View Result Date: 12/06/2023 CLINICAL DATA:  Shortness of breath and fatigue. EXAM: PORTABLE CHEST 1 VIEW COMPARISON:  August 05, 2019 FINDINGS: The cardiac silhouette is mildly enlarged and unchanged in size. Moderate to marked severity calcification of the aortic arch is seen. There is very mild atelectasis is noted along the periphery of the left lung base. No pleural effusion or pneumothorax is identified. There is a large, stable hiatal hernia. Multilevel degenerative changes are noted throughout the  thoracic spine. IMPRESSION: 1. Very mild left basilar atelectasis. 2. Large, stable hiatal hernia. Electronically Signed   By: Aram Candela M.D.   On: 12/06/2023 18:27    Pending Labs Unresulted Labs (From admission, onward)     Start     Ordered   12/07/23 0500  Basic metabolic panel  Daily,   R      12/06/23 1805   12/07/23 0500  CBC  Daily,   R      12/06/23 1805   12/07/23 0500  Magnesium  Daily,   R      12/06/23 1805   12/07/23 0500  Phosphorus  Tomorrow morning,   R        12/06/23 1805            Vitals/Pain Today's Vitals   12/06/23 2000 12/06/23 2100 12/06/23 2121 12/06/23 2148  BP: (!) 115/53 (!) 86/74 (!) 122/91 127/66  Pulse: 81 80 80 79  Resp: 20 20  19   Temp:   98.6 F (37 C) 98.1 F (36.7 C)  TempSrc:   Oral Oral  SpO2: 100% 99% 100% 100%  Weight:      Height:      PainSc:   0-No pain     Isolation Precautions No active isolations  Medications  Medications  0.9 %  sodium chloride infusion (Manually program via Guardrails IV Fluids) (has no administration in time range)  pantoprazole (PROTONIX) injection 40 mg (40 mg Intravenous Given 12/06/23 2205)  acetaminophen (TYLENOL) tablet 650 mg (has no administration in time range)    Or  acetaminophen (TYLENOL) suppository 650 mg (has no administration in time range)  oxyCODONE (Oxy IR/ROXICODONE) immediate release tablet 5 mg (has no administration in time range)  HYDROmorphone (DILAUDID) injection 0.5-1 mg (has no administration in time range)  senna-docusate (Senokot-S) tablet 1 tablet (has no administration in time range)  bisacodyl (DULCOLAX) EC tablet 5 mg (has no administration in time range)  sodium phosphate (FLEET) enema 1 enema (has no administration in time range)  ondansetron (ZOFRAN) tablet 4 mg (has no administration in time range)    Or  ondansetron (ZOFRAN) injection 4 mg (has no administration in time range)  guaiFENesin (MUCINEX) 12 hr tablet 600 mg (has no administration in time  range)  nicotine (NICODERM CQ - dosed in mg/24 hours) patch 21 mg (has no administration in time range)  metoprolol tartrate (LOPRESSOR) injection 10 mg (has no administration in time range)  dextrose 5 % and 0.45 % NaCl infusion ( Intravenous New Bag/Given 12/06/23 2156)  ipratropium-albuterol (DUONEB) 0.5-2.5 (3) MG/3ML nebulizer solution 3 mL (has no administration in time range)  hydrALAZINE (APRESOLINE) injection 10 mg (has no administration in time range)  traZODone (DESYREL) tablet 50 mg (has no administration in time range)  pantoprazole (PROTONIX) injection 80 mg (80 mg Intravenous Given 12/06/23 1719)  sodium chloride 0.9 % bolus 1,000 mL (0 mLs Intravenous Stopped 12/06/23 1932)    Mobility walks

## 2023-12-06 NOTE — ED Triage Notes (Signed)
 Pt reports having black stools since Sunday and today at her primary appt, he told her that her hgb was 6.4. Pt has been having shob and fatigue for weeks.

## 2023-12-07 ENCOUNTER — Encounter (HOSPITAL_COMMUNITY)
Admission: EM | Disposition: A | Discharge status: Home / Self Care | Payer: Self-pay | Source: Ambulatory Visit | Attending: Internal Medicine

## 2023-12-07 ENCOUNTER — Encounter (HOSPITAL_COMMUNITY): Payer: Self-pay | Admitting: Internal Medicine

## 2023-12-07 DIAGNOSIS — K921 Melena: Secondary | ICD-10-CM | POA: Diagnosis not present

## 2023-12-07 HISTORY — PX: GIVENS CAPSULE STUDY: SHX5432

## 2023-12-07 LAB — CBC
HCT: 27.4 % — ABNORMAL LOW (ref 36.0–46.0)
Hemoglobin: 8.4 g/dL — ABNORMAL LOW (ref 12.0–15.0)
MCH: 29.7 pg (ref 26.0–34.0)
MCHC: 30.7 g/dL (ref 30.0–36.0)
MCV: 96.8 fL (ref 80.0–100.0)
Platelets: 262 10*3/uL (ref 150–400)
RBC: 2.83 MIL/uL — ABNORMAL LOW (ref 3.87–5.11)
RDW: 18.7 % — ABNORMAL HIGH (ref 11.5–15.5)
WBC: 12.4 10*3/uL — ABNORMAL HIGH (ref 4.0–10.5)
nRBC: 0.3 % — ABNORMAL HIGH (ref 0.0–0.2)

## 2023-12-07 LAB — BASIC METABOLIC PANEL
Anion gap: 7 (ref 5–15)
BUN: 37 mg/dL — ABNORMAL HIGH (ref 8–23)
CO2: 24 mmol/L (ref 22–32)
Calcium: 7.9 mg/dL — ABNORMAL LOW (ref 8.9–10.3)
Chloride: 107 mmol/L (ref 98–111)
Creatinine, Ser: 1.18 mg/dL — ABNORMAL HIGH (ref 0.44–1.00)
GFR, Estimated: 46 mL/min — ABNORMAL LOW (ref >60)
Glucose, Bld: 103 mg/dL — ABNORMAL HIGH (ref 70–99)
Potassium: 4.2 mmol/L (ref 3.5–5.1)
Sodium: 138 mmol/L (ref 135–145)

## 2023-12-07 LAB — MAGNESIUM: Magnesium: 2.4 mg/dL (ref 1.7–2.4)

## 2023-12-07 LAB — PHOSPHORUS: Phosphorus: 3.4 mg/dL (ref 2.5–4.6)

## 2023-12-07 SURGERY — IMAGING PROCEDURE, GI TRACT, INTRALUMINAL, VIA CAPSULE
Anesthesia: LOCAL

## 2023-12-07 MED ORDER — FERROUS SULFATE 325 (65 FE) MG PO TABS
325.0000 mg | ORAL_TABLET | Freq: Every day | ORAL | Status: DC
  Administered 2023-12-07 – 2023-12-10 (×4): 325 mg via ORAL
  Filled 2023-12-07 (×4): qty 1

## 2023-12-07 MED ORDER — DOCUSATE SODIUM 100 MG PO CAPS
100.0000 mg | ORAL_CAPSULE | Freq: Two times a day (BID) | ORAL | Status: DC
  Administered 2023-12-07 – 2023-12-10 (×2): 100 mg via ORAL
  Filled 2023-12-07 (×5): qty 1

## 2023-12-07 SURGICAL SUPPLY — 1 items: TOWEL COTTON PACK 4EA (MISCELLANEOUS) ×4 IMPLANT

## 2023-12-07 NOTE — Progress Notes (Signed)
 PROGRESS NOTE    Cheryl Goodman  ZOX:[id number] DOB: 07/02/1941 DOA: 12/06/2023 PCP: Danella Penton, MD    Brief Narrative:  83 y.o. female with medical history significant of DVT/PE on Eliquis, CVA, GI bleed secondary to [facility] lesion, diverticulosis, iron deficiency anemia, HLD, HTN, CKD 3A comes to the hospital for evaluation of symptomatic anemia.    Assessment & Plan:  Principal Problem:   Melena Active Problems:   Acute upper GI bleed   Essential hypertension   Gastroesophageal reflux disease   History of ischemic stroke   Osteoporosis   Chronic kidney disease, stage 3a (HCC)   History of pulmonary embolism    Acute upper GI bleed with melanotic stool Symptomatic anemia - Admission hemoglobin 6.6, 2 units PRBC ordered by EDP.  Baseline hemoglobin 10.  Continue PPI IV twice daily.  [facility] consulted. - Rare leukocytosis improving - Ferritin borderline low therefore start iron supplements with bowel regimen   EGD August 2000 24-10 cm hiatal hernia, gastritis, gastric polyp   History of CVA History of PE/DVT - Eliquis is currently on hold secondary to GI bleed evaluation.   Hyperlipidemia - Hold statin   CKD stage IIIa - Creatinine around baseline 1.3.  Closely monitor   Osteoarthritis - Pain control.  Tylenol as needed   Essential hypertension - Holding home diuretics.  On IV as needed medications.   Vitamin D deficiency - Supplements on hold       DVT prophylaxis: SCDs Start: 12/06/23 1804    Code Status: Full Code Family Communication:   Status is: Inpatient Remains inpatient appropriate because: Ongoing GI evaluation for symptomatic anemia and dark stools.    Subjective: Seen at bedside, tells me she feels slightly better today after getting PRBC transfusion.   Examination:  General exam: Appears calm and comfortable  Respiratory system: Clear to auscultation. Respiratory effort normal. Cardiovascular system: S1 & S2 heard, RRR. No  JVD, murmurs, rubs, gallops or clicks. No pedal edema. Gastrointestinal system: Abdomen is nondistended, soft and nontender. No organomegaly or masses felt. Normal bowel sounds heard. Central nervous system: Alert and oriented. No focal neurological deficits. Extremities: Symmetric 5 x 5 power. Skin: No rashes, lesions or ulcers Psychiatry: Judgement and insight appear normal. Mood & affect appropriate.                Diet Orders (From admission, onward)     Start     Ordered   12/07/23 0748  Diet NPO time specified  Diet effective now        12/07/23 0747            Objective: Vitals:   12/07/23 0036 12/07/23 0237 12/07/23 0516 12/07/23 1002  BP: (!) 113/59 129/74 125/73 130/65  Pulse: 72 73 72 71  Resp: 18 18 18 16   Temp: 97.8 F (36.6 C) (!) 97.3 F (36.3 C) 98.1 F (36.7 C) 98.4 F (36.9 C)  TempSrc: Oral Oral Oral Oral  SpO2: 98% 99% 100% 98%  Weight:      Height:        Intake/Output Summary (Last 24 hours) at 12/07/2023 1058 Last data filed at 12/07/2023 1000 Gross per 24 hour  Intake 2322.31 ml  Output --  Net 2322.31 ml   Filed Weights   12/06/23 1521  Weight: 80.7 kg    Scheduled Meds:  docusate sodium  100 mg Oral BID   ferrous sulfate  325 mg Oral Q breakfast   pantoprazole (PROTONIX) IV  40  mg Intravenous Q12H   Continuous Infusions:  dextrose 5 % and 0.45 % NaCl 40 mL/hr at 12/06/23 2156    Nutritional status     Body mass index is 37.18 kg/m.  Data Reviewed:   CBC: Recent Labs  Lab 12/06/23 1626 12/07/23 0503  WBC 14.9* 12.4*  NEUTROABS 12.4*  --   HGB 6.5* 8.4*  HCT 22.5* 27.4*  MCV 101.8* 96.8  PLT 349 262   Basic Metabolic Panel: Recent Labs  Lab 12/06/23 1626 12/07/23 0503  NA 138 138  K 4.5 4.2  CL 105 107  CO2 24 24  GLUCOSE 211* 103*  BUN 50* 37*  CREATININE 1.35* 1.18*  CALCIUM 8.6* 7.9*  MG  --  2.4  PHOS  --  3.4   GFR: Estimated Creatinine Clearance: 32.4 mL/min (A) (by C-G formula based on  SCr of 1.18 mg/dL (H)). Liver Function Tests: Recent Labs  Lab 12/06/23 1626  AST 19  ALT 15  ALKPHOS 32*  BILITOT 0.7  PROT 5.8*  ALBUMIN 3.4*   No results for input(s): "LIPASE", "AMYLASE" in the last 168 hours. No results for input(s): "AMMONIA" in the last 168 hours. Coagulation Profile: No results for input(s): "INR", "PROTIME" in the last 168 hours. Cardiac Enzymes: No results for input(s): "CKTOTAL", "CKMB", "CKMBINDEX", "TROPONINI" in the last 168 hours. BNP (last 3 results) No results for input(s): "PROBNP" in the last 8760 hours. HbA1C: No results for input(s): "HGBA1C" in the last 72 hours. CBG: No results for input(s): "GLUCAP" in the last 168 hours. Lipid Profile: No results for input(s): "CHOL", "HDL", "LDLCALC", "TRIG", "CHOLHDL", "LDLDIRECT" in the last 72 hours. Thyroid Function Tests: No results for input(s): "TSH", "T4TOTAL", "FREET4", "T3FREE", "THYROIDAB" in the last 72 hours. Anemia Panel: Recent Labs    12/06/23 1935  VITAMINB12 2,873*  FERRITIN 84  TIBC 309  IRON 60   Sepsis Labs: No results for input(s): "PROCALCITON", "LATICACIDVEN" in the last 168 hours.  No results found for this or any previous visit (from the past 240 hours).       Radiology Studies: DG Chest Portable 1 View Result Date: 12/06/2023 CLINICAL DATA:  Shortness of breath and fatigue. EXAM: PORTABLE CHEST 1 VIEW COMPARISON:  August 05, 2019 FINDINGS: The cardiac silhouette is mildly enlarged and unchanged in size. Moderate to marked severity calcification of the aortic arch is seen. There is very mild atelectasis is noted along the periphery of the left lung base. No pleural effusion or pneumothorax is identified. There is a large, stable hiatal hernia. Multilevel degenerative changes are noted throughout the thoracic spine. IMPRESSION: 1. Very mild left basilar atelectasis. 2. Large, stable hiatal hernia. Electronically Signed   By: Aram Candela M.D.   On: 12/06/2023  18:27           LOS: 1 day   Time spent= 35 mins    Miguel Rota, MD [facility]  If 7PM-7AM, please contact night-coverage  12/07/2023, 10:58 AM

## 2023-12-07 NOTE — Plan of Care (Signed)

## 2023-12-07 NOTE — Consult Note (Signed)
 [facility]enterology Consult  Referring Provider: No ref. provider found Primary Care Physician:  Danella Penton, MD Primary Gastroenterologist: [facility]. Dulce Sellar  Reason for Consultation: Melena, anemia  SUBJECTIVE:   HPI: Cheryl Goodman is a 83 y.o. female with past medical history significant for DVT/PE on Eliquis (last dose 12/06/2023), CVA, iron deficiency anemia, hypertension, chronic kidney disease.  Noted that she had influenza and pneumonia over the past few weeks, has had significant cough and shortness of breath, initially improved.  She began seeing melena on 12/03/2023.  She began to have worsening shortness of breath.  Had blood work completed through [facility] and was recommended to present to the hospital given anemia.  Labs on presentation showed hemoglobin 6.5 (now 8.4 status post 2 unit PRBC), baseline 10.2 on 1/9/thousand 25, iron studies within normal limits, WBC 12.4, platelet 262.  Chest x-ray showed large stable hiatal hernia.  EGD 12/28/2022 for melena and anemia (Dr. Lorenso Quarry) showed 10 cm hiatal hernia, antrum erythema, duodenum within normal limits.  Video capsule endoscopy 02/02/2023 showed no signs of active bleeding in the small intestine, cecum was entered at 4 hours 17 minutes and at 5 hours there appeared to be an area of erythema/possible blood.  EGD 05/23/2023 for melena and anemia (Dr. Dulce Sellar) showed 10 cm hiatal hernia, patchy inflammation of the stomach.   Colonoscopy 08/30/2023 for abnormal CT scan (Dr. Dulce Sellar) showed internal hemorrhoids, diverticulosis in the ascending, transverse, descending and sigmoid colon.  Past Medical History:  Diagnosis Date   Acute pulmonary embolism with acute cor pulmonale (HCC) 08/10/2019   Anemia    Arthritis    Dysphagia    GERD (gastroesophageal reflux disease)    History of hiatal hernia    History of kidney stones    x2 ; passed independently   Hyperlipidemia    Hypertension    Nausea    OA (osteoarthritis) of  knee 10/31/2017   Sleep apnea    Stroke [facility])    Past Surgical History:  Procedure Laterality Date   ABDOMINAL HYSTERECTOMY     APPENDECTOMY     BIOPSY  12/28/2022   Procedure: BIOPSY;  Surgeon: Lynann Bologna, DO;  Location: [facility] ENDOSCOPY;  Service: Gastroenterology;;   BREAST BIOPSY Right    BREAST SURGERY     CARPAL TUNNEL RELEASE     CHOLECYSTECTOMY     COLONOSCOPY WITH PROPOFOL N/A 09/01/2015   Procedure: COLONOSCOPY WITH PROPOFOL;  Surgeon: Scot Jun, MD;  Location: [facility] ENDOSCOPY;  Service: Endoscopy;  Laterality: N/A;   COLONOSCOPY WITH PROPOFOL N/A 04/29/2021   Procedure: COLONOSCOPY WITH PROPOFOL;  Surgeon: Earline Mayotte, MD;  Location: [facility] ENDOSCOPY;  Service: Gastroenterology;  Laterality: N/A;   ESOPHAGOGASTRODUODENOSCOPY N/A 07/17/2019   Procedure: ESOPHAGOGASTRODUODENOSCOPY (EGD);  Surgeon: Pasty Spillers, MD;  Location: [facility] ENDOSCOPY;  Service: Endoscopy;  Laterality: N/A;   ESOPHAGOGASTRODUODENOSCOPY N/A 12/28/2022   Procedure: ESOPHAGOGASTRODUODENOSCOPY (EGD);  Surgeon: Lynann Bologna, DO;  Location: [facility] ENDOSCOPY;  Service: Gastroenterology;  Laterality: N/A;   ESOPHAGOGASTRODUODENOSCOPY (EGD) WITH PROPOFOL N/A 04/29/2021   Procedure: ESOPHAGOGASTRODUODENOSCOPY (EGD) WITH PROPOFOL;  Surgeon: Earline Mayotte, MD;  Location: [facility] ENDOSCOPY;  Service: Gastroenterology;  Laterality: N/A;   ESOPHAGOGASTRODUODENOSCOPY (EGD) WITH PROPOFOL N/A 09/21/2022   Procedure: ESOPHAGOGASTRODUODENOSCOPY (EGD) WITH PROPOFOL;  Surgeon: Willis Modena, MD;  Location: [facility] ENDOSCOPY;  Service: Gastroenterology;  Laterality: N/A;   ESOPHAGOGASTRODUODENOSCOPY (EGD) WITH PROPOFOL N/A 05/23/2023   Procedure: ESOPHAGOGASTRODUODENOSCOPY (EGD) WITH PROPOFOL;  Surgeon: Willis Modena, MD;  Location: [facility] ENDOSCOPY;  Service: Gastroenterology;  Laterality: N/A;   IR KYPHO LUMBAR INC FX REDUCE BONE BX UNI/BIL CANNULATION INC/IMAGING  10/19/2022   IR RADIOLOGIST EVAL & MGMT   11/04/2022   REDUCTION MAMMAPLASTY Bilateral    SAVORY DILATION N/A 09/01/2015   Procedure: SAVORY DILATION;  Surgeon: Scot Jun, MD;  Location: [facility] ENDOSCOPY;  Service: Endoscopy;  Laterality: N/A;   TOTAL KNEE ARTHROPLASTY Right 10/31/2017   Procedure: RIGHT TOTAL KNEE ARTHROPLASTY;  Surgeon: Ollen Gross, MD;  Location: [facility] ORS;  Service: Orthopedics;  Laterality: Right;   Prior to Admission medications   Medication Sig Start Date End Date Taking? Authorizing Provider  acetaminophen (TYLENOL) 325 MG tablet Take 325-650 mg by mouth every 6 (six) hours as needed for headache or mild pain.   Yes [provider]  apixaban (ELIQUIS) 2.5 MG TABS tablet Take 2.5 mg by mouth 2 (two) times daily.   Yes [provider]  Cholecalciferol (VITAMIN D3) 125 MCG (5000 UT) TABS Take 5,000 Units by mouth daily.   Yes [provider]  predniSONE (DELTASONE) 10 MG tablet Take 10 mg by mouth daily. 11/25/23  Yes [provider]  PROLIA 60 MG/ML SOSY injection Inject 60 mg into the skin every 6 (six) months.   Yes [provider]  sucralfate (CARAFATE) 1 g tablet Take 1 g by mouth in the morning and at bedtime.   Yes [provider]  traMADol (ULTRAM) 50 MG tablet Take 50 mg by mouth 2 (two) times daily.   Yes [provider]  cefdinir (OMNICEF) 300 MG capsule Take 300 mg by mouth 2 (two) times daily. Patient not taking: Reported on 12/06/2023    [provider]  pantoprazole (PROTONIX) 40 MG tablet Take 1 tablet (40 mg total) by mouth daily. Patient not taking: Reported on 12/06/2023 05/23/23 07/22/23  Dorcas Carrow, MD  propranolol (INDERAL) 20 MG tablet Take 20 mg by mouth 2 (two) times daily. Patient not taking: Reported on 12/06/2023 09/19/23   [provider]  simvastatin (ZOCOR) 20 MG tablet Take 20 mg by mouth at bedtime. Patient not taking: Reported on 12/06/2023    [provider]  tretinoin (RETIN-A) 0.05 % cream  Apply 1 Application topically at bedtime as needed. Patient not taking: Reported on 12/06/2023 08/25/23   [provider]  triamterene-hydrochlorothiazide (DYAZIDE) 37.5-25 MG capsule Take 1 capsule by mouth daily. Patient not taking: Reported on 10/13/2023    [provider]   Current Facility-Administered Medications  Medication Dose Route Frequency Provider Last Rate Last Admin   acetaminophen (TYLENOL) tablet 650 mg  650 mg Oral Q6H PRN Amin, Ankit C, MD       Or   acetaminophen (TYLENOL) suppository 650 mg  650 mg Rectal Q6H PRN Amin, Ankit C, MD       bisacodyl (DULCOLAX) EC tablet 5 mg  5 mg Oral Daily PRN Amin, Ankit C, MD       dextrose 5 % and 0.45 % NaCl infusion   Intravenous Continuous Amin, Ankit C, MD 40 mL/hr at 12/06/23 2156 New Bag at 12/06/23 2156   docusate sodium (COLACE) capsule 100 mg  100 mg Oral BID Amin, Ankit C, MD       ferrous sulfate tablet 325 mg  325 mg Oral Q breakfast Amin, Ankit C, MD       guaiFENesin (MUCINEX) 12 hr tablet 600 mg  600 mg Oral BID PRN Amin, Ankit C, MD       hydrALAZINE (APRESOLINE)  injection 10 mg  10 mg Intravenous Q4H PRN Amin, Ankit C, MD       HYDROmorphone (DILAUDID) injection 0.5-1 mg  0.5-1 mg Intravenous Q2H PRN Amin, Ankit C, MD       ipratropium-albuterol (DUONEB) 0.5-2.5 (3) MG/3ML nebulizer solution 3 mL  3 mL Nebulization Q4H PRN Amin, Ankit C, MD       metoprolol tartrate (LOPRESSOR) injection 10 mg  10 mg Intravenous Q4H PRN Amin, Ankit C, MD       nicotine (NICODERM CQ - dosed in mg/24 hours) patch 21 mg  21 mg Transdermal Daily PRN Amin, Ankit C, MD       ondansetron (ZOFRAN) tablet 4 mg  4 mg Oral Q6H PRN Amin, Ankit C, MD       Or   ondansetron (ZOFRAN) injection 4 mg  4 mg Intravenous Q6H PRN Amin, Ankit C, MD       oxyCODONE (Oxy IR/ROXICODONE) immediate release tablet 5 mg  5 mg Oral Q4H PRN Amin, Ankit C, MD       pantoprazole (PROTONIX) injection 40 mg  40 mg Intravenous Q12H Amin, Ankit C, MD   40 mg  at 12/07/23 0824   senna-docusate (Senokot-S) tablet 1 tablet  1 tablet Oral QHS PRN Amin, Ankit C, MD       sodium phosphate (FLEET) enema 1 enema  1 enema Rectal Once PRN Amin, Ankit C, MD       traZODone (DESYREL) tablet 50 mg  50 mg Oral QHS PRN Amin, Ankit C, MD       Allergies as of 12/06/2023 - Review Complete 12/06/2023  Allergen Reaction Noted   Sulfa antibiotics Anaphylaxis and Swelling 08/14/2015   Dexlansoprazole Other (See Comments) 08/14/2015   Ace inhibitors Cough 11/05/2015   Oxycodone Nausea Only 12/19/2017   Penicillins Rash 08/14/2015   Family History  Problem Relation Age of Onset   Breast cancer Maternal Grandmother        in 54's   Heart disease Neg Hx    Social History   Socioeconomic History   Marital status: Married    Spouse name: Not on file   Number of children: Not on file   Years of education: Not on file   Highest education level: Not on file  Occupational History   Not on file  Tobacco Use   Smoking status: Never   Smokeless tobacco: Never  Vaping Use   Vaping status: Never Used  Substance and Sexual Activity   Alcohol use: Not on file   Drug use: Not on file   Sexual activity: Not Currently    Birth control/protection: None    Comment: intercourse age 48, less than 5 sexual partners  Other Topics Concern   Not on file  Social History Narrative   Not on file   Social Drivers of Health   Financial Resource Strain: Low Risk  (09/19/2023)   Received from [facility]   Overall Financial Resource Strain (CARDIA)    Difficulty of Paying Living Expenses: Not hard at all  Food Insecurity: No Food Insecurity (12/06/2023)   Hunger Vital Sign    Worried About Running Out of Food in the Last Year: Never true    Ran Out of Food in the Last Year: Never true  Transportation Needs: No Transportation Needs (12/06/2023)   PRAPARE - Administrator, Civil Service (Medical): No    Lack of Transportation (Non-Medical): No   Physical Activity: Not on file  Stress: Not on file  Social Connections: Moderately Integrated (12/07/2023)   Social Connection and Isolation Panel [NHANES]    Frequency of Communication with Friends and Family: More than three times a week    Frequency of Social Gatherings with Friends and Family: Once a week    Attends Religious Services: More than 4 times per year    Active Member of [employer] or Organizations: No    Attends Banker Meetings: Never    Marital Status: Married  Catering manager Violence: Not At Risk (12/06/2023)   Humiliation, Afraid, Rape, and Kick questionnaire    Fear of Current or Ex-Partner: No    Emotionally Abused: No    Physically Abused: No    Sexually Abused: No   Review of Systems:  Review of Systems  Respiratory:  Positive for cough and shortness of breath.   Cardiovascular:  Negative for chest pain.  Gastrointestinal:  Positive for melena. Negative for abdominal pain, nausea and vomiting.    OBJECTIVE:   Temp:  [97.3 F (36.3 C)-98.6 F (37 C)] 98.4 F (36.9 C) (03/05 1002) Pulse Rate:  [71-93] 71 (03/05 1002) Resp:  [15-20] 16 (03/05 1002) BP: (86-139)/(39-91) 130/65 (03/05 1002) SpO2:  [94 %-100 %] 98 % (03/05 1002) Weight:  [80.7 kg] 80.7 kg (03/04 1521) Last BM Date : 12/06/23 Physical Exam Constitutional:      General: She is not in acute distress.    Appearance: She is not ill-appearing, toxic-appearing or diaphoretic.  Cardiovascular:     Rate and Rhythm: Normal rate and regular rhythm.  Pulmonary:     Effort: No respiratory distress.     Breath sounds: Normal breath sounds.  Abdominal:     General: Bowel sounds are normal. There is no distension.     Palpations: Abdomen is soft.     Tenderness: There is no abdominal tenderness. There is no guarding.  Neurological:     Mental Status: She is alert.     Labs: Recent Labs    12/06/23 1626 12/07/23 0503  WBC 14.9* 12.4*  HGB 6.5* 8.4*  HCT 22.5* 27.4*  PLT 349 262    BMET Recent Labs    12/06/23 1626 12/07/23 0503  NA 138 138  K 4.5 4.2  CL 105 107  CO2 24 24  GLUCOSE 211* 103*  BUN 50* 37*  CREATININE 1.35* 1.18*  CALCIUM 8.6* 7.9*   LFT Recent Labs    12/06/23 1626  PROT 5.8*  ALBUMIN 3.4*  AST 19  ALT 15  ALKPHOS 32*  BILITOT 0.7   PT/INR No results for input(s): "LABPROT", "INR" in the last 72 hours.  Diagnostic imaging: DG Chest Portable 1 View Result Date: 12/06/2023 CLINICAL DATA:  Shortness of breath and fatigue. EXAM: PORTABLE CHEST 1 VIEW COMPARISON:  August 05, 2019 FINDINGS: The cardiac silhouette is mildly enlarged and unchanged in size. Moderate to marked severity calcification of the aortic arch is seen. There is very mild atelectasis is noted along the periphery of the left lung base. No pleural effusion or pneumothorax is identified. There is a large, stable hiatal hernia. Multilevel degenerative changes are noted throughout the thoracic spine. IMPRESSION: 1. Very mild left basilar atelectasis. 2. Large, stable hiatal hernia. Electronically Signed   By: Aram Candela M.D.   On: 12/06/2023 18:27   IMPRESSION: Melena Acute blood loss anemia Large hiatal hernia, 10 cm, stable Diverticulosis Antral erythema DVT/PE on Eliquis Iron deficiency anemia on iron supplementation  PLAN: -Has had recent EGD  and colonoscopy, recommend repeat video capsule endoscopy to evaluate for any signs of active bleeding -Continue iron supplementation -Continue IV PPI every 12 hours -Hold Eliquis -Monitor bowel movements -Trend H/H, transfuse for hemoglobin less than 7 -Further recommendations to follow pending video capsule endoscopy   LOS: 1 day   Liliane Shi, DO [facility] Gastroenterology

## 2023-12-07 NOTE — Progress Notes (Signed)
 Mobility Specialist - Progress Note   12/07/23 0913  Mobility  Activity Ambulated with assistance in hallway  Level of Assistance Modified independent, requires aide device or extra time  Assistive Device Other (Comment) (IV Pole)  Distance Ambulated (ft) 250 ft  Activity Response Tolerated well  Mobility Referral Yes  Mobility visit 1 Mobility  Mobility Specialist Start Time (ACUTE ONLY) 0859  Mobility Specialist Stop Time (ACUTE ONLY) 0909  Mobility Specialist Time Calculation (min) (ACUTE ONLY) 10 min   Pt received in bed and agreeable to mobility. Distance limited d/t fatigue. Pt to bed after session with all needs met.    [facility]

## 2023-12-07 NOTE — Hospital Course (Addendum)
 Brief Narrative:  83 y.o. female with medical history significant of DVT/PE on Eliquis, CVA, GI bleed secondary to [facility] lesion, diverticulosis, iron deficiency anemia, HLD, HTN, CKD 3A comes to the hospital for evaluation of symptomatic anemia.    Assessment & Plan:  Principal Problem:   Melena Active Problems:   Acute upper GI bleed   Essential hypertension   Gastroesophageal reflux disease   History of ischemic stroke   Osteoporosis   Chronic kidney disease, stage 3a (HCC)   History of pulmonary embolism    Acute upper GI bleed with melanotic stool Symptomatic anemia - Admission hemoglobin 6.6, 2 units PRBC ordered by EDP.  Baseline hemoglobin 10.  Continue PPI IV twice daily.  [facility] consulted. - Rare leukocytosis improving - Ferritin borderline low therefore start iron supplements with bowel regimen   EGD August 2000 24-10 cm hiatal hernia, gastritis, gastric polyp   History of CVA History of PE/DVT - Eliquis is currently on hold secondary to GI bleed evaluation.   Hyperlipidemia - Hold statin   CKD stage IIIa - Creatinine around baseline 1.3.  Closely monitor   Osteoarthritis - Pain control.  Tylenol as needed   Essential hypertension - Holding home diuretics.  On IV as needed medications.   Vitamin D deficiency - Supplements on hold       DVT prophylaxis: SCDs Start: 12/06/23 1804    Code Status: Full Code Family Communication:   Status is: Inpatient Remains inpatient appropriate because: Ongoing GI evaluation for symptomatic anemia and dark stools.    Subjective: Seen at bedside, tells me she feels slightly better today after getting PRBC transfusion.   Examination:  General exam: Appears calm and comfortable  Respiratory system: Clear to auscultation. Respiratory effort normal. Cardiovascular system: S1 & S2 heard, RRR. No JVD, murmurs, rubs, gallops or clicks. No pedal edema. Gastrointestinal system: Abdomen is nondistended, soft and  nontender. No organomegaly or masses felt. Normal bowel sounds heard. Central nervous system: Alert and oriented. No focal neurological deficits. Extremities: Symmetric 5 x 5 power. Skin: No rashes, lesions or ulcers Psychiatry: Judgement and insight appear normal. Mood & affect appropriate.

## 2023-12-08 ENCOUNTER — Inpatient Hospital Stay (HOSPITAL_COMMUNITY)

## 2023-12-08 DIAGNOSIS — K921 Melena: Secondary | ICD-10-CM | POA: Diagnosis not present

## 2023-12-08 LAB — TYPE AND SCREEN
ABO/RH(D): B NEG
Antibody Screen: NEGATIVE
Unit division: 0
Unit division: 0

## 2023-12-08 LAB — CBC
HCT: 28.4 % — ABNORMAL LOW (ref 36.0–46.0)
Hemoglobin: 8.8 g/dL — ABNORMAL LOW (ref 12.0–15.0)
MCH: 29.9 pg (ref 26.0–34.0)
MCHC: 31 g/dL (ref 30.0–36.0)
MCV: 96.6 fL (ref 80.0–100.0)
Platelets: 260 10*3/uL (ref 150–400)
RBC: 2.94 MIL/uL — ABNORMAL LOW (ref 3.87–5.11)
RDW: 18.7 % — ABNORMAL HIGH (ref 11.5–15.5)
WBC: 9.9 10*3/uL (ref 4.0–10.5)
nRBC: 0 % (ref 0.0–0.2)

## 2023-12-08 LAB — BPAM RBC
Blood Product Expiration Date: 202503202359
ISSUE DATE / TIME: 202503042118
ISSUE DATE / TIME: 202503050015
ISSUE DATE / TIME: 202504052359
Unit Type and Rh: 1700
Unit Type and Rh: 202503202359
Unit Type and Rh: 202504052359
Unit Type and Rh: 9500

## 2023-12-08 LAB — BASIC METABOLIC PANEL
Anion gap: 8 (ref 5–15)
BUN: 28 mg/dL — ABNORMAL HIGH (ref 8–23)
CO2: 24 mmol/L (ref 22–32)
Calcium: 8.3 mg/dL — ABNORMAL LOW (ref 8.9–10.3)
Chloride: 109 mmol/L (ref 98–111)
Creatinine, Ser: 1.2 mg/dL — ABNORMAL HIGH (ref 0.44–1.00)
GFR, Estimated: 45 mL/min — ABNORMAL LOW (ref >60)
Glucose, Bld: 97 mg/dL (ref 70–99)
Potassium: 4.2 mmol/L (ref 3.5–5.1)
Sodium: 141 mmol/L (ref 135–145)

## 2023-12-08 LAB — MAGNESIUM: Magnesium: 2.6 mg/dL — ABNORMAL HIGH (ref 1.7–2.4)

## 2023-12-08 MED ORDER — TRAMADOL HCL 50 MG PO TABS
50.0000 mg | ORAL_TABLET | Freq: Two times a day (BID) | ORAL | Status: DC
  Administered 2023-12-08 – 2023-12-10 (×5): 50 mg via ORAL
  Filled 2023-12-08 (×5): qty 1

## 2023-12-08 MED ORDER — TECHNETIUM TC 99M-LABELED RED BLOOD CELLS IV KIT
21.7000 | PACK | Freq: Once | INTRAVENOUS | Status: AC
  Administered 2023-12-08: 21.7 via INTRAVENOUS

## 2023-12-08 NOTE — Progress Notes (Signed)
 PROGRESS NOTE    Cheryl Goodman  ZOX:[id number] DOB: 12-May-1941 DOA: 12/06/2023 PCP: Danella Penton, MD    Brief Narrative:  83 y.o. female with medical history significant of DVT/PE on Eliquis, CVA, GI bleed secondary to [facility] lesion, diverticulosis, iron deficiency anemia, HLD, HTN, CKD 3A comes to the hospital for evaluation of symptomatic anemia.  [facility] follow morning, video capsule placed   Assessment & Plan:  Principal Problem:   Melena Active Problems:   Acute upper GI bleed   Essential hypertension   Gastroesophageal reflux disease   History of ischemic stroke   Osteoporosis   Chronic kidney disease, stage 3a (HCC)   History of pulmonary embolism    Acute upper GI bleed with melanotic stool Symptomatic anemia - Admission hemoglobin 6.6, 2 units PRBC ordered by EDP.  Baseline hemoglobin 10.  Continue PPI IV twice daily.  [facility] consulted, video capsule placed. - Ferritin borderline low therefore start iron supplements with bowel regimen   EGD August 2000 24-10 cm hiatal hernia, gastritis, gastric polyp   History of CVA History of PE/DVT - Eliquis is currently on hold secondary to GI bleed evaluation.   Hyperlipidemia - Hold statin   CKD stage IIIa - Creatinine around baseline 1.3.  Closely monitor   Osteoarthritis - Pain control.  Tylenol as needed   Essential hypertension - Holding home diuretics.  On IV as needed medications.   Vitamin D deficiency - Supplements on hold     DVT prophylaxis: SCDs Start: 12/06/23 1804    Code Status: Full Code Family Communication:   Status is: Inpatient Remains inpatient appropriate because: Ongoing GI evaluation for dark stools   Subjective:  Dark stools overnight  VRE results pending.   Examination:  General exam: Appears calm and comfortable  Respiratory system: Clear to auscultation. Respiratory effort normal. Cardiovascular system: S1 & S2 heard, RRR. No JVD, murmurs, rubs, gallops or clicks.  No pedal edema. Gastrointestinal system: Abdomen is nondistended, soft and nontender. No organomegaly or masses felt. Normal bowel sounds heard. Central nervous system: Alert and oriented. No focal neurological deficits. Extremities: Symmetric 5 x 5 power. Skin: No rashes, lesions or ulcers Psychiatry: Judgement and insight appear normal. Mood & affect appropriate.                Diet Orders (From admission, onward)     Start     Ordered   12/08/23 0629  Diet regular Room service appropriate? Yes; Fluid consistency: Thin  Diet effective now       Question Answer Comment  Room service appropriate? Yes   Fluid consistency: Thin      12/08/23 0628            Objective: Vitals:   12/07/23 1751 12/07/23 2218 12/08/23 0500 12/08/23 0625  BP: 111/68 122/65  136/71  Pulse: 74 80  77  Resp: 16 18  17   Temp: 98 F (36.7 C) 98.3 F (36.8 C)  98 F (36.7 C)  TempSrc: Oral Oral  Oral  SpO2: 100% 97%  100%  Weight:   80.5 kg   Height:        Intake/Output Summary (Last 24 hours) at 12/08/2023 1104 Last data filed at 12/07/2023 2020 Gross per 24 hour  Intake 240 ml  Output --  Net 240 ml   Filed Weights   12/06/23 1521 12/07/23 1413 12/08/23 0500  Weight: 80.7 kg 80.7 kg 80.5 kg    Scheduled Meds:  docusate sodium  100  mg Oral BID   ferrous sulfate  325 mg Oral Q breakfast   pantoprazole (PROTONIX) IV  40 mg Intravenous Q12H   Continuous Infusions:  Nutritional status     Body mass index is 37.09 kg/m.  Data Reviewed:   CBC: Recent Labs  Lab 12/06/23 1626 12/07/23 0503 12/08/23 0448  WBC 14.9* 12.4* 9.9  NEUTROABS 12.4*  --   --   HGB 6.5* 8.4* 8.8*  HCT 22.5* 27.4* 28.4*  MCV 101.8* 96.8 96.6  PLT 349 262 260   Basic Metabolic Panel: Recent Labs  Lab 12/06/23 1626 12/07/23 0503 12/08/23 0448  NA 138 138 141  K 4.5 4.2 4.2  CL 105 107 109  CO2 24 24 24   GLUCOSE 211* 103* 97  BUN 50* 37* 28*  CREATININE 1.35* 1.18* 1.20*  CALCIUM  8.6* 7.9* 8.3*  MG  --  2.4 2.6*  PHOS  --  3.4  --    GFR: Estimated Creatinine Clearance: 31.8 mL/min (A) (by C-G formula based on SCr of 1.2 mg/dL (H)). Liver Function Tests: Recent Labs  Lab 12/06/23 1626  AST 19  ALT 15  ALKPHOS 32*  BILITOT 0.7  PROT 5.8*  ALBUMIN 3.4*   No results for input(s): "LIPASE", "AMYLASE" in the last 168 hours. No results for input(s): "AMMONIA" in the last 168 hours. Coagulation Profile: No results for input(s): "INR", "PROTIME" in the last 168 hours. Cardiac Enzymes: No results for input(s): "CKTOTAL", "CKMB", "CKMBINDEX", "TROPONINI" in the last 168 hours. BNP (last 3 results) No results for input(s): "PROBNP" in the last 8760 hours. HbA1C: No results for input(s): "HGBA1C" in the last 72 hours. CBG: No results for input(s): "GLUCAP" in the last 168 hours. Lipid Profile: No results for input(s): "CHOL", "HDL", "LDLCALC", "TRIG", "CHOLHDL", "LDLDIRECT" in the last 72 hours. Thyroid Function Tests: No results for input(s): "TSH", "T4TOTAL", "FREET4", "T3FREE", "THYROIDAB" in the last 72 hours. Anemia Panel: Recent Labs    12/06/23 1935  VITAMINB12 2,873*  FERRITIN 84  TIBC 309  IRON 60   Sepsis Labs: No results for input(s): "PROCALCITON", "LATICACIDVEN" in the last 168 hours.  No results found for this or any previous visit (from the past 240 hours).       Radiology Studies: DG Chest Portable 1 View Result Date: 12/06/2023 CLINICAL DATA:  Shortness of breath and fatigue. EXAM: PORTABLE CHEST 1 VIEW COMPARISON:  August 05, 2019 FINDINGS: The cardiac silhouette is mildly enlarged and unchanged in size. Moderate to marked severity calcification of the aortic arch is seen. There is very mild atelectasis is noted along the periphery of the left lung base. No pleural effusion or pneumothorax is identified. There is a large, stable hiatal hernia. Multilevel degenerative changes are noted throughout the thoracic spine. IMPRESSION: 1.  Very mild left basilar atelectasis. 2. Large, stable hiatal hernia. Electronically Signed   By: Aram Candela M.D.   On: 12/06/2023 18:27           LOS: 2 days   Time spent= 35 mins    Miguel Rota, MD [facility]  If 7PM-7AM, please contact night-coverage  12/08/2023, 11:04 AM

## 2023-12-08 NOTE — Plan of Care (Signed)

## 2023-12-08 NOTE — Progress Notes (Signed)
 [facility] Progress Note  SUBJECTIVE:   Interval history: Cheryl Goodman was seen and evaluated today at bedside. Resting comfortably in bed. No abdominal pain, chest pain or shortness of breath. Video capsule endoscopy completed, on active bleeding in stomach or small bowel, possible dark colored stool in R colon (has areas of diverticulosis in ascending, transverse, descending and sigmoid colon based on prior colonoscopy in November 2024). Last melanic bowel movement was this AM at 1100.  Past Medical History:  Diagnosis Date   Acute pulmonary embolism with acute cor pulmonale (HCC) 08/10/2019   Anemia    Arthritis    Dysphagia    GERD (gastroesophageal reflux disease)    History of hiatal hernia    History of kidney stones    x2 ; passed independently   Hyperlipidemia    Hypertension    Nausea    OA (osteoarthritis) of knee 10/31/2017   Sleep apnea    Stroke [facility])    Past Surgical History:  Procedure Laterality Date   ABDOMINAL HYSTERECTOMY     APPENDECTOMY     BIOPSY  12/28/2022   Procedure: BIOPSY;  Surgeon: Lynann Bologna, DO;  Location: [facility] ENDOSCOPY;  Service: Gastroenterology;;   BREAST BIOPSY Right    BREAST SURGERY     CARPAL TUNNEL RELEASE     CHOLECYSTECTOMY     COLONOSCOPY WITH PROPOFOL N/A 09/01/2015   Procedure: COLONOSCOPY WITH PROPOFOL;  Surgeon: Scot Jun, MD;  Location: [facility] ENDOSCOPY;  Service: Endoscopy;  Laterality: N/A;   COLONOSCOPY WITH PROPOFOL N/A 04/29/2021   Procedure: COLONOSCOPY WITH PROPOFOL;  Surgeon: Earline Mayotte, MD;  Location: [facility] ENDOSCOPY;  Service: Gastroenterology;  Laterality: N/A;   ESOPHAGOGASTRODUODENOSCOPY N/A 07/17/2019   Procedure: ESOPHAGOGASTRODUODENOSCOPY (EGD);  Surgeon: Pasty Spillers, MD;  Location: [facility] ENDOSCOPY;  Service: Endoscopy;  Laterality: N/A;   ESOPHAGOGASTRODUODENOSCOPY N/A 12/28/2022   Procedure: ESOPHAGOGASTRODUODENOSCOPY (EGD);  Surgeon: Lynann Bologna, DO;  Location:  [facility] ENDOSCOPY;  Service: Gastroenterology;  Laterality: N/A;   ESOPHAGOGASTRODUODENOSCOPY (EGD) WITH PROPOFOL N/A 04/29/2021   Procedure: ESOPHAGOGASTRODUODENOSCOPY (EGD) WITH PROPOFOL;  Surgeon: Earline Mayotte, MD;  Location: [facility] ENDOSCOPY;  Service: Gastroenterology;  Laterality: N/A;   ESOPHAGOGASTRODUODENOSCOPY (EGD) WITH PROPOFOL N/A 09/21/2022   Procedure: ESOPHAGOGASTRODUODENOSCOPY (EGD) WITH PROPOFOL;  Surgeon: Willis Modena, MD;  Location: [facility] ENDOSCOPY;  Service: Gastroenterology;  Laterality: N/A;   ESOPHAGOGASTRODUODENOSCOPY (EGD) WITH PROPOFOL N/A 05/23/2023   Procedure: ESOPHAGOGASTRODUODENOSCOPY (EGD) WITH PROPOFOL;  Surgeon: Willis Modena, MD;  Location: [facility] ENDOSCOPY;  Service: Gastroenterology;  Laterality: N/A;   IR KYPHO LUMBAR INC FX REDUCE BONE BX UNI/BIL CANNULATION INC/IMAGING  10/19/2022   IR RADIOLOGIST EVAL & MGMT  11/04/2022   REDUCTION MAMMAPLASTY Bilateral    SAVORY DILATION N/A 09/01/2015   Procedure: SAVORY DILATION;  Surgeon: Scot Jun, MD;  Location: [facility] ENDOSCOPY;  Service: Endoscopy;  Laterality: N/A;   TOTAL KNEE ARTHROPLASTY Right 10/31/2017   Procedure: RIGHT TOTAL KNEE ARTHROPLASTY;  Surgeon: Ollen Gross, MD;  Location: [facility] ORS;  Service: Orthopedics;  Laterality: Right;   Current Facility-Administered Medications  Medication Dose Route Frequency Provider Last Rate Last Admin   acetaminophen (TYLENOL) tablet 650 mg  650 mg Oral Q6H PRN Amin, Ankit C, MD   650 mg at 12/07/23 1324   Or   acetaminophen (TYLENOL) suppository 650 mg  650 mg Rectal Q6H PRN Amin, Ankit C, MD       bisacodyl (DULCOLAX) EC tablet 5 mg  5 mg Oral Daily PRN Miguel Rota, MD  5 mg at 12/07/23 1716   docusate sodium (COLACE) capsule 100 mg  100 mg Oral BID Amin, Ankit C, MD   100 mg at 12/07/23 1716   ferrous sulfate tablet 325 mg  325 mg Oral Q breakfast Amin, Ankit C, MD   325 mg at 12/08/23 0805   guaiFENesin (MUCINEX) 12 hr tablet 600 mg  600 mg Oral BID PRN Amin,  Ankit C, MD       hydrALAZINE (APRESOLINE) injection 10 mg  10 mg Intravenous Q4H PRN Amin, Ankit C, MD       HYDROmorphone (DILAUDID) injection 0.5-1 mg  0.5-1 mg Intravenous Q2H PRN Amin, Ankit C, MD       ipratropium-albuterol (DUONEB) 0.5-2.5 (3) MG/3ML nebulizer solution 3 mL  3 mL Nebulization Q4H PRN Amin, Ankit C, MD       metoprolol tartrate (LOPRESSOR) injection 10 mg  10 mg Intravenous Q4H PRN Amin, Ankit C, MD       nicotine (NICODERM CQ - dosed in mg/24 hours) patch 21 mg  21 mg Transdermal Daily PRN Amin, Ankit C, MD       ondansetron (ZOFRAN) tablet 4 mg  4 mg Oral Q6H PRN Amin, Ankit C, MD       Or   ondansetron (ZOFRAN) injection 4 mg  4 mg Intravenous Q6H PRN Amin, Ankit C, MD       oxyCODONE (Oxy IR/ROXICODONE) immediate release tablet 5 mg  5 mg Oral Q4H PRN Amin, Ankit C, MD       pantoprazole (PROTONIX) injection 40 mg  40 mg Intravenous Q12H Amin, Ankit C, MD   40 mg at 12/08/23 0934   senna-docusate (Senokot-S) tablet 1 tablet  1 tablet Oral QHS PRN Amin, Ankit C, MD       sodium phosphate (FLEET) enema 1 enema  1 enema Rectal Once PRN Amin, Ankit C, MD       traMADol (ULTRAM) tablet 50 mg  50 mg Oral Q12H Amin, Ankit C, MD   50 mg at 12/08/23 1223   traZODone (DESYREL) tablet 50 mg  50 mg Oral QHS PRN Amin, Ankit C, MD       Allergies as of 12/06/2023 - Review Complete 12/06/2023  Allergen Reaction Noted   Sulfa antibiotics Anaphylaxis and Swelling 08/14/2015   Dexlansoprazole Other (See Comments) 08/14/2015   Ace inhibitors Cough 11/05/2015   Oxycodone Nausea Only 12/19/2017   Penicillins Rash 08/14/2015   Review of Systems:  Review of Systems  Respiratory:  Negative for shortness of breath.   Cardiovascular:  Negative for chest pain.  Gastrointestinal:  Negative for abdominal pain, nausea and vomiting.    OBJECTIVE:   Temp:  [97.9 F (36.6 C)-98.3 F (36.8 C)] 97.9 F (36.6 C) (03/06 1149) Pulse Rate:  [70-96] 96 (03/06 1149) Resp:  [16-18] 18 (03/06  1149) BP: (111-136)/(46-71) 116/59 (03/06 1149) SpO2:  [96 %-100 %] 99 % (03/06 1149) Weight:  [80.5 kg-80.7 kg] 80.5 kg (03/06 0500) Last BM Date : 12/08/23 Physical Exam Constitutional:      General: She is not in acute distress.    Appearance: She is not ill-appearing, toxic-appearing or diaphoretic.  Cardiovascular:     Rate and Rhythm: Normal rate and regular rhythm.  Pulmonary:     Effort: No respiratory distress.     Breath sounds: Normal breath sounds.  Abdominal:     General: Bowel sounds are normal. There is no distension.     Palpations: Abdomen is soft.  Tenderness: There is no abdominal tenderness. There is no guarding.  Neurological:     Mental Status: She is alert.     Labs: Recent Labs    12/06/23 1626 12/07/23 0503 12/08/23 0448  WBC 14.9* 12.4* 9.9  HGB 6.5* 8.4* 8.8*  HCT 22.5* 27.4* 28.4*  PLT 349 262 260   BMET Recent Labs    12/06/23 1626 12/07/23 0503 12/08/23 0448  NA 138 138 141  K 4.5 4.2 4.2  CL 105 107 109  CO2 24 24 24   GLUCOSE 211* 103* 97  BUN 50* 37* 28*  CREATININE 1.35* 1.18* 1.20*  CALCIUM 8.6* 7.9* 8.3*   LFT Recent Labs    12/06/23 1626  PROT 5.8*  ALBUMIN 3.4*  AST 19  ALT 15  ALKPHOS 32*  BILITOT 0.7   PT/INR No results for input(s): "LABPROT", "INR" in the last 72 hours. Diagnostic imaging: DG Chest Portable 1 View Result Date: 12/06/2023 CLINICAL DATA:  Shortness of breath and fatigue. EXAM: PORTABLE CHEST 1 VIEW COMPARISON:  August 05, 2019 FINDINGS: The cardiac silhouette is mildly enlarged and unchanged in size. Moderate to marked severity calcification of the aortic arch is seen. There is very mild atelectasis is noted along the periphery of the left lung base. No pleural effusion or pneumothorax is identified. There is a large, stable hiatal hernia. Multilevel degenerative changes are noted throughout the thoracic spine. IMPRESSION: 1. Very mild left basilar atelectasis. 2. Large, stable hiatal hernia.  Electronically Signed   By: Aram Candela M.D.   On: 12/06/2023 18:27   IMPRESSION: Melena, continued  -VCE 12/07/23 with no signs of active GI bleeding Acute blood loss anemia, stable Large hiatal hernia, 10 cm, stable Diverticulosis Antral erythema DVT/PE on Eliquis Iron deficiency anemia on iron supplementation  PLAN: -Check GI bleed scan given persistence of melena, attempt to localize location of bleeding, possible R colon diverticula? -Continue to hold anticoagulant therapy -Ok for diet pending GIB scan  -DC IV PPI Q12Hr -Trend H/H, transfuse for Hgb < 7  -[facility] will follow   LOS: 2 days   Liliane Shi, [facility]

## 2023-12-09 DIAGNOSIS — K921 Melena: Secondary | ICD-10-CM | POA: Diagnosis not present

## 2023-12-09 LAB — MAGNESIUM: Magnesium: 2.1 mg/dL (ref 1.7–2.4)

## 2023-12-09 LAB — CBC
HCT: 27.5 % — ABNORMAL LOW (ref 36.0–46.0)
Hemoglobin: 8.5 g/dL — ABNORMAL LOW (ref 12.0–15.0)
MCH: 30.4 pg (ref 26.0–34.0)
MCHC: 30.9 g/dL (ref 30.0–36.0)
MCV: 98.2 fL (ref 80.0–100.0)
Platelets: 252 10*3/uL (ref 150–400)
RBC: 2.8 MIL/uL — ABNORMAL LOW (ref 3.87–5.11)
RDW: 18.4 % — ABNORMAL HIGH (ref 11.5–15.5)
WBC: 8.1 10*3/uL (ref 4.0–10.5)
nRBC: 0 % (ref 0.0–0.2)

## 2023-12-09 LAB — BASIC METABOLIC PANEL
Anion gap: 7 (ref 5–15)
BUN: 24 mg/dL — ABNORMAL HIGH (ref 8–23)
CO2: 22 mmol/L (ref 22–32)
Calcium: 7.9 mg/dL — ABNORMAL LOW (ref 8.9–10.3)
Chloride: 109 mmol/L (ref 98–111)
Creatinine, Ser: 1.22 mg/dL — ABNORMAL HIGH (ref 0.44–1.00)
GFR, Estimated: 44 mL/min — ABNORMAL LOW (ref >60)
Glucose, Bld: 104 mg/dL — ABNORMAL HIGH (ref 70–99)
Potassium: 4 mmol/L (ref 3.5–5.1)
Sodium: 138 mmol/L (ref 135–145)

## 2023-12-09 NOTE — Progress Notes (Signed)
   12/09/23 1318  TOC Brief Assessment  Insurance and Status Reviewed  Patient has primary care physician Yes  Home environment has been reviewed home with spouse  Prior level of function: independent  Prior/Current Home Services No current home services  Social Drivers of Health Review SDOH reviewed no interventions necessary  Readmission risk has been reviewed Yes  Transition of care needs no transition of care needs at this time

## 2023-12-09 NOTE — Progress Notes (Signed)
 [facility] Progress Note  SUBJECTIVE:   Interval history: Cheryl Goodman was seen and evaluated today at bedside.  Husband and daughter also at bedside.  Noted she had a brown bowel movement this morning.  No chest pain or shortness of breath, no abdominal pain, no nausea.  Past Medical History:  Diagnosis Date   Acute pulmonary embolism with acute cor pulmonale (HCC) 08/10/2019   Anemia    Arthritis    Dysphagia    GERD (gastroesophageal reflux disease)    History of hiatal hernia    History of kidney stones    x2 ; passed independently   Hyperlipidemia    Hypertension    Nausea    OA (osteoarthritis) of knee 10/31/2017   Sleep apnea    Stroke [facility])    Past Surgical History:  Procedure Laterality Date   ABDOMINAL HYSTERECTOMY     APPENDECTOMY     BIOPSY  12/28/2022   Procedure: BIOPSY;  Surgeon: Lynann Bologna, DO;  Location: [facility] ENDOSCOPY;  Service: Gastroenterology;;   BREAST BIOPSY Right    BREAST SURGERY     CARPAL TUNNEL RELEASE     CHOLECYSTECTOMY     COLONOSCOPY WITH PROPOFOL N/A 09/01/2015   Procedure: COLONOSCOPY WITH PROPOFOL;  Surgeon: Scot Jun, MD;  Location: [facility] ENDOSCOPY;  Service: Endoscopy;  Laterality: N/A;   COLONOSCOPY WITH PROPOFOL N/A 04/29/2021   Procedure: COLONOSCOPY WITH PROPOFOL;  Surgeon: Earline Mayotte, MD;  Location: [facility] ENDOSCOPY;  Service: Gastroenterology;  Laterality: N/A;   ESOPHAGOGASTRODUODENOSCOPY N/A 07/17/2019   Procedure: ESOPHAGOGASTRODUODENOSCOPY (EGD);  Surgeon: Pasty Spillers, MD;  Location: [facility] ENDOSCOPY;  Service: Endoscopy;  Laterality: N/A;   ESOPHAGOGASTRODUODENOSCOPY N/A 12/28/2022   Procedure: ESOPHAGOGASTRODUODENOSCOPY (EGD);  Surgeon: Lynann Bologna, DO;  Location: [facility] ENDOSCOPY;  Service: Gastroenterology;  Laterality: N/A;   ESOPHAGOGASTRODUODENOSCOPY (EGD) WITH PROPOFOL N/A 04/29/2021   Procedure: ESOPHAGOGASTRODUODENOSCOPY (EGD) WITH PROPOFOL;  Surgeon: Earline Mayotte, MD;   Location: [facility] ENDOSCOPY;  Service: Gastroenterology;  Laterality: N/A;   ESOPHAGOGASTRODUODENOSCOPY (EGD) WITH PROPOFOL N/A 09/21/2022   Procedure: ESOPHAGOGASTRODUODENOSCOPY (EGD) WITH PROPOFOL;  Surgeon: Willis Modena, MD;  Location: [facility] ENDOSCOPY;  Service: Gastroenterology;  Laterality: N/A;   ESOPHAGOGASTRODUODENOSCOPY (EGD) WITH PROPOFOL N/A 05/23/2023   Procedure: ESOPHAGOGASTRODUODENOSCOPY (EGD) WITH PROPOFOL;  Surgeon: Willis Modena, MD;  Location: [facility] ENDOSCOPY;  Service: Gastroenterology;  Laterality: N/A;   IR KYPHO LUMBAR INC FX REDUCE BONE BX UNI/BIL CANNULATION INC/IMAGING  10/19/2022   IR RADIOLOGIST EVAL & MGMT  11/04/2022   REDUCTION MAMMAPLASTY Bilateral    SAVORY DILATION N/A 09/01/2015   Procedure: SAVORY DILATION;  Surgeon: Scot Jun, MD;  Location: [facility] ENDOSCOPY;  Service: Endoscopy;  Laterality: N/A;   TOTAL KNEE ARTHROPLASTY Right 10/31/2017   Procedure: RIGHT TOTAL KNEE ARTHROPLASTY;  Surgeon: Ollen Gross, MD;  Location: [facility] ORS;  Service: Orthopedics;  Laterality: Right;   Current Facility-Administered Medications  Medication Dose Route Frequency Provider Last Rate Last Admin   acetaminophen (TYLENOL) tablet 650 mg  650 mg Oral Q6H PRN Amin, Ankit C, MD   650 mg at 12/07/23 1610   Or   acetaminophen (TYLENOL) suppository 650 mg  650 mg Rectal Q6H PRN Amin, Ankit C, MD       bisacodyl (DULCOLAX) EC tablet 5 mg  5 mg Oral Daily PRN Amin, Ankit C, MD   5 mg at 12/07/23 1716   docusate sodium (COLACE) capsule 100 mg  100 mg Oral BID Amin, Ankit C, MD   100 mg at  12/07/23 1716   ferrous sulfate tablet 325 mg  325 mg Oral Q breakfast Amin, Ankit C, MD   325 mg at 12/09/23 0855   guaiFENesin (MUCINEX) 12 hr tablet 600 mg  600 mg Oral BID PRN Amin, Ankit C, MD       hydrALAZINE (APRESOLINE) injection 10 mg  10 mg Intravenous Q4H PRN Amin, Ankit C, MD       HYDROmorphone (DILAUDID) injection 0.5-1 mg  0.5-1 mg Intravenous Q2H PRN Amin, Ankit C, MD        ipratropium-albuterol (DUONEB) 0.5-2.5 (3) MG/3ML nebulizer solution 3 mL  3 mL Nebulization Q4H PRN Amin, Ankit C, MD       metoprolol tartrate (LOPRESSOR) injection 10 mg  10 mg Intravenous Q4H PRN Amin, Ankit C, MD       nicotine (NICODERM CQ - dosed in mg/24 hours) patch 21 mg  21 mg Transdermal Daily PRN Amin, Ankit C, MD       ondansetron (ZOFRAN) tablet 4 mg  4 mg Oral Q6H PRN Amin, Ankit C, MD       Or   ondansetron (ZOFRAN) injection 4 mg  4 mg Intravenous Q6H PRN Amin, Ankit C, MD       oxyCODONE (Oxy IR/ROXICODONE) immediate release tablet 5 mg  5 mg Oral Q4H PRN Amin, Ankit C, MD       senna-docusate (Senokot-S) tablet 1 tablet  1 tablet Oral QHS PRN Amin, Ankit C, MD       sodium phosphate (FLEET) enema 1 enema  1 enema Rectal Once PRN Amin, Ankit C, MD       traMADol (ULTRAM) tablet 50 mg  50 mg Oral Q12H Amin, Ankit C, MD   50 mg at 12/09/23 0855   traZODone (DESYREL) tablet 50 mg  50 mg Oral QHS PRN Amin, Ankit C, MD       Allergies as of 12/06/2023 - Review Complete 12/06/2023  Allergen Reaction Noted   Sulfa antibiotics Anaphylaxis and Swelling 08/14/2015   Dexlansoprazole Other (See Comments) 08/14/2015   Ace inhibitors Cough 11/05/2015   Oxycodone Nausea Only 12/19/2017   Penicillins Rash 08/14/2015   Review of Systems:  Review of Systems  Respiratory:  Negative for shortness of breath.   Cardiovascular:  Negative for chest pain.  Gastrointestinal:  Negative for abdominal pain, melena, nausea and vomiting.    OBJECTIVE:   Temp:  [97.7 F (36.5 C)-98.3 F (36.8 C)] 97.8 F (36.6 C) (03/07 0625) Pulse Rate:  [68-84] 68 (03/07 0625) Resp:  [18] 18 (03/07 0625) BP: (126-153)/(56-72) 128/66 (03/07 0625) SpO2:  [98 %-100 %] 98 % (03/07 0625) Weight:  [80.5 kg] 80.5 kg (03/07 0500) Last BM Date : 12/08/23 Physical Exam Constitutional:      General: She is not in acute distress.    Appearance: She is not ill-appearing, toxic-appearing or diaphoretic.   Cardiovascular:     Rate and Rhythm: Normal rate and regular rhythm.  Pulmonary:     Effort: No respiratory distress.     Breath sounds: Normal breath sounds.  Abdominal:     General: Bowel sounds are normal. There is no distension.     Palpations: Abdomen is soft.     Tenderness: There is no abdominal tenderness. There is no guarding.  Neurological:     Mental Status: She is alert.     Labs: Recent Labs    12/07/23 0503 12/08/23 0448 12/09/23 0426  WBC 12.4* 9.9 8.1  HGB 8.4* 8.8* 8.5*  HCT 27.4* 28.4* 27.5*  PLT 262 260 252   BMET Recent Labs    12/07/23 0503 12/08/23 0448 12/09/23 0426  NA 138 141 138  K 4.2 4.2 4.0  CL 107 109 109  CO2 24 24 22   GLUCOSE 103* 97 104*  BUN 37* 28* 24*  CREATININE 1.18* 1.20* 1.22*  CALCIUM 7.9* 8.3* 7.9*   LFT Recent Labs    12/06/23 1626  PROT 5.8*  ALBUMIN 3.4*  AST 19  ALT 15  ALKPHOS 32*  BILITOT 0.7   PT/INR No results for input(s): "LABPROT", "INR" in the last 72 hours. Diagnostic imaging: NM GI Blood Loss Result Date: 12/08/2023 CLINICAL DATA:  Rectal bleeding for several days EXAM: NUCLEAR MEDICINE GASTROINTESTINAL BLEEDING SCAN TECHNIQUE: Sequential abdominal images were obtained following intravenous administration of Tc-80m labeled red blood cells. RADIOPHARMACEUTICALS:  21.7 mCi Tc-53m pertechnetate in-vitro labeled red cells. COMPARISON:  CTA from 05/18/2023 FINDINGS: Adequate uptake is noted throughout the aorta, liver and to a lesser degree within the kidneys. No focal area to suggest larger small-bowel hemorrhage is noted. IMPRESSION: No findings to suggest acute GI hemorrhage. Electronically Signed   By: Alcide Clever M.D.   On: 12/08/2023 19:18   IMPRESSION: Melena, continued             -VCE 12/07/23 with no signs of active GI bleeding  -GI bleed scan 12/08/2023 no active bleeding Acute blood loss anemia, stable Large hiatal hernia, 10 cm, stable Diverticulosis Antral erythema DVT/PE on Eliquis Iron  deficiency anemia on iron supplementation  PLAN: -Obscure GI bleeding, melena, negative video capsule endoscopy and negative GI bleed scan -No indication for endoscopy -Hemoglobin stable, now having brown stools, asymptomatic -Would recommend close discussion with hospitalist team and primary physician regarding use of Eliquis moving forward, had discussion with patient and her family at bedside today that risks may be outweighing benefits of current Eliquis dosing right now -Family and patient will also discuss further iron supplementation and possible intermittent RBC transfusion with hematology/oncology -Okay for discharge from GI perspective with close outpatient follow-up with PCP to trend hemoglobin -[facility] will sign off and be available as needed   LOS: 3 days   Liliane Shi, [facility]

## 2023-12-09 NOTE — Progress Notes (Signed)
 PROGRESS NOTE    Cheryl Goodman  ZOX:[id number] DOB: 12/04/1940 DOA: 12/06/2023 PCP: Danella Penton, MD   Brief Narrative:  This 83 yrs old female with PMH  significant for DVT/PE on Eliquis, Hx. of CVA, GI bleed secondary to [facility] [employer] lesion, diverticulosis, iron deficiency anemia, HLD, HTN, CKD 3A comes to the hospital for evaluation of symptomatic anemia.  She has received 2 units of PRBC so far.  H&H remains stable.  [facility] follow morning, video capsule placed, Nuclear bleeding scan negative.   Assessment & Plan:   Principal Problem:   Melena Active Problems:   Acute upper GI bleed   Essential hypertension   Gastroesophageal reflux disease   History of ischemic stroke   Osteoporosis   Chronic kidney disease, stage 3a (HCC)   History of pulmonary embolism  Acute upper GI bleed with melanotic stools: Symptomatic anemia: Hemoglobin 6.6 at admission , s/p 2 units PRBC > Hb improved to 8.8 Baseline hemoglobin 10.  Continue PPI IV twice daily.   [facility] consulted, video capsule placed. Ferritin borderline low therefore started on iron supplements with bowel regimen. Nuclear bleeding scan  > No findings to suggest acute GI hemorrhage Hb 8.8 > 8.5  .  Continue to monitor H&H. EGD August 2000 24-10 cm hiatal hernia, gastritis, gastric polyp Follow-up GI recommendation   History of CVA: History of PE/DVT: Eliquis is currently on hold secondary to GI bleed evaluation.   Hyperlipidemia: Hold statin.   CKD stage IIIa: Creatinine around baseline 1.3.   Closely monitor, Avoid nephrotoxic meds.   Osteoarthritis Adequate Pain control.  Tylenol as needed   Essential hypertension: Holding home diuretics.  On IV as needed medications.   Vitamin D deficiency: Continue vitamin D supplementation.    DVT prophylaxis:  SCDs Code Status: Full code Family Communication: No family at bed side. Disposition Plan:    Status is: Inpatient Remains inpatient appropriate because:  Admitted for GI bleeding    Consultants:  Gastroenterology  Procedures: Capsule endoscopy Antimicrobials:   Anti-infectives (From admission, onward)    None      Subjective: Patient was seen and examined at bedside.Overnight events noted.   Patient reports doing much better , She denies any further bleeding but states this is her fourth time she is in hospital, She does not feel comfortable that no cause is found. Nuclear scan is negative.  Objective: Vitals:   12/08/23 1749 12/08/23 2134 12/09/23 0500 12/09/23 0625  BP: (!) 153/72 (!) 126/56  128/66  Pulse: 75 84  68  Resp:  18  18  Temp: 97.7 F (36.5 C) 98.3 F (36.8 C)  97.8 F (36.6 C)  TempSrc: Oral Oral  Oral  SpO2: 100% 98%  98%  Weight:   80.5 kg   Height:        Intake/Output Summary (Last 24 hours) at 12/09/2023 1115 Last data filed at 12/09/2023 1000 Gross per 24 hour  Intake 120 ml  Output --  Net 120 ml   Filed Weights   12/07/23 1413 12/08/23 0500 12/09/23 0500  Weight: 80.7 kg 80.5 kg 80.5 kg    Examination:  General exam: Appears calm and comfortable, not in any acute distress. Respiratory system: Clear to auscultation. Respiratory effort normal. RR 16 Cardiovascular system: S1 & S2 heard, RRR. No JVD, murmurs, rubs, gallops or clicks. No pedal edema. Gastrointestinal system: Abdomen is non distended, soft and non tender.  Normal bowel sounds heard. Central nervous system: Alert and oriented X 3.  No focal neurological deficits. Extremities: No edema, no cyanosis, no clubbing. Skin: No rashes, lesions or ulcers Psychiatry: Judgement and insight appear normal. Mood & affect appropriate.     Data Reviewed: I have personally reviewed following labs and imaging studies  CBC: Recent Labs  Lab 12/06/23 1626 12/07/23 0503 12/08/23 0448 12/09/23 0426  WBC 14.9* 12.4* 9.9 8.1  NEUTROABS 12.4*  --   --   --   HGB 6.5* 8.4* 8.8* 8.5*  HCT 22.5* 27.4* 28.4* 27.5*  MCV 101.8* 96.8 96.6 98.2   PLT 349 262 260 252   Basic Metabolic Panel: Recent Labs  Lab 12/06/23 1626 12/07/23 0503 12/08/23 0448 12/09/23 0426  NA 138 138 141 138  K 4.5 4.2 4.2 4.0  CL 105 107 109 109  CO2 24 24 24 22   GLUCOSE 211* 103* 97 104*  BUN 50* 37* 28* 24*  CREATININE 1.35* 1.18* 1.20* 1.22*  CALCIUM 8.6* 7.9* 8.3* 7.9*  MG  --  2.4 2.6* 2.1  PHOS  --  3.4  --   --    GFR: Estimated Creatinine Clearance: 31.3 mL/min (A) (by C-G formula based on SCr of 1.22 mg/dL (H)). Liver Function Tests: Recent Labs  Lab 12/06/23 1626  AST 19  ALT 15  ALKPHOS 32*  BILITOT 0.7  PROT 5.8*  ALBUMIN 3.4*   No results for input(s): "LIPASE", "AMYLASE" in the last 168 hours. No results for input(s): "AMMONIA" in the last 168 hours. Coagulation Profile: No results for input(s): "INR", "PROTIME" in the last 168 hours. Cardiac Enzymes: No results for input(s): "CKTOTAL", "CKMB", "CKMBINDEX", "TROPONINI" in the last 168 hours. BNP (last 3 results) No results for input(s): "PROBNP" in the last 8760 hours. HbA1C: No results for input(s): "HGBA1C" in the last 72 hours. CBG: No results for input(s): "GLUCAP" in the last 168 hours. Lipid Profile: No results for input(s): "CHOL", "HDL", "LDLCALC", "TRIG", "CHOLHDL", "LDLDIRECT" in the last 72 hours. Thyroid Function Tests: No results for input(s): "TSH", "T4TOTAL", "FREET4", "T3FREE", "THYROIDAB" in the last 72 hours. Anemia Panel: Recent Labs    12/06/23 1935  VITAMINB12 2,873*  FERRITIN 84  TIBC 309  IRON 60   Sepsis Labs: No results for input(s): "PROCALCITON", "LATICACIDVEN" in the last 168 hours.  No results found for this or any previous visit (from the past 240 hours).   Radiology Studies: NM GI Blood Loss Result Date: 12/08/2023 CLINICAL DATA:  Rectal bleeding for several days EXAM: NUCLEAR MEDICINE GASTROINTESTINAL BLEEDING SCAN TECHNIQUE: Sequential abdominal images were obtained following intravenous administration of Tc-38m labeled  red blood cells. RADIOPHARMACEUTICALS:  21.7 mCi Tc-69m pertechnetate in-vitro labeled red cells. COMPARISON:  CTA from 05/18/2023 FINDINGS: Adequate uptake is noted throughout the aorta, liver and to a lesser degree within the kidneys. No focal area to suggest larger small-bowel hemorrhage is noted. IMPRESSION: No findings to suggest acute GI hemorrhage. Electronically Signed   By: Alcide Clever M.D.   On: 12/08/2023 19:18   Scheduled Meds:  docusate sodium  100 mg Oral BID   ferrous sulfate  325 mg Oral Q breakfast   traMADol  50 mg Oral Q12H   Continuous Infusions:   LOS: 3 days    Time spent: 50 mins    Willeen Niece, MD [facility]   If 7PM-7AM, please contact night-coverage

## 2023-12-09 NOTE — Plan of Care (Signed)
   Problem: Education: Goal: Knowledge of General Education information will improve Description: Including pain rating scale, medication(s)/side effects and non-pharmacologic comfort measures Outcome: Progressing   Problem: Clinical Measurements: Goal: Ability to maintain clinical measurements within normal limits will improve Outcome: Progressing Goal: Respiratory complications will improve Outcome: Progressing   Problem: Activity: Goal: Risk for activity intolerance will decrease Outcome: Progressing   Problem: Nutrition: Goal: Adequate nutrition will be maintained Outcome: Progressing

## 2023-12-10 DIAGNOSIS — K921 Melena: Secondary | ICD-10-CM | POA: Diagnosis not present

## 2023-12-10 LAB — CBC
HCT: 29.1 % — ABNORMAL LOW (ref 36.0–46.0)
Hemoglobin: 8.7 g/dL — ABNORMAL LOW (ref 12.0–15.0)
MCH: 29.8 pg (ref 26.0–34.0)
MCHC: 29.9 g/dL — ABNORMAL LOW (ref 30.0–36.0)
MCV: 99.7 fL (ref 80.0–100.0)
Platelets: 250 K/uL (ref 150–400)
RBC: 2.92 MIL/uL — ABNORMAL LOW (ref 3.87–5.11)
RDW: 17.3 % — ABNORMAL HIGH (ref 11.5–15.5)
WBC: 7.4 K/uL (ref 4.0–10.5)
nRBC: 0 % (ref 0.0–0.2)

## 2023-12-10 LAB — BASIC METABOLIC PANEL
Anion gap: 6 (ref 5–15)
BUN: 24 mg/dL — ABNORMAL HIGH (ref 8–23)
CO2: 24 mmol/L (ref 22–32)
Calcium: 8.4 mg/dL — ABNORMAL LOW (ref 8.9–10.3)
Chloride: 108 mmol/L (ref 98–111)
Creatinine, Ser: 1.22 mg/dL — ABNORMAL HIGH (ref 0.44–1.00)
GFR, Estimated: 44 mL/min — ABNORMAL LOW (ref >60)
Glucose, Bld: 107 mg/dL — ABNORMAL HIGH (ref 70–99)
Potassium: 4.3 mmol/L (ref 3.5–5.1)
Sodium: 138 mmol/L (ref 135–145)

## 2023-12-10 LAB — PHOSPHORUS: Phosphorus: 3.8 mg/dL (ref 2.5–4.6)

## 2023-12-10 LAB — MAGNESIUM: Magnesium: 2.1 mg/dL (ref 1.7–2.4)

## 2023-12-10 MED ORDER — FERROUS SULFATE 325 (65 FE) MG PO TABS: 325.0000 mg | ORAL_TABLET | Freq: Every day | ORAL | 2 refills | Status: DC

## 2023-12-10 NOTE — Progress Notes (Signed)
 Discharge instructions discussed with patient and family, verbalized agreement and understanding

## 2023-12-10 NOTE — Discharge Summary (Signed)
 Physician Discharge Summary  BUFFY EHLER ZOX:[id number] DOB: 28-Dec-1940 DOA: 83/01/2024  PCP: Danella Penton, MD  Admit date: 12/06/2023  Discharge date: 12/10/2023  Admitted From: Home  Disposition:  Home.  Recommendations for Outpatient Follow-up:  Follow up with PCP in 1-2 weeks. Please obtain BMP/CBC in one week. Advised to discuss with primary care physician about resumption of Eliquis. Advised to follow-up with PCP for H&H monitoring.  Home Health: None Equipment/Devices:None  Discharge Condition: Stable CODE STATUS:Full code Diet recommendation: Heart Healthy   Brief [facility] Course: This 83 yrs old female with PMH  significant for DVT/PE on Eliquis, Hx. of CVA, GI bleed secondary to [facility] lesion, diverticulosis, iron deficiency anemia, HLD, HTN, CKD 3A comes to the hospital for evaluation of symptomatic anemia. Patient was admitted for further evaluation. She has received 2 units of PRBC so far.  H&H remains stable.  [facility] following, video capsule placed, it was negative for bleeding.  Nuclear bleeding scan negative. H&H remains stable.  It was obscure GI bleeding with a negative video capsule endoscopy of negative bleeding scan.  There is no indication for endoscopy at this point.  H&H remains stable.  Patient is cleared for discharge from GI perspective.  Patient was advised to discuss with primary care physician about resumption of anticoagulation.  Patient is being discharged home.  Discharge Diagnoses:  Principal Problem:   Melena Active Problems:   Acute upper GI bleed   Essential hypertension   Gastroesophageal reflux disease   History of ischemic stroke   Osteoporosis   Chronic kidney disease, stage 3a (HCC)   History of pulmonary embolism  Acute upper GI bleed with melanotic stools: Symptomatic anemia: Hemoglobin 6.6 at admission , s/p 2 units PRBC > Hb improved to 8.8 Baseline hemoglobin 10.  Continue PPI IV twice daily.   [facility]  consulted, video capsule placed. Negative for bleeding. Ferritin borderline low therefore started on iron supplements with bowel regimen. Nuclear bleeding scan  > No findings to suggest acute GI hemorrhage Hb 8.8 > 8.5  .  Continue to monitor H&H. EGD August 2000 24-10 cm hiatal hernia, gastritis, gastric polyp GI recommended no plan for endoscopy.  Patient can be discharged home. Advised to follow-up with primary care physician about resumption of Eliquis. Risks and benefit would be discussed at PCP appointment.   History of CVA: History of PE/DVT: Eliquis is currently on hold secondary to GI bleed evaluation.   Hyperlipidemia: Resume statin.   CKD stage IIIa: Creatinine around baseline 1.3.   Closely monitor, Avoid nephrotoxic meds.   Osteoarthritis Adequate Pain control.  Tylenol as needed   Essential hypertension: Holding home diuretics.  On IV as needed medications.   Vitamin D deficiency: Continue vitamin D supplementation.    Discharge Instructions  Discharge Instructions     Call MD for:  difficulty breathing, headache or visual disturbances   Complete by: As directed    Call MD for:  persistant dizziness or light-headedness   Complete by: As directed    Call MD for:  redness, tenderness, or signs of infection (pain, swelling, redness, odor or green/yellow discharge around incision site)   Complete by: As directed    Diet - low sodium heart healthy   Complete by: As directed    Diet Carb Modified   Complete by: As directed    Discharge instructions   Complete by: As directed    Advised to follow-up with primary care physician in 1 week. Advised to discuss  with primary care physician about resumption of Eliquis. Advised to follow-up with PCP for H&H monitoring   Increase activity slowly   Complete by: As directed       Allergies as of 12/10/2023       Reactions   Sulfa Antibiotics Anaphylaxis, Swelling   Dexlansoprazole Other (See Comments)   Syncope    Ace Inhibitors Cough   Oxycodone Nausea Only   Penicillins Rash   Has patient had a PCN reaction causing immediate rash, facial/tongue/throat swelling, SOB or lightheadedness with hypotension: No Has patient had a PCN reaction causing severe rash involving mucus membranes or skin necrosis: No Has patient had a PCN reaction that required hospitalization: No Has patient had a PCN reaction occurring within the last 10 years: No If all of the above answers are "NO", then may proceed with Cephalosporin use.        Medication List     STOP taking these medications    cefdinir 300 MG capsule Commonly known as: OMNICEF   Eliquis 2.5 MG Tabs tablet Generic drug: apixaban   pantoprazole 40 MG tablet Commonly known as: PROTONIX   predniSONE 10 MG tablet Commonly known as: DELTASONE   propranolol 20 MG tablet Commonly known as: INDERAL   simvastatin 20 MG tablet Commonly known as: ZOCOR   tretinoin 0.05 % cream Commonly known as: RETIN-A       TAKE these medications    acetaminophen 325 MG tablet Commonly known as: TYLENOL Take 325-650 mg by mouth every 6 (six) hours as needed for headache or mild pain.   ferrous sulfate 325 (65 FE) MG tablet Take 1 tablet (325 mg total) by mouth daily with breakfast. Start taking on: December 11, 2023   Prolia 60 MG/ML Sosy injection Generic drug: denosumab Inject 60 mg into the skin every 6 (six) months.   sucralfate 1 g tablet Commonly known as: CARAFATE Take 1 g by mouth in the morning and at bedtime.   traMADol 50 MG tablet Commonly known as: ULTRAM Take 50 mg by mouth 2 (two) times daily.   triamterene-hydrochlorothiazide 37.5-25 MG capsule Commonly known as: DYAZIDE Take 1 capsule by mouth daily.   Vitamin D3 125 MCG (5000 UT) Tabs Take 5,000 Units by mouth daily.        Follow-up Information     Danella Penton, MD Follow up in 1 week(s).   Specialty: Internal Medicine Contact information: [phone] [facility] [address] [facility] [address] [place] [address] [phone]                Allergies  Allergen Reactions   Sulfa Antibiotics Anaphylaxis and Swelling   Dexlansoprazole Other (See Comments)    Syncope    Ace Inhibitors Cough   Oxycodone Nausea Only   Penicillins Rash    Has patient had a PCN reaction causing immediate rash, facial/tongue/throat swelling, SOB or lightheadedness with hypotension: No Has patient had a PCN reaction causing severe rash involving mucus membranes or skin necrosis: No Has patient had a PCN reaction that required hospitalization: No Has patient had a PCN reaction occurring within the last 10 years: No If all of the above answers are "NO", then may proceed with Cephalosporin use.    Consultations: Gastroenterology  Procedures/Studies: NM GI Blood Loss Result Date: 12/08/2023 CLINICAL DATA:  Rectal bleeding for several days EXAM: NUCLEAR MEDICINE GASTROINTESTINAL BLEEDING SCAN TECHNIQUE: Sequential abdominal images were obtained following intravenous administration of Tc-20m labeled red blood cells. RADIOPHARMACEUTICALS:  21.7 mCi  Tc-3m pertechnetate in-vitro labeled red cells. COMPARISON:  CTA from 05/18/2023 FINDINGS: Adequate uptake is noted throughout the aorta, liver and to a lesser degree within the kidneys. No focal area to suggest larger small-bowel hemorrhage is noted. IMPRESSION: No findings to suggest acute GI hemorrhage. Electronically Signed   By: Alcide Clever M.D.   On: 12/08/2023 19:18   DG Chest Portable 1 View Result Date: 12/06/2023 CLINICAL DATA:  Shortness of breath and fatigue. EXAM: PORTABLE CHEST 1 VIEW COMPARISON:  August 05, 2019 FINDINGS: The cardiac silhouette is mildly enlarged and unchanged in size. Moderate to marked severity calcification of the aortic arch is seen. There is very mild atelectasis is noted along the periphery of the left lung base. No pleural effusion or pneumothorax is identified. There is a large,  stable hiatal hernia. Multilevel degenerative changes are noted throughout the thoracic spine. IMPRESSION: 1. Very mild left basilar atelectasis. 2. Large, stable hiatal hernia. Electronically Signed   By: Aram Candela M.D.   On: 12/06/2023 18:27    Subjective: Patient was seen and examined at bedside. Overnight events noted.  Patient has brown-colored stool,  denies any further bleeding. Patient feels better,  denies any dizziness and wants to be discharged home.  Discharge Exam: Vitals:   12/09/23 2018 12/10/23 0437  BP: (!) 128/58 127/68  Pulse: 85 68  Resp: 19 15  Temp: 98 F (36.7 C) 97.8 F (36.6 C)  SpO2: 95% 99%   Vitals:   12/09/23 1344 12/09/23 2018 12/10/23 0437 12/10/23 0440  BP: 120/66 (!) 128/58 127/68   Pulse: 81 85 68   Resp: 16 19 15    Temp: 97.8 F (36.6 C) 98 F (36.7 C) 97.8 F (36.6 C)   TempSrc: Oral Oral Oral   SpO2: 97% 95% 99%   Weight:    84.7 kg  Height:        General: Pt is alert, awake, not in acute distress Cardiovascular: RRR, S1/S2 +, no rubs, no gallops Respiratory: CTA bilaterally, no wheezing, no rhonchi Abdominal: Soft, NT, ND, bowel sounds + Extremities: no edema, no cyanosis    The results of significant diagnostics from this hospitalization (including imaging, microbiology, ancillary and laboratory) are listed below for reference.     Microbiology: No results found for this or any previous visit (from the past 240 hours).   Labs: BNP (last 3 results) No results for input(s): "BNP" in the last 8760 hours. Basic Metabolic Panel: Recent Labs  Lab 12/06/23 1626 12/07/23 0503 12/08/23 0448 12/09/23 0426 12/10/23 0525  NA 138 138 141 138 138  K 4.5 4.2 4.2 4.0 4.3  CL 105 107 109 109 108  CO2 24 24 24 22 24   GLUCOSE 211* 103* 97 104* 107*  BUN 50* 37* 28* 24* 24*  CREATININE 1.35* 1.18* 1.20* 1.22* 1.22*  CALCIUM 8.6* 7.9* 8.3* 7.9* 8.4*  MG  --  2.4 2.6* 2.1 2.1  PHOS  --  3.4  --   --  3.8   Liver Function  Tests: Recent Labs  Lab 12/06/23 1626  AST 19  ALT 15  ALKPHOS 32*  BILITOT 0.7  PROT 5.8*  ALBUMIN 3.4*   No results for input(s): "LIPASE", "AMYLASE" in the last 168 hours. No results for input(s): "AMMONIA" in the last 168 hours. CBC: Recent Labs  Lab 12/06/23 1626 12/07/23 0503 12/08/23 0448 12/09/23 0426 12/10/23 0525  WBC 14.9* 12.4* 9.9 8.1 7.4  NEUTROABS 12.4*  --   --   --   --  HGB 6.5* 8.4* 8.8* 8.5* 8.7*  HCT 22.5* 27.4* 28.4* 27.5* 29.1*  MCV 101.8* 96.8 96.6 98.2 99.7  PLT 349 262 260 252 250   Cardiac Enzymes: No results for input(s): "CKTOTAL", "CKMB", "CKMBINDEX", "TROPONINI" in the last 168 hours. BNP: Invalid input(s): "POCBNP" CBG: No results for input(s): "GLUCAP" in the last 168 hours. D-Dimer No results for input(s): "DDIMER" in the last 72 hours. Hgb A1c No results for input(s): "HGBA1C" in the last 72 hours. Lipid Profile No results for input(s): "CHOL", "HDL", "LDLCALC", "TRIG", "CHOLHDL", "LDLDIRECT" in the last 72 hours. Thyroid function studies No results for input(s): "TSH", "T4TOTAL", "T3FREE", "THYROIDAB" in the last 72 hours.  Invalid input(s): "FREET3" Anemia work up No results for input(s): "VITAMINB12", "FOLATE", "FERRITIN", "TIBC", "IRON", "RETICCTPCT" in the last 72 hours. Urinalysis    Component Value Date/Time   COLORURINE YELLOW 09/18/2022 1632   APPEARANCEUR HAZY (A) 09/18/2022 1632   APPEARANCEUR Clear 02/17/2013 1708   LABSPEC 1.023 09/18/2022 1632   LABSPEC 1.004 02/17/2013 1708   PHURINE 5.0 09/18/2022 1632   GLUCOSEU NEGATIVE 09/18/2022 1632   GLUCOSEU Negative 02/17/2013 1708   HGBUR NEGATIVE 09/18/2022 1632   BILIRUBINUR NEGATIVE 09/18/2022 1632   BILIRUBINUR Negative 02/17/2013 1708   KETONESUR NEGATIVE 09/18/2022 1632   PROTEINUR NEGATIVE 09/18/2022 1632   NITRITE NEGATIVE 09/18/2022 1632   LEUKOCYTESUR MODERATE (A) 09/18/2022 1632   LEUKOCYTESUR 1+ 02/17/2013 1708   Sepsis Labs Recent Labs  Lab  12/07/23 0503 12/08/23 0448 12/09/23 0426 12/10/23 0525  WBC 12.4* 9.9 8.1 7.4   Microbiology No results found for this or any previous visit (from the past 240 hours).   Time coordinating discharge: Over 30 minutes  SIGNED:   Willeen Niece, MD  [facility] 12/10/2023, 11:33 AM Pager   If 7PM-7AM, please contact night-coverage

## 2023-12-10 NOTE — Discharge Instructions (Signed)
 Advised to follow-up with primary care physician in 1 week. Advised to discuss with primary care physician about resumption of Eliquis. Advised to follow-up with PCP for H&H monitoring

## 2023-12-11 ENCOUNTER — Encounter (HOSPITAL_COMMUNITY): Payer: Self-pay | Admitting: Internal Medicine

## 2023-12-26 DIAGNOSIS — D500 Iron deficiency anemia secondary to blood loss (chronic): Secondary | ICD-10-CM | POA: Diagnosis not present

## 2023-12-26 DIAGNOSIS — D509 Iron deficiency anemia, unspecified: Secondary | ICD-10-CM | POA: Diagnosis not present

## 2023-12-26 DIAGNOSIS — Z8673 Personal history of transient ischemic attack (TIA), and cerebral infarction without residual deficits: Secondary | ICD-10-CM | POA: Diagnosis not present

## 2024-01-25 DIAGNOSIS — Z8719 Personal history of other diseases of the digestive system: Secondary | ICD-10-CM | POA: Diagnosis not present

## 2024-01-25 DIAGNOSIS — D649 Anemia, unspecified: Secondary | ICD-10-CM | POA: Diagnosis not present

## 2024-02-13 DIAGNOSIS — K2960 Other gastritis without bleeding: Secondary | ICD-10-CM | POA: Diagnosis not present

## 2024-02-13 DIAGNOSIS — Z1331 Encounter for screening for depression: Secondary | ICD-10-CM | POA: Diagnosis not present

## 2024-02-13 DIAGNOSIS — G5701 Lesion of sciatic nerve, right lower limb: Secondary | ICD-10-CM | POA: Diagnosis not present

## 2024-02-13 DIAGNOSIS — Z8719 Personal history of other diseases of the digestive system: Secondary | ICD-10-CM | POA: Diagnosis not present

## 2024-02-13 DIAGNOSIS — Z0000 Encounter for general adult medical examination without abnormal findings: Secondary | ICD-10-CM | POA: Diagnosis not present

## 2024-03-05 DIAGNOSIS — K219 Gastro-esophageal reflux disease without esophagitis: Secondary | ICD-10-CM | POA: Diagnosis not present

## 2024-03-05 DIAGNOSIS — J4540 Moderate persistent asthma, uncomplicated: Secondary | ICD-10-CM | POA: Diagnosis not present

## 2024-03-08 DIAGNOSIS — M818 Other osteoporosis without current pathological fracture: Secondary | ICD-10-CM | POA: Diagnosis not present

## 2024-03-08 DIAGNOSIS — R739 Hyperglycemia, unspecified: Secondary | ICD-10-CM | POA: Diagnosis not present

## 2024-03-08 DIAGNOSIS — E782 Mixed hyperlipidemia: Secondary | ICD-10-CM | POA: Diagnosis not present

## 2024-03-08 DIAGNOSIS — E538 Deficiency of other specified B group vitamins: Secondary | ICD-10-CM | POA: Diagnosis not present

## 2024-03-22 DIAGNOSIS — R053 Chronic cough: Secondary | ICD-10-CM | POA: Diagnosis not present

## 2024-04-09 DIAGNOSIS — M1712 Unilateral primary osteoarthritis, left knee: Secondary | ICD-10-CM | POA: Diagnosis not present

## 2024-04-09 DIAGNOSIS — K449 Diaphragmatic hernia without obstruction or gangrene: Secondary | ICD-10-CM | POA: Diagnosis not present

## 2024-04-12 ENCOUNTER — Ambulatory Visit: Payer: Medicare HMO | Admitting: Internal Medicine

## 2024-04-12 ENCOUNTER — Inpatient Hospital Stay: Admitting: Oncology

## 2024-04-12 ENCOUNTER — Inpatient Hospital Stay: Payer: Medicare HMO

## 2024-04-16 ENCOUNTER — Ambulatory Visit (INDEPENDENT_AMBULATORY_CARE_PROVIDER_SITE_OTHER): Admitting: Surgery

## 2024-04-16 ENCOUNTER — Encounter: Payer: Self-pay | Admitting: Surgery

## 2024-04-16 VITALS — BP 115/75 | HR 116 | Temp 98.7°F | Ht 59.0 in | Wt 175.0 lb

## 2024-04-16 DIAGNOSIS — K449 Diaphragmatic hernia without obstruction or gangrene: Secondary | ICD-10-CM

## 2024-04-16 NOTE — Patient Instructions (Addendum)
 We will send a referral to Cardiology for you to have a Echocardiogram and to get Cardiac Clearance before we proceed with surgery. They will call you with an appointment.     We have scheduled you for a Barium Swallow Study at [facility] on 04/23/24 . Please arrive there by 9:00am for a 9:30am appointment . If you need to reschedule your Scan, you may do so by calling [phone]. Please let us  know if you reschedule your scan as we have to get authorization from your insurance for this.      Hiatal Hernia  A hiatal hernia occurs when part of the stomach slides above the muscle that separates the abdomen from the chest (diaphragm). A person can be born with a hiatal hernia (congenital), or it may develop over time. In almost all cases of hiatal hernia, only the top part of the stomach pushes through the diaphragm. Many people have a hiatal hernia with no symptoms. The larger the hernia, the more likely it is that you will have symptoms. In some cases, a hiatal hernia allows stomach acid to flow back into the tube that carries food from your mouth to your stomach (esophagus). This may cause heartburn symptoms. The development of heartburn symptoms may mean that you have a condition called gastroesophageal reflux disease (GERD). What are the causes? This condition is caused by a weakness in the opening (hiatus) where the esophagus passes through the diaphragm to attach to the upper part of the stomach. A person may be born with a weakness in the hiatus, or a weakness can develop over time. What increases the risk? This condition is more likely to develop in: Older people. Age is a major risk factor for a hiatal hernia, especially if you are over the age of 37. Pregnant women. People who are overweight. People who have frequent constipation. What are the signs or symptoms? Symptoms of this condition usually develop in the form of GERD symptoms. Symptoms  include: Heartburn. Upset stomach (indigestion). Trouble swallowing. Coughing or wheezing. Wheezing is making high-pitched whistling sounds when you breathe. Sore throat. Chest pain. Nausea and vomiting. How is this diagnosed? This condition may be diagnosed during testing for GERD. Tests that may be done include: X-rays of your stomach or chest. An upper gastrointestinal (GI) series. This is an X-ray exam of your GI tract that is taken after you swallow a chalky liquid that shows up clearly on the X-ray. Endoscopy. This is a procedure to look into your stomach using a thin, flexible tube that has a tiny camera and light on the end of it. How is this treated? This condition may be treated by: Dietary and lifestyle changes to help reduce GERD symptoms. Medicines. These may include: Over-the-counter antacids. Medicines that make your stomach empty more quickly. Medicines that block the production of stomach acid (H2 blockers). Stronger medicines to reduce stomach acid (proton pump inhibitors). Surgery to repair the hernia, if other treatments are not helping. If you have no symptoms, you may not need treatment. Follow these instructions at home: Lifestyle and activity Do not use any products that contain nicotine  or tobacco. These products include cigarettes, chewing tobacco, and vaping devices, such as e-cigarettes. If you need help quitting, ask your health care provider. Try to achieve and maintain a healthy body weight. Avoid putting pressure on your abdomen. Anything that puts pressure on your abdomen increases the amount of acid that may be pushed up into your esophagus. Avoid bending  over, especially after eating. Raise the head of your bed by putting blocks under the legs. This keeps your head and esophagus higher than your stomach. Do not wear tight clothing around your chest or stomach. Try not to strain when having a bowel movement, when urinating, or when lifting heavy  objects. Eating and drinking Avoid foods that can worsen GERD symptoms. These may include: Fatty foods, like fried foods. Citrus fruits, like oranges or lemon. Other foods and drinks that contain acid, like orange juice or tomatoes. Spicy food. Chocolate. Eat frequent small meals instead of three large meals a day. This helps prevent your stomach from getting too full. Eat slowly. Do not lie down right after eating. Do not eat 1-2 hours before bed. Do not drink beverages with caffeine. These include cola, coffee, cocoa, and tea. Do not drink alcohol. General instructions Take over-the-counter and prescription medicines only as told by your health care provider. Keep all follow-up visits. Your health care provider will want to check that any new prescribed medicines are helping your symptoms. Contact a health care provider if: Your symptoms are not controlled with medicines or lifestyle changes. You are having trouble swallowing. You have coughing or wheezing that will not go away. Your pain is getting worse. Your pain spreads to your arms, neck, jaw, teeth, or back. You feel nauseous or you vomit. Get help right away if: You have shortness of breath. You vomit blood. You have bright red blood in your stools. You have black, tarry stools. These symptoms may be an emergency. Get help right away. Call 911. Do not wait to see if the symptoms will go away. Do not drive yourself to the hospital. Summary A hiatal hernia occurs when part of the stomach slides above the muscle that separates the abdomen from the chest. A person may be born with a weakness in the hiatus, or a weakness can develop over time. Symptoms of a hiatal hernia may include heartburn, trouble swallowing, or sore throat. Management of a hiatal hernia includes eating frequent small meals instead of three large meals a day. Get help right away if you vomit blood, have bright red blood in your stools, or have black,  tarry stools. This information is not intended to replace advice given to you by your health care provider. Make sure you discuss any questions you have with your health care provider. Document Revised: 11/17/2021 Document Reviewed: 11/17/2021 Elsevier Patient Education  2024 [employer].

## 2024-04-17 NOTE — Progress Notes (Unsigned)
 Surgical Consultation  04/17/2024  Cheryl Goodman is an 83 y.o. female.   No chief complaint on file.    HPI: ***  Past Medical History:  Diagnosis Date   Acute pulmonary embolism with acute cor pulmonale (HCC) 08/10/2019   Anemia    Arthritis    Dysphagia    GERD (gastroesophageal reflux disease)    History of hiatal hernia    History of kidney stones    x2 ; passed independently   Hyperlipidemia    Hypertension    Nausea    OA (osteoarthritis) of knee 10/31/2017   Sleep apnea    Stroke [facility])     Past Surgical History:  Procedure Laterality Date   ABDOMINAL HYSTERECTOMY     APPENDECTOMY     BIOPSY  12/28/2022   Procedure: BIOPSY;  Surgeon: Kriss Estefana DEL, DO;  Location: [facility] ENDOSCOPY;  Service: Gastroenterology;;   BREAST BIOPSY Right    BREAST SURGERY     CARPAL TUNNEL RELEASE     CHOLECYSTECTOMY     COLONOSCOPY WITH PROPOFOL  N/A 09/01/2015   Procedure: COLONOSCOPY WITH PROPOFOL ;  Surgeon: Lamar ONEIDA Holmes, MD;  Location: [facility] ENDOSCOPY;  Service: Endoscopy;  Laterality: N/A;   COLONOSCOPY WITH PROPOFOL  N/A 04/29/2021   Procedure: COLONOSCOPY WITH PROPOFOL ;  Surgeon: Dessa Reyes ORN, MD;  Location: [facility] ENDOSCOPY;  Service: Gastroenterology;  Laterality: N/A;   ESOPHAGOGASTRODUODENOSCOPY N/A 07/17/2019   Procedure: ESOPHAGOGASTRODUODENOSCOPY (EGD);  Surgeon: Janalyn Keene NOVAK, MD;  Location: [facility] ENDOSCOPY;  Service: Endoscopy;  Laterality: N/A;   ESOPHAGOGASTRODUODENOSCOPY N/A 12/28/2022   Procedure: ESOPHAGOGASTRODUODENOSCOPY (EGD);  Surgeon: Kriss Estefana DEL, DO;  Location: [facility];  Service: Gastroenterology;  Laterality: N/A;   ESOPHAGOGASTRODUODENOSCOPY (EGD) WITH PROPOFOL  N/A 04/29/2021   Procedure: ESOPHAGOGASTRODUODENOSCOPY (EGD) WITH PROPOFOL ;  Surgeon: Dessa Reyes ORN, MD;  Location: [facility] ENDOSCOPY;  Service: Gastroenterology;  Laterality: N/A;   ESOPHAGOGASTRODUODENOSCOPY (EGD) WITH PROPOFOL  N/A 09/21/2022   Procedure:  ESOPHAGOGASTRODUODENOSCOPY (EGD) WITH PROPOFOL ;  Surgeon: Burnette Fallow, MD;  Location: [facility] ENDOSCOPY;  Service: Gastroenterology;  Laterality: N/A;   ESOPHAGOGASTRODUODENOSCOPY (EGD) WITH PROPOFOL  N/A 05/23/2023   Procedure: ESOPHAGOGASTRODUODENOSCOPY (EGD) WITH PROPOFOL ;  Surgeon: Burnette Fallow, MD;  Location: [facility] ENDOSCOPY;  Service: Gastroenterology;  Laterality: N/A;   GIVENS CAPSULE STUDY N/A 12/07/2023   Procedure: IMAGING PROCEDURE, GI TRACT, INTRALUMINAL, VIA CAPSULE;  Surgeon: Kriss Estefana DEL, DO;  Location: [facility] ENDOSCOPY;  Service: Gastroenterology;  Laterality: N/A;   IR KYPHO LUMBAR INC FX REDUCE BONE BX UNI/BIL CANNULATION INC/IMAGING  10/19/2022   IR RADIOLOGIST EVAL & MGMT  11/04/2022   REDUCTION MAMMAPLASTY Bilateral    SAVORY DILATION N/A 09/01/2015   Procedure: SAVORY DILATION;  Surgeon: Lamar ONEIDA Holmes, MD;  Location: [facility] ENDOSCOPY;  Service: Endoscopy;  Laterality: N/A;   TOTAL KNEE ARTHROPLASTY Right 10/31/2017   Procedure: RIGHT TOTAL KNEE ARTHROPLASTY;  Surgeon: Melodi Lerner, MD;  Location: [facility] ORS;  Service: Orthopedics;  Laterality: Right;    Family History  Problem Relation Age of Onset   Breast cancer Maternal Grandmother        in 42's   Heart disease Neg Hx     Social History:  reports that she has never smoked. She has never used smokeless tobacco. No history on file for alcohol use and drug use.  Allergies:  Allergies  Allergen Reactions   Sulfa Antibiotics Anaphylaxis and Swelling   Dexlansoprazole Other (See Comments)    Syncope    Ace Inhibitors Cough   Oxycodone  Nausea Only   Penicillins Rash  Has patient had a PCN reaction causing immediate rash, facial/tongue/throat swelling, SOB or lightheadedness with hypotension: No Has patient had a PCN reaction causing severe rash involving mucus membranes or skin necrosis: No Has patient had a PCN reaction that required hospitalization: No Has patient had a PCN reaction occurring within the last 10  years: No If all of the above answers are NO, then may proceed with Cephalosporin use.     Medications reviewed.     ROS Full ROS performed and is otherwise negative other than what is stated in the HPI    BP 115/75   Pulse (!) 116   Temp 98.7 F (37.1 C) (Oral)   Ht 4' 11 (1.499 m)   Wt 175 lb (79.4 kg)   SpO2 98%   BMI 35.35 kg/m   Physical Exam   Assessment/Plan:  I personally spent a total of 60 minutes in the care of the patient today including performing a medically appropriate exam/evaluation, counseling and educating, placing orders, referring and communicating with other health care professionals, documenting clinical information in the EHR, independently interpreting and reviewing images studies and coordinating care.      Laneta Luna, MD [facility] General Surgeon

## 2024-04-18 ENCOUNTER — Telehealth: Payer: Self-pay | Admitting: *Deleted

## 2024-04-18 ENCOUNTER — Other Ambulatory Visit: Payer: Self-pay | Admitting: *Deleted

## 2024-04-18 DIAGNOSIS — K449 Diaphragmatic hernia without obstruction or gangrene: Secondary | ICD-10-CM

## 2024-04-18 NOTE — Telephone Encounter (Signed)
 Faxed Cardiac Clearance to [facility]- No preference in Dr- Pt needs to establish Care and have a Echocardiogram- Surgery not yet scheduled

## 2024-04-23 ENCOUNTER — Ambulatory Visit
Admission: RE | Admit: 2024-04-23 | Discharge: 2024-04-23 | Disposition: A | Discharge status: Home / Self Care | Source: Ambulatory Visit | Attending: Surgery | Admitting: Surgery

## 2024-04-23 DIAGNOSIS — K449 Diaphragmatic hernia without obstruction or gangrene: Secondary | ICD-10-CM | POA: Diagnosis not present

## 2024-04-23 DIAGNOSIS — K219 Gastro-esophageal reflux disease without esophagitis: Secondary | ICD-10-CM | POA: Diagnosis not present

## 2024-04-23 DIAGNOSIS — K224 Dyskinesia of esophagus: Secondary | ICD-10-CM | POA: Diagnosis not present

## 2024-04-23 NOTE — Progress Notes (Signed)
 New Patient Visit    Chief Complaint: Chief Complaint  Patient presents with  . New Patient    Fatigue and weakness- Need surgical clearance   Date of Service: 04/30/2024 Date of Birth: 12/06/1940 PCP: Cleotilde Oneil Novel, MD  History of Present Illness: Ms. Cheryl Goodman is a 83 y.o.female patient who presents to establish care, due to need for pre-op clearance. PMH significant for hx of stroke, hyperlipidemia, hyperlipidemia, obesity, hx of pulmonary embolism, OSA on CPAP.  History of hyperlipidemia hypertension reflux previous kidney stone has had previous pulmonary embolus with cor pulmonale and severe RV dysfunction in the past seems to be doing much better here for routine follow-up  Today, pt presents with need for pre-op clearance. Patient states that she has an upside down stomach which causes her to have acid reflux and to cough often. Needs to lose 25 pounds before surgery. Has had significant SOB for the last two years. Order echocardiogram and lexiscan myoview for further assessment. Refer to pulmonary for further assessment of cough. Order cardiac CTA.   Past Medical and Surgical History  Past Medical History Past Medical History:  Diagnosis Date  . Acute pulmonary embolism with acute cor pulmonale (CMS/HHS-HCC) 08/10/2019   Severe RV dysfunction, left DVT, 11/20, Eliquis  started  . Arthritis   . Colon polyp   . Diverticulosis   . Gastro-esophageal reflux 05/30/2014  . Hyperlipidemia   . Hypertension   . Kidney stones   . OSA on CPAP 10/31/2018   AHI 40, RDI 19, 12/19  . Osteoporosis     Past Surgical History She has a past surgical history that includes Hysterectomy 1982; Appendectomy (1968); Cholecystectomy (2009); Reduction mammaplasty (2002); Right carpal tunnel release (08/06/2013); Colonoscopy (04/09/2004); Colonoscopy (04/29/2006); Colonoscopy (04/20/2010); Colonoscopy (01/28/2014); egd (01/28/2014); egd (09/01/2015); Replacement total knee (Right, 10/31/2017);  Colonoscopy (04/29/2021); egd (04/29/2021); and kyphoplasty.   Medications and Allergies  Current Medications Current Outpatient Medications  Medication Sig Dispense Refill  . acetaminophen  (TYLENOL ) 325 MG tablet Take 325-650 mg by mouth every 6 (six) hours    . apixaban  (ELIQUIS ) 2.5 mg tablet Take 1 tablet (2.5 mg total) by mouth once daily 90 tablet 3  . cholecalciferol , vitamin D3, (VITAMIN D3) 125 mcg (5,000 unit) tablet Take 1 tablet by mouth once daily.    . CYANOCOBALAMIN , VITAMIN B-12, ORAL Take by mouth Dissolve one daily    . diclofenac (VOLTAREN) 1 % topical gel Apply 2 g topically 4 (four) times daily 100 g 11  . iron ,carbonyl/ascorbic acid (VITRON-C ORAL) Take by mouth One daily    . pantoprazole  (PROTONIX ) 40 MG DR tablet Take 1 tablet (40 mg total) by mouth once daily 90 tablet 3  . sucralfate  (CARAFATE ) 1 gram tablet TAKE 1 TABLET (1 G TOTAL) BY MOUTH 2 (TWO) TIMES DAILY BEFORE MEALS 60 tablet 11  . tirzepatide (MOUNJARO) 2.5 mg/0.5 mL pen injector Inject 0.5 mLs (2.5 mg total) subcutaneously once a week 2 mL 5  . traMADoL  (ULTRAM ) 50 mg tablet TAKE 1 TABLET BY MOUTH TWICE A DAY 60 tablet 5  . triamterene -hydroCHLOROthiazide  (DYAZIDE ) 37.5-25 mg capsule Take 2 capsules by mouth once daily take 1 capsule by mouth every day in the morning (Patient taking differently: Take 2 capsules by mouth 2 (two) times daily) 180 capsule 3  . verapamiL (VERELAN) 120 MG SR capsule Take 1 capsule (120 mg total) by mouth at bedtime 30 capsule 11  . metoclopramide  (REGLAN ) 5 MG tablet TAKE 1 TABLET (5 MG TOTAL) BY MOUTH AT BEDTIME  FOR 30 DAYS 30 tablet 6  . metoprolol  TARTrate (LOPRESSOR ) 25 MG tablet Take 2 doses (50 mg total) by mouth at bedtime the night before your exam and take 2 doses (50 mg total) by mouth the morning of your heart CT exam. 4 tablet 0   No current facility-administered medications for this visit.    Allergies Sulfa (sulfonamide antibiotics), Dexlansoprazole, Oxycodone   myristate, Penicillamine, Penicillin, Penicillin g, Ace inhibitors, Oxycodone , and Penicillins  Social and Family History  Social History  reports that she has never smoked. She has never used smokeless tobacco. She reports that she does not drink alcohol and does not use drugs.  Family History family history includes Alzheimer's disease in her father; No Known Problems in her daughter, daughter, son, son, and son; Osteoporosis (Thinning of bones) in her mother; Pneumonia in her mother; Prostate cancer in her father; Stroke in her father and mother.   Review of Systems   Pertinent positives and negatives are mentioned above in HPI and all other systems are negative.  Physical Examination   Vitals:BP (!) 148/78 (BP Location: Left upper arm, Patient Position: Sitting, BP Cuff Size: Adult)   Pulse 88   Resp 14   Ht 147.3 cm (4' 10)   Wt 80 kg (176 lb 6.4 oz)   SpO2 97%   BMI 36.87 kg/m  Ht:147.3 cm (4' 10) Wt:80 kg (176 lb 6.4 oz) ADJ:Anib surface area is 1.81 meters squared. Body mass index is 36.87 kg/m.  HEENT: Pupils equally reactive to light and accomodation  Neck: Supple without thyromegaly, carotid pulses 2+ Lungs: clear to auscultation bilaterally; no wheezes, rales, rhonchi Heart: Regular rate and rhythm.  No gallops, murmurs or rub Abdomen: soft nontender, nondistended, with normal bowel sounds Extremities: no cyanosis, clubbing, or edema Peripheral Pulses: 2+ in all extremities, 2+ femoral pulses bilaterally Neurologic: Alert and oriented X3; speech intact; face symmetrical; moves all extremities well   Cardiovascular Studies:    Echocardiogram 2D complete: 09/22/2022 IMPRESSIONS   1. Left ventricular ejection fraction, by estimation, is 60 to 65%. The left ventricle has normal function. The left ventricle has no regional wall motion abnormalities. Left ventricular diastolic parameters are consistent with Grade I diastolic  dysfunction (impaired relaxation).   2.  Right ventricular systolic function is low normal. The right ventricular size is normal.   3. The mitral valve is grossly normal. No evidence of mitral valve regurgitation. No evidence of mitral stenosis.   4. The aortic valve is grossly normal. Aortic valve regurgitation is not visualized. No aortic stenosis is present.   5. Cannot exclude a small PFO by color doppler, however, agitated saline contrast bubble study was negative for right to left shunt.   01/27/2021 INTERPRETATION NORMAL LEFT VENTRICULAR SYSTOLIC FUNCTION with Moderate LVH, estimated EF > 55% NORMAL RIGHT VENTRICULAR SYSTOLIC FUNCTION Moderate RVE Moderate LAE Trivial Mitral, Tricuspid, and Pulmonic valve regurgitation AVA(VTI) = 2.5cm^2 No Valvular stenosis observed  NM Myocardial Perfusion SPECT multiple (stress and rest):  Cardiac Catheterization:   Holter:  Cardiac CT Scan:  Cardiac MRI:    Assessment   83 y.o. female with  1. Preoperative clearance   2. SOB (shortness of breath)   3. Cough, persistent   4. Acute CVA (cerebrovascular accident) (CMS/HHS-HCC)   5. Essential hypertension with goal blood pressure less than 140/90   6. Hyperlipidemia, mixed   7. OSA on CPAP   8. DM type 2 with diabetic mixed hyperlipidemia  (CMS/HHS-HCC)   9. Severe obesity (BMI 35.0-35.9  with comorbidity) (CMS/HHS-HCC)   10. Gastroesophageal reflux disease, unspecified whether esophagitis present    Plan   Pre-op clearance, will order echocardiogram, lexiscan myoview, and cardiac CTA for further assessment  SOB, order echocardiogram, lexiscan myoview, cardiac CTA for further assessment  Cough, refer to pulmonary for further assessment  Hx of CVA, mini stroke around 5 years ago, continue verapamil, triamterene -HCTZ Hypertension, today's BP was 148/78, continue verapamil, triamterene -HCTZ Hyperlipidemia, consider statin therapy for lipid management, recommend diet and exercise  OSA, does not have sleep apnea based on  assessment around two years ago Diabetes, continue Mounjaro  Obesity, recommend weight loss, exercise, and portion control GERD, continue pantoprazole  therapy for reflux type symptoms    Return in about 1 year (around 04/30/2025).  This note is partially written by Leita Ellen, in the presence of and acting as the scribe of Dr. Cara Lovelace.  Leita Ellen  I have reviewed, edited and added to the note to reflect my best personal medical judgment.  Attestation Statement:   I personally performed the service. (TP)  DWAYNE JONETTA LOVELACE, MD  [facility] [place], [place] Ph:  [phone] Fax:  [phone] This note was generated in part with voice recognition software, Dragon.  I apologize for any typographical errors that were not detected and corrected from this process.  They are unintentional.

## 2024-04-27 DIAGNOSIS — M1712 Unilateral primary osteoarthritis, left knee: Secondary | ICD-10-CM | POA: Diagnosis not present

## 2024-04-27 DIAGNOSIS — E119 Type 2 diabetes mellitus without complications: Secondary | ICD-10-CM | POA: Diagnosis not present

## 2024-04-27 DIAGNOSIS — E663 Overweight: Secondary | ICD-10-CM | POA: Diagnosis not present

## 2024-04-27 DIAGNOSIS — K449 Diaphragmatic hernia without obstruction or gangrene: Secondary | ICD-10-CM | POA: Diagnosis not present

## 2024-04-27 DIAGNOSIS — Z6835 Body mass index (BMI) 35.0-35.9, adult: Secondary | ICD-10-CM | POA: Diagnosis not present

## 2024-04-27 DIAGNOSIS — E1169 Type 2 diabetes mellitus with other specified complication: Secondary | ICD-10-CM | POA: Diagnosis present

## 2024-04-30 DIAGNOSIS — R053 Chronic cough: Secondary | ICD-10-CM | POA: Diagnosis not present

## 2024-04-30 DIAGNOSIS — I639 Cerebral infarction, unspecified: Secondary | ICD-10-CM | POA: Diagnosis not present

## 2024-04-30 DIAGNOSIS — I10 Essential (primary) hypertension: Secondary | ICD-10-CM | POA: Diagnosis not present

## 2024-04-30 DIAGNOSIS — Z01818 Encounter for other preprocedural examination: Secondary | ICD-10-CM | POA: Diagnosis not present

## 2024-04-30 DIAGNOSIS — R0602 Shortness of breath: Secondary | ICD-10-CM | POA: Diagnosis not present

## 2024-04-30 DIAGNOSIS — E1169 Type 2 diabetes mellitus with other specified complication: Secondary | ICD-10-CM | POA: Diagnosis not present

## 2024-04-30 DIAGNOSIS — G4733 Obstructive sleep apnea (adult) (pediatric): Secondary | ICD-10-CM | POA: Diagnosis not present

## 2024-04-30 DIAGNOSIS — E782 Mixed hyperlipidemia: Secondary | ICD-10-CM | POA: Diagnosis not present

## 2024-05-02 ENCOUNTER — Ambulatory Visit: Admitting: Surgery

## 2024-05-04 ENCOUNTER — Other Ambulatory Visit: Payer: Self-pay | Admitting: Internal Medicine

## 2024-05-04 DIAGNOSIS — R0602 Shortness of breath: Secondary | ICD-10-CM

## 2024-05-04 DIAGNOSIS — Z01818 Encounter for other preprocedural examination: Secondary | ICD-10-CM

## 2024-05-07 DIAGNOSIS — R0602 Shortness of breath: Secondary | ICD-10-CM | POA: Diagnosis not present

## 2024-05-08 DIAGNOSIS — R062 Wheezing: Secondary | ICD-10-CM | POA: Diagnosis not present

## 2024-05-08 DIAGNOSIS — K219 Gastro-esophageal reflux disease without esophagitis: Secondary | ICD-10-CM | POA: Diagnosis not present

## 2024-05-08 DIAGNOSIS — K449 Diaphragmatic hernia without obstruction or gangrene: Secondary | ICD-10-CM | POA: Diagnosis not present

## 2024-05-08 DIAGNOSIS — R0602 Shortness of breath: Secondary | ICD-10-CM | POA: Diagnosis not present

## 2024-05-17 ENCOUNTER — Other Ambulatory Visit: Payer: Self-pay | Admitting: Emergency Medicine

## 2024-05-17 DIAGNOSIS — R0602 Shortness of breath: Secondary | ICD-10-CM

## 2024-05-17 DIAGNOSIS — K219 Gastro-esophageal reflux disease without esophagitis: Secondary | ICD-10-CM

## 2024-06-06 ENCOUNTER — Ambulatory Visit: Admitting: Surgery

## 2024-06-20 DIAGNOSIS — Z6835 Body mass index (BMI) 35.0-35.9, adult: Secondary | ICD-10-CM | POA: Diagnosis not present

## 2024-06-20 DIAGNOSIS — N183 Chronic kidney disease, stage 3 unspecified: Secondary | ICD-10-CM | POA: Diagnosis not present

## 2024-06-20 DIAGNOSIS — E669 Obesity, unspecified: Secondary | ICD-10-CM | POA: Diagnosis not present

## 2024-06-25 ENCOUNTER — Ambulatory Visit: Payer: Self-pay | Admitting: Surgery

## 2024-06-25 DIAGNOSIS — Z86711 Personal history of pulmonary embolism: Secondary | ICD-10-CM | POA: Diagnosis not present

## 2024-06-25 DIAGNOSIS — E66812 Obesity, class 2: Secondary | ICD-10-CM | POA: Diagnosis not present

## 2024-06-25 DIAGNOSIS — D500 Iron deficiency anemia secondary to blood loss (chronic): Secondary | ICD-10-CM | POA: Diagnosis not present

## 2024-06-25 DIAGNOSIS — K440 Diaphragmatic hernia with obstruction, without gangrene: Secondary | ICD-10-CM | POA: Diagnosis not present

## 2024-06-25 DIAGNOSIS — Z7901 Long term (current) use of anticoagulants: Secondary | ICD-10-CM

## 2024-06-25 DIAGNOSIS — K3189 Other diseases of stomach and duodenum: Secondary | ICD-10-CM | POA: Diagnosis present

## 2024-06-25 DIAGNOSIS — K257 Chronic gastric ulcer without hemorrhage or perforation: Secondary | ICD-10-CM | POA: Diagnosis not present

## 2024-06-25 DIAGNOSIS — Z8673 Personal history of transient ischemic attack (TIA), and cerebral infarction without residual deficits: Secondary | ICD-10-CM | POA: Diagnosis not present

## 2024-07-04 ENCOUNTER — Other Ambulatory Visit: Payer: Self-pay | Admitting: Gastroenterology

## 2024-07-04 ENCOUNTER — Other Ambulatory Visit: Payer: Self-pay

## 2024-07-04 DIAGNOSIS — K449 Diaphragmatic hernia without obstruction or gangrene: Secondary | ICD-10-CM | POA: Diagnosis not present

## 2024-07-18 NOTE — Progress Notes (Addendum)
 Anesthesia Review:  PCP: Oneil Pinal LVO 04/27/24  Cardiologist :Shirlie Callwood LVO 04/30/24 preop clearance    PPM/ ICD: Device Orders: Rep Notified:  Chest x-ray : 1v- 12/06/23 , 03/22/24- [facility]  EKG : 12/09/23 and 03/22/24- [facility]  Echo : 2023 , 05/07/24- Echo - [facility]  Stress test: 05/07/24  Cardiac Cath :  PFT- 05/08/24   Activity level: can do a flight of stairs without diffficulty  Sleep Study/ CPAP : none  Fasting Blood Sugar :      / Checks Blood Sugar -- times a day:    Blood Thinner/ Instructions /Last Dose: ASA / Instructions/ Last Dose :  Eliquis - last dose on 07/29/24    Pt states at preop she was told she would be staying 1-2 days in hospital.  Listed as Ambulatory surgery.  Called [facility] Triage and made them aware and they will let DR Gross be aware.   PT has Hearing Aids  Pt has Incontinence - wears pads.    BMp done 07/24/24  with Creatinie of 1.75 routed to DR Gross on 07/24/24.

## 2024-07-20 NOTE — Patient Instructions (Signed)
 SURGICAL WAITING ROOM VISITATION  Patients having surgery or a procedure may have no more than 2 support people in the waiting area - these visitors may rotate.    Children under the age of 48 must have an adult with them who is not the patient.  Visitors with respiratory illnesses are discouraged from visiting and should remain at home.  If the patient needs to stay at the hospital during part of their recovery, the visitor guidelines for inpatient rooms apply. Pre-op nurse will coordinate an appropriate time for 1 support person to accompany patient in pre-op.  This support person may not rotate.    Please refer to the [facility] website for the visitor guidelines for Inpatients (after your surgery is over and you are in a regular room).       Your procedure is scheduled on:  08/01/24    Report to [facility] Main Entrance    Report to admitting at   [phone]   Call this number if you have problems the morning of surgery [phone]   Do not eat food :After Midnight.   After Midnight you may have the following liquids until _ 0845_____ A m  DAY OF SURGERY  Water Non-Citrus Juices (without pulp, NO RED-Apple, White grape, White cranberry) Black Coffee (NO MILK/CREAM OR CREAMERS, sugar ok)  Clear Tea (NO MILK/CREAM OR CREAMERS, sugar ok) regular and decaf                             Plain Jell-O (NO RED)                                           Fruit ices (not with fruit pulp, NO RED)                                     Popsicles (NO RED)                                                               Sports drinks like Gatorade (NO RED)              Drink 2 Ensure/G2 drinks AT 10:00 PM the night before surgery.        The day of surgery:  Drink ONE (1) Pre-Surgery Clear Ensure or G2 at 0845 AM the morning of surgery. Drink in one sitting. Do not sip.  This drink was given to you during your hospital  pre-op appointment visit. Nothing else to drink after completing  the  Pre-Surgery Clear Ensure or G2.          If you have questions, please contact your surgeon's office.      Oral Hygiene is also important to reduce your risk of infection.                                    Remember - BRUSH YOUR TEETH THE MORNING OF SURGERY WITH YOUR REGULAR TOOTHPASTE  DENTURES WILL  BE REMOVED PRIOR TO SURGERY PLEASE DO NOT APPLY Poly grip OR ADHESIVES!!!   Do NOT smoke after Midnight   Stop all vitamins and herbal supplements 7 days before surgery.   Take these medicines the morning of surgery with A SIP OF WATER:  protonix   DO NOT TAKE ANY ORAL DIABETIC MEDICATIONS DAY OF YOUR SURGERY  Bring CPAP mask and tubing day of surgery.                              You may not have any metal on your body including hair pins, jewelry, and body piercing             Do not wear make-up, lotions, powders, perfumes/cologne, or deodorant  Do not wear nail polish including gel and S&S, artificial/acrylic nails, or any other type of covering on natural nails including finger and toenails. If you have artificial nails, gel coating, etc. that needs to be removed by a nail salon please have this removed prior to surgery or surgery may need to be canceled/ delayed if the surgeon/ anesthesia feels like they are unable to be safely monitored.   Do not shave  48 hours prior to surgery.               Men may shave face and neck.   Do not bring valuables to the hospital. [facility] IS NOT             RESPONSIBLE   FOR VALUABLES.   Contacts, glasses, dentures or bridgework may not be worn into surgery.   Bring small overnight bag day of surgery.   DO NOT BRING YOUR HOME MEDICATIONS TO THE HOSPITAL. PHARMACY WILL DISPENSE MEDICATIONS LISTED ON YOUR MEDICATION LIST TO YOU DURING YOUR ADMISSION IN THE HOSPITAL!    Patients discharged on the day of surgery will not be allowed to drive home.  Someone NEEDS to stay with you for the first 24 hours after anesthesia.   Special  Instructions: Bring a copy of your healthcare power of attorney and living will documents the day of surgery if you haven't scanned them before.              Please read over the following fact sheets you were given: IF YOU HAVE QUESTIONS ABOUT YOUR PRE-OP INSTRUCTIONS PLEASE CALL [phone].   If you received a COVID test during your pre-op visit  it is requested that you wear a mask when out in public, stay away from anyone that may not be feeling well and notify your surgeon if you develop symptoms. If you test positive for Covid or have been in contact with anyone that has tested positive in the last 10 days please notify you surgeon.    [place] - Preparing for Surgery Before surgery, you can play an important role.  Because skin is not sterile, your skin needs to be as free of germs as possible.  You can reduce the number of germs on your skin by washing with CHG (chlorahexidine gluconate) soap before surgery.  CHG is an antiseptic cleaner which kills germs and bonds with the skin to continue killing germs even after washing. Please DO NOT use if you have an allergy to CHG or antibacterial soaps.  If your skin becomes reddened/irritated stop using the CHG and inform your nurse when you arrive at Short Stay. Do not shave (including legs and underarms) for at least 48 hours prior  to the first CHG shower.  You may shave your face/neck.  Please follow these instructions carefully:  1.  Shower with CHG Soap the night before surgery ONLY (DO NOT USE THE SOAP THE MORNING OF SURGERY).  2.  If you choose to wash your hair, wash your hair first as usual with your normal  shampoo.  3.  After you shampoo, rinse your hair and body thoroughly to remove the shampoo.                             4.  Use CHG as you would any other liquid soap.  You can apply chg directly to the skin and wash.  Gently with a scrungie or clean washcloth.  5.  Apply the CHG Soap to your body ONLY FROM THE NECK DOWN.   Do   not  use on face/ open                           Wound or open sores. Avoid contact with eyes, ears mouth and   genitals (private parts).                       Wash face,  Genitals (private parts) with your normal soap.             6.  Wash thoroughly, paying special attention to the area where your    surgery  will be performed.  7.  Thoroughly rinse your body with warm water from the neck down.  8.  DO NOT shower/wash with your normal soap after using and rinsing off the CHG Soap.                9.  Pat yourself dry with a clean towel.            10.  Wear clean pajamas.            11.  Place clean sheets on your bed the night of your first shower and do not  sleep with pets. Day of Surgery : Do not apply any CHG, lotions/deodorants the morning of surgery.  Please wear clean clothes to the hospital/surgery center.  FAILURE TO FOLLOW THESE INSTRUCTIONS MAY RESULT IN THE CANCELLATION OF YOUR SURGERY  PATIENT SIGNATURE_________________________________  NURSE SIGNATURE__________________________________  ________________________________________________________________________

## 2024-07-24 ENCOUNTER — Other Ambulatory Visit: Payer: Self-pay

## 2024-07-24 ENCOUNTER — Encounter (HOSPITAL_COMMUNITY)
Admission: RE | Admit: 2024-07-24 | Discharge: 2024-07-24 | Disposition: A | Discharge status: Home / Self Care | Source: Ambulatory Visit | Attending: Surgery | Admitting: Surgery

## 2024-07-24 ENCOUNTER — Encounter (HOSPITAL_COMMUNITY): Payer: Self-pay

## 2024-07-24 VITALS — BP 133/80 | HR 69 | Temp 98.1°F | Resp 16 | Ht <= 58 in | Wt 169.0 lb

## 2024-07-24 DIAGNOSIS — M1990 Unspecified osteoarthritis, unspecified site: Secondary | ICD-10-CM | POA: Diagnosis not present

## 2024-07-24 DIAGNOSIS — I129 Hypertensive chronic kidney disease with stage 1 through stage 4 chronic kidney disease, or unspecified chronic kidney disease: Secondary | ICD-10-CM | POA: Diagnosis not present

## 2024-07-24 DIAGNOSIS — N183 Chronic kidney disease, stage 3 unspecified: Secondary | ICD-10-CM | POA: Diagnosis not present

## 2024-07-24 DIAGNOSIS — Z7901 Long term (current) use of anticoagulants: Secondary | ICD-10-CM | POA: Diagnosis not present

## 2024-07-24 DIAGNOSIS — K449 Diaphragmatic hernia without obstruction or gangrene: Secondary | ICD-10-CM | POA: Diagnosis not present

## 2024-07-24 DIAGNOSIS — K219 Gastro-esophageal reflux disease without esophagitis: Secondary | ICD-10-CM | POA: Diagnosis not present

## 2024-07-24 DIAGNOSIS — Z8673 Personal history of transient ischemic attack (TIA), and cerebral infarction without residual deficits: Secondary | ICD-10-CM | POA: Diagnosis not present

## 2024-07-24 DIAGNOSIS — Z01812 Encounter for preprocedural laboratory examination: Secondary | ICD-10-CM | POA: Insufficient documentation

## 2024-07-24 DIAGNOSIS — Z6835 Body mass index (BMI) 35.0-35.9, adult: Secondary | ICD-10-CM | POA: Diagnosis not present

## 2024-07-24 DIAGNOSIS — Z86711 Personal history of pulmonary embolism: Secondary | ICD-10-CM | POA: Insufficient documentation

## 2024-07-24 DIAGNOSIS — E669 Obesity, unspecified: Secondary | ICD-10-CM | POA: Insufficient documentation

## 2024-07-24 DIAGNOSIS — Z01818 Encounter for other preprocedural examination: Secondary | ICD-10-CM

## 2024-07-24 HISTORY — DX: Edema, unspecified: R60.9

## 2024-07-24 HISTORY — DX: Pneumonia, unspecified organism: J18.9

## 2024-07-24 LAB — CBC
HCT: 39.2 % (ref 36.0–46.0)
Hemoglobin: 11.9 g/dL — ABNORMAL LOW (ref 12.0–15.0)
MCH: 29.2 pg (ref 26.0–34.0)
MCHC: 30.4 g/dL (ref 30.0–36.0)
MCV: 96.3 fL (ref 80.0–100.0)
Platelets: 327 K/uL (ref 150–400)
RBC: 4.07 MIL/uL (ref 3.87–5.11)
RDW: 14.6 % (ref 11.5–15.5)
WBC: 10.6 K/uL — ABNORMAL HIGH (ref 4.0–10.5)
nRBC: 0 % (ref 0.0–0.2)

## 2024-07-24 LAB — BASIC METABOLIC PANEL WITH GFR
Anion gap: 11 (ref 5–15)
BUN: 26 mg/dL — ABNORMAL HIGH (ref 8–23)
CO2: 29 mmol/L (ref 22–32)
Calcium: 9.8 mg/dL (ref 8.9–10.3)
Chloride: 102 mmol/L (ref 98–111)
Creatinine, Ser: 1.75 mg/dL — ABNORMAL HIGH (ref 0.44–1.00)
GFR, Estimated: 28 mL/min — ABNORMAL LOW (ref >60)
Glucose, Bld: 103 mg/dL — ABNORMAL HIGH (ref 70–99)
Potassium: 4.1 mmol/L (ref 3.5–5.1)
Sodium: 141 mmol/L (ref 135–145)

## 2024-07-25 ENCOUNTER — Encounter (HOSPITAL_COMMUNITY): Payer: Self-pay

## 2024-07-25 NOTE — Progress Notes (Addendum)
 Case: [id number] Date/Time: 08/01/24 1136   Procedure: REPAIR, HERNIA, PARAESOPHAGEAL, ROBOT-ASSISTED   Anesthesia type: General   Pre-op diagnosis: PARAESOPHAGEAL HIATAL HERNIA REFRACTORY TO MEDICAL MANAGEMENT   Location: [facility] ROOM 02 / [facility]   Surgeons: Sheldon Standing, MD       DISCUSSION: Cheryl Goodman is an 83 yo female with PMH of HTN, hx of PE on Eliquis , hx of CVA (~2020), GERD, large hiatal hernia, CKD3, arthritis, obesity.  Patient referred to Cardiology at [facility] for pre op clearance. Seen on 04/30/24. Pt reported chronic SOB and cough. Echo and stress test were ordered. Echo showed normal LVEF, grade I DD, no significant valvular disease. Stress test was low risk without ischemia. She was referred to Pulmonology due to ongoing symptoms.  Patient referred to Pulmonology at [facility] and was seen on 05/08/24 after negative cardiac eval for her SOB and cough. PFTs and CT Chest ordered. PFTs not viewable in epic and CT Chest scheduled for 08/08/24. Pt prescribed Z pak and prednisone.   Seen by PCP on 06/20/24 for pre op exam. Per Dr. Cleotilde: Severe paraesophageal hernia-needs surgery, recent cardiac evaluation negative, surgery important, low surgical risk.  LD Eliquis : 10/26  VS: BP 133/80   Pulse 69   Temp 36.7 C (Oral)   Resp 16   Ht 4' 10 (1.473 m)   Wt 76.7 kg   SpO2 100%   BMI 35.32 kg/m   PROVIDERS: Cleotilde Oneil FALCON, MD   LABS: Labs reviewed: Acceptable for surgery. (all labs ordered are listed, but only abnormal results are displayed)  Labs Reviewed  BASIC METABOLIC PANEL WITH GFR - Abnormal; Notable for the following components:      Result Value   Glucose, Bld 103 (*)    BUN 26 (*)    Creatinine, Ser 1.75 (*)    GFR, Estimated 28 (*)    All other components within normal limits  CBC - Abnormal; Notable for the following components:   WBC 10.6 (*)    Hemoglobin 11.9 (*)    All other components within normal limits     Echo 05/07/24 ([facility]):  CONCLUSION  ------------------------------------------------------------------------------- NORMAL LEFT VENTRICULAR SYSTOLIC FUNCTION WITH NO LVH ESTIMATED EF: >55% NORMAL LA PRESSURES WITH DIASTOLIC DYSFUNCTION (GRADE 1) NORMAL RIGHT VENTRICULAR SYSTOLIC FUNCTION VALVULAR REGURGITATION: No AR, TRIVIAL MR, TRIVIAL PR, TRIVIAL TR ESTIMATED RVSP: 22 mmHg (Normal) NO VALVULAR STENOSIS  Stress test 05/07/24 ([facility]):   FINDINGS: Regional wall motion:  reveals normal myocardial thickening and wall motion. The overall quality of the study is good.   Artifacts noted: no Left ventricular cavity: normal.  Past Medical History:  Diagnosis Date   Acute pulmonary embolism with acute cor pulmonale (HCC) 08/10/2019   Arthritis    Dysphagia    Edema    in legs   GERD (gastroesophageal reflux disease)    History of hiatal hernia    History of kidney stones    x2 ; passed independently   Hyperlipidemia    Hypertension    Nausea    OA (osteoarthritis) of knee 10/31/2017   Pneumonia    Stroke [facility])    mini stroke    Past Surgical History:  Procedure Laterality Date   ABDOMINAL HYSTERECTOMY     APPENDECTOMY     BIOPSY  12/28/2022   Procedure: BIOPSY;  Surgeon: Kriss Estefana DEL, DO;  [facility]: [facility];  Service: Gastroenterology;;   BREAST BIOPSY Right    BREAST SURGERY     CARPAL TUNNEL RELEASE  CHOLECYSTECTOMY     COLONOSCOPY WITH PROPOFOL  N/A 09/01/2015   Procedure: COLONOSCOPY WITH PROPOFOL ;  Surgeon: Lamar ONEIDA Holmes, MD;  Location: [facility] ENDOSCOPY;  Service: Endoscopy;  Laterality: N/A;   COLONOSCOPY WITH PROPOFOL  N/A 04/29/2021   Procedure: COLONOSCOPY WITH PROPOFOL ;  Surgeon: Dessa Reyes ORN, MD;  Location: [facility] ENDOSCOPY;  Service: Gastroenterology;  Laterality: N/A;   ESOPHAGOGASTRODUODENOSCOPY N/A 07/17/2019   Procedure: ESOPHAGOGASTRODUODENOSCOPY (EGD);  Surgeon: Janalyn Keene NOVAK, MD;  Location: [facility] ENDOSCOPY;  Service: Endoscopy;  Laterality: N/A;    ESOPHAGOGASTRODUODENOSCOPY N/A 12/28/2022   Procedure: ESOPHAGOGASTRODUODENOSCOPY (EGD);  Surgeon: Kriss Estefana DEL, DO;  Location: [facility];  Service: Gastroenterology;  Laterality: N/A;   ESOPHAGOGASTRODUODENOSCOPY (EGD) WITH PROPOFOL  N/A 04/29/2021   Procedure: ESOPHAGOGASTRODUODENOSCOPY (EGD) WITH PROPOFOL ;  Surgeon: Dessa Reyes ORN, MD;  Location: [facility] ENDOSCOPY;  Service: Gastroenterology;  Laterality: N/A;   ESOPHAGOGASTRODUODENOSCOPY (EGD) WITH PROPOFOL  N/A 09/21/2022   Procedure: ESOPHAGOGASTRODUODENOSCOPY (EGD) WITH PROPOFOL ;  Surgeon: Burnette Fallow, MD;  [facility];  Service: Gastroenterology;  Laterality: N/A;   ESOPHAGOGASTRODUODENOSCOPY (EGD) WITH PROPOFOL  N/A 05/23/2023   Procedure: ESOPHAGOGASTRODUODENOSCOPY (EGD) WITH PROPOFOL ;  Surgeon: Burnette Fallow, MD;  [facility];  Service: Gastroenterology;  Laterality: N/A;   GIVENS CAPSULE STUDY N/A 12/07/2023   Procedure: IMAGING PROCEDURE, GI TRACT, INTRALUMINAL, VIA CAPSULE;  Surgeon: Kriss Estefana DEL, DO;  [facility];  Service: Gastroenterology;  Laterality: N/A;   IR KYPHO LUMBAR INC FX REDUCE BONE BX UNI/BIL CANNULATION INC/IMAGING  10/19/2022   IR RADIOLOGIST EVAL & MGMT  11/04/2022   KYPHOPLASTY     in lower back   REDUCTION MAMMAPLASTY Bilateral    SAVORY DILATION N/A 09/01/2015   Procedure: SAVORY DILATION;  Surgeon: Lamar ONEIDA Holmes, MD;  Location: [facility] ENDOSCOPY;  Service: Endoscopy;  Laterality: N/A;   TOTAL KNEE ARTHROPLASTY Right 10/31/2017   Procedure: RIGHT TOTAL KNEE ARTHROPLASTY;  Surgeon: Melodi Lerner, MD;  [facility];  Service: Orthopedics;  Laterality: Right;    MEDICATIONS:  acetaminophen  (TYLENOL ) 325 MG tablet   apixaban  (ELIQUIS ) 2.5 MG TABS tablet   Iron -Vitamin C (VITRON-C) 65-125 MG TABS   MAGNESIUM PO   PROLIA  60 MG/ML SOSY injection   sucralfate  (CARAFATE ) 1 g tablet   torsemide  (DEMADEX ) 5 MG tablet   traMADol  (ULTRAM ) 50 MG tablet    triamterene -hydrochlorothiazide  (DYAZIDE ) 37.5-25 MG capsule   verapamil (VERELAN) 120 MG 24 hr capsule   No current facility-administered medications for this encounter.   Burnard CHRISTELLA Odis DEVONNA [facility] Short Stay/Anesthesiology [facility] Phone [phone] 07/25/2024 2:54 PM

## 2024-07-31 ENCOUNTER — Encounter (HOSPITAL_COMMUNITY): Payer: Self-pay | Admitting: Surgery

## 2024-07-31 NOTE — Anesthesia Preprocedure Evaluation (Signed)
 Anesthesia Evaluation  Patient identified by MRN, date of birth, ID band Patient awake    Reviewed: Allergy & Precautions, NPO status , Patient's Chart, lab work & pertinent test results, reviewed documented beta blocker date and time   Airway Mallampati: II  TM Distance: >3 FB     Dental  (+) Dental Advisory Given   Pulmonary sleep apnea and Continuous Positive Airway Pressure Ventilation , pneumonia, resolved, PE Hx/o PTE 2020   Pulmonary exam normal breath sounds clear to auscultation       Cardiovascular hypertension, Pt. on medications Normal cardiovascular exam Rhythm:Regular Rate:Normal  Echo 08/02/19 1. The RV is severely dilated with severely reduced function. There is  septal flattening consistent with RV pressure overload. There is RV apical  hyperkinesis consistent with McConnell's sign. This is consistent with  acute cor pulmonale in the setting  of submassive PE. Findings communicated to the primary team.   2. Left ventricular ejection fraction, by visual estimation, is 60 to  65%. The left ventricle has normal function. There is borderline left  ventricular hypertrophy.   3. Left ventricular diastolic function could not be evaluated.   4. Right ventricular pressure overload.   5. Global right ventricle has severely reduced systolic function.The  right ventricular size is severely enlarged. No increase in right  ventricular wall thickness.   6. Left atrial size was normal.   7. Right atrial size was severely dilated.   8. Presence of pericardial fat pad.   9. Trivial pericardial effusion is present.  10. Mild mitral annular calcification.  11. The mitral valve is degenerative. No evidence of mitral valve  regurgitation.  12. The tricuspid valve is grossly normal. Tricuspid valve regurgitation  is trivial.  13. The aortic valve is tricuspid. Aortic valve regurgitation is not  visualized. Mild aortic valve  sclerosis without stenosis.  14. The pulmonic valve was grossly normal. Pulmonic valve regurgitation is  not visualized.  15. Mildly elevated pulmonary artery systolic pressure.   EKG 12/06/23 Sinus rhythm Right axis deviation Low voltage, precordial leads   Neuro/Psych CVA, No Residual Symptoms  negative psych ROS   GI/Hepatic Neg liver ROS, hiatal hernia,GERD  Medicated,,Hx/o GI Bleed   Endo/Other    Renal/GU Renal InsufficiencyRenal diseaseHx/o renal calculi  negative genitourinary   Musculoskeletal  (+) Arthritis , Osteoarthritis,    Abdominal  (+) + obese  Peds  Hematology  (+) Blood dyscrasia, anemia Eliquis  therapy- last dose   Anesthesia Other Findings   Reproductive/Obstetrics                              Anesthesia Physical Anesthesia Plan  ASA: 3  Anesthesia Plan: General   Post-op Pain Management: Dilaudid  IV, Precedex and Ofirmev  IV (intra-op)*   Induction: Intravenous, Rapid sequence and Cricoid pressure planned  PONV Risk Score and Plan: 4 or greater and Treatment may vary due to age or medical condition, Ondansetron , TIVA, Propofol  infusion and Dexamethasone   Airway Management Planned: Oral ETT  Additional Equipment: None  Intra-op Plan:   Post-operative Plan: Extubation in OR  Informed Consent: I have reviewed the patients History and Physical, chart, labs and discussed the procedure including the risks, benefits and alternatives for the proposed anesthesia with the patient or authorized representative who has indicated his/her understanding and acceptance.     Dental advisory given  Plan Discussed with: CRNA and Anesthesiologist  Anesthesia Plan Comments:  Anesthesia Quick Evaluation

## 2024-08-01 ENCOUNTER — Ambulatory Visit (HOSPITAL_COMMUNITY): Payer: Self-pay | Admitting: Medical

## 2024-08-01 ENCOUNTER — Encounter (HOSPITAL_COMMUNITY): Admission: RE | Disposition: A | Payer: Self-pay | Source: Home / Self Care | Attending: Surgery

## 2024-08-01 ENCOUNTER — Other Ambulatory Visit: Payer: Self-pay

## 2024-08-01 ENCOUNTER — Ambulatory Visit (HOSPITAL_BASED_OUTPATIENT_CLINIC_OR_DEPARTMENT_OTHER): Payer: Self-pay | Admitting: Anesthesiology

## 2024-08-01 ENCOUNTER — Observation Stay (HOSPITAL_COMMUNITY)
Admission: RE | Admit: 2024-08-01 | Discharge: 2024-08-03 | Disposition: A | Discharge status: Home Health | Attending: Surgery | Admitting: Surgery

## 2024-08-01 ENCOUNTER — Encounter (HOSPITAL_COMMUNITY): Payer: Self-pay | Admitting: Surgery

## 2024-08-01 DIAGNOSIS — Z79899 Other long term (current) drug therapy: Secondary | ICD-10-CM | POA: Diagnosis not present

## 2024-08-01 DIAGNOSIS — E66812 Obesity, class 2: Secondary | ICD-10-CM | POA: Insufficient documentation

## 2024-08-01 DIAGNOSIS — Z86711 Personal history of pulmonary embolism: Secondary | ICD-10-CM | POA: Diagnosis not present

## 2024-08-01 DIAGNOSIS — K440 Diaphragmatic hernia with obstruction, without gangrene: Principal | ICD-10-CM | POA: Insufficient documentation

## 2024-08-01 DIAGNOSIS — I10 Essential (primary) hypertension: Secondary | ICD-10-CM

## 2024-08-01 DIAGNOSIS — N1831 Chronic kidney disease, stage 3a: Secondary | ICD-10-CM | POA: Diagnosis not present

## 2024-08-01 DIAGNOSIS — M6281 Muscle weakness (generalized): Secondary | ICD-10-CM | POA: Insufficient documentation

## 2024-08-01 DIAGNOSIS — Z6835 Body mass index (BMI) 35.0-35.9, adult: Secondary | ICD-10-CM | POA: Insufficient documentation

## 2024-08-01 DIAGNOSIS — E1122 Type 2 diabetes mellitus with diabetic chronic kidney disease: Secondary | ICD-10-CM | POA: Insufficient documentation

## 2024-08-01 DIAGNOSIS — K257 Chronic gastric ulcer without hemorrhage or perforation: Secondary | ICD-10-CM | POA: Diagnosis present

## 2024-08-01 DIAGNOSIS — I129 Hypertensive chronic kidney disease with stage 1 through stage 4 chronic kidney disease, or unspecified chronic kidney disease: Secondary | ICD-10-CM | POA: Insufficient documentation

## 2024-08-01 DIAGNOSIS — G4733 Obstructive sleep apnea (adult) (pediatric): Secondary | ICD-10-CM | POA: Insufficient documentation

## 2024-08-01 DIAGNOSIS — K3189 Other diseases of stomach and duodenum: Secondary | ICD-10-CM | POA: Diagnosis present

## 2024-08-01 DIAGNOSIS — K254 Chronic or unspecified gastric ulcer with hemorrhage: Secondary | ICD-10-CM | POA: Diagnosis not present

## 2024-08-01 DIAGNOSIS — Z7901 Long term (current) use of anticoagulants: Secondary | ICD-10-CM | POA: Diagnosis not present

## 2024-08-01 DIAGNOSIS — K449 Diaphragmatic hernia without obstruction or gangrene: Secondary | ICD-10-CM | POA: Diagnosis not present

## 2024-08-01 DIAGNOSIS — Z8673 Personal history of transient ischemic attack (TIA), and cerebral infarction without residual deficits: Secondary | ICD-10-CM | POA: Insufficient documentation

## 2024-08-01 DIAGNOSIS — E1169 Type 2 diabetes mellitus with other specified complication: Secondary | ICD-10-CM | POA: Diagnosis present

## 2024-08-01 DIAGNOSIS — Z01818 Encounter for other preprocedural examination: Secondary | ICD-10-CM

## 2024-08-01 HISTORY — PX: XI ROBOTIC ASSISTED PARAESOPHAGEAL HERNIA REPAIR: SHX6871

## 2024-08-01 SURGERY — REPAIR, HERNIA, PARAESOPHAGEAL, ROBOT-ASSISTED
Anesthesia: General

## 2024-08-01 MED ORDER — KCL IN DEXTROSE-NACL 20-5-0.45 MEQ/L-%-% IV SOLN
INTRAVENOUS | Status: DC
  Filled 2024-08-01: qty 1000

## 2024-08-01 MED ORDER — ENSURE PRE-SURGERY PO LIQD: 296.0000 mL | Freq: Once | ORAL | Status: DC

## 2024-08-01 MED ORDER — ALUM & MAG HYDROXIDE-SIMETH 200-200-20 MG/5ML PO SUSP: 30.0000 mL | Freq: Four times a day (QID) | ORAL | Status: DC | PRN

## 2024-08-01 MED ORDER — SALINE SPRAY 0.65 % NA SOLN: 1.0000 | Freq: Four times a day (QID) | NASAL | Status: DC | PRN

## 2024-08-01 MED ORDER — ONDANSETRON 4 MG PO TBDP: 4.0000 mg | ORAL_TABLET | Freq: Four times a day (QID) | ORAL | Status: DC | PRN

## 2024-08-01 MED ORDER — SODIUM CHLORIDE (PF) 0.9 % IJ SOLN
INTRAMUSCULAR | Status: AC
  Filled 2024-08-01: qty 40

## 2024-08-01 MED ORDER — SIMETHICONE 80 MG PO CHEW: 40.0000 mg | CHEWABLE_TABLET | Freq: Four times a day (QID) | ORAL | Status: DC | PRN

## 2024-08-01 MED ORDER — HYDROMORPHONE HCL 1 MG/ML IJ SOLN: 0.2500 mg | INTRAMUSCULAR | Status: DC | PRN

## 2024-08-01 MED ORDER — ACETAMINOPHEN 500 MG PO TABS
1000.0000 mg | ORAL_TABLET | Freq: Four times a day (QID) | ORAL | Status: DC
Start: 2024-08-01 — End: 2024-08-03
  Administered 2024-08-01 – 2024-08-03 (×6): 1000 mg via ORAL
  Filled 2024-08-01 (×6): qty 2

## 2024-08-01 MED ORDER — FERROUS SULFATE 325 (65 FE) MG PO TABS
325.0000 mg | ORAL_TABLET | Freq: Every day | ORAL | Status: DC
  Administered 2024-08-02 – 2024-08-03 (×2): 325 mg via ORAL
  Filled 2024-08-01 (×2): qty 1

## 2024-08-01 MED ORDER — EPHEDRINE SULFATE-NACL 50-0.9 MG/10ML-% IV SOSY
PREFILLED_SYRINGE | INTRAVENOUS | Status: DC | PRN
  Administered 2024-08-01: 10 mg via INTRAVENOUS

## 2024-08-01 MED ORDER — NAPHAZOLINE-GLYCERIN 0.012-0.25 % OP SOLN: 1.0000 [drp] | Freq: Four times a day (QID) | OPHTHALMIC | Status: DC | PRN

## 2024-08-01 MED ORDER — GABAPENTIN 300 MG PO CAPS
300.0000 mg | ORAL_CAPSULE | ORAL | Status: AC
Start: 2024-08-01 — End: 2024-08-01
  Administered 2024-08-01: 300 mg via ORAL
  Filled 2024-08-01: qty 3

## 2024-08-01 MED ORDER — ENSURE PRE-SURGERY PO LIQD
592.0000 mL | Freq: Once | ORAL | Status: DC
  Filled 2024-08-01: qty 592

## 2024-08-01 MED ORDER — VASOPRESSIN 20 UNIT/ML IV SOLN
INTRAVENOUS | Status: AC
  Filled 2024-08-01: qty 1

## 2024-08-01 MED ORDER — ALBUMIN HUMAN 5 % IV SOLN: INTRAVENOUS | Status: DC | PRN

## 2024-08-01 MED ORDER — LACTATED RINGERS IV SOLN: INTRAVENOUS | Status: DC

## 2024-08-01 MED ORDER — SODIUM CHLORIDE 0.9% FLUSH: 3.0000 mL | INTRAVENOUS | Status: DC | PRN

## 2024-08-01 MED ORDER — ONDANSETRON HCL 4 MG PO TABS: 4.0000 mg | ORAL_TABLET | Freq: Three times a day (TID) | ORAL | 5 refills | Status: AC | PRN

## 2024-08-01 MED ORDER — SODIUM CHLORIDE 0.9% FLUSH
3.0000 mL | Freq: Two times a day (BID) | INTRAVENOUS | Status: DC
  Administered 2024-08-01 – 2024-08-02 (×3): 3 mL via INTRAVENOUS

## 2024-08-01 MED ORDER — IRON-VITAMIN C 65-125 MG PO TABS: 1.0000 | ORAL_TABLET | Freq: Every evening | ORAL | Status: DC

## 2024-08-01 MED ORDER — MENTHOL 3 MG MT LOZG: 1.0000 | LOZENGE | OROMUCOSAL | Status: DC | PRN

## 2024-08-01 MED ORDER — ENOXAPARIN SODIUM 40 MG/0.4ML IJ SOSY
40.0000 mg | PREFILLED_SYRINGE | INTRAMUSCULAR | Status: DC
  Administered 2024-08-02: 40 mg via SUBCUTANEOUS
  Filled 2024-08-01: qty 0.4

## 2024-08-01 MED ORDER — DEXAMETHASONE SOD PHOSPHATE PF 10 MG/ML IJ SOLN
4.0000 mg | INTRAMUSCULAR | Status: AC
  Administered 2024-08-01: 10 mg via INTRAVENOUS

## 2024-08-01 MED ORDER — DEXMEDETOMIDINE HCL IN NACL 80 MCG/20ML IV SOLN
INTRAVENOUS | Status: DC | PRN
  Administered 2024-08-01: 10 ug via INTRAVENOUS

## 2024-08-01 MED ORDER — DIPHENHYDRAMINE HCL 12.5 MG/5ML PO ELIX
12.5000 mg | ORAL_SOLUTION | Freq: Four times a day (QID) | ORAL | Status: DC | PRN
Start: 2024-08-01 — End: 2024-08-03

## 2024-08-01 MED ORDER — CHLORHEXIDINE GLUCONATE 0.12 % MT SOLN
15.0000 mL | Freq: Once | OROMUCOSAL | Status: AC
  Administered 2024-08-01: 15 mL via OROMUCOSAL

## 2024-08-01 MED ORDER — ONDANSETRON HCL 4 MG/2ML IJ SOLN: 4.0000 mg | Freq: Once | INTRAMUSCULAR | Status: DC | PRN

## 2024-08-01 MED ORDER — SUGAMMADEX SODIUM 200 MG/2ML IV SOLN
INTRAVENOUS | Status: DC | PRN
  Administered 2024-08-01: 200 mg via INTRAVENOUS

## 2024-08-01 MED ORDER — DIPHENHYDRAMINE HCL 50 MG/ML IJ SOLN: 12.5000 mg | Freq: Four times a day (QID) | INTRAMUSCULAR | Status: DC | PRN

## 2024-08-01 MED ORDER — HYDROMORPHONE HCL 1 MG/ML IJ SOLN: 0.5000 mg | INTRAMUSCULAR | Status: DC | PRN

## 2024-08-01 MED ORDER — PROPOFOL 10 MG/ML IV BOLUS
INTRAVENOUS | Status: AC
  Filled 2024-08-01: qty 20

## 2024-08-01 MED ORDER — TRAMADOL HCL 50 MG PO TABS: 50.0000 mg | ORAL_TABLET | Freq: Four times a day (QID) | ORAL | Status: DC | PRN

## 2024-08-01 MED ORDER — PROCHLORPERAZINE MALEATE 10 MG PO TABS: 10.0000 mg | ORAL_TABLET | Freq: Four times a day (QID) | ORAL | Status: DC | PRN

## 2024-08-01 MED ORDER — LIDOCAINE HCL (PF) 2 % IJ SOLN
INTRAMUSCULAR | Status: AC
  Filled 2024-08-01: qty 5

## 2024-08-01 MED ORDER — SODIUM CHLORIDE 0.9 % IV SOLN: 250.0000 mL | INTRAVENOUS | Status: DC | PRN

## 2024-08-01 MED ORDER — VITAMIN C 500 MG PO TABS
250.0000 mg | ORAL_TABLET | Freq: Every day | ORAL | Status: DC
  Administered 2024-08-02 – 2024-08-03 (×2): 250 mg via ORAL
  Filled 2024-08-01 (×2): qty 1

## 2024-08-01 MED ORDER — VERAPAMIL HCL ER 120 MG PO CP24: 120.0000 mg | ORAL_CAPSULE | Freq: Every day | ORAL | Status: DC

## 2024-08-01 MED ORDER — CHLORHEXIDINE GLUCONATE CLOTH 2 % EX PADS: 6.0000 | MEDICATED_PAD | Freq: Once | CUTANEOUS | Status: DC

## 2024-08-01 MED ORDER — VASOPRESSIN 20 UNIT/ML IV SOLN
INTRAVENOUS | Status: DC | PRN
  Administered 2024-08-01: 2 [IU] via INTRAVENOUS

## 2024-08-01 MED ORDER — ROCURONIUM BROMIDE 10 MG/ML (PF) SYRINGE
PREFILLED_SYRINGE | INTRAVENOUS | Status: AC
  Filled 2024-08-01: qty 10

## 2024-08-01 MED ORDER — ONDANSETRON HCL 4 MG/2ML IJ SOLN
INTRAMUSCULAR | Status: DC | PRN
  Administered 2024-08-01: 4 mg via INTRAVENOUS

## 2024-08-01 MED ORDER — PHENYLEPHRINE HCL-NACL 20-0.9 MG/250ML-% IV SOLN
INTRAVENOUS | Status: DC | PRN
  Administered 2024-08-01: 50 ug/min via INTRAVENOUS

## 2024-08-01 MED ORDER — DEXMEDETOMIDINE HCL IN NACL 80 MCG/20ML IV SOLN
INTRAVENOUS | Status: AC
  Filled 2024-08-01: qty 20

## 2024-08-01 MED ORDER — SODIUM CHLORIDE 0.9 % IV SOLN
2.0000 g | INTRAVENOUS | Status: AC
Start: 2024-08-01 — End: 2024-08-01
  Administered 2024-08-01: 2 g via INTRAVENOUS
  Filled 2024-08-01: qty 20

## 2024-08-01 MED ORDER — METOPROLOL TARTRATE 5 MG/5ML IV SOLN: 5.0000 mg | Freq: Four times a day (QID) | INTRAVENOUS | Status: DC | PRN

## 2024-08-01 MED ORDER — ORAL CARE MOUTH RINSE: 15.0000 mL | Freq: Once | OROMUCOSAL | Status: AC

## 2024-08-01 MED ORDER — BISACODYL 10 MG RE SUPP: 10.0000 mg | Freq: Every day | RECTAL | Status: DC | PRN

## 2024-08-01 MED ORDER — GABAPENTIN 100 MG PO CAPS
300.0000 mg | ORAL_CAPSULE | Freq: Two times a day (BID) | ORAL | Status: DC
  Administered 2024-08-01 – 2024-08-03 (×4): 300 mg via ORAL
  Filled 2024-08-01 (×4): qty 3

## 2024-08-01 MED ORDER — PHENYLEPHRINE 80 MCG/ML (10ML) SYRINGE FOR IV PUSH (FOR BLOOD PRESSURE SUPPORT)
PREFILLED_SYRINGE | INTRAVENOUS | Status: DC | PRN
  Administered 2024-08-01 (×2): 160 ug via INTRAVENOUS
  Administered 2024-08-01: 80 ug via INTRAVENOUS
  Administered 2024-08-01: 120 ug via INTRAVENOUS
  Administered 2024-08-01 (×2): 160 ug via INTRAVENOUS
  Administered 2024-08-01: 200 ug via INTRAVENOUS

## 2024-08-01 MED ORDER — BUPIVACAINE-EPINEPHRINE 0.25% -1:200000 IJ SOLN
INTRAMUSCULAR | Status: DC | PRN
  Administered 2024-08-01: 60 mL

## 2024-08-01 MED ORDER — 0.9 % SODIUM CHLORIDE (POUR BTL) OPTIME
TOPICAL | Status: DC | PRN
  Administered 2024-08-01: 1000 mL

## 2024-08-01 MED ORDER — METHOCARBAMOL 500 MG PO TABS: 500.0000 mg | ORAL_TABLET | Freq: Four times a day (QID) | ORAL | Status: DC | PRN

## 2024-08-01 MED ORDER — PROCHLORPERAZINE EDISYLATE 10 MG/2ML IJ SOLN: 5.0000 mg | Freq: Four times a day (QID) | INTRAMUSCULAR | Status: DC | PRN

## 2024-08-01 MED ORDER — FENTANYL CITRATE (PF) 250 MCG/5ML IJ SOLN
INTRAMUSCULAR | Status: DC | PRN
  Administered 2024-08-01 (×4): 50 ug via INTRAVENOUS

## 2024-08-01 MED ORDER — TRAMADOL HCL 50 MG PO TABS: 50.0000 mg | ORAL_TABLET | Freq: Four times a day (QID) | ORAL | 0 refills | Status: AC | PRN

## 2024-08-01 MED ORDER — ONDANSETRON HCL 4 MG/2ML IJ SOLN: 4.0000 mg | Freq: Four times a day (QID) | INTRAMUSCULAR | Status: DC | PRN

## 2024-08-01 MED ORDER — ACETAMINOPHEN 500 MG PO TABS
1000.0000 mg | ORAL_TABLET | ORAL | Status: AC
Start: 2024-08-01 — End: 2024-08-01
  Administered 2024-08-01: 1000 mg via ORAL
  Filled 2024-08-01: qty 2

## 2024-08-01 MED ORDER — BUPIVACAINE-EPINEPHRINE (PF) 0.25% -1:200000 IJ SOLN
INTRAMUSCULAR | Status: AC
  Filled 2024-08-01: qty 60

## 2024-08-01 MED ORDER — SIMETHICONE 80 MG PO CHEW
80.0000 mg | CHEWABLE_TABLET | Freq: Four times a day (QID) | ORAL | Status: DC
  Administered 2024-08-01 – 2024-08-03 (×6): 80 mg via ORAL
  Filled 2024-08-01 (×6): qty 1

## 2024-08-01 MED ORDER — LACTATED RINGERS IR SOLN
Status: DC | PRN
  Administered 2024-08-01: 1000 mL

## 2024-08-01 MED ORDER — PHENOL 1.4 % MT LIQD: 2.0000 | OROMUCOSAL | Status: DC | PRN

## 2024-08-01 MED ORDER — AMISULPRIDE (ANTIEMETIC) 5 MG/2ML IV SOLN: 10.0000 mg | Freq: Once | INTRAVENOUS | Status: DC | PRN

## 2024-08-01 MED ORDER — PROPOFOL 10 MG/ML IV BOLUS
INTRAVENOUS | Status: DC | PRN
  Administered 2024-08-01: 160 mg via INTRAVENOUS

## 2024-08-01 MED ORDER — BUPIVACAINE LIPOSOME 1.3 % IJ SUSP: 20.0000 mL | Freq: Once | INTRAMUSCULAR | Status: DC

## 2024-08-01 MED ORDER — VERAPAMIL HCL ER 120 MG PO TBCR
120.0000 mg | EXTENDED_RELEASE_TABLET | Freq: Every day | ORAL | Status: DC
  Administered 2024-08-01 – 2024-08-02 (×2): 120 mg via ORAL
  Filled 2024-08-01 (×2): qty 1

## 2024-08-01 MED ORDER — ROCURONIUM BROMIDE 10 MG/ML (PF) SYRINGE
PREFILLED_SYRINGE | INTRAVENOUS | Status: DC | PRN
  Administered 2024-08-01: 90 mg via INTRAVENOUS
  Administered 2024-08-01: 10 mg via INTRAVENOUS

## 2024-08-01 MED ORDER — HYDROMORPHONE HCL 2 MG/ML IJ SOLN
INTRAMUSCULAR | Status: AC
  Filled 2024-08-01: qty 1

## 2024-08-01 MED ORDER — SUGAMMADEX SODIUM 200 MG/2ML IV SOLN
INTRAVENOUS | Status: AC
  Filled 2024-08-01: qty 2

## 2024-08-01 MED ORDER — ONDANSETRON HCL 4 MG/2ML IJ SOLN
INTRAMUSCULAR | Status: AC
  Filled 2024-08-01: qty 2

## 2024-08-01 MED ORDER — FENTANYL CITRATE (PF) 250 MCG/5ML IJ SOLN
INTRAMUSCULAR | Status: AC
  Filled 2024-08-01: qty 5

## 2024-08-01 MED ORDER — METHOCARBAMOL 1000 MG/10ML IJ SOLN: 1000.0000 mg | Freq: Four times a day (QID) | INTRAMUSCULAR | Status: DC | PRN

## 2024-08-01 MED ORDER — HYDROMORPHONE HCL 1 MG/ML IJ SOLN
INTRAMUSCULAR | Status: DC | PRN
  Administered 2024-08-01 (×2): 1 mg via INTRAVENOUS

## 2024-08-01 MED ORDER — MAGIC MOUTHWASH
15.0000 mL | Freq: Four times a day (QID) | ORAL | Status: DC | PRN
Start: 2024-08-01 — End: 2024-08-03

## 2024-08-01 MED ORDER — LACTATED RINGERS IV BOLUS: 1000.0000 mL | Freq: Three times a day (TID) | INTRAVENOUS | Status: DC | PRN

## 2024-08-01 MED ORDER — SUCCINYLCHOLINE CHLORIDE 200 MG/10ML IV SOSY
PREFILLED_SYRINGE | INTRAVENOUS | Status: DC | PRN
  Administered 2024-08-01: 120 mg via INTRAVENOUS

## 2024-08-01 MED ORDER — LIDOCAINE 2% (20 MG/ML) 5 ML SYRINGE
INTRAMUSCULAR | Status: DC | PRN
  Administered 2024-08-01: 60 mg via INTRAVENOUS

## 2024-08-01 MED ORDER — MAGNESIUM HYDROXIDE 400 MG/5ML PO SUSP
30.0000 mL | Freq: Every day | ORAL | Status: DC | PRN
Start: 2024-08-01 — End: 2024-08-03

## 2024-08-01 MED ORDER — ESMOLOL HCL 100 MG/10ML IV SOLN
INTRAVENOUS | Status: AC
Start: 2024-08-01 — End: 2024-08-01
  Filled 2024-08-01: qty 10

## 2024-08-01 MED ORDER — LACTATED RINGERS IV SOLN: Freq: Three times a day (TID) | INTRAVENOUS | Status: DC | PRN

## 2024-08-01 SURGICAL SUPPLY — 67 items
APPLICATOR COTTON TIP 6 STRL (MISCELLANEOUS) ×2 IMPLANT
BAG COUNTER SPONGE SURGICOUNT (BAG) ×2 IMPLANT
BLADE SURG SZ11 CARB STEEL (BLADE) ×2 IMPLANT
CHLORAPREP W/TINT 26 (MISCELLANEOUS) ×2 IMPLANT
CLIP APPLIE 5 13 M/L LIGAMAX5 (MISCELLANEOUS) IMPLANT
COVER SURGICAL LIGHT HANDLE (MISCELLANEOUS) ×2 IMPLANT
COVER TIP SHEARS 8 DVNC (MISCELLANEOUS) IMPLANT
DEFOGGER SCOPE WARM SEASHARP (MISCELLANEOUS) ×2 IMPLANT
DRAIN CHANNEL 19F RND (DRAIN) IMPLANT
DRAIN PENROSE 0.5X18 (DRAIN) IMPLANT
DRAPE ARM DVNC X/XI (DISPOSABLE) ×8 IMPLANT
DRAPE COLUMN DVNC XI (DISPOSABLE) ×2 IMPLANT
DRAPE WARM FLUID 44X44 (DRAPES) ×2 IMPLANT
DRIVER NDL LRG 8 DVNC XI (INSTRUMENTS) ×4 IMPLANT
DRIVER NDL MEGA SUTCUT DVNCXI (INSTRUMENTS) ×2 IMPLANT
DRIVER NDLE LRG 8 DVNC XI (INSTRUMENTS) ×2 IMPLANT
DRIVER NDLE MEGA SUTCUT DVNCXI (INSTRUMENTS) ×1 IMPLANT
DRSG TEGADERM 2-3/8X2-3/4 SM (GAUZE/BANDAGES/DRESSINGS) ×10 IMPLANT
ELECT REM PT RETURN 15FT ADLT (MISCELLANEOUS) ×2 IMPLANT
EVACUATOR DRAINAGE 10X20 100CC (DRAIN) IMPLANT
EVACUATOR SILICONE 100CC (DRAIN) IMPLANT
FELT TEFLON 4 X1 (Mesh General) ×2 IMPLANT
FORCEPS PROGRASP DVNC XI (FORCEP) ×2 IMPLANT
GAUZE SPONGE 2X2 8PLY STRL LF (GAUZE/BANDAGES/DRESSINGS) ×2 IMPLANT
GLOVE ECLIPSE 8.0 STRL XLNG CF (GLOVE) ×4 IMPLANT
GLOVE INDICATOR 8.0 STRL GRN (GLOVE) ×4 IMPLANT
GOWN STRL REUS W/ TWL XL LVL3 (GOWN DISPOSABLE) ×4 IMPLANT
GRASPER SUT TROCAR 14GX15 (MISCELLANEOUS) IMPLANT
GRASPER TIP-UP FEN DVNC XI (INSTRUMENTS) ×2 IMPLANT
IRRIGATION SUCT STRKRFLW 2 WTP (MISCELLANEOUS) ×2 IMPLANT
KIT BASIN OR (CUSTOM PROCEDURE TRAY) ×2 IMPLANT
KIT TURNOVER KIT A (KITS) ×2 IMPLANT
MARKER SKIN DUAL TIP RULER LAB (MISCELLANEOUS) ×2 IMPLANT
MESH BIO-A 7X10 SYN MAT (Mesh General) IMPLANT
NDL HYPO 22X1.5 SAFETY MO (MISCELLANEOUS) ×2 IMPLANT
NDL INSUFFLATION 14GA 120MM (NEEDLE) ×2 IMPLANT
NEEDLE HYPO 22X1.5 SAFETY MO (MISCELLANEOUS) ×1 IMPLANT
NEEDLE INSUFFLATION 14GA 120MM (NEEDLE) ×1 IMPLANT
PACK CARDIOVASCULAR III (CUSTOM PROCEDURE TRAY) ×2 IMPLANT
PAD POSITIONING PINK XL (MISCELLANEOUS) ×2 IMPLANT
PENCIL SMOKE EVACUATOR (MISCELLANEOUS) IMPLANT
SCISSORS LAP 5X45 EPIX DISP (ENDOMECHANICALS) IMPLANT
SCISSORS MNPLR CVD DVNC XI (INSTRUMENTS) ×2 IMPLANT
SEAL UNIV 5-12 XI (MISCELLANEOUS) ×6 IMPLANT
SEALER VESSEL EXT DVNC XI (MISCELLANEOUS) ×2 IMPLANT
SOLUTION ELECTROSURG ANTI STCK (MISCELLANEOUS) ×2 IMPLANT
SPIKE FLUID TRANSFER (MISCELLANEOUS) ×2 IMPLANT
STOPCOCK 4 WAY LG BORE MALE ST (IV SETS) ×4 IMPLANT
SUT ETHIBOND 0 36 GRN (SUTURE) ×4 IMPLANT
SUT ETHIBOND NAB CT1 #1 30IN (SUTURE) ×6 IMPLANT
SUT MNCRL AB 4-0 PS2 18 (SUTURE) ×2 IMPLANT
SUT PDS AB 1 CT1 27 (SUTURE) IMPLANT
SUT PROLENE 2 0 SH DA (SUTURE) IMPLANT
SUT STRATA PDS 2-0 23 CT-1 (SUTURE) IMPLANT
SUT VIC AB 3-0 SH 27XBRD (SUTURE) IMPLANT
SUT VICRYL 0 UR6 27IN ABS (SUTURE) IMPLANT
SUT VLOC 180 2-0 6IN GS21 (SUTURE) IMPLANT
SUT VLOC 180 2-0 9IN GS21 (SUTURE) IMPLANT
SUT VLOC 180 3-0 9IN GS21 (SUTURE) IMPLANT
SUTURE VLOC BRB 180 ABS3/0GR12 (SUTURE) IMPLANT
SYR 10ML LL (SYRINGE) ×2 IMPLANT
SYR 20ML LL LF (SYRINGE) ×2 IMPLANT
TOWEL OR 17X26 10 PK STRL BLUE (TOWEL DISPOSABLE) ×2 IMPLANT
TRAY FOLEY MTR SLVR 14FR STAT (SET/KITS/TRAYS/PACK) IMPLANT
TRAY FOLEY MTR SLVR 16FR STAT (SET/KITS/TRAYS/PACK) IMPLANT
TROCAR ADV FIXATION 5X100MM (TROCAR) ×2 IMPLANT
TUBING INSUFFLATION 10FT LAP (TUBING) ×2 IMPLANT

## 2024-08-01 NOTE — Discharge Instructions (Signed)
 EATING AFTER YOUR ESOPHAGEAL SURGERY (Stomach Fundoplication, Hiatal Hernia repair, Achalasia surgery, etc)  ######################################################################  EAT Start with a pureed / full liquid diet (see below) Gradually transition to a high fiber diet with a fiber supplement over the next month after discharge.    WALK Walk an hour a day.  Control your pain to do that.    CONTROL PAIN Control pain so that you can walk, sleep, tolerate sneezing/coughing, go up/down stairs.  HAVE A BOWEL MOVEMENT DAILY Keep your bowels regular to avoid problems.  OK to try a laxative to override constipation.  OK to use an antidairrheal to slow down diarrhea.  Call if not better after 2 tries  CALL IF YOU HAVE PROBLEMS/CONCERNS Call if you are still struggling despite following these instructions. Call if you have concerns not answered by these instructions  ######################################################################   After your esophageal surgery, expect some sticking with swallowing over the next 1-2 months.    If food sticks when you eat, it is called dysphagia.  This is due to swelling around your esophagus at the wrap & hiatal diaphragm repair.  It will gradually ease off over the next few months.  To help you through this temporary phase, we start you out on a pureed (blenderized) diet.  Your first meal in the hospital was thin liquids.  You should have been given a pureed diet by the time you left the hospital.  We ask patients to stay on a pureed diet for the first 2-3 weeks to avoid anything getting stuck near your recent surgery.  Don't be alarmed if your ability to swallow doesn't progress according to this plan.  Everyone is different and some diets can advance more or less quickly.    It is often helpful to crush your medications or split them as they can sometimes stick, especially the first week or so.   Some BASIC RULES to follow are: Maintain  an upright position whenever eating or drinking. Take small bites - just a teaspoon size bite at a time. Eat slowly.  It may also help to eat only one food at a time. Consider nibbling through smaller, more frequent meals & avoid the urge to eat BIG meals Do not push through feelings of fullness, nausea, or bloatedness Do not mix solid foods and liquids in the same mouthful Try not to wash foods down with large gulps of liquids. Avoid carbonated (bubbly/fizzy) drinks.   Avoid foods that make you feel gassy or bloated.  Start with bland foods first.  Wait on trying greasy, fried, or spicy meals until you are tolerating more bland solids well. Understand that it will be hard to burp and belch at first.  This gradually improves with time.  Expect to be more gassy/flatulent/bloated initially.  Walking will help your body manage it better. Consider using medications for bloating that contain simethicone such as  Maalox or Gas-X  Consider crushing her medications, especially smaller pills.  The ability to swallow pills should get easier after a few weeks Eat in a relaxed atmosphere & minimize distractions. Avoid talking while eating.   Do not use straws. Following each meal, sit in an upright position (90 degree angle) for 60 to 90 minutes.  Going for a short walk can help as well If food does stick, don't panic.  Try to relax and let the food pass on its own.  Sipping WARM LIQUID such as strong hot black tea can also help slide it down.  Be gradual in changes & use common sense:  -If you easily tolerating a certain level of foods, advance to the next level gradually -If you are having trouble swallowing a particular food, then avoid it.   -If food is sticking when you advance your diet, go back to thinner previous diet (the lower LEVEL) for 1-2 days.  LEVEL 1 = PUREED DIET  Do for the first 2 WEEKS AFTER SURGERY  -Foods in this group are pureed or blenderized to a smooth, mashed  potato-like consistency.  -If necessary, the pureed foods can keep their shape with the addition of a thickening agent.   -Meat should be pureed to a smooth, pasty consistency.  Hot broth or gravy may be added to the pureed meat, approximately 1 oz. of liquid per 3 oz. serving of meat. -CAUTION:  If any foods do not puree into a smooth consistency, swallowing will be more difficult.  (For example, nuts or seeds sometimes do not blend well.)  Hot Foods Cold Foods  Pureed scrambled eggs and cheese Pureed cottage cheese  Baby cereals Thickened juices and nectars  Thinned cooked cereals (no lumps) Thickened milk or eggnog  Pureed French toast or pancakes Ensure  Mashed potatoes Ice cream  Pureed parsley, au gratin, scalloped potatoes, candied sweet potatoes Fruit or Italian ice, sherbet  Pureed buttered or alfredo noodles Plain yogurt  Pureed vegetables (no corn or peas) Instant breakfast  Pureed soups and creamed soups Smooth pudding, mousse, custard  Pureed scalloped apples Whipped gelatin  Gravies Sugar, syrup, honey, jelly  Sauces, cheese, tomato, barbecue, white, creamed Cream  Any baby food Creamer  Alcohol in moderation (not beer or champagne) Margarine  Coffee or tea Mayonnaise   Ketchup, mustard   Apple sauce   SAMPLE MENU:  PUREED DIET Breakfast Lunch Dinner  Orange juice, 1/2 cup Cream of wheat, 1/2 cup Pineapple juice, 1/2 cup Pureed turkey, barley soup, 3/4 cup Pureed Hawaiian chicken, 3 oz  Scrambled eggs, mashed or blended with cheese, 1/2 cup Tea or coffee, 1 cup  Whole milk, 1 cup  Non-dairy creamer, 2 Tbsp. Mashed potatoes, 1/2 cup Pureed cooled broccoli, 1/2 cup Apple sauce, 1/2 cup Coffee or tea Mashed potatoes, 1/2 cup Pureed spinach, 1/2 cup Frozen yogurt, 1/2 cup Tea or coffee      LEVEL 2 = SOFT DIET  After your first 2 weeks, you can advance to a soft diet.   Keep on this diet until everything goes down easily.  Hot Foods Cold Foods  White fish  Cottage cheese  Stuffed fish Junior baby fruit  Baby food meals Semi thickened juices  Minced soft cooked, scrambled, poached eggs nectars  Souffle & omelets Ripe mashed bananas  Cooked cereals Canned fruit, pineapple sauce, milk  potatoes Milkshake  Buttered or Alfredo noodles Custard  Cooked cooled vegetable Puddings, including tapioca  Sherbet Yogurt  Vegetable soup or alphabet soup Fruit ice, Italian ice  Gravies Whipped gelatin  Sugar, syrup, honey, jelly Junior baby desserts  Sauces:  Cheese, creamed, barbecue, tomato, white Cream  Coffee or tea Margarine   SAMPLE MENU:  LEVEL 2 Breakfast Lunch Dinner  Orange juice, 1/2 cup Oatmeal, 1/2 cup Scrambled eggs with cheese, 1/2 cup Decaffeinated tea, 1 cup Whole milk, 1 cup Non-dairy creamer, 2 Tbsp Pineapple juice, 1/2 cup Minced beef, 3 oz Gravy, 2 Tbsp Mashed potatoes, 1/2 cup Minced fresh broccoli, 1/2 cup Applesauce, 1/2 cup Coffee, 1 cup Turkey, barley soup, 3/4 cup Minced Hawaiian chicken, 3 oz  Mashed potatoes, 1/2 cup Cooked spinach, 1/2 cup Frozen yogurt, 1/2 cup Non-dairy creamer, 2 Tbsp      LEVEL 3 = CHOPPED DIET  -After all the foods in level 2 (soft diet) are passing through well you should advance up to more chopped foods.  -It is still important to cut these foods into small pieces and eat slowly.  Hot Foods Cold Foods  Poultry Cottage cheese  Chopped Swedish meatballs Yogurt  Meat salads (ground or flaked meat) Milk  Flaked fish (tuna) Milkshakes  Poached or scrambled eggs Soft, cold, dry cereal  Souffles and omelets Fruit juices or nectars  Cooked cereals Chopped canned fruit  Chopped French toast or pancakes Canned fruit cocktail  Noodles or pasta (no rice) Pudding, mousse, custard  Cooked vegetables (no frozen peas, corn, or mixed vegetables) Green salad  Canned small sweet peas Ice cream  Creamed soup or vegetable soup Fruit ice, Italian ice  Pureed vegetable soup or alphabet soup  Non-dairy creamer  Ground scalloped apples Margarine  Gravies Mayonnaise  Sauces:  Cheese, creamed, barbecue, tomato, white Ketchup  Coffee or tea Mustard   SAMPLE MENU:  LEVEL 3 Breakfast Lunch Dinner  Orange juice, 1/2 cup Oatmeal, 1/2 cup Scrambled eggs with cheese, 1/2 cup Decaffeinated tea, 1 cup Whole milk, 1 cup Non-dairy creamer, 2 Tbsp Ketchup, 1 Tbsp Margarine, 1 tsp Salt, 1/4 tsp Sugar, 2 tsp Pineapple juice, 1/2 cup Ground beef, 3 oz Gravy, 2 Tbsp Mashed potatoes, 1/2 cup Cooked spinach, 1/2 cup Applesauce, 1/2 cup Decaffeinated coffee Whole milk Non-dairy creamer, 2 Tbsp Margarine, 1 tsp Salt, 1/4 tsp Pureed turkey, barley soup, 3/4 cup Barbecue chicken, 3 oz Mashed potatoes, 1/2 cup Ground fresh broccoli, 1/2 cup Frozen yogurt, 1/2 cup Decaffeinated tea, 1 cup Non-dairy creamer, 2 Tbsp Margarine, 1 tsp Salt, 1/4 tsp Sugar, 1 tsp    LEVEL 4:  REGULAR FOODS  -Foods in this group are soft, moist, regularly textured foods.   -This level includes meat and breads, which tend to be the hardest things to swallow.   -Eat very slowly, chew well and continue to avoid carbonated drinks. -most people are at this level in 4-6 weeks  Hot Foods Cold Foods  Baked fish or skinned Soft cheeses - cottage cheese  Souffles and omelets Cream cheese  Eggs Yogurt  Stuffed shells Milk  Spaghetti with meat sauce Milkshakes  Cooked cereal Cold dry cereals (no nuts, dried fruit, coconut)  French toast or pancakes Crackers  Buttered toast Fruit juices or nectars  Noodles or pasta (no rice) Canned fruit  Potatoes (all types) Ripe bananas  Soft, cooked vegetables (no corn, lima, or baked beans) Peeled, ripe, fresh fruit  Creamed soups or vegetable soup Cakes (no nuts, dried fruit, coconut)  Canned chicken noodle soup Plain doughnuts  Gravies Ice cream  Bacon dressing Pudding, mousse, custard  Sauces:  Cheese, creamed, barbecue, tomato, white Fruit ice, Italian ice, sherbet   Decaffeinated tea or coffee Whipped gelatin  Pork chops Regular gelatin   Canned fruited gelatin molds   Sugar, syrup, honey, jam, jelly   Cream   Non-dairy   Margarine   Oil   Mayonnaise   Ketchup   Mustard   TROUBLESHOOTING IRREGULAR BOWELS  1) Avoid extremes of bowel movements (no bad constipation/diarrhea)  2) Miralax  17gm mixed in 8oz. water or juice-daily. May use BID as needed.  3) Gas-x,Phazyme, etc. as needed for gas & bloating.  4) Soft,bland diet. No spicy,greasy,fried foods.  5) Prilosec over-the-counter  as needed  6) May hold gluten/wheat products from diet to see if symptoms improve.  7) May try probiotics (Align, Activa, etc) to help calm the bowels down  7) If symptoms become worse call back immediately.    If you have any questions please call our office at [facility]: [phone].    ################################################################  LAPAROSCOPIC SURGERY: POST OP INSTRUCTIONS  ######################################################################  EAT Gradually transition to a high fiber diet with a fiber supplement over the next few weeks after discharge.  Start with a pureed / full liquid diet (see below)  WALK Walk an hour a day.  Control your pain to do that.    CONTROL PAIN Control pain so that you can walk, sleep, tolerate sneezing/coughing, go up/down stairs.  HAVE A BOWEL MOVEMENT DAILY Keep your bowels regular to avoid problems.  OK to try a laxative to override constipation.  OK to use an antidairrheal to slow down diarrhea.  Call if not better after 2 tries  CALL IF YOU HAVE PROBLEMS/CONCERNS Call if you are still struggling despite following these instructions. Call if you have concerns not answered by these instructions  ######################################################################    DIET: See esophageal surgery postop instructions.  Most people need to stay on a blenderized/pured diet for  the first couple of weeks and eventually get back to a more normal solid diet around 6 weeks.  Take your usually prescribed home medications unless otherwise directed. Blood thinners:  You can restart any strong blood thinners after the second postoperative day  for example: COUMADIN (warfarin), XERELTO (rivaroxaban ), ELIQUIS  (apixaban ), PLAVIX (clopidigrel), BRILINTA (ticagrelor), EFFIENT (prasugrel), PRADAXA (dabigatran), etc  Continue aspirin  before & after surgery..     Some oozing/bleeding the first 1-2 weeks is common but should taper down & be small volume.    If you are passing many large clots or having uncontrolling bleeding, call your surgeon  PAIN CONTROL: Pain is best controlled by a usual combination of three different methods TOGETHER: Ice/Heat Over the counter pain medication Prescription pain medication Most patients will experience some swelling and bruising around the incisions.  Ice packs or heating pads (30-60 minutes up to 6 times a day) will help. Use ice for the first few days to help decrease swelling and bruising, then switch to heat to help relax tight/sore spots and speed recovery.  Some people prefer to use ice alone, heat alone, alternating between ice & heat.  Experiment to what works for you.  Swelling and bruising can take several weeks to resolve.   It is helpful to take an over-the-counter pain medication regularly for the first few weeks.  Choose one of the following that works best for you: Naproxen (Aleve, etc)  Two 220mg  tabs twice a day Ibuprofen (Advil, etc) Three 200mg  tabs four times a day (every meal & bedtime) Acetaminophen  (Tylenol , etc) 500-650mg  four times a day (every meal & bedtime) A  prescription for pain medication (such as oxycodone , hydrocodone , tramadol , gabapentin, methocarbamol , etc) should be given to you upon discharge.  Take your pain medication as prescribed.  If you are having problems/concerns with the prescription medicine (does  not control pain, nausea, vomiting, rash, itching, etc), please call us  [phone] to see if we need to switch you to a different pain medicine that will work better for you and/or control your side effect better. If you need a refill on your pain medication, please give us  48 hour notice.  contact your pharmacy.  They will contact our office  to request authorization. Prescriptions will not be filled after 5 pm or on week-ends  AVOID GETTING CONSTIPATED.   a.  Between the surgery and the pain medications, it is common to experience some constipation.  b.  Drink plenty of liquids c   ake a fiber supplement 2 times day (such as Metamucil, Citrucel, FiberCon, MiraLax , etc) to have a bowel movement every day. d.  If you have not had a BM by 2 days after surgery: -drink liquids only until you have a bowel movement - take MiraLAX  2 doses every 2 hours until you have a bowel movement   Watch out for diarrhea.   If you have many loose bowel movements, simplify your diet to bland foods & liquids for a few days.   Stop any stool softeners and decrease your fiber supplement.   Switching to mild anti-diarrheal medications (Kayopectate, Pepto Bismol) can help.   If this worsens or does not improve, please call us .  Wash / shower every day.  You may shower over the dressings as they are waterproof.  Continue to shower over incision(s) after the dressing is off.  It is good for closed incisions and even open wounds to be washed every day.  Shower every day.  Short baths are fine.  Wash the incisions and wounds clean with soap & water.    You may leave closed incisions open to air if it is dry.   You may cover the incision with clean gauze & replace it after your daily shower for comfort.  TEGADERM:  You have clear gauze band-aid dressings over your closed incision(s).  Remove the dressings 2 days after surgery = 10/31.    ACTIVITIES as tolerated:   You may resume regular (light) daily activities  beginning the next day--such as daily self-care, walking, climbing stairs--gradually increasing activities as tolerated.  If you can walk 30 minutes without difficulty, it is safe to try more intense activity such as jogging, treadmill, bicycling, low-impact aerobics, swimming, etc. Save the most intensive and strenuous activity for last such as sit-ups, heavy lifting, contact sports, etc  Refrain from any heavy lifting or straining until you are off narcotics for pain control.   DO NOT PUSH THROUGH PAIN.  Let pain be your guide: If it hurts to do something, don't do it.  Pain is your body warning you to avoid that activity for another week until the pain goes down. You may drive when you are no longer taking prescription pain medication, you can comfortably wear a seatbelt, and you can safely maneuver your car and apply brakes. You may have sexual intercourse when it is comfortable.  FOLLOW UP in our office Please call [facility] at [phone] to set up an appointment to see your surgeon in the office for a follow-up appointment approximately 2-3 weeks after your surgery. Make sure that you call for this appointment the day you arrive home to insure a convenient appointment time.  10. IF YOU HAVE DISABILITY OR FAMILY LEAVE FORMS, BRING THEM TO THE OFFICE FOR PROCESSING.  DO NOT GIVE THEM TO YOUR DOCTOR.   WHEN TO CALL US  [phone]: Poor pain control Reactions / problems with new medications (rash/itching, nausea, etc)  Fever over 101.5 F (38.5 C) Inability to urinate Nausea and/or vomiting Worsening swelling or bruising Continued bleeding from incision. Increased pain, redness, or drainage from the incision   The clinic staff is available to answer your questions during regular business hours (8:30am-5pm).  Please don't  hesitate to call and ask to speak to one of our nurses for clinical concerns.   If you have a medical emergency, go to the nearest emergency room or call 911.  A surgeon  from [facility] is always on call at the [facility], [place] [address], [place], [place]  [address] ? MAIN: [phone] ? TOLL FREE: [phone] ?  FAX [phone] [email or web address]  ##############################################################

## 2024-08-01 NOTE — Op Note (Signed)
 08/01/2024  3:00 PM  PATIENT:  Cheryl Goodman  83 y.o. female  Patient Care Team: Cleotilde Oneil FALCON, MD as PCP - General (Internal Medicine) Clista Bimler, MD as Consulting Physician (Oncology) Florencio Cara BIRCH, MD as Consulting Physician (Cardiology) Sheldon Standing, MD as Consulting Physician (General Surgery) Burnette Fallow, MD as Consulting Physician (Gastroenterology)  PRE-OPERATIVE DIAGNOSIS:  PARAESOPHAGEAL HIATAL HERNIA REFRACTORY TO MEDICAL MANAGEMENT  POST-OPERATIVE DIAGNOSIS:  PARAESOPHAGEAL HIATAL HERNIA WITH CHRONIC VOLVULUS REFRACTORY TO MEDICAL MANAGEMENT  PROCEDURE:   1. ROBOTIC reduction of paraesophageal hiatal hernia 2. Type II mediastinal dissection. 3. Primary repair of hiatal hernia over pledgets.  4. Anterior & posterior gastropexy. 5. Toupet (270 degree partial posterior x 4cm)  fundoplication 6. Mesh reinforcement with absorbable mesh 7.  Splenic flexure mobilization  SURGEON:  Standing KYM Sheldon, MD  ASSISTANT:  (n/a)   ANESTHESIA:  General endotracheal intubation anesthesia (GETA) and Regional TRANSVERSUS ABDOMINIS PLANE (TAP) nerve block -BILATERAL for perioperative & postoperative pain control at the level of the transverse abdominis & preperitoneal spaces along the flank at the anterior axillary line, from subcostal ridge to iliac crest under laparoscopic guidance provided with 60 mL of bupivicaine 0.25% with epinephrine  Estimated Blood Loss (EBL):   Total I/O In: 2500 [I.V.:2000; IV Piggyback:500] Out: 125 [Urine:100; Blood:25].   (See anesthesia record)  Delay start of Pharmacological VTE agent (>24hrs) due to concerns of significant anemia, surgical blood loss, or risk of bleeding?:  no  DRAINS: (None) and 19 Fr Blake drain with tip resting in the mediastinum  SPECIMEN:  Hernia sac (not sent)  DISPOSITION OF SPECIMEN:  (not applicable)  COUNTS:  Sponge, needle, & instrument counts CORRECT at the conclusion of the case.      PLAN OF  CARE: Admit to inpatient   PATIENT DISPOSITION:  PACU - hemodynamically stable.  INDICATION:   Patient with symptomatic paraesophageal hiatal hernia.  The patient has had extensive work-up & we feel the patient will benefit from repair:  The anatomy & physiology of the foregut and anti-reflux mechanism was discussed.  The pathophysiology of hiatal herniation and GERD was discussed.  Natural history risks without surgery was discussed.   The patient's symptoms are not adequately controlled by medicines and other non-operative treatments.  I feel the risks of no intervention will lead to serious problems that outweigh the operative risks; therefore, I recommended surgery to reduce the hiatal hernia out of the chest and fundoplication to rebuild the anti-reflux valve and control reflux better.  Need for a thorough workup to rule out the differential diagnosis and plan treatment was explained.  I explained laparoscopic techniques with possible need for an open approach.  Risks such as bleeding, infection, abscess, leak, need for further treatment, heart attack, death, and other risks were discussed.   I noted a good likelihood this will help address the problem.  Goals of post-operative recovery were discussed as well.  Possibility that this will not correct all symptoms was explained.  Post-operative dysphagia, need for short-term liquid & pureed diet, inability to vomit, possibility of reherniation, possible need for medicines to help control symptoms in addition to surgery were discussed.  We will work to minimize complications.   Educational handouts further explaining the pathology, treatment options, and dysphagia diet was given as well.  Questions were answered.  The patient expresses understanding & wishes to proceed with surgery.  OR FINDINGS:   Large paraesophageal hiatal hernia with 90% of the stomach along with central omentum and mid  transverse colon in the mediastinum.  There was a 12 x 12 cm  hiatal defect.  Bilateral crural releases required.  It is a primary repair over pledgets.  Mesh reinforcement was used with GORE BIO-A mesh, a biosynthetic web scaffold made of 67% polyglycolic acid (PGA): 33% trimethylene carbonate (TMC).  The patient has a Toupet (270 degree partial posterior x 4cm)  fundoplication.  The patient has had anterior and posterior gastropexy.  DESCRIPTION:   Informed consent was confirmed.  The patient received IV antibiotics prior to incision.  The underwent general anesthesia without difficulty.  A Foley catheter sterilely placed.  The patient was positioned in split leg with arms tucked. The abdomen was prepped and draped in the sterile fashion.  Surgical time-out confirmed our plan.  I placed a 5 mm port in the left subcostal region using Varess entry technique with the patient in steep reverse Trendelenburg and left side up.  Entry was clean.  We induced carbon dioxide insufflation.  Camera inspection revealed no injury.  Under direct visualization, I placed extra ports.  I also placed a 5 mm port in the left subxiphoid region under direct visualization.  I removed that and placed an Omega-shaped rigid Nathanson liver retractor to lift the left lateral sector of the liver anteriorly to expose the esophageal hiatus.  This was secured to the bed using the iron  man system.  The Xi robot was carefully docked and instruments placed and advanced under direct visualization.  Apparent stomach and some omentum and colon up into the large hiatal hernia defect.  We focused on dissection.  We grasped the anterior mediastinal sac at the apex of the crus.  I scored through that and got into the anterior mediastinum.  I was able to free the mediastinal sac from its attachments to the pericardium and bilateral pleura using primarily focused gentle blunt dissection as well as focused vessel sealer dissection.  I transected phrenoesophageal attachments to the inner right crus,  preserving a two centimeter cuff of mediastinal sac until I found the base of the crura.  I then came around anteriorly on the left side and freed up the phrenoesophageal attachments of the mediastinal sac on the medial part of the left crus on the superior half.  I did careful mediastinal dissection to free the mediastinal sac.  With that, we could relieve the suction cup affect of the hernia sac and help reduce the stomach back down into the abdomen, flipped back approriately.  Patient had significant central abdominal fat with enlarged omentum and very high splenic flexure densely adherent to stomach and spleen.  We ligated the short gastrics along the lesser curvature of the stomach about a third the way down and then came up proximally over the fundus.  We released the attachments of the stomach to the retroperitoneum until we were able to connect with the prior dissection on the left crus.  For visualization I ended up excising the anterior and posterior hernia sacs and very large epiphrenic pads to get rid of a large volume of fat and chronically edematous/inflamed epiphrenic pads and omentum.  The splenic flexure of the colon and distal transverse colon Wanting to flip off.  I therefore got into the lesser sac between the greater curvature and the transverse colon at the midpoint.  I then freed off the distal transverse colon and splenic flexure attachments to the retroperitoneum, kidney, and especially splenocolic attachments.  With that I could get to the colon mobilized down infraumbilically out  of the way for better visualization.  Trimmed off some redundant omentum until he had much better visualization.  We completed the release of phrenoesophageal attachments to the medial part of the left crus down to its base.  With this, we had circumferential mobilization.    We placed the stomach and esophagus on axial tension.  I then did a Type II mediastinal dissection where I freed the esophagus from its  attachments to the aorta, spine, pleura, and pericardium using primarily gentle blunt as well as focused ultrasonic dissection.  We saw the anterior & posterior vagus nerves intact.  We preserved it at all times.  I procedded to dissect about 20 cm proximally into the mediastinum.  The right pleura was rather adherent to the mid esophagus.  Ended up having to release some attachments to it and open up the right pleura.  That provides some relaxation.  I later had opened up the left pleura as well in the vertical sagittal plane.  I made sure that they were wide pleural openings to avoid any tension set up.  With that I could straighten out the esophagus and get 5 cm of intra-abdominal length of the esophagus at a best estimation.  I meticulously dissected all the fat off of the cardia fundus, angle of Hiss, anterior and posterior esophagus.  With that, I could better define the esophagogastric junction.  I confirmed the the patient had 6 to 7 cm of intra-abdominal esophageal length with mild tension, 4-5cm off tension. The crura are rather thickened and tense and would not come together.  I therefore focused on doing a crural release on the right side.  I went through the diaphragm fascia 3 cm lateral to the inner right crus in the sagittal plane, staying more than a centimeter away from the IVC.  Carried that cephalad and got 2-3 cm release.  Still some tension on the left side especially.  I mobilized the spleen and kidney off the left diaphragm.  I did a left diaphragmatic release in the sagittal plane vertically through the intra-abdominal diaphragmatic fascia 5 cm lateral to the inner left crus.  That provided about 3 cm of release.  Without the crura, again there well without tension.  I brought the fundus of the stomach posterior to the esophagus over to the right side.  The wrap was mobile with the classic shoe shine maneuver.  Wrap became together gently.  We reflected the stomach left laterally and  closed the esophageal hiatus using #1 Ethibond stitch using horizontal mattress stitches with pledgets on both sides.  I did that x2 stitches.  I then did a third #1 Ethibond horizontal mattress suture to close the hiatal hernia at the most superior junction without pledgets.  The crura had good substance and they came together well without any tension.  Because of the larger defect requiring crural releases, I reinforced the repair using a Bio-A 10x7 cm biosynthetic precut mesh. We brought the mesh in and laid it over the crural repair, tails anterior over the crura.  I tucked the broader U tail of the mesh between the left diaphragm and the spleen, the narrower U tail over the right crus.  I secured to the left lateral and left superior sides of the broader U tail to the left diaphragm band with 2-0 V lock running suture.  Secured the narrow towel to the right crus using a running 2-0 V-lock suture as well  I brought the fundus of the stomach behind  the esophagus and cardia to set up a fundoplication wrap.  I did a posterior gastropexy x3  by taking 0 & #1 Ethibond interrupted stitches to the posterior part of the right side of the wrap and thru the mesh and crural closure.  I placed a stitch on the inner right crus, superior part of the wrap, superior lateral esophagus and tied that down for a right anterior gastropexy.  I did a similar stitch on the left anterior side as well.  That way the stomach covered the mesh and protected it from any esophageal exposure.  With the anterior and posterior gastropexies, stomach laid well for a fundoplication wrap.  Given her advanced age and inability to get manometry done, decided to hold off on a full fundoplication and did a posterior partial wrap.  I then did a classic 4cm Toupet fundoplication on the true esophagus above the cardia using 0 Ethibond stitch in the left superior side of the wrap, apex of the left inner crus then left anterior esophagus and tied  that down to do a left anterior gastropexy.  Did a mirror-image stitch on the right side to do a right anterior gastropexy.  I then did 2 more distal pairs of suture between the inner part of the wrap and the anterolateral esophagus.  That way there were 3 total pairs of fundoplication sutures offering a posterior 270 degree wrap.  Measured at 4 cm.  Classic Toupet fundoplication.    Did inspection.  Hemostasis was good on the diaphragm and left retroperitoneum.  Spleen clear.  No oozing or bleeding on the periaortic retroperitoneum near the posterior crura.  We undocked the robot.  I removed all needles and ties.  Removed the numerous fat pads and hernia sac gradually through the 12 mm port.  I placed a #1 PDS through the fascia of the 12 port site in the right epigastric region and clamped that.  The wrap was soft and floppy.  I placed a drain through the right epigastric 12 mm port as noted above.  Tied that fascial stitch down.  Position the drain up into the mediastinum to good result.  I did irrigation and ensured hemostasis.  I saw no evidence of any leak or perforation or other abnormality.  I removed the [facility] liver retractor under direct visualization.  I evacuated carbon dioxide and removed the ports.  The skin was closed with Monocryl and sterile dressings applied.  The patient is being extubated and brought back to the recovery room.  No major intra-abdominal events so hopefully can send up to the floor.  If misbehaves in the PACU, will place in stepdown.  We will se.   I discussed postop care in detail with the patient and family in in the office.  Discussed again with the patient in the holding area.  I called and I got voicemail.  I updated the patient's status, discussed probable steps to recovery, and gave postoperative recommendations to the patient's spouse, Evron Devincentis.  Recommendations were made.  Questions were answered.  We will try and update later.   Elspeth KYM Schultze, M.D.,  F.A.C.S. Gastrointestinal and Minimally Invasive Surgery [facility] [address] [place], [place] [address] [phone] Main / Paging

## 2024-08-01 NOTE — Anesthesia Procedure Notes (Signed)
 Procedure Name: Intubation Date/Time: 08/01/2024 11:29 AM  Performed by: Nanci Riis, CRNAPre-anesthesia Checklist: Patient identified, Emergency Drugs available, Suction available, Patient being monitored and Timeout performed Patient Re-evaluated:Patient Re-evaluated prior to induction Oxygen Delivery Method: Circle system utilized Preoxygenation: Pre-oxygenation with 100% oxygen Induction Type: IV induction, Rapid sequence and Cricoid Pressure applied Laryngoscope Size: Miller and 3 Grade View: Grade I Tube type: Oral Tube size: 7.0 mm Number of attempts: 1 Airway Equipment and Method: Stylet Placement Confirmation: ETT inserted through vocal cords under direct vision, positive ETCO2 and breath sounds checked- equal and bilateral Secured at: 21 cm Tube secured with: Tape Dental Injury: Teeth and Oropharynx as per pre-operative assessment

## 2024-08-01 NOTE — H&P (Signed)
 08/01/2024     PATIENT NAME: Cheryl Goodman MRN: [id number] DOB: 1941/05/05 PHYSICIANS:  REFERRING PHYSICIAN: Cleotilde Oneil Novel, *  CARE TEAM:  Patient Care Team: Cleotilde Oneil Novel, MD as PCP - General (Internal Medicine) Florencio Cara Endow, MD as Consulting Provider (Cardiovascular Disease) Sheldon, Elspeth Bitter, MD as Consulting Provider (General Surgery) Burnette Fallow, MD as Referring Physician (Internal Medicine)  CONSULTING PROVIDER: ELSPETH BITTER SHELDON, MD  SUBJECTIVE   Chief Complaint: New Consultation   Cheryl Goodman is a 83 y.o. female  who is seen today as an office consultation  at the request of DrRONITA Cleotilde  for evaluation of hiatal hernia.  History of Present Illness:  83 year old obese woman. Has had a known hiatal hernia for some time. Has struggled with intermittent nausea vomiting heartburn. Also has moderate chronic anemia. Was admitted for melena. History of stroke and pulmonary embolisms are chronically anticoagulated on Eliquis . Workup showed no definite GI tract bleeding by nuclear medicine scan or capsule endoscopy. However hiatal hernia has gotten larger. She had been followed by [facility] gastroenterology. Dr. Vreeland/Outlaw. Has had worsening episodes of postprandial pain and bloating. She will eat and vomit. Feels like she needs to belch but cannot. Frustrating. Last CT scan showed hiatal hernia twisted in a chronic mesentero-axial volvulus. Saw surgeon in [place], but primary care wanted second opinion with our group.  Patient came today with her husband. She feels frustrated. She has been on Protonix  for a while. More recently she has been trying Reglan  and Carafate . Still struggling. She has unintentionally lost about 10 pounds. She has a normal echocardiogram but there was discussion about getting a Lexiscan  Myoview. She says she has had that done but I cannot find the results. She is pretty sure and cardiology cleared her.  She does not have manometry. She denies any severe constipation. She can walk about 10 minutes. She does not need a walker or cane but does get dyspnea on exertion. She recalls having an appendectomy and cholecystectomy decades ago. Nothing recently.  Medical History:  Past Medical History:  Diagnosis Date  Acute pulmonary embolism with acute cor pulmonale (CMS/HHS-HCC) 08/10/2019  Severe RV dysfunction, left DVT, 11/20, Eliquis  started  Arthritis  Colon polyp  Diverticulosis  Gastro-esophageal reflux 05/30/2014  Hyperlipidemia  Hypertension  Kidney stones  OSA on CPAP 10/31/2018  AHI 40, RDI 19, 12/19  Osteoporosis   Patient Active Problem List  Diagnosis  OA (osteoarthritis) of knee  Osteoporosis  Essential hypertension with goal blood pressure less than 140/90  Erosive gastritis  Iron  deficiency anemia due to chronic blood loss  Hyperlipidemia, mixed  Medicare annual wellness visit, initial  Adult idiopathic generalized osteoporosis  OSA on CPAP  Laryngopharyngeal reflux (LPR)  Vitamin D  deficiency  B12 deficiency  History of pulmonary embolism  Acute CVA (cerebrovascular accident) (CMS/HHS-HCC)  Closed compression fracture of L3 lumbar vertebra with delayed healing, subsequent encounter  Hypercoagulable state (CMS/HHS-HCC)  Severe obesity (BMI 35.0-35.9 with comorbidity) (CMS/HHS-HCC)  DM type 2 with diabetic mixed hyperlipidemia (CMS/HHS-HCC)  Obesity, Class II, BMI 35-39.9  History of stroke  Chronic anticoagulation  Incarcerated hiatal hernia  Chronic Cameron ulcer  Mesenteroaxial gastric volvulus   Past Surgical History:  Procedure Laterality Date  APPENDECTOMY 1968  REDUCTION MAMMAPLASTY 2002  COLONOSCOPY 04/09/2004  Adenomatous Polyps  COLONOSCOPY 04/29/2006  Adenomatous Polyps  CHOLECYSTECTOMY 2009  COLONOSCOPY 04/20/2010  Adenomatous Polyps  Right carpal tunnel release 08/06/2013  COLONOSCOPY 01/28/2014  PH Adenomatous Polyps: CBF 01/2019: Recall ltr  mailed  EGD 01/28/2014  04/20/2010, 04/29/2006, 04/09/2004  EGD 09/01/2015  No repeat per RTE  REPLACEMENT TOTAL KNEE Right 10/31/2017  COLONOSCOPY 04/29/2021  Diverticulosis/Otherwise normal colon/No repeat due to age/JWB  EGD 04/29/2021  Large Hiatal hernia/No repeat/JWB  Hysterectomy 1982  KYPHOPLASTY  [id number]    Allergies  Allergen Reactions  Sulfa (Sulfonamide Antibiotics) Swelling, Anaphylaxis and Other (See Comments)  Dexlansoprazole Syncope and Other (See Comments)  Syncope  Oxycodone  Myristate Other (See Comments)  Penicillamine Other (See Comments)  Penicillin Hives  Penicillin G Rash  Ace Inhibitors Cough  Oxycodone  Nausea  Penicillins Rash  Has patient had a PCN reaction causing immediate rash, facial/tongue/throat swelling, SOB or lightheadedness with hypotension: No Has patient had a PCN reaction causing severe rash involving mucus membranes or skin necrosis: No Has patient had a PCN reaction that required hospitalization: No Has patient had a PCN reaction occurring within the last 10 years: No If all of the above answers are NO, then may proceed with Cephalosporin use.   Current Outpatient Medications on File Prior to Visit  Medication Sig Dispense Refill  acetaminophen  (TYLENOL ) 325 MG tablet Take 325-650 mg by mouth every 6 (six) hours  cholecalciferol , vitamin D3, (VITAMIN D3) 125 mcg (5,000 unit) tablet Take 1 tablet by mouth once daily.  CYANOCOBALAMIN , VITAMIN B-12, ORAL Take by mouth Dissolve one daily  diclofenac (VOLTAREN) 1 % topical gel Apply 2 g topically 4 (four) times daily 100 g 11  ELIQUIS  2.5 mg tablet TAKE 1 TABLET BY MOUTH EVERY 12 HOURS. 60 tablet 5  iron ,carbonyl/ascorbic acid (VITRON-C ORAL) Take by mouth One daily  metoclopramide  (REGLAN ) 5 MG tablet TAKE 1 TABLET (5 MG TOTAL) BY MOUTH AT BEDTIME FOR 30 DAYS 30 tablet 6  metoprolol  TARTrate (LOPRESSOR ) 25 MG tablet Take 2 doses (50 mg total) by mouth at bedtime the night before your  exam and take 2 doses (50 mg total) by mouth the morning of your heart CT exam. 4 tablet 0  pantoprazole  (PROTONIX ) 40 MG DR tablet Take 1 tablet (40 mg total) by mouth once daily 90 tablet 3  sucralfate  (CARAFATE ) 1 gram tablet TAKE 1 TABLET (1 G TOTAL) BY MOUTH 2 (TWO) TIMES DAILY BEFORE MEALS 60 tablet 11  TORsemide  (DEMADEX ) 5 MG tablet Take 1 tablet (5 mg total) by mouth once daily 30 tablet 11  traMADoL  (ULTRAM ) 50 mg tablet TAKE 1 TABLET BY MOUTH TWICE A DAY 60 tablet 5  triamterene -hydroCHLOROthiazide  (DYAZIDE ) 37.5-25 mg capsule Take 2 capsules by mouth once daily take 1 capsule by mouth every day in the morning (Patient taking differently: Take 2 capsules by mouth 2 (two) times daily) 180 capsule 3  verapamiL (VERELAN) 120 MG SR capsule Take 1 capsule (120 mg total) by mouth at bedtime 30 capsule 11   No current facility-administered medications on file prior to visit.   Family History  Problem Relation Age of Onset  Stroke Mother  Osteoporosis (Thinning of bones) Mother  Pneumonia Mother  Stroke Father  Prostate cancer Father  Alzheimer's disease Father  No Known Problems Son  No Known Problems Son  No Known Problems Son  No Known Problems Daughter  No Known Problems Daughter    Social History   Tobacco Use  Smoking Status Never  Smokeless Tobacco Never    Social History   Socioeconomic History  Marital status: Married  Tobacco Use  Smoking status: Never  Smokeless tobacco: Never  Vaping Use  Vaping status: Never Used  Substance and Sexual Activity  Alcohol  use: No  Alcohol/week: 0.0 standard drinks of alcohol  Drug use: No  Social History Narrative  Feels safe in home. Married .   Social Drivers of Corporate Investment Banker Strain: Low Risk (04/30/2024)  Overall Financial Resource Strain (CARDIA)  Difficulty of Paying Living Expenses: Not hard at all  Food Insecurity: No Food Insecurity (04/30/2024)  Hunger Vital Sign  Worried About Running Out of  Food in the Last Year: Never true  Ran Out of Food in the Last Year: Never true  Transportation Needs: No Transportation Needs (04/30/2024)  PRAPARE - Risk Analyst (Medical): No  Lack of Transportation (Non-Medical): No  Social Connections: Moderately Integrated (12/07/2023)  [facility] from [facility]  Social Connection and Isolation Panel  In a typical week, how many times do you talk on the phone with family, friends, or neighbors?: More than three times a week  How often do you get together with friends or relatives?: Once a week  How often do you attend church or religious services?: More than 4 times per year  Do you belong to any clubs or organizations such as church groups, unions, fraternal or athletic groups, or school groups?: No  How often do you attend meetings of the clubs or organizations you belong to?: Never  Are you married, widowed, divorced, separated, never married, or living with a partner?: Married  Housing Stability: Low Risk (04/30/2024)  Housing Stability Vital Sign  Unable to Pay for Housing in the Last Year: No  Number of Times Moved in the Last Year: 0  Homeless in the Last Year: No   ############################################################  Review of Systems: A complete review of systems (ROS) was obtained from the patient.  We have reviewed this information and discussed as appropriate with the patient.  See HPI as well for other pertinent ROS.  Constitutional: No fevers, chills, sweats. Weight stable Eyes: No vision changes, No discharge HENT: No sore throats, nasal drainage Lymph: No neck swelling, No bruising easily Pulmonary: No cough, productive sputum CV: No orthopnea, PND . No exertional chest/neck/shoulder/arm pain. Patient can walk 10 minutes gradually.   GI: No personal nor family history of GI/colon cancer, inflammatory bowel disease, irritable bowel syndrome, allergy such as Celiac Sprue, dietary/dairy problems,  colitis, ulcers nor gastritis. No recent sick contacts/gastroenteritis. No travel outside the country. No changes in diet.  Renal: No UTIs, No hematuria Genital: No drainage, bleeding, masses Musculoskeletal: No severe joint pain. Good ROM major joints Skin: No sores or lesions Heme/Lymph: No easy bleeding. No swollen lymph nodes Neuro: No active seizures. No facial droop Psych: No hallucinations. No agitation  OBJECTIVE   Vitals:  06/25/24 1105  BP: 107/71  Pulse: 100  Temp: 36.4 C (97.6 F)  SpO2: 98%  Weight: 77 kg (169 lb 12.8 oz)  Height: 147.3 cm (4' 10)  PainSc: 0-No pain   Body mass index is 35.49 kg/m.  PHYSICAL EXAM:  Constitutional: Not cachectic. Hygeine adequate. Vitals signs as above.  Eyes: No glasses. Vision adequate,Pupils reactive, normal extraocular movements. Sclera nonicteric Neuro: CN II-XII intact. No major focal sensory defects. No major motor deficits. Lymph: No head/neck/groin lymphadenopathy Psych: No severe agitation. No severe anxiety. Judgment & insight Adequate, Oriented x4, HENT: Normocephalic, Mucus membranes moist. No thrush. Hearing: adequate Neck: Supple, No tracheal deviation. No obvious thyromegaly Chest: No pain to chest wall compression. Good respiratory excursion. No audible wheezing CV: Pulses intact. regular. No major extremity edema Ext: No obvious deformity or contracture. Edema:  Not present. No cyanosis Skin: No major subcutaneous nodules. Warm and dry Musculoskeletal: Severe joint rigidity not present. No obvious clubbing. No digital petechiae. Mobility: no assist device moves with minimal assistance  Abdomen: Obese Soft. Nondistended. Nontender. Hernia: Not present. Diastasis recti: Not present. No hepatomegaly. No splenomegaly.  Genital/Pelvic: Inguinal hernia: Not present. Inguinal lymph nodes: without lymphadenopathy nor hidradenitis.   Rectal: (Deferred)  PE Chaperone note: Russell Christine, CMA, was included in the  room as chaperone for sensitive portions of the exam   ###################################################################  Labs, Imaging and Diagnostic Testing:  Located in 'Care Everywhere' section of Epic EMR chart  PRIOR [facility] CLINIC NOTES:  Not applicable  SURGERY NOTES:  Not applicable  PATHOLOGY:  Located in 'Care Everywhere' section of Epic EMR chart  Assessment and Plan:  DIAGNOSES:  Diagnoses and all orders for this visit:  Mesenteroaxial gastric volvulus  Incarcerated hiatal hernia  Chronic Ole ulcer  Iron  deficiency anemia due to chronic blood loss  Chronic anticoagulation  History of stroke  History of pulmonary embolism  Obesity, Class II, BMI 35-39.9    ASSESSMENT/PLAN  Pleasant obese elderly woman with chronic hiatal hernia that has gotten larger and now has chronic volvulus and obstructive symptoms with nausea vomiting, unintentional weight loss, iron  deficiency anemia with negative GI workup suspicious for chronic Cameron ulcerations and bleeding on chronic anticoagulation. Significant symptoms on Carafate  and PPI and Reglan .  I think she is getting worse with his hiatal hernia and would benefit from surgery. Robotic minimization process for reduce. Primary hiatal hernia repair with possible crural releases and Bio-A/Phasix absorbable mesh reconstruction. Probable partial fundoplication. I did caution with her advanced age, she is at risk for having cardiopulmonary mental or renal issues. However trying to be upfront about this before she gets more sick or completely obstructed should hopefully minimize those risks. She and her husband are aware and wish to be aggressive since she is getting worse and struggling.  The anatomy & physiology of the foregut and anti-reflux mechanism was discussed. The pathophysiology of hiatal herniation and GERD was discussed. Natural history risks without surgery was discussed. The patient's symptoms are not  adequately controlled by medicines and other non-operative treatments. I feel the risks of no intervention will lead to serious problems that outweigh the operative risks; therefore, I recommended surgery to reduce the hiatal hernia out of the chest and fundoplication to rebuild the anti-reflux valve and control reflux better. Need for a thorough workup to rule out the differential diagnosis and plan treatment was explained. I explained minimally invasive techniques with possible need for an open approach.  Risks such as bleeding, infection, abscess, leak,injury to other organs, need for repair of tissues / organs, need for further treatment, stroke, heart attack, death, and other risks were discussed. I noted a good likelihood this will help address the problem. Goals of post-operative recovery were discussed as well. Possibility that this will not correct all symptoms was explained. Post-operative dysphagia, need for short-term liquid & pureed diet, inability to vomit, possibility of reherniation, possible need for medicines to help control symptoms in addition to surgery were discussed. We will work to minimize complications. Educational handouts further explaining the pathology, treatment options, and dysphagia diet was given as well. Questions were answered. The patient expresses understanding & wishes to proceed with surgery.  Ideally would get manometry preoperatively, but that has not been able to arrange for another 6 months.  Patient does not want to wait that long.  Given her advanced  age, we will proceed with a Toupet partial fundoplication given her advanced age.   Her recurrence risk is higher given her obesity but I do not think we can wait for her to lose weight and she is starting to unintentionally lose weight. Tried 1 GLP inhibitor without much success. She thinks she is trying a different GLP-1 inhibitor, but I think she is talking about Carafate . Have to make sure that she is not on that  for a week preop.  Elspeth KYM Schultze, MD, FACS, MASCRS Esophageal, Gastrointestinal & Colorectal Surgery Robotic and Minimally Invasive Surgery  [facility] [address], [address] [place], [place] [address] [phone] Fax [phone] Main  CONTACT INFORMATION: Weekday (9AM-5PM): Call [facility] main office at [phone] Weeknight (5PM-9AM) or Weekend/Holiday: Check EPIC Web Links tab & use AMION (password  [email or web address]) for General Surgery [facility] coverage  Please, DO NOT use SecureChat  (it is not reliable communication to reach operating surgeons & will lead to a delay in care).   Epic staff messaging available for outpatient concerns needing 1-2 business day response.      08/01/2024

## 2024-08-01 NOTE — Interval H&P Note (Signed)
 History and Physical Interval Note:  08/01/2024 9:57 AM  Cheryl Goodman  has presented today for surgery, with the diagnosis of PARAESOPHAGEAL HIATAL HERNIA REFRACTORY TO MEDICAL MANAGEMENT.  The various methods of treatment have been discussed with the patient and family. After consideration of risks, benefits and other options for treatment, the patient has consented to  Procedure(s): REPAIR, HERNIA, PARAESOPHAGEAL, ROBOT-ASSISTED (N/A) as a surgical intervention.  The patient's history has been reviewed, patient examined, no change in status, stable for surgery.  I have reviewed the patient's chart and labs.  Questions were answered to the patient's satisfaction.    I have re-reviewed the the patient's records, history, medications, and allergies.  I have re-examined the patient.  I again discussed intraoperative plans and goals of post-operative recovery.  The patient agrees to proceed.  Cheryl Goodman  11-Feb-1941 [id number]  Patient Care Team: Cleotilde Oneil FALCON, MD as PCP - General (Internal Medicine) Clista Bimler, MD as Consulting Physician (Oncology) Florencio Cara BIRCH, MD as Consulting Physician (Cardiology) Sheldon Standing, MD as Consulting Physician (General Surgery) Burnette Fallow, MD as Consulting Physician (Gastroenterology)  Patient Active Problem List   Diagnosis Date Noted   Melena 12/06/2023   Diverticulitis 05/21/2023   Chronic kidney disease, stage 3a (HCC) 05/21/2023   History of pulmonary embolism 05/21/2023   Acute upper GI bleeding 12/27/2022   Acute upper GI bleed 09/18/2022   History of ischemic stroke 09/18/2022   Essential hypertension 09/18/2022   Hyperlipidemia 09/18/2022   AKI (acute kidney injury) 09/18/2022   Vitamin D  deficiency 10/15/2019   UTI due to Klebsiella species 08/03/2019   Gastroesophageal reflux disease    Hiatal hernia    Laryngopharyngeal reflux (LPR) 08/22/2018   Obstructive sleep apnea (adult) (pediatric) 08/22/2018   OA  (osteoarthritis) of knee 10/31/2017   Osteoarthritis of right knee 10/18/2017   Iron  deficiency anemia due to chronic blood loss 06/03/2016   Osteoporosis 05/30/2014    Past Medical History:  Diagnosis Date   Acute pulmonary embolism with acute cor pulmonale (HCC) 08/10/2019   Arthritis    Dysphagia    Edema    in legs   GERD (gastroesophageal reflux disease)    History of hiatal hernia    History of kidney stones    x2 ; passed independently   Hyperlipidemia    Hypertension    Nausea    OA (osteoarthritis) of knee 10/31/2017   Pneumonia    Stroke [facility])    mini stroke    Past Surgical History:  Procedure Laterality Date   ABDOMINAL HYSTERECTOMY     APPENDECTOMY     BIOPSY  12/28/2022   Procedure: BIOPSY;  Surgeon: Kriss Estefana DEL, DO;  Location: [facility] ENDOSCOPY;  Service: Gastroenterology;;   BREAST BIOPSY Right    BREAST SURGERY     CARPAL TUNNEL RELEASE     CHOLECYSTECTOMY     COLONOSCOPY WITH PROPOFOL  N/A 09/01/2015   Procedure: COLONOSCOPY WITH PROPOFOL ;  Surgeon: Lamar ONEIDA Holmes, MD;  Location: [facility] ENDOSCOPY;  Service: Endoscopy;  Laterality: N/A;   COLONOSCOPY WITH PROPOFOL  N/A 04/29/2021   Procedure: COLONOSCOPY WITH PROPOFOL ;  Surgeon: Dessa Reyes ORN, MD;  Location: [facility] ENDOSCOPY;  Service: Gastroenterology;  Laterality: N/A;   ESOPHAGOGASTRODUODENOSCOPY N/A 07/17/2019   Procedure: ESOPHAGOGASTRODUODENOSCOPY (EGD);  Surgeon: Janalyn Keene NOVAK, MD;  Location: [facility] ENDOSCOPY;  Service: Endoscopy;  Laterality: N/A;   ESOPHAGOGASTRODUODENOSCOPY N/A 12/28/2022   Procedure: ESOPHAGOGASTRODUODENOSCOPY (EGD);  Surgeon: Kriss Estefana DEL, DO;  Location: [facility];  Service: Gastroenterology;  Laterality: N/A;   ESOPHAGOGASTRODUODENOSCOPY (EGD) WITH PROPOFOL  N/A 04/29/2021   Procedure: ESOPHAGOGASTRODUODENOSCOPY (EGD) WITH PROPOFOL ;  Surgeon: Dessa Reyes ORN, MD;  Location: [facility] ENDOSCOPY;  Service: Gastroenterology;  Laterality: N/A;    ESOPHAGOGASTRODUODENOSCOPY (EGD) WITH PROPOFOL  N/A 09/21/2022   Procedure: ESOPHAGOGASTRODUODENOSCOPY (EGD) WITH PROPOFOL ;  Surgeon: Burnette Fallow, MD;  Location: [facility] ENDOSCOPY;  Service: Gastroenterology;  Laterality: N/A;   ESOPHAGOGASTRODUODENOSCOPY (EGD) WITH PROPOFOL  N/A 05/23/2023   Procedure: ESOPHAGOGASTRODUODENOSCOPY (EGD) WITH PROPOFOL ;  Surgeon: Burnette Fallow, MD;  Location: [facility] ENDOSCOPY;  Service: Gastroenterology;  Laterality: N/A;   GIVENS CAPSULE STUDY N/A 12/07/2023   Procedure: IMAGING PROCEDURE, GI TRACT, INTRALUMINAL, VIA CAPSULE;  Surgeon: Kriss Estefana DEL, DO;  Location: [facility] ENDOSCOPY;  Service: Gastroenterology;  Laterality: N/A;   IR KYPHO LUMBAR INC FX REDUCE BONE BX UNI/BIL CANNULATION INC/IMAGING  10/19/2022   IR RADIOLOGIST EVAL & MGMT  11/04/2022   KYPHOPLASTY     in lower back   REDUCTION MAMMAPLASTY Bilateral    SAVORY DILATION N/A 09/01/2015   Procedure: SAVORY DILATION;  Surgeon: Lamar ONEIDA Holmes, MD;  Location: [facility] ENDOSCOPY;  Service: Endoscopy;  Laterality: N/A;   TOTAL KNEE ARTHROPLASTY Right 10/31/2017   Procedure: RIGHT TOTAL KNEE ARTHROPLASTY;  Surgeon: Melodi Lerner, MD;  Location: [facility] ORS;  Service: Orthopedics;  Laterality: Right;    Social History   Socioeconomic History   Marital status: Married    Spouse name: Not on file   Number of children: Not on file   Years of education: Not on file   Highest education level: Not on file  Occupational History   Not on file  Tobacco Use   Smoking status: Never   Smokeless tobacco: Never  Vaping Use   Vaping status: Never Used  Substance and Sexual Activity   Alcohol use: Never   Drug use: Never   Sexual activity: Not Currently    Birth control/protection: None    Comment: intercourse age 40, less than 5 sexual partners  Other Topics Concern   Not on file  Social History Narrative   Not on file   Social Drivers of Health   Financial Resource Strain: Low Risk  (04/30/2024)   Received  from [facility]   Overall Financial Resource Strain (CARDIA)    Difficulty of Paying Living Expenses: Not hard at all  Food Insecurity: No Food Insecurity (04/30/2024)   Received from [facility]   Hunger Vital Sign    Within the past 12 months, you worried that your food would run out before you got the money to buy more.: Never true    Within the past 12 months, the food you bought just didn't last and you didn't have money to get more.: Never true  Transportation Needs: No Transportation Needs (04/30/2024)   Received from [facility] - Transportation    In the past 12 months, has lack of transportation kept you from medical appointments or from getting medications?: No    Lack of Transportation (Non-Medical): No  Physical Activity: Not on file  Stress: Not on file  Social Connections: Moderately Integrated (12/07/2023)   Social Connection and Isolation Panel    Frequency of Communication with Friends and Family: More than three times a week    Frequency of Social Gatherings with Friends and Family: Once a week    Attends Religious Services: More than 4 times per year    Active Member of Clubs or Organizations: No    Attends  Club or Organization Meetings: Never    Marital Status: Married  Catering Manager Violence: Not At Risk (12/06/2023)   Humiliation, Afraid, Rape, and Kick questionnaire    Fear of Current or Ex-Partner: No    Emotionally Abused: No    Physically Abused: No    Sexually Abused: No    Family History  Problem Relation Age of Onset   Breast cancer Maternal Grandmother        in 70's   Heart disease Neg Hx     Medications Prior to Admission  Medication Sig Dispense Refill Last Dose/Taking   acetaminophen  (TYLENOL ) 325 MG tablet Take 325-650 mg by mouth every 6 (six) hours as needed for headache or mild pain.   Taking As Needed   apixaban  (ELIQUIS ) 2.5 MG TABS tablet Take 2.5 mg by mouth at bedtime.    Taking   Iron -Vitamin C (VITRON-C) 65-125 MG TABS Take 1 tablet by mouth every evening.   Taking   MAGNESIUM PO Take 1 tablet by mouth in the morning and at bedtime.   Taking   PROLIA  60 MG/ML SOSY injection Inject 60 mg into the skin every 6 (six) months.   Taking   sucralfate  (CARAFATE ) 1 g tablet Take 1 g by mouth in the morning and at bedtime.   Taking   torsemide  (DEMADEX ) 5 MG tablet Take 5 mg by mouth daily as needed (fluid).   Taking As Needed   traMADol  (ULTRAM ) 50 MG tablet Take 50 mg by mouth in the morning.   Taking   triamterene -hydrochlorothiazide  (DYAZIDE ) 37.5-25 MG capsule Take 1 capsule by mouth daily. (Patient taking differently: Take 1 capsule by mouth in the morning and at bedtime.)   Taking Differently   verapamil (VERELAN) 120 MG 24 hr capsule Take 120 mg by mouth at bedtime.   Taking    Current Facility-Administered Medications  Medication Dose Route Frequency Provider Last Rate Last Admin   acetaminophen  (TYLENOL ) tablet 1,000 mg  1,000 mg Oral On Call to OR Sheldon Standing, MD       bupivacaine  liposome (EXPAREL ) 1.3 % injection 266 mg  20 mL Infiltration Once Sheldon Standing, MD       cefTRIAXone  (ROCEPHIN ) 2 g in sodium chloride  0.9 % 100 mL IVPB  2 g Intravenous On Call to OR Sheldon Standing, MD       chlorhexidine  (PERIDEX ) 0.12 % solution 15 mL  15 mL Mouth/Throat Once Cleotilde Butler Dade, MD       Or   Oral care mouth rinse  15 mL Mouth Rinse Once Cleotilde Butler Dade, MD       Chlorhexidine  Gluconate Cloth 2 % PADS 6 each  6 each Topical Once Sheldon Standing, MD       And   Chlorhexidine  Gluconate Cloth 2 % PADS 6 each  6 each Topical Once Demika Langenderfer, Standing, MD       dexamethasone  (DECADRON ) injection 4 mg  4 mg Intravenous On Call to OR Sheldon Standing, MD       feeding supplement (ENSURE PRE-SURGERY) liquid 592 mL  592 mL Oral Once Sheldon Standing, MD       gabapentin (NEURONTIN) capsule 300 mg  300 mg Oral On Call to OR Sheldon Standing, MD       lactated ringers  infusion    Intravenous Continuous Cleotilde Butler Dade, MD         Allergies  Allergen Reactions   Sulfa Antibiotics Anaphylaxis and Swelling   Dexlansoprazole Other (See Comments)  Syncope    Ace Inhibitors Cough   Oxycodone  Nausea Only   Penicillins Rash    Has patient had a PCN reaction causing immediate rash, facial/tongue/throat swelling, SOB or lightheadedness with hypotension: No Has patient had a PCN reaction causing severe rash involving mucus membranes or skin necrosis: No Has patient had a PCN reaction that required hospitalization: No Has patient had a PCN reaction occurring within the last 10 years: No If all of the above answers are NO, then may proceed with Cephalosporin use.     Ht 4' 10 (1.473 m)   Wt 76.7 kg   BMI 35.32 kg/m   Labs: No results found for this or any previous visit (from the past 48 hours).  Imaging / Studies: No results found.   Briant KYM Schultze, M.D., F.A.C.S. Gastrointestinal and Minimally Invasive Surgery [facility] [address], [address] [place], [place] [address] [phone] Main / Paging  08/01/2024 9:57 AM    Elspeth JAYSON Schultze

## 2024-08-01 NOTE — Anesthesia Postprocedure Evaluation (Signed)
 Anesthesia Post Note  Patient: Cheryl Goodman  Procedure(s) Performed: REPAIR, HERNIA, PARAESOPHAGEAL, ROBOT-ASSISTED     Patient location during evaluation: PACU Anesthesia Type: General Level of consciousness: awake and alert and oriented Pain management: pain level controlled Vital Signs Assessment: post-procedure vital signs reviewed and stable Respiratory status: nonlabored ventilation, respiratory function stable, spontaneous breathing and patient connected to nasal cannula oxygen Cardiovascular status: blood pressure returned to baseline and stable Postop Assessment: no apparent nausea or vomiting Anesthetic complications: no   No notable events documented.  Last Pain:  Vitals:   08/01/24 1654  TempSrc:   PainSc: Asleep                 Deagan Sevin A.

## 2024-08-01 NOTE — Transfer of Care (Signed)
 Immediate Anesthesia Transfer of Care Note  Patient: Cheryl Goodman  Procedure(s) Performed: REPAIR, HERNIA, PARAESOPHAGEAL, ROBOT-ASSISTED  Patient Location: PACU  Anesthesia Type:General  Level of Consciousness: drowsy and patient cooperative  Airway & Oxygen Therapy: Patient Spontanous Breathing  Post-op Assessment: Report given to RN and Post -op Vital signs reviewed and stable  Post vital signs: Reviewed and stable  Last Vitals:  Vitals Value Taken Time  BP 137/67 08/01/24 15:06  Temp 36.4 C 08/01/24 15:06  Pulse 94 08/01/24 15:06  Resp 10 08/01/24 15:08  SpO2 95 % 08/01/24 15:06  Vitals shown include unfiled device data.  Last Pain:  Vitals:   08/01/24 1506  TempSrc: Oral         Complications: No notable events documented.

## 2024-08-02 ENCOUNTER — Inpatient Hospital Stay (HOSPITAL_COMMUNITY)

## 2024-08-02 ENCOUNTER — Encounter (HOSPITAL_COMMUNITY): Payer: Self-pay | Admitting: Surgery

## 2024-08-02 DIAGNOSIS — K224 Dyskinesia of esophagus: Secondary | ICD-10-CM | POA: Diagnosis not present

## 2024-08-02 DIAGNOSIS — E1122 Type 2 diabetes mellitus with diabetic chronic kidney disease: Secondary | ICD-10-CM | POA: Diagnosis not present

## 2024-08-02 DIAGNOSIS — N1831 Chronic kidney disease, stage 3a: Secondary | ICD-10-CM | POA: Diagnosis not present

## 2024-08-02 DIAGNOSIS — E66812 Obesity, class 2: Secondary | ICD-10-CM | POA: Diagnosis not present

## 2024-08-02 DIAGNOSIS — Z6835 Body mass index (BMI) 35.0-35.9, adult: Secondary | ICD-10-CM | POA: Diagnosis not present

## 2024-08-02 DIAGNOSIS — K440 Diaphragmatic hernia with obstruction, without gangrene: Secondary | ICD-10-CM | POA: Diagnosis not present

## 2024-08-02 DIAGNOSIS — M6281 Muscle weakness (generalized): Secondary | ICD-10-CM | POA: Diagnosis not present

## 2024-08-02 DIAGNOSIS — K449 Diaphragmatic hernia without obstruction or gangrene: Secondary | ICD-10-CM | POA: Diagnosis not present

## 2024-08-02 DIAGNOSIS — G4733 Obstructive sleep apnea (adult) (pediatric): Secondary | ICD-10-CM | POA: Diagnosis not present

## 2024-08-02 DIAGNOSIS — I129 Hypertensive chronic kidney disease with stage 1 through stage 4 chronic kidney disease, or unspecified chronic kidney disease: Secondary | ICD-10-CM | POA: Diagnosis not present

## 2024-08-02 LAB — CBC
HCT: 33.7 % — ABNORMAL LOW (ref 36.0–46.0)
Hemoglobin: 10.2 g/dL — ABNORMAL LOW (ref 12.0–15.0)
MCH: 29.4 pg (ref 26.0–34.0)
MCHC: 30.3 g/dL (ref 30.0–36.0)
MCV: 97.1 fL (ref 80.0–100.0)
Platelets: 225 K/uL (ref 150–400)
RBC: 3.47 MIL/uL — ABNORMAL LOW (ref 3.87–5.11)
RDW: 14.4 % (ref 11.5–15.5)
WBC: 17.1 K/uL — ABNORMAL HIGH (ref 4.0–10.5)
nRBC: 0 % (ref 0.0–0.2)

## 2024-08-02 LAB — BASIC METABOLIC PANEL WITH GFR
Anion gap: 11 (ref 5–15)
BUN: 16 mg/dL (ref 8–23)
CO2: 25 mmol/L (ref 22–32)
Calcium: 9.3 mg/dL (ref 8.9–10.3)
Chloride: 106 mmol/L (ref 98–111)
Creatinine, Ser: 1.33 mg/dL — ABNORMAL HIGH (ref 0.44–1.00)
GFR, Estimated: 40 mL/min — ABNORMAL LOW (ref >60)
Glucose, Bld: 137 mg/dL — ABNORMAL HIGH (ref 70–99)
Potassium: 4.3 mmol/L (ref 3.5–5.1)
Sodium: 142 mmol/L (ref 135–145)

## 2024-08-02 LAB — MAGNESIUM: Magnesium: 2.3 mg/dL (ref 1.7–2.4)

## 2024-08-02 LAB — PHOSPHORUS: Phosphorus: 3.2 mg/dL (ref 2.5–4.6)

## 2024-08-02 LAB — PREALBUMIN: Prealbumin: 15 mg/dL — ABNORMAL LOW (ref 18–38)

## 2024-08-02 MED ORDER — IOHEXOL 300 MG/ML  SOLN
100.0000 mL | Freq: Once | INTRAMUSCULAR | Status: AC | PRN
Start: 2024-08-02 — End: 2024-08-02
  Administered 2024-08-02: 100 mL via ORAL

## 2024-08-02 MED ORDER — SODIUM CHLORIDE 0.9 % IV SOLN: 250.0000 mL | INTRAVENOUS | Status: DC | PRN

## 2024-08-02 MED ORDER — ENOXAPARIN SODIUM 30 MG/0.3ML IJ SOSY
30.0000 mg | PREFILLED_SYRINGE | INTRAMUSCULAR | Status: DC
  Filled 2024-08-02: qty 0.3

## 2024-08-02 MED ORDER — SODIUM CHLORIDE 0.9% FLUSH: 3.0000 mL | INTRAVENOUS | Status: DC | PRN

## 2024-08-02 MED ORDER — ENSURE SURGERY PO LIQD
237.0000 mL | Freq: Two times a day (BID) | ORAL | Status: DC
Start: 2024-08-02 — End: 2024-08-03
  Administered 2024-08-02 – 2024-08-03 (×3): 237 mL via ORAL

## 2024-08-02 MED ORDER — DEXAMETHASONE SOD PHOSPHATE PF 10 MG/ML IJ SOLN
8.0000 mg | Freq: Two times a day (BID) | INTRAMUSCULAR | Status: DC
  Administered 2024-08-02 – 2024-08-03 (×3): 8 mg via INTRAVENOUS

## 2024-08-02 MED ORDER — FUROSEMIDE 10 MG/ML IJ SOLN
40.0000 mg | Freq: Once | INTRAMUSCULAR | Status: AC
  Administered 2024-08-02: 40 mg via INTRAVENOUS
  Filled 2024-08-02: qty 4

## 2024-08-02 MED ORDER — SUCRALFATE 1 GM/10ML PO SUSP
1.0000 g | Freq: Three times a day (TID) | ORAL | Status: DC
  Administered 2024-08-02 – 2024-08-03 (×4): 1 g via ORAL
  Filled 2024-08-02 (×4): qty 10

## 2024-08-02 MED ORDER — SODIUM CHLORIDE 0.9% FLUSH
3.0000 mL | Freq: Two times a day (BID) | INTRAVENOUS | Status: DC
  Administered 2024-08-02 (×2): 3 mL via INTRAVENOUS

## 2024-08-02 NOTE — Progress Notes (Signed)
 08/02/2024  Cheryl Goodman [id number] Oct 03, 1941  CARE TEAM: PCP: Cleotilde Oneil FALCON, MD  Outpatient Care Team: Patient Care Team: Cleotilde Oneil FALCON, MD as PCP - General (Internal Medicine) Clista Bimler, MD as Consulting Physician (Oncology) Florencio Cara BIRCH, MD as Consulting Physician (Cardiology) Sheldon Standing, MD as Consulting Physician (General Surgery) Burnette Fallow, MD as Consulting Physician (Gastroenterology)  Inpatient Treatment Team: Treatment Team:  Sheldon Standing, MD Cesario Mylinda KIDD, RN [place], [place], [place], [place], [place]   Problem List:   Principal Problem:   Incarcerated hiatal hernia Active Problems:   Obstructive sleep apnea (adult) (pediatric)   Chronic kidney disease, stage 3a (HCC)   History of pulmonary embolism   Chronic anticoagulation   DM type 2 with diabetic mixed hyperlipidemia (HCC)   Mesenteroaxial gastric volvulus   Obesity, Class II, BMI 35-39.9   Chronic Cameron ulcer   08/01/2024  POST-OPERATIVE DIAGNOSIS:  PARAESOPHAGEAL HIATAL HERNIA WITH CHRONIC VOLVULUS REFRACTORY TO MEDICAL MANAGEMENT   PROCEDURE:   1. ROBOTIC reduction of paraesophageal hiatal hernia 2. Type II mediastinal dissection. 3. Primary repair of hiatal hernia over pledgets.  4. Anterior & posterior gastropexy. 5. Toupet (270 degree partial posterior x 4cm)  fundoplication 6. Mesh reinforcement with absorbable mesh 7.  Splenic flexure mobilization   SURGEON:  Standing KYM Sheldon, MD   OR FINDINGS:   Large paraesophageal hiatal hernia with 90% of the stomach along with central omentum and mid transverse colon in the mediastinum.  There was a 12 x 12 cm hiatal defect.   Bilateral crural releases required.  It is a primary repair over pledgets.  Mesh reinforcement was used with GORE BIO-A mesh, a biosynthetic web scaffold made of 67% polyglycolic acid (PGA): 33% trimethylene carbonate (TMC).   The patient has a Toupet (270 degree partial posterior x  4cm)  fundoplication.  The patient has had anterior and posterior gastropexy.   Assessment [facility] Stay = 1 days) 1 Day Post-Op    Stable    Plan:  Clear liquids  Esophagram this morning.  If no leak/obstruction advance to dysphagia 1/pured diet.  Continue drain in mediastinum given her chronic edema and effusions.  May need to go home with that but try and see if we can get rid of it before she is discharged.  GERD.  Cannot tolerate PPIs.  Will restart Carafate  and follow-up.  Blood pressure control.  Just to valsartan only with as needed breakthrough.  Diuresis follow.  -monitor electrolytes & replace as needed  Keep K>4, Mg>2, Phos>3  Recheck creatinine.  Continue to be elevated consistent with chronic kidney disease.  If that is stable then get diuresis Lasix   -VTE prophylaxis- SCDs.  Anticoagulation prophyllaxis SQ as appropriate  -mobilize as tolerated to help recovery.  Enlist therapies in moderate/high risk patients as appropriate  I updated the patient's status to the patient  Recommendations were made.  Questions were answered.  She expressed understanding & appreciation.  -Disposition:  Disposition:  The patient is from: Home Anticipate discharge to:  Home with Home Health Anticipated Date of Discharge is:  October 31,2025   Barriers to discharge:  Pending Clinical improvement (more likely than not) and Testing result pending  Patient currently is NOT MEDICALLY STABLE for discharge from the hospital from a surgery standpoint.      I reviewed last 24 h vitals and pain scores, last 48 h intake and output, last 24 h labs and trends, and last 24 h imaging results.  I have reviewed this  patient's available data, including medical history, events of note, test results, etc as part of my evaluation.   A significant portion of that time was spent in counseling. Care during the described time interval was provided by me.  This care required moderate level of  medical decision making.  08/02/2024    Subjective: (Chief complaint)  Patient resting.  Some soreness when she coughs.  No nausea or vomiting.  Tolerating liquids.  Objective:  Vital signs:  Vitals:   08/01/24 1806 08/01/24 1855 08/01/24 2100 08/02/24 0411  BP: 131/78 139/81 130/73 (!) 115/55  Pulse: 82 78 77 70  Resp: 16 16  16   Temp: 97.6 F (36.4 C) 97.6 F (36.4 C) 97.6 F (36.4 C) 98.2 F (36.8 C)  TempSrc: Oral Oral Oral Oral  SpO2: 100% 100% 99% 99%  Weight:      Height:           Intake/Output   Yesterday:  10/29 0701 - 10/30 0700 In: 3460 [P.O.:240; I.V.:2720; IV Piggyback:500] Out: 975 [Urine:600; Drains:350; Blood:25] This shift:  Total I/O In: 520 [I.V.:520] Out: 585 [Urine:500; Drains:85]  Bowel function:  Flatus: YES  BM:  No  Drain: Mostly serous.  Scant pink tinge   Physical Exam:  General: Pt awake/alert in no acute distress Eyes: PERRL, normal EOM.  Sclera clear.  No icterus Neuro: CN II-XII intact w/o focal sensory/motor deficits. Lymph: No head/neck/groin lymphadenopathy Psych:  No delerium/psychosis/paranoia.  Oriented x 4 HENT: Normocephalic, Mucus membranes moist.  No thrush Neck: Supple, No tracheal deviation.  No obvious thyromegaly Chest: No pain to chest wall compression.  Good respiratory excursion.  No audible wheezing CV:  Pulses intact.  Regular rhythm.  No major extremity edema MS: Normal AROM mjr joints.  No obvious deformity  Abdomen: Soft.  Nondistended.  Mildly tender at incisions only.  No evidence of peritonitis.  No incarcerated hernias.  Ext:  No deformity.  No mjr edema.  No cyanosis Skin: No petechiae / purpurea.  No major sores.  Warm and dry    Results:   Cultures: No results found for this or any previous visit (from the past 720 hours).  Labs: No results found for this or any previous visit (from the past 48 hours).  Imaging / Studies: No results found.  Medications / Allergies: per  chart  Antibiotics: Anti-infectives (From admission, onward)    Start     Dose/Rate Route Frequency Ordered Stop   08/01/24 1000  cefTRIAXone  (ROCEPHIN ) 2 g in sodium chloride  0.9 % 100 mL IVPB        2 g 200 mL/hr over 30 Minutes Intravenous On call to O.R. 08/01/24 0947 08/01/24 1135         Note: Portions of this report may have been transcribed using voice recognition software. Every effort was made to ensure accuracy; however, inadvertent computerized transcription errors may be present.   Any transcriptional errors that result from this process are unintentional.    Elspeth KYM Schultze, MD, FACS, MASCRS Esophageal, Gastrointestinal & Colorectal Surgery Robotic and Minimally Invasive Surgery  [facility] [address], [address] [place], [place] [address] [phone] Fax [phone] Main  CONTACT INFORMATION: Weekday (9AM-5PM): Call [facility] main office at [phone] Weeknight (5PM-9AM) or Weekend/Holiday: Check EPIC Web Links tab & use AMION (password  [email or web address]) for General Surgery [facility] coverage  Please, DO NOT use SecureChat  (it is not reliable communication to reach operating surgeons & will  lead to a delay in care).   Epic staff messaging available for outpatient concerns needing 1-2 business day response.      08/02/2024  6:46 AM

## 2024-08-02 NOTE — Care Management CC44 (Signed)
 Condition Code 44 Documentation Completed  Patient Details  Name: SERIAH BROTZMAN MRN: [id number] Date of Birth: 1940/10/13   Condition Code 44 given:  Yes Patient signature on Condition Code 44 notice:  Yes Documentation of 2 MD's agreement:  Yes Code 44 added to claim:  Yes    Meliton Samad, LCSW 08/02/2024, 2:32 PM

## 2024-08-02 NOTE — Care Management Obs Status (Signed)
 MEDICARE OBSERVATION STATUS NOTIFICATION   Patient Details  Name: Cheryl Goodman MRN: [id number] Date of Birth: [birth year]   Medicare Observation Status Notification Given:  [name], LCSW 08/02/2024, 2:31 PM

## 2024-08-02 NOTE — Evaluation (Signed)
 Physical Therapy Evaluation Patient Details Name: Cheryl Goodman MRN: [id number] DOB: 09/03/1941 Today's Date: 08/02/2024  History of Present Illness  Pt is 83 yo female admitted with paraesophageal hiatal hernia and s/p robotic reduction of hernia on 08/02/24.  Pt with hx including but not limited to CKD, GIB, CVA, OSA, OA, OP, R TKA  Clinical Impression  Pt admitted with above diagnosis. Pt lives with spouse and has DME. At baseline, pt is independent.  Today, pt somewhat apprehensive about mobility but agreeable with education on importance of early mobility.  She required light min A for transfers and ambulated 25' with RW and CGA, declined further ambulation for first bout.  Pt expected to progress well and likely no PT needs at d/c.  Pt currently with functional limitations due to the deficits listed below (see PT Problem List). Pt will benefit from acute skilled PT to increase their independence and safety with mobility to allow discharge.           If plan is discharge home, recommend the following: A little help with walking and/or transfers;A little help with bathing/dressing/bathroom;Assistance with cooking/housework;Help with stairs or ramp for entrance   Can travel by private vehicle        Equipment Recommendations None recommended by PT  Recommendations for Other Services       Functional Status Assessment Patient has had a recent decline in their functional status and demonstrates the ability to make significant improvements in function in a reasonable and predictable amount of time.     Precautions / Restrictions Precautions Precautions: Fall Precaution/Restrictions Comments: JP drain      Mobility  Bed Mobility Overal bed mobility: Needs Assistance Bed Mobility: Supine to Sit     Supine to sit: Min assist          Transfers Overall transfer level: Needs assistance Equipment used: Rolling walker (2 wheels) Transfers: Sit to/from Stand, Bed to  chair/wheelchair/BSC Sit to Stand: Contact guard assist   Step pivot transfers: Contact guard assist       General transfer comment: CGA for safety; performed ADLs wtih CGA for safety    Ambulation/Gait Ambulation/Gait assistance: Contact guard assist Gait Distance (Feet): 25 Feet Assistive device: Rolling walker (2 wheels) Gait Pattern/deviations: Step-through pattern, Decreased stride length Gait velocity: decreased but functional     General Gait Details: Pt reports easily fatigued, wants to stay in room first bout of ambulation, min cues for RW, steady wtih RW  Stairs            Wheelchair Mobility     Tilt Bed    Modified Rankin (Stroke Patients Only)       Balance Overall balance assessment: Needs assistance Sitting-balance support: No upper extremity supported Sitting balance-Leahy Scale: Good     Standing balance support: No upper extremity supported, Bilateral upper extremity supported Standing balance-Leahy Scale: Fair Standing balance comment: RW to ambulate but could stand without UE support                             Pertinent Vitals/Pain Pain Assessment Pain Assessment: Faces Faces Pain Scale: Hurts little more Pain Location: abdomen Pain Descriptors / Indicators: Discomfort, Sore Pain Intervention(s): Limited activity within patient's tolerance, Monitored during session, Premedicated before session, Repositioned    Home Living Family/patient expects to be discharged to:: Private residence Living Arrangements: Spouse/significant other Available Help at Discharge: Family;Available 24 hours/day Type of Home: House Home Access: Stairs  to enter Entrance Stairs-Rails: Right Entrance Stairs-Number of Steps: 4   Home Layout: Two level;Able to live on main level with bedroom/bathroom Home Equipment: Rollator (4 wheels);Rolling Walker (2 wheels);Cane - single point;Shower seat Additional Comments: Does have step stool to get on bed  - can hold bed rail for assist    Prior Function Prior Level of Function : Independent/Modified Independent;Driving             Mobility Comments: could ambulate in community without ad ADLs Comments: independent adls and iadls     Extremity/Trunk Assessment   Upper Extremity Assessment Upper Extremity Assessment: Overall WFL for tasks assessed    Lower Extremity Assessment Lower Extremity Assessment: Overall WFL for tasks assessed (Did not MMT due to abdominal sx)    Cervical / Trunk Assessment Cervical / Trunk Assessment: Normal  Communication        Cognition Arousal: Alert Behavior During Therapy: WFL for tasks assessed/performed   PT - Cognitive impairments: No apparent impairments                       PT - Cognition Comments: apphensive about mobility         Cueing       General Comments      Exercises     Assessment/Plan    PT Assessment Patient needs continued PT services  PT Problem List Decreased mobility;Decreased strength;Decreased range of motion;Decreased activity tolerance;Decreased balance;Decreased knowledge of use of DME       PT Treatment Interventions DME instruction;Therapeutic exercise;Gait training;Stair training;Functional mobility training;Therapeutic activities;Patient/family education;Balance training    PT Goals (Current goals can be found in the Care Plan section)  Acute Rehab PT Goals Patient Stated Goal: return home PT Goal Formulation: With patient/family Time For Goal Achievement: 08/17/24 Potential to Achieve Goals: Good    Frequency Min 2X/week     Co-evaluation               AM-PAC PT 6 Clicks Mobility  Outcome Measure Help needed turning from your back to your side while in a flat bed without using bedrails?: A Little Help needed moving from lying on your back to sitting on the side of a flat bed without using bedrails?: A Little Help needed moving to and from a bed to a chair (including  a wheelchair)?: A Little Help needed standing up from a chair using your arms (e.g., wheelchair or bedside chair)?: A Little Help needed to walk in hospital room?: A Little Help needed climbing 3-5 steps with a railing? : A Little 6 Click Score: 18    End of Session Equipment Utilized During Treatment: Gait belt Activity Tolerance: Patient tolerated treatment well Patient left: in chair;with call bell/phone within reach;with family/visitor present Nurse Communication: Mobility status PT Visit Diagnosis: Other abnormalities of gait and mobility (R26.89);Muscle weakness (generalized) (M62.81)    Time: 8451-8389 PT Time Calculation (min) (ACUTE ONLY): 22 min   Charges:                 Foxx Klarich, PT Acute Rehab [facility] [phone]   Benjiman VEAR Mulberry 08/02/2024, 5:05 PM

## 2024-08-03 DIAGNOSIS — N1831 Chronic kidney disease, stage 3a: Secondary | ICD-10-CM | POA: Diagnosis not present

## 2024-08-03 DIAGNOSIS — K254 Chronic or unspecified gastric ulcer with hemorrhage: Secondary | ICD-10-CM | POA: Diagnosis not present

## 2024-08-03 DIAGNOSIS — M6281 Muscle weakness (generalized): Secondary | ICD-10-CM | POA: Diagnosis not present

## 2024-08-03 DIAGNOSIS — E66812 Obesity, class 2: Secondary | ICD-10-CM | POA: Diagnosis not present

## 2024-08-03 DIAGNOSIS — Z6835 Body mass index (BMI) 35.0-35.9, adult: Secondary | ICD-10-CM | POA: Diagnosis not present

## 2024-08-03 DIAGNOSIS — E1122 Type 2 diabetes mellitus with diabetic chronic kidney disease: Secondary | ICD-10-CM | POA: Diagnosis not present

## 2024-08-03 DIAGNOSIS — K440 Diaphragmatic hernia with obstruction, without gangrene: Secondary | ICD-10-CM | POA: Diagnosis not present

## 2024-08-03 DIAGNOSIS — I129 Hypertensive chronic kidney disease with stage 1 through stage 4 chronic kidney disease, or unspecified chronic kidney disease: Secondary | ICD-10-CM | POA: Diagnosis not present

## 2024-08-03 DIAGNOSIS — G4733 Obstructive sleep apnea (adult) (pediatric): Secondary | ICD-10-CM | POA: Diagnosis not present

## 2024-08-03 LAB — CREATININE, SERUM
Creatinine, Ser: 1.36 mg/dL — ABNORMAL HIGH (ref 0.44–1.00)
GFR, Estimated: 38 mL/min — ABNORMAL LOW (ref >60)

## 2024-08-03 LAB — HEMOGLOBIN: Hemoglobin: 9 g/dL — ABNORMAL LOW (ref 12.0–15.0)

## 2024-08-03 LAB — POTASSIUM: Potassium: 4.4 mmol/L (ref 3.5–5.1)

## 2024-08-03 MED ORDER — TORSEMIDE 10 MG PO TABS
5.0000 mg | ORAL_TABLET | Freq: Once | ORAL | Status: AC
  Administered 2024-08-03: 5 mg via ORAL
  Filled 2024-08-03: qty 0.5

## 2024-08-03 NOTE — Plan of Care (Signed)

## 2024-08-03 NOTE — Discharge Summary (Signed)
 Physician Discharge Summary    Cheryl Goodman MRN: [id number] DOB/AGE: 05-Oct-1940 = 83 y.o.  Patient Care Team: Cleotilde Oneil FALCON, MD as PCP - General (Internal Medicine) Clista Bimler, MD as Consulting Physician (Oncology) Florencio Cara BIRCH, MD as Consulting Physician (Cardiology) Sheldon Standing, MD as Consulting Physician (General Surgery) Burnette Fallow, MD as Consulting Physician (Gastroenterology)  Admit date: 08/01/2024  Discharge date: 08/03/2024  Hospital Stay = 1 days    Discharge Diagnoses:  Principal Problem:   Incarcerated hiatal hernia Active Problems:   Obstructive sleep apnea (adult) (pediatric)   Chronic kidney disease, stage 3a (HCC)   History of pulmonary embolism   Chronic anticoagulation   DM type 2 with diabetic mixed hyperlipidemia (HCC)   Mesenteroaxial gastric volvulus   Obesity, Class II, BMI 35-39.9   Chronic Cameron ulcer   2 Days Post-Op  08/01/2024  POST-OPERATIVE DIAGNOSIS:   PARAESOPHAGEAL HIATAL HERNIA REFRACTORY TO MEDICAL MANAGEMENT  SURGERY:  08/01/2024  Procedure(s): REPAIR, HERNIA, PARAESOPHAGEAL, ROBOT-ASSISTED  SURGEON:    Surgeon(s): Sheldon Standing, MD  Consults: Case Management / Social Work, Physical Therapy, Occupational Therapy, Pharmacy, Nutrition, and Anesthesia  Hospital Course:   The patient underwent the surgery above.  Postoperatively, the patient gradually mobilized and advanced to a pureed diet.  Nutrition consult given.  Instructions on pured diet for the first 2 weeks discussed.  Drain serosanguineous over pain and other symptoms were treated aggressively.    By the time of discharge, the patient was walking well the hallways, eating food, having flatus.  Pain was well-controlled on an oral medications.  Based on meeting discharge criteria and continuing to recover, I felt it was safe for the patient to be discharged from the hospital to further recover with close followup. Postoperative  recommendations were discussed in detail.  They are written as well.  Discharged Condition: stable  Discharge Exam: Blood pressure 126/72, pulse 76, temperature 97.6 F (36.4 C), temperature source Oral, resp. rate 18, height 4' 10 (1.473 m), weight 76.7 kg, SpO2 94%.  General: Pt awake/alert/oriented x4 in No acute distress Eyes: PERRL, normal EOM.  Sclera clear.  No icterus Neuro: CN II-XII intact w/o focal sensory/motor deficits. Lymph: No head/neck/groin lymphadenopathy Psych:  No delerium/psychosis/paranoia HENT: Normocephalic, Mucus membranes moist.  No thrush Neck: Supple, No tracheal deviation Chest:  No chest wall pain w good excursion CV:  Pulses intact.  Regular rhythm MS: Normal AROM mjr joints.  No obvious deformity Abdomen: Soft.  Nondistended.  Mildly tender at incisions only.  Drain serous.  No evidence of peritonitis.  No incarcerated hernias. Ext:  SCDs BLE.  No mjr edema.  No cyanosis Skin: No petechiae / purpura   Disposition:    Follow-up Information     Sheldon Standing, MD Follow up on 08/28/2024.   Specialties: General Surgery, Colon and Rectal Surgery Why: To follow up after your operation Contact information: [address] [place] [address] [phone]                 Discharge disposition: 06-Home-Health Care Svc       Discharge Instructions     Call MD for:   Complete by: As directed    Temperature > 101.60F   Call MD for:  extreme fatigue   Complete by: As directed    Call MD for:  hives   Complete by: As directed    Call MD for:  persistant nausea and vomiting   Complete by: As directed  Call MD for:  redness, tenderness, or signs of infection (pain, swelling, redness, odor or green/yellow discharge around incision site)   Complete by: As directed    Call MD for:  severe uncontrolled pain   Complete by: As directed    Diet general   Complete by: As directed    SEE ESOPHAGEAL SURGERY DIET  INSTRUCTIONS  We using usually start you out on a pureed (blenderized) diet. Expect some sticking with swallowing over the next 1-2 months.   This is due to swelling around your esophagus at the wrap & hiatal diaphragm repair.  It will gradually ease off over the next few months.   Discharge instructions   Complete by: As directed    Please see discharge instruction sheets.   Also refer to any handouts/printouts that may have been given from the [facility] surgery office (if you visited us  there before surgery) Please call our office if you have any questions or concerns [phone]   Driving Restrictions   Complete by: As directed    No driving until off narcotics and can safely swerve away without pain during an emergency   Increase activity slowly   Complete by: As directed    Lifting restrictions   Complete by: As directed    Avoid heavy lifting initially, <20 pounds at first.   Do not push through pain.   You have no specific weight limit: If it hurts to do, DON'T DO IT.    If you feel no pain, you are not injuring anything.  Pain will protect you from injury.   Coughing and sneezing are far more stressful to your incision than any lifting.   Avoid resuming heavy lifting (>50 pounds) or other intense activity until off all narcotic pain medications.   When want to exercise more, give yourself 2 weeks to gradually get back to full intense exercise/activity.   May shower / Bathe   Complete by: As directed    SHOWER EVERY DAY.  It is fine for dressings or wounds to be washed/rinsed.  Use gentle soap & water.  This will help the incisions and/or wounds get clean & minimize infection.   May walk up steps   Complete by: As directed    Remove dressing in 48 hours   Complete by: As directed    Sexual Activity Restrictions   Complete by: As directed    Sexual activity as tolerated.  Do not push through pain.  Pain will protect you from injury.   Walk with assistance   Complete by: As  directed    Walk over an hour a day.  May use a walker/cane/companion to help with balance and stamina.       Allergies as of 08/03/2024       Reactions   Sulfa Antibiotics Anaphylaxis, Swelling   Dexlansoprazole Other (See Comments)   Syncope   Ace Inhibitors Cough   Oxycodone  Nausea Only   Penicillins Rash   Has patient had a PCN reaction causing immediate rash, facial/tongue/throat swelling, SOB or lightheadedness with hypotension: No Has patient had a PCN reaction causing severe rash involving mucus membranes or skin necrosis: No Has patient had a PCN reaction that required hospitalization: No Has patient had a PCN reaction occurring within the last 10 years: No If all of the above answers are NO, then may proceed with Cephalosporin use.        Medication List     TAKE these medications    acetaminophen  325  MG tablet Commonly known as: TYLENOL  Take 325-650 mg by mouth every 6 (six) hours as needed for headache or mild pain.   Eliquis  2.5 MG Tabs tablet Generic drug: apixaban  Take 2.5 mg by mouth at bedtime.   MAGNESIUM PO Take 1 tablet by mouth in the morning and at bedtime.   ondansetron  4 MG tablet Commonly known as: ZOFRAN  Take 1 tablet (4 mg total) by mouth every 8 (eight) hours as needed for nausea.   Prolia  60 MG/ML Sosy injection Generic drug: denosumab  Inject 60 mg into the skin every 6 (six) months.   sucralfate  1 g tablet Commonly known as: CARAFATE  Take 1 g by mouth in the morning and at bedtime.   torsemide  5 MG tablet Commonly known as: DEMADEX  Take 5 mg by mouth daily as needed (fluid).   traMADol  50 MG tablet Commonly known as: ULTRAM  Take 1-2 tablets (50-100 mg total) by mouth every 6 (six) hours as needed. What changed:  how much to take when to take this reasons to take this   triamterene -hydrochlorothiazide  37.5-25 MG capsule Commonly known as: DYAZIDE  Take 1 capsule by mouth daily. What changed: when to take this    verapamil 120 MG 24 hr capsule Commonly known as: VERELAN Take 120 mg by mouth at bedtime.   Vitron-C 65-125 MG Tabs Generic drug: Iron -Vitamin C Take 1 tablet by mouth every evening.        Significant Diagnostic Studies:  Results for orders placed or performed during the hospital encounter of 08/01/24 (from the past 72 hours)  Prealbumin     Status: Abnormal   Collection Time: 08/02/24 11:14 AM  Result Value Ref Range   Prealbumin 15 (L) 18 - 38 mg/dL    Comment: Performed at [facility], [address] [place], [place] [address]  CBC     Status: Abnormal   Collection Time: 08/02/24 11:14 AM  Result Value Ref Range   WBC 17.1 (H) 4.0 - 10.5 K/uL   RBC 3.47 (L) 3.87 - 5.11 MIL/uL   Hemoglobin 10.2 (L) 12.0 - 15.0 g/dL   HCT 66.2 (L) 63.9 - 53.9 %   MCV 97.1 80.0 - 100.0 fL   MCH 29.4 26.0 - 34.0 pg   MCHC 30.3 30.0 - 36.0 g/dL   RDW 85.5 88.4 - 84.4 %   Platelets 225 150 - 400 K/uL   nRBC 0.0 0.0 - 0.2 %    Comment: Performed at [facility], [address] [place], [place] [address]  Basic metabolic panel with GFR     Status: Abnormal   Collection Time: 08/02/24  1:28 PM  Result Value Ref Range   Sodium 142 135 - 145 mmol/L   Potassium 4.3 3.5 - 5.1 mmol/L   Chloride 106 98 - 111 mmol/L   CO2 25 22 - 32 mmol/L   Glucose, Bld 137 (H) 70 - 99 mg/dL    Comment: Glucose reference range applies only to samples taken after fasting for at least 8 hours.   BUN 16 8 - 23 mg/dL   Creatinine, Ser 8.66 (H) 0.44 - 1.00 mg/dL   Calcium 9.3 8.9 - 89.6 mg/dL   GFR, Estimated 40 (L) >60 mL/min    Comment: (NOTE) Calculated using the CKD-EPI Creatinine Equation (2021)    Anion gap 11 5 - 15    Comment: Performed at [facility], [address], [place], [place] [address]  Magnesium     Status: None  Collection Time: 08/02/24  1:28 PM  Result Value Ref Range   Magnesium 2.3 1.7 - 2.4 mg/dL    Comment: Performed at [facility], [address], [place], [place] [address]  Phosphorus     Status: None   Collection Time: 08/02/24  1:28 PM  Result Value Ref Range   Phosphorus 3.2 2.5 - 4.6 mg/dL    Comment: Performed at [facility], [address] [place], [place] [address]  Hemoglobin     Status: Abnormal   Collection Time: 08/03/24  5:06 AM  Result Value Ref Range   Hemoglobin 9.0 (L) 12.0 - 15.0 g/dL    Comment: Performed at [facility], [address], [place], [place] [address]  Potassium     Status: None   Collection Time: 08/03/24  5:06 AM  Result Value Ref Range   Potassium 4.4 3.5 - 5.1 mmol/L    Comment: Performed at [facility], [address], [place], [place] [address]  Creatinine, serum     Status: Abnormal   Collection Time: 08/03/24  5:06 AM  Result Value Ref Range   Creatinine, Ser 1.36 (H) 0.44 - 1.00 mg/dL   GFR, Estimated 38 (L) >60 mL/min    Comment: (NOTE) Calculated using the CKD-EPI Creatinine Equation (2021) Performed at [facility], [address], [place], [place] [address]     DG ESOPHAGUS W SINGLE CM (SOL OR THIN BA) Result Date: 08/02/2024 CLINICAL DATA:  Postop repair of paraesophageal hiatal hernia and laparoscopic fundoplication. EXAM: ESOPHOGRAM/BARIUM SWALLOW TECHNIQUE: Single contrast examination was performed using 100 cc Omnipaque  300 orally. FLUOROSCOPY: Radiation Exposure Index (as provided by the fluoroscopic device): 22.6 mGy Kerma COMPARISON:  Preoperative esophagram 04/23/2024. Abdominopelvic CT 05/21/2023. FINDINGS: Limited examination was performed using water-soluble contrast in the semi erect, right lateral decubitus and supine positions. The patient swallowed the contrast without difficulty. No aspiration was observed. Moderate to severe esophageal dysmotility again noted, similar to the preoperative examination. There are postsurgical changes at the gastroesophageal  junction consistent with interval repair of the paraesophageal hernia. No residual hernia identified. The gastroesophageal junction is patent. There is no mucosal irregularity or contrast leak. Contrast flows freely into the stomach and proximal duodenum. A surgical drain is in place. IMPRESSION: 1. Expected postsurgical findings from recent paraesophageal hiatal hernia repair and fundoplication. 2. No evidence of contrast leak or esophageal obstruction. 3. Moderate to severe esophageal dysmotility. Electronically Signed   By: Elsie Perone M.D.   On: 08/02/2024 09:15    Past Medical History:  Diagnosis Date   Acute pulmonary embolism with acute cor pulmonale (HCC) 08/10/2019   Arthritis    Dysphagia    Edema    in legs   GERD (gastroesophageal reflux disease)    History of hiatal hernia    History of kidney stones    x2 ; passed independently   Hyperlipidemia    Hypertension    Nausea    OA (osteoarthritis) of knee 10/31/2017   Pneumonia    Stroke [facility])    mini stroke    Past Surgical History:  Procedure Laterality Date   ABDOMINAL HYSTERECTOMY     APPENDECTOMY     BIOPSY  12/28/2022   Procedure: BIOPSY;  Surgeon: Kriss Estefana DEL, DO;  Location: [facility] ENDOSCOPY;  Service: Gastroenterology;;   BREAST BIOPSY Right    BREAST SURGERY     CARPAL TUNNEL RELEASE     CHOLECYSTECTOMY  COLONOSCOPY WITH PROPOFOL  N/A 09/01/2015   Procedure: COLONOSCOPY WITH PROPOFOL ;  Surgeon: Lamar ONEIDA Holmes, MD;  Location: [facility] ENDOSCOPY;  Service: Endoscopy;  Laterality: N/A;   COLONOSCOPY WITH PROPOFOL  N/A 04/29/2021   Procedure: COLONOSCOPY WITH PROPOFOL ;  Surgeon: Dessa Reyes ORN, MD;  Location: [facility] ENDOSCOPY;  Service: Gastroenterology;  Laterality: N/A;   ESOPHAGOGASTRODUODENOSCOPY N/A 07/17/2019   Procedure: ESOPHAGOGASTRODUODENOSCOPY (EGD);  Surgeon: Janalyn Keene NOVAK, MD;  Location: [facility] ENDOSCOPY;  Service: Endoscopy;  Laterality: N/A;   ESOPHAGOGASTRODUODENOSCOPY N/A 12/28/2022    Procedure: ESOPHAGOGASTRODUODENOSCOPY (EGD);  Surgeon: Kriss Estefana DEL, DO;  Location: [facility];  Service: Gastroenterology;  Laterality: N/A;   ESOPHAGOGASTRODUODENOSCOPY (EGD) WITH PROPOFOL  N/A 04/29/2021   Procedure: ESOPHAGOGASTRODUODENOSCOPY (EGD) WITH PROPOFOL ;  Surgeon: Dessa Reyes ORN, MD;  Location: [facility] ENDOSCOPY;  Service: Gastroenterology;  Laterality: N/A;   ESOPHAGOGASTRODUODENOSCOPY (EGD) WITH PROPOFOL  N/A 09/21/2022   Procedure: ESOPHAGOGASTRODUODENOSCOPY (EGD) WITH PROPOFOL ;  Surgeon: Burnette Fallow, MD;  Location: [facility] ENDOSCOPY;  Service: Gastroenterology;  Laterality: N/A;   ESOPHAGOGASTRODUODENOSCOPY (EGD) WITH PROPOFOL  N/A 05/23/2023   Procedure: ESOPHAGOGASTRODUODENOSCOPY (EGD) WITH PROPOFOL ;  Surgeon: Burnette Fallow, MD;  Location: [facility] ENDOSCOPY;  Service: Gastroenterology;  Laterality: N/A;   GIVENS CAPSULE STUDY N/A 12/07/2023   Procedure: IMAGING PROCEDURE, GI TRACT, INTRALUMINAL, VIA CAPSULE;  Surgeon: Kriss Estefana DEL, DO;  Location: [facility] ENDOSCOPY;  Service: Gastroenterology;  Laterality: N/A;   IR KYPHO LUMBAR INC FX REDUCE BONE BX UNI/BIL CANNULATION INC/IMAGING  10/19/2022   IR RADIOLOGIST EVAL & MGMT  11/04/2022   KYPHOPLASTY     in lower back   REDUCTION MAMMAPLASTY Bilateral    SAVORY DILATION N/A 09/01/2015   Procedure: SAVORY DILATION;  Surgeon: Lamar ONEIDA Holmes, MD;  Location: [facility] ENDOSCOPY;  Service: Endoscopy;  Laterality: N/A;   TOTAL KNEE ARTHROPLASTY Right 10/31/2017   Procedure: RIGHT TOTAL KNEE ARTHROPLASTY;  Surgeon: Melodi Lerner, MD;  Location: [facility] ORS;  Service: Orthopedics;  Laterality: Right;   XI ROBOTIC ASSISTED PARAESOPHAGEAL HERNIA REPAIR N/A 08/01/2024   Procedure: REPAIR, HERNIA, PARAESOPHAGEAL, ROBOT-ASSISTED;  Surgeon: Sheldon Standing, MD;  Location: [facility] ORS;  Service: General;  Laterality: N/A;    Social History   Socioeconomic History   Marital status: Married    Spouse name: Not on file   Number of children: Not on  file   Years of education: Not on file   Highest education level: Not on file  Occupational History   Not on file  Tobacco Use   Smoking status: Never   Smokeless tobacco: Never  Vaping Use   Vaping status: Never Used  Substance and Sexual Activity   Alcohol use: Never   Drug use: Never   Sexual activity: Not Currently    Birth control/protection: None    Comment: intercourse age 14, less than 5 sexual partners  Other Topics Concern   Not on file  Social History Narrative   Not on file   Social Drivers of Health   Financial Resource Strain: Low Risk  (04/30/2024)   Received from [facility]   Overall Financial Resource Strain (CARDIA)    Difficulty of Paying Living Expenses: Not hard at all  Food Insecurity: No Food Insecurity (08/01/2024)   Hunger Vital Sign    Worried About Running Out of Food in the Last Year: Never true    Ran Out of Food in the Last Year: Never true  Transportation Needs: No Transportation Needs (08/01/2024)   PRAPARE - Administrator, Civil Service (Medical): No  Lack of Transportation (Non-Medical): No  Physical Activity: Not on file  Stress: Not on file  Social Connections: Moderately Integrated (08/01/2024)   Social Connection and Isolation Panel    Frequency of Communication with Friends and Family: More than three times a week    Frequency of Social Gatherings with Friends and Family: Once a week    Attends Religious Services: More than 4 times per year    Active Member of [employer] or Organizations: No    Attends Banker Meetings: Never    Marital Status: Married  Catering Manager Violence: Not At Risk (08/01/2024)   Humiliation, Afraid, Rape, and Kick questionnaire    Fear of Current or Ex-Partner: No    Emotionally Abused: No    Physically Abused: No    Sexually Abused: No    Family History  Problem Relation Age of Onset   Breast cancer Maternal Grandmother        in 70's   Heart disease Neg  Hx     Current Facility-Administered Medications  Medication Dose Route Frequency Provider Last Rate Last Admin   0.9 %  sodium chloride  infusion  250 mL Intravenous PRN Sheldon Standing, MD       0.9 %  sodium chloride  infusion  250 mL Intravenous PRN Sheldon Standing, MD       acetaminophen  (TYLENOL ) tablet 1,000 mg  1,000 mg Oral Q6H Leesa Leifheit, MD   1,000 mg at 08/03/24 0527   alum & mag hydroxide-simeth (MAALOX/MYLANTA) 200-200-20 MG/5ML suspension 30 mL  30 mL Oral Q6H PRN Sheldon Standing, MD       ferrous sulfate  tablet 325 mg  325 mg Oral Daily Sheldon Standing, MD   325 mg at 08/02/24 9085   And   ascorbic acid (VITAMIN C) tablet 250 mg  250 mg Oral Daily Sheldon Standing, MD   250 mg at 08/02/24 9085   bisacodyl  (DULCOLAX) suppository 10 mg  10 mg Rectal Daily PRN Sheldon Standing, MD       dexamethasone  (DECADRON ) injection 8 mg  8 mg Intravenous Q12H Dawnielle Christiana, MD   8 mg at 08/02/24 2111   diphenhydrAMINE  (BENADRYL ) 12.5 MG/5ML elixir 12.5 mg  12.5 mg Oral Q6H PRN Sheldon Standing, MD       Or   diphenhydrAMINE  (BENADRYL ) injection 12.5 mg  12.5 mg Intravenous Q6H PRN Sheldon Standing, MD       enoxaparin (LOVENOX) injection 30 mg  30 mg Subcutaneous Q24H Wofford, Drew A, RPH       feeding supplement (ENSURE SURGERY) liquid 237 mL  237 mL Oral BID BM Sheldon Standing, MD   237 mL at 08/02/24 1519   gabapentin (NEURONTIN) capsule 300 mg  300 mg Oral BID Sheldon Standing, MD   300 mg at 08/02/24 2111   HYDROmorphone  (DILAUDID ) injection 0.5-2 mg  0.5-2 mg Intravenous Q4H PRN Sheldon Standing, MD       lactated ringers  bolus 1,000 mL  1,000 mL Intravenous Q8H PRN Wylie Coon, Standing, MD       lactated ringers  infusion   Intravenous Q8H PRN Sheldon Standing, MD       magic mouthwash  15 mL Oral QID PRN Sheldon Standing, MD       magnesium hydroxide (MILK OF MAGNESIA) suspension 30 mL  30 mL Oral Daily PRN Sheldon Standing, MD       menthol  (CEPACOL) lozenge 3 mg  1 lozenge Oral PRN Sheldon Standing, MD       methocarbamol   (ROBAXIN )  injection 1,000 mg  1,000 mg Intravenous Q6H PRN Sheldon Standing, MD       methocarbamol  (ROBAXIN ) tablet 500 mg  500 mg Oral Q6H PRN Sheldon Standing, MD       metoprolol  tartrate (LOPRESSOR ) injection 5 mg  5 mg Intravenous Q6H PRN Sheldon Standing, MD       naphazoline-glycerin (CLEAR EYES REDNESS) ophth solution 1-2 drop  1-2 drop Both Eyes QID PRN Sheldon Standing, MD       ondansetron  (ZOFRAN -ODT) disintegrating tablet 4 mg  4 mg Oral Q6H PRN Sheldon Standing, MD       Or   ondansetron  (ZOFRAN ) injection 4 mg  4 mg Intravenous Q6H PRN Sheldon Standing, MD       phenol (CHLORASEPTIC) mouth spray 2 spray  2 spray Mouth/Throat PRN Sheldon Standing, MD       prochlorperazine (COMPAZINE) tablet 10 mg  10 mg Oral Q6H PRN Sheldon Standing, MD       Or   prochlorperazine (COMPAZINE) injection 5-10 mg  5-10 mg Intravenous Q6H PRN Sheldon Standing, MD       simethicone (MYLICON) chewable tablet 40 mg  40 mg Oral Q6H PRN Sheldon Standing, MD       simethicone (MYLICON) chewable tablet 80 mg  80 mg Oral QID Sheldon Standing, MD   80 mg at 08/02/24 2111   sodium chloride  (OCEAN) 0.65 % nasal spray 1-2 spray  1-2 spray Each Nare Q6H PRN Sheldon Standing, MD       sodium chloride  flush (NS) 0.9 % injection 3 mL  3 mL Intravenous Q12H Cornie Herrington, MD   3 mL at 08/02/24 2116   sodium chloride  flush (NS) 0.9 % injection 3 mL  3 mL Intravenous PRN Sheldon Standing, MD       sodium chloride  flush (NS) 0.9 % injection 3 mL  3 mL Intravenous Q12H Rapheal Masso, MD   3 mL at 08/02/24 2116   sodium chloride  flush (NS) 0.9 % injection 3 mL  3 mL Intravenous PRN Sheldon Standing, MD       sucralfate  (CARAFATE ) 1 GM/10ML suspension 1 g  1 g Oral TID WC & HS Sheldon Standing, MD   1 g at 08/02/24 2110   traMADol  (ULTRAM ) tablet 50-100 mg  50-100 mg Oral Q6H PRN Sheldon Standing, MD       verapamil (CALAN-SR) CR tablet 120 mg  120 mg Oral QHS Jamia Hoban, MD   120 mg at 08/02/24 2111     Allergies  Allergen Reactions   Sulfa Antibiotics  Anaphylaxis and Swelling   Dexlansoprazole Other (See Comments)    Syncope    Ace Inhibitors Cough   Oxycodone  Nausea Only   Penicillins Rash    Has patient had a PCN reaction causing immediate rash, facial/tongue/throat swelling, SOB or lightheadedness with hypotension: No Has patient had a PCN reaction causing severe rash involving mucus membranes or skin necrosis: No Has patient had a PCN reaction that required hospitalization: No Has patient had a PCN reaction occurring within the last 10 years: No If all of the above answers are NO, then may proceed with Cephalosporin use.     Signed:   Standing KYM Sheldon, MD, FACS, MASCRS Esophageal, Gastrointestinal & Colorectal Surgery Robotic and Minimally Invasive Surgery  [facility] [address], [address] [place], [place] [address] [phone] Fax [phone] Main  CONTACT INFORMATION: Weekday (9AM-5PM): Call [facility] main office at [phone] Weeknight (5PM-9AM) or  Weekend/Holiday: Check EPIC Web Links tab & use AMION (password  [email or web address]) for General Surgery [facility] coverage  Please, DO NOT use SecureChat  (it is not reliable communication to reach operating surgeons & will lead to a delay in care).   Epic staff messaging available for outpatient concerns needing 1-2 business day response.      08/03/2024, 7:55 AM

## 2024-08-03 NOTE — Evaluation (Signed)
 Occupational Therapy Evaluation Patient Details Name: Cheryl Goodman MRN: [id number] DOB: 11/11/1940 Today's Date: 08/03/2024   History of Present Illness   Pt is 83 yo female admitted with paraesophageal hiatal hernia and s/p robotic reduction of hernia on 08/02/24.  Pt with hx including but not limited to CKD, GIB, CVA, OSA, OA, OP, R TKA     Clinical Impressions Pt was seen for OT evaluation this date. Prior to hospital admission, pt was modified independent with DME at home, not use consistently. Pt lives with her husband who is able to provide 24 hour care as needed. They live in two level home with 4 steps to enter, able to live on the main floor. Pt presents with minimal deficits balance, functional mobility/transfers, activity tolerance, and safety awareness affecting safe and optimal ADL completion. Pt currently requires CGA for safety throughout multiple STS during UB/LB bathing and dressing post bowel incontinent at bed level. 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 Pt would benefit from skilled OT services to address noted impairments and functional limitations (see below for any additional details) in order to maximize safety and independence while minimizing future risk of falls, injury, and readmission. Do not anticipate the need for follow up OT services upon acute hospital DC.   If plan is discharge home, recommend the following:   A little help with walking and/or transfers;Assist for transportation;Help with stairs or ramp for entrance     Functional Status Assessment   Patient has had a recent decline in their functional status and demonstrates the ability to make significant improvements in function in a reasonable and predictable amount of time.     Equipment Recommendations   None recommended by OT     Recommendations for Other Services          Precautions/Restrictions   Precautions Precautions: Fall Recall of Precautions/Restrictions: Intact Precaution/Restrictions Comments: JP drain     Mobility Bed Mobility Overal bed mobility: Modified Independent             General bed mobility comments: Increased time to complete, no physical support    Transfers Overall transfer level: Needs assistance Equipment used: Rolling walker (2 wheels) Transfers: Sit to/from Stand, Bed to chair/wheelchair/BSC Sit to Stand: Contact guard assist     Step pivot transfers: Contact guard assist     General transfer comment: CGA with use of RW throughout t/r, no LOB noted      Balance Overall balance assessment: Needs assistance Sitting-balance support: No upper extremity supported Sitting balance-Leahy Scale: Good Sitting balance - Comments: Steady reaching outside BOS during LB dressing   Standing balance support: No upper extremity supported, Bilateral upper extremity supported Standing balance-Leahy Scale: Fair Standing balance comment: RW to ambulate but could stand without UE support                           ADL either performed or assessed with clinical judgement   ADL Overall ADL's : Needs assistance/impaired Eating/Feeding: Modified independent;Sitting   Grooming: Wash/dry face;Set up;Brushing hair;Sitting   Upper Body Bathing: Standing;Contact guard assist   Lower Body Bathing: Moderate assistance;Sit to/from stand   Upper Body Dressing : Contact guard assist;Standing   Lower Body Dressing: Contact guard assist;Sit to/from stand   Toilet Transfer: Ambulation;BSC/3in1;Rolling walker (2 wheels);Cueing for safety;Contact guard assist   Toileting- Clothing Manipulation and Hygiene: Supervision/safety;Sitting/lateral  lean       Functional mobility during ADLs: Contact guard assist;Rolling walker (2 wheels) General ADL Comments: Hand placement on RW and STS technique from [facility]                                 Pertinent Vitals/Pain Pain Assessment Pain Assessment: 0-10 Pain Score: 2  Pain Location: abdomen Pain Descriptors / Indicators: Discomfort, Sore Pain Intervention(s): Limited activity within patient's tolerance, Monitored during session, Repositioned     Extremity/Trunk Assessment Upper Extremity Assessment Upper Extremity Assessment: Overall WFL for tasks assessed   Lower Extremity Assessment Lower Extremity Assessment: Defer to PT evaluation   Cervical / Trunk Assessment Cervical / Trunk Assessment: Normal   Communication Communication Communication: No apparent difficulties   Cognition Arousal: Alert Behavior During Therapy: WFL for tasks assessed/performed Cognition: No apparent impairments             OT - Cognition Comments: A/Ox4                 Following commands: Intact       Cueing  General Comments   Cueing Techniques: Verbal cues  Pt reports incont. bowel movement at 10pm 10/30, made staff aware and didn't recieve assistance to get cleaned up until time of eval 9:00am 10/31, when pt made therapist aware.   Exercises Exercises: Other exercises Other Exercises Other Exercises: Edu: Role of OT session, acute OT needs, fall prevention, DME use when return home        Home Living Family/patient expects to be discharged to:: Private residence Living Arrangements: Spouse/significant other Available Help at Discharge: Family;Available 24 hours/day Type of Home: House Home Access: Stairs to enter [employer] of Steps: 4 Entrance Stairs-Rails: Right Home Layout: Two level;Able to live on main level with bedroom/bathroom     Bathroom Shower/Tub: Producer, Television/film/video: Handicapped height Bathroom Accessibility: Yes How Accessible: Accessible via walker Home Equipment: Rollator (4 wheels);Rolling Walker (2 wheels);Cane - single point;Shower seat   Additional Comments: Does have step stool to  get on bed - can hold bed rail for assist      Prior Functioning/Environment Prior Level of Function : Independent/Modified Independent;Driving             Mobility Comments: Can ambulate in community without ad ADLs Comments: independent adls and iadls    OT Problem List: Decreased strength;Decreased activity tolerance;Impaired balance (sitting and/or standing);Decreased coordination;Decreased safety awareness;Decreased knowledge of use of DME or AE;Decreased knowledge of precautions   OT Treatment/Interventions: Self-care/ADL training;Therapeutic exercise;Energy conservation;DME and/or AE instruction;Therapeutic activities;Patient/family education;Balance training      OT Goals(Current goals can be found in the care plan section)   Acute Rehab OT Goals Patient Stated Goal: Return home soon OT Goal Formulation: With patient Time For Goal Achievement: 08/17/24 Potential to Achieve Goals: Good ADL Goals Pt Will Perform Grooming: with modified independence;standing Pt Will Perform Lower Body Dressing: with modified independence;sitting/lateral leans Pt Will Transfer to Toilet: with modified independence;ambulating Pt Will Perform Toileting - Clothing Manipulation and hygiene: with modified independence;sit to/from stand   OT Frequency:  Min 1X/week                  AM-PAC OT 6 Clicks Daily Activity     Outcome Measure Help from another person eating meals?: None Help from another person taking care of personal grooming?: None Help from another person toileting, which includes using  toliet, bedpan, or urinal?: A Little Help from another person bathing (including washing, rinsing, drying)?: A Little Help from another person to put on and taking off regular upper body clothing?: None Help from another person to put on and taking off regular lower body clothing?: None 6 Click Score: 22   End of Session Equipment Utilized During Treatment: Rolling walker (2  wheels) Nurse Communication: Mobility status  Activity Tolerance: Patient tolerated treatment well Patient left: in chair;with call bell/phone within reach;with family/visitor present  OT Visit Diagnosis: Unsteadiness on feet (R26.81);Other abnormalities of gait and mobility (R26.89);Muscle weakness (generalized) (M62.81);Repeated falls (R29.6)                Time: 0900-0930 OT Time Calculation (min): 30 min Charges:  OT General Charges $OT Visit: 1 Visit OT Evaluation $OT Eval Low Complexity: 1 Low OT Treatments $Self Care/Home Management : 8-22 mins  Larraine Colas M.S. OTR/L  08/03/24, 9:51 AM

## 2024-08-03 NOTE — TOC Transition Note (Signed)
 Transition of Care [facility]) - Discharge Note   Patient Details  Name: LEVERN KALKA MRN: [id number] Date of Birth: 09/30/1941  [facility] CM/SW Contact:  NORMAN ASPEN, LCSW Phone Number: 08/03/2024, 11:34 AM   Clinical Narrative:     Spoke with pt this morning to review that MD had placed orders for HHPT/OT, however, pt declines the services.  No IP CM needs.   Final next level of care: Home/Self Care Barriers to Discharge: No Barriers Identified   Patient Goals and CMS Choice Patient states their goals for this hospitalization and ongoing recovery are:: return home          Discharge Placement                       Discharge Plan and Services Additional resources added to the After Visit Summary for                  DME Arranged: N/A DME Agency: NA                  Social Drivers of Health (SDOH) Interventions SDOH Screenings   Food Insecurity: No Food Insecurity (08/01/2024)  Housing: Low Risk  (08/01/2024)  Transportation Needs: No Transportation Needs (08/01/2024)  Utilities: Not At Risk (08/01/2024)  Depression (PHQ2-9): Low Risk  (01/11/2023)  Financial Resource Strain: Low Risk  (04/30/2024)   Received from [facility]  Social Connections: Moderately Integrated (08/01/2024)  Tobacco Use: Low Risk  (08/01/2024)     Readmission Risk Interventions    12/09/2023    1:17 PM 05/23/2023   10:10 AM  Readmission Risk Prevention Plan  Transportation Screening Complete Complete  PCP or Specialist Appt within 5-7 Days Complete Complete  Home Care Screening Complete Complete  Medication Review (RN CM) Complete Complete

## 2024-08-03 NOTE — Progress Notes (Signed)
 All questions and concerns answer prior to discharge, patient will be leaving hospital via wheelchair with nurse

## 2024-08-03 NOTE — Plan of Care (Signed)
 Nutrition Education Note  RD consulted for nutrition education regarding diet instructions s/p Nissen (pureed diet for 2 weeks post-op).    RD provided IDDSI Level 4 Pureed Dori) Nutrition Therapy handout from the Academy of [employer]. Provided examples on ways to modify foods on pureed diet. Encouraged pt to blend foods using a blender or food processor and strain to obtain desired consistency (no lumps). Also encouraged use of commercial pureed foods and oral nutritional supplements such as Ensure to provide adequate calories and protein. Discussed how to modify recipes to add calories and protein.    RD discussed why it is important for patient to adhere to diet recommendations and discussed possibility of diet advancement upon MD follow-up. Teach back method used.   Expect good compliance.   Body mass index is 32.78 kg/m. Pt meets criteria for Obesity  based on current BMI.   Current diet order is DYS 1 (Pureed).Labs and medications reviewed. No further nutrition interventions warranted at this time. RD contact information provided. If additional nutrition issues arise, please re-consult RD.   Trude Ned RD, LDN Contact via [employer].

## 2024-08-08 ENCOUNTER — Ambulatory Visit: Attending: Emergency Medicine

## 2024-08-23 DIAGNOSIS — R197 Diarrhea, unspecified: Secondary | ICD-10-CM | POA: Diagnosis not present

## 2024-08-23 DIAGNOSIS — D509 Iron deficiency anemia, unspecified: Secondary | ICD-10-CM | POA: Diagnosis not present

## 2024-08-23 DIAGNOSIS — D500 Iron deficiency anemia secondary to blood loss (chronic): Secondary | ICD-10-CM | POA: Diagnosis not present

## 2024-08-23 DIAGNOSIS — E860 Dehydration: Secondary | ICD-10-CM | POA: Diagnosis not present

## 2024-08-23 DIAGNOSIS — R5383 Other fatigue: Secondary | ICD-10-CM | POA: Diagnosis not present

## 2024-10-17 ENCOUNTER — Ambulatory Visit (HOSPITAL_COMMUNITY): Admit: 2024-10-17 | Admitting: Gastroenterology

## 2024-10-17 ENCOUNTER — Encounter (HOSPITAL_COMMUNITY): Payer: Self-pay

## 2024-10-17 SURGERY — MANOMETRY, ESOPHAGUS
